# Patient Record
Sex: Male | Born: 1960 | Race: Black or African American | Hispanic: No | Marital: Married | Smoking: Never smoker
Health system: Southern US, Community
[De-identification: ages and names within clinical notes are randomized; demographics above are authoritative.]

## PROBLEM LIST (undated history)

## (undated) DIAGNOSIS — K219 Gastro-esophageal reflux disease without esophagitis: Secondary | ICD-10-CM

## (undated) DIAGNOSIS — E785 Hyperlipidemia, unspecified: Secondary | ICD-10-CM

## (undated) DIAGNOSIS — M1990 Unspecified osteoarthritis, unspecified site: Secondary | ICD-10-CM

## (undated) DIAGNOSIS — I4891 Unspecified atrial fibrillation: Secondary | ICD-10-CM

## (undated) DIAGNOSIS — I10 Essential (primary) hypertension: Secondary | ICD-10-CM

## (undated) HISTORY — DX: Essential (primary) hypertension: I10

## (undated) HISTORY — PX: HERNIA REPAIR: SHX51

## (undated) HISTORY — PX: KNEE ARTHROSCOPY: SUR90

## (undated) HISTORY — DX: Unspecified atrial fibrillation: I48.91

---

## 2000-04-20 ENCOUNTER — Emergency Department (HOSPITAL_COMMUNITY): Admission: EM | Admit: 2000-04-20 | Discharge: 2000-04-20 | Payer: Self-pay | Admitting: Emergency Medicine

## 2000-07-19 ENCOUNTER — Ambulatory Visit (HOSPITAL_COMMUNITY): Admission: RE | Admit: 2000-07-19 | Discharge: 2000-07-19 | Payer: Self-pay | Admitting: Family Medicine

## 2000-07-19 ENCOUNTER — Encounter: Payer: Self-pay | Admitting: Family Medicine

## 2000-07-21 ENCOUNTER — Encounter (HOSPITAL_COMMUNITY): Admission: RE | Admit: 2000-07-21 | Discharge: 2000-08-20 | Payer: Self-pay | Admitting: Family Medicine

## 2001-05-16 ENCOUNTER — Ambulatory Visit (HOSPITAL_COMMUNITY): Admission: RE | Admit: 2001-05-16 | Discharge: 2001-05-16 | Payer: Self-pay | Admitting: Family Medicine

## 2001-05-16 ENCOUNTER — Encounter: Payer: Self-pay | Admitting: Family Medicine

## 2001-06-02 ENCOUNTER — Ambulatory Visit (HOSPITAL_COMMUNITY): Admission: RE | Admit: 2001-06-02 | Discharge: 2001-06-02 | Payer: Self-pay | Admitting: Orthopaedic Surgery

## 2001-06-05 ENCOUNTER — Encounter (HOSPITAL_COMMUNITY): Admission: RE | Admit: 2001-06-05 | Discharge: 2001-07-05 | Payer: Self-pay | Admitting: Orthopaedic Surgery

## 2001-07-05 ENCOUNTER — Encounter (HOSPITAL_COMMUNITY): Admission: RE | Admit: 2001-07-05 | Discharge: 2001-08-04 | Payer: Self-pay | Admitting: Orthopaedic Surgery

## 2001-10-11 ENCOUNTER — Ambulatory Visit (HOSPITAL_COMMUNITY): Admission: RE | Admit: 2001-10-11 | Discharge: 2001-10-11 | Payer: Self-pay | Admitting: Family Medicine

## 2001-10-11 ENCOUNTER — Encounter: Payer: Self-pay | Admitting: Family Medicine

## 2001-11-01 ENCOUNTER — Encounter: Payer: Self-pay | Admitting: Family Medicine

## 2001-11-01 ENCOUNTER — Ambulatory Visit (HOSPITAL_COMMUNITY): Admission: RE | Admit: 2001-11-01 | Discharge: 2001-11-01 | Payer: Self-pay | Admitting: Family Medicine

## 2002-08-02 ENCOUNTER — Ambulatory Visit (HOSPITAL_BASED_OUTPATIENT_CLINIC_OR_DEPARTMENT_OTHER): Admission: RE | Admit: 2002-08-02 | Discharge: 2002-08-02 | Payer: Self-pay | Admitting: Urology

## 2002-08-16 ENCOUNTER — Encounter: Payer: Self-pay | Admitting: Urology

## 2002-08-16 ENCOUNTER — Ambulatory Visit (HOSPITAL_COMMUNITY): Admission: RE | Admit: 2002-08-16 | Discharge: 2002-08-16 | Payer: Self-pay | Admitting: Urology

## 2002-09-08 ENCOUNTER — Emergency Department (HOSPITAL_COMMUNITY): Admission: EM | Admit: 2002-09-08 | Discharge: 2002-09-08 | Payer: Self-pay | Admitting: Emergency Medicine

## 2003-06-03 ENCOUNTER — Emergency Department (HOSPITAL_COMMUNITY): Admission: EM | Admit: 2003-06-03 | Discharge: 2003-06-03 | Payer: Self-pay | Admitting: Emergency Medicine

## 2006-07-22 ENCOUNTER — Ambulatory Visit (HOSPITAL_COMMUNITY): Admission: RE | Admit: 2006-07-22 | Discharge: 2006-07-22 | Payer: Self-pay | Admitting: Family Medicine

## 2007-04-14 ENCOUNTER — Ambulatory Visit (HOSPITAL_COMMUNITY): Admission: RE | Admit: 2007-04-14 | Discharge: 2007-04-14 | Payer: Self-pay | Admitting: Family Medicine

## 2009-07-09 ENCOUNTER — Ambulatory Visit (HOSPITAL_COMMUNITY): Admission: RE | Admit: 2009-07-09 | Discharge: 2009-07-09 | Payer: Self-pay | Admitting: Family Medicine

## 2010-06-19 NOTE — Op Note (Signed)
[facility]  Patient:    Christopher [name], Christopher [name] Visit Number: [id number] MRN: [id number]          Service Type: REC Location: [facility] Attending Physician:  Windle Guard Dictated by:   Darreld Mclean, M.D. Proc. Date: 06/02/01 Admit Date:  06/05/2001                             Operative Report  PREOPERATIVE DIAGNOSIS:  Torn medial meniscus, right knee.  POSTOPERATIVE DIAGNOSES:  Torn medial meniscus, right knee, degenerative joint disease medial femoral condyle.  PROCEDURE:  Operative arthroscopy, right knee, partial medial meniscectomy.  ANESTHESIA:  General.  TOURNIQUET TIME:  23 minutes.  DRAINS: None.  SURGEON:  Darreld Mclean, M.D.  ASSISTANT:  Candace Cruise, P.A.-C.  INDICATIONS FOR PROCEDURE:  The patient is a 50 year old African-American male with pain and tenderness in his right knee giving way to pain and tenderness. MRI was positive for tear of the medial meniscus. He has not responded well to conservative treatment. Surgery was recommended and he appeared to understand and agree to the procedure as outlined.  OPERATIVE FINDINGS:  Once the knee was encountered, there were multiple small little loose particles within the knee, wattage material that was expressed when we put the inflow arthroscope in. Medially he had a tear of the medial meniscus posterior horn with significant degenerative changes in the femoral condyle medially, grade 3 changes. There was a defect in the articular surface of the femoral condyle. The tibial plateau looked good with only grade 2 changes, the anterior cruciate was intact, laterally looked normal. The meniscus looked normal and articular surfaces looked normal. There were multiple small little particles within the knee and these were removed with suction and continuous irrigation.  DESCRIPTION OF PROCEDURE:  The patient was given general anesthesia, laid supine on the operating room table. The right leg was  elevated, wrapped circumferentially with an Esmarch bandage, tourniquet inflated to 300 mmHg. Esmarch bandage removed. The inflow cannula was inserted in the medial portion of the right knee and lactated Ringers instilled into the knee. The arthroscope was inserted laterally. There were multiple small particles coming out, so with the knee entirely inflated with the lactated Ringers, I then let open the portal and let these multiple small particles come out through the larger drain. Once this was done, put the scope in and looked at the knee. Please see the findings above. ______ were taken.  Attention directed to the medial side of the knee and using a meniscal shaver punch, good smooth contour was obtained for the tear. Multiple small particles in the knee subsided as we kept irrigating the knee. The knee was systematically reexamined and no new pathology found. The arthroscope was removed. Marcaine 0.25% was instilled in each portal after each portal was reapproximated using 3-0 nylon in interrupted vertical mattress manner. A sterile dressing applied, bulky dressing, thigh tourniquet inflated for 23 minutes. The patient tolerated the procedure well and went to recovery in good condition. Appropriate analgesia given for pain. I will see in the office in approximately 10 days to 2 weeks. Physical therapy has been arranged. For any difficulties, he is to contact me through the office or hospital beeper system. Dictated by:   Darreld Mclean, M.D. Attending Physician:  Windle Guard DD:  06/02/01 TD:  06/05/01 Job: [id number]

## 2010-06-19 NOTE — Op Note (Signed)
   NAME:  ANGAD, NABERS                         ACCOUNT NO.:  [id number]   MEDICAL RECORD NO.:  [id number]                   PATIENT TYPE:  AMB   LOCATION:  [facility]                                 FACILITY:  [facility]   PHYSICIAN:  Sigmund I. Patsi Sears, M.D.         DATE OF BIRTH:  10/08/1960   DATE OF PROCEDURE:  08/02/2002  DATE OF DISCHARGE:                                 OPERATIVE REPORT   PREOPERATIVE DIAGNOSES:  1. Phimosis.  2. Balanitis.   POSTOPERATIVE DIAGNOSES:  1. Phimosis.  2. Balanitis.   OPERATION:  Circumcision.   SURGEON:  Sigmund I. Patsi Sears, M.D.   ANESTHESIA:  LMA.   PREPARATION:  After appropriate preanesthesia the patient was brought to the  operating room and placed on the operating table in a dorsal supine  position, where general LMA anesthesia was introduced.  He remained in this  position, where the penis was prepped with Betadine solution and draped in  the usual fashion.   REVIEW OF HISTORY:  Mr. Kaigler is a 50 year old black male, seen with  phimosis and balanitis and treated in the office.  He now presents for a  circumcision.   PROCEDURE:  Approximately 20 mL of Marcaine 0.25% plain is injected  circumferentially around the base of the penis at the beginning of the case.  Using a marking pen, outline of the periglandular and pericoronal incisions  is made, and each incision is then made with a knife blade.  Following  removal of the foreskin, cautery is used to obtain hemostasis and following  this, four separate quadrant sutures of 4-0 Monocryl are placed, and each  quadrant is closed with interrupted 4-0 Monocryl suture.  Following this, a  double sterile dressing is applied and the patient is awakened and taken to  the recovery room in good condition, after receiving IV Toradol.  He  tolerated the procedure well and is allowed to be discharged in good  condition.                                               Sigmund I. Patsi Sears,  M.D.    SIT/MEDQ  D:  08/02/2002  T:  08/02/2002  Job:  [id number]

## 2010-11-19 ENCOUNTER — Emergency Department (HOSPITAL_COMMUNITY)
Admission: EM | Admit: 2010-11-19 | Discharge: 2010-11-19 | Disposition: A | Discharge status: Home / Self Care | Payer: BC Managed Care – PPO | Attending: Emergency Medicine | Admitting: Emergency Medicine

## 2010-11-19 ENCOUNTER — Emergency Department (HOSPITAL_COMMUNITY): Payer: BC Managed Care – PPO

## 2010-11-19 ENCOUNTER — Encounter: Payer: Self-pay | Admitting: *Deleted

## 2010-11-19 DIAGNOSIS — M25519 Pain in unspecified shoulder: Secondary | ICD-10-CM

## 2010-11-19 MED ORDER — HYDROCODONE-ACETAMINOPHEN 5-325 MG PO TABS: 1.0000 | ORAL_TABLET | Freq: Four times a day (QID) | ORAL | Status: AC | PRN

## 2010-11-19 MED ORDER — IBUPROFEN 800 MG PO TABS
800.0000 mg | ORAL_TABLET | Freq: Once | ORAL | Status: AC
  Administered 2010-11-19: 800 mg via ORAL
  Filled 2010-11-19: qty 1

## 2010-11-19 MED ORDER — HYDROCODONE-ACETAMINOPHEN 5-325 MG PO TABS
1.0000 | ORAL_TABLET | Freq: Once | ORAL | Status: AC
  Administered 2010-11-19: 1 via ORAL
  Filled 2010-11-19: qty 1

## 2010-11-19 NOTE — ED Notes (Signed)
C/o right-sided neck pain, right shoulder pain radiating down right arm x 5 days; worse with movement.

## 2010-11-19 NOTE — ED Provider Notes (Signed)
Medical screening examination/treatment/procedure(s) were performed by non-physician practitioner and as supervising physician I was immediately available for consultation/collaboration.   Benny Lennert, MD 11/19/10 2256

## 2010-11-19 NOTE — ED Provider Notes (Signed)
History     CSN: [id number] Arrival date & time: 11/19/2010  8:16 PM   First MD Initiated Contact with Patient 11/19/10 2026      Chief Complaint  Patient presents with  . Shoulder Pain    right shoulder    (Consider location/radiation/quality/duration/timing/severity/associated sxs/prior treatment) HPI Comments: No known injury.  No known problem with his R shoulder.  Describes a "numbness and throbbing".  Patient is a 50 y.o. male presenting with shoulder pain. The history is provided by the patient. No language interpreter was used.  Shoulder Pain This is a new problem. The current episode started in the past 7 days. The problem occurs constantly. The problem has been gradually worsening. Pertinent negatives include no coughing, fever, nausea or vomiting. Exacerbated by: palpation and active ROM. He has tried nothing for the symptoms.    History reviewed. No pertinent past medical history.  Past Surgical History  Procedure Date  . Knee arthroscopy right knee    No family history on file.  History  Substance Use Topics  . Smoking status: Never Smoker   . Smokeless tobacco: Not on file  . Alcohol Use: No      Review of Systems  Constitutional: Negative for fever.  Respiratory: Negative for cough, chest tightness and shortness of breath.   Gastrointestinal: Negative for nausea and vomiting.  Musculoskeletal:       Shoulder pain  All other systems reviewed and are negative.    Allergies  Review of patient's allergies indicates no known allergies.  Home Medications   Current Outpatient Rx  Name Route Sig Dispense Refill  . ATENOLOL 25 MG PO TABS Oral Take 12.5 mg by mouth 2 (two) times daily.        BP 150/79  Pulse 62  Temp(Src) 98.3 F (36.8 C) (Oral)  Resp 16  Ht 5\' 11"  (1.803 m)  Wt 185 lb (83.915 kg)  BMI 25.80 kg/m2  SpO2 100%  Physical Exam  Nursing note and vitals reviewed. Constitutional: [name] oriented to person, place, and time. He  appears well-developed and well-nourished.  HENT:  Head: Normocephalic and atraumatic.  Eyes: EOM are normal.  Neck: Normal range of motion.  Cardiovascular: Normal rate, regular rhythm, normal heart sounds and intact distal pulses.   Pulmonary/Chest: Effort normal and breath sounds normal. No respiratory distress.  Abdominal: Soft. He exhibits no distension. There is no tenderness.  Musculoskeletal: He exhibits tenderness.       Right shoulder: He exhibits decreased range of motion, tenderness and pain. He exhibits no bony tenderness, no swelling, no effusion, no crepitus, no deformity, no laceration, no spasm, normal pulse and normal strength.       Arms: Neurological: [name] alert and oriented to person, place, and time.  Skin: Skin is warm and dry.  Psychiatric: He has a normal mood and affect. Judgment normal.    ED Course  Procedures (including critical care time)  Labs Reviewed - No data to display No results found.   No diagnosis found.    MDM  Discussed xray findings with pt and wife.  Pt is to wear the sling for comfort.  Apply ice several times daily, and take ibuprofen up to 800 mg  TID with food.  He will f/u with dr. Romeo Apple.        Worthy Rancher, PA 11/19/10 2152

## 2010-11-19 NOTE — ED Notes (Signed)
Pt already seen and assessed by EDPa.

## 2010-11-20 ENCOUNTER — Other Ambulatory Visit: Payer: Self-pay | Admitting: Family Medicine

## 2010-11-20 ENCOUNTER — Ambulatory Visit (HOSPITAL_COMMUNITY)
Admission: RE | Admit: 2010-11-20 | Discharge: 2010-11-20 | Disposition: A | Discharge status: Home / Self Care | Payer: BC Managed Care – PPO | Source: Ambulatory Visit | Attending: Family Medicine | Admitting: Family Medicine

## 2010-11-20 DIAGNOSIS — R52 Pain, unspecified: Secondary | ICD-10-CM

## 2010-11-20 DIAGNOSIS — M5030 Other cervical disc degeneration, unspecified cervical region: Secondary | ICD-10-CM | POA: Insufficient documentation

## 2010-11-20 DIAGNOSIS — M542 Cervicalgia: Secondary | ICD-10-CM | POA: Insufficient documentation

## 2011-02-09 ENCOUNTER — Other Ambulatory Visit: Payer: Self-pay | Admitting: Family Medicine

## 2011-02-09 DIAGNOSIS — M25511 Pain in right shoulder: Secondary | ICD-10-CM

## 2011-02-18 ENCOUNTER — Other Ambulatory Visit (HOSPITAL_COMMUNITY): Payer: BC Managed Care – PPO

## 2011-03-08 ENCOUNTER — Encounter: Payer: Self-pay | Admitting: Orthopedic Surgery

## 2011-03-08 ENCOUNTER — Ambulatory Visit (INDEPENDENT_AMBULATORY_CARE_PROVIDER_SITE_OTHER): Payer: BC Managed Care – PPO | Admitting: Orthopedic Surgery

## 2011-03-08 VITALS — BP 140/80 | Ht 71.0 in | Wt 186.0 lb

## 2011-03-08 DIAGNOSIS — M25819 Other specified joint disorders, unspecified shoulder: Secondary | ICD-10-CM

## 2011-03-08 DIAGNOSIS — M25519 Pain in unspecified shoulder: Secondary | ICD-10-CM

## 2011-03-08 DIAGNOSIS — M7541 Impingement syndrome of right shoulder: Secondary | ICD-10-CM

## 2011-03-08 NOTE — Patient Instructions (Signed)
You have received a steroid shot. 15% of patients experience increased pain at the injection site with in the next 24 hours. This is best treated with ice and tylenol extra strength 2 tabs every 8 hours. If you are still having pain please call the office.   Start home exercise program with 3 level bands (obtained from [employer]. Approximately $15)  Do the exercises for 6 weeks start with the low resistance band for 2 weeks and then the  medium for 2 weeks, finally  the high resistance band for 2 weeks  Take the medication as needed

## 2011-03-08 NOTE — Progress Notes (Signed)
  Subjective:    Christopher [name] is a 51 y.o. male who presents with right shoulder pain. The symptoms began several months ago. Aggravating factors: no known event. Pain is located around the deltoid . Discomfort is described as sharp/stabbing. Symptoms are exacerbated by repetitive movements, overhead movements and lying on the shoulder. Evaluation to date: C spine film: DDDC5-6. Therapy to date includes: nothing specific.vicodin, nsaid-voltaren, percocet x 2-3 months   The following portions of the patient's history were reviewed and updated as appropriate: allergies, current medications, past family history, past medical history, past social history, past surgical history and problem list.  Review of Systems A comprehensive review of systems was negative except for: Musculoskeletal: positive for myalgias   Objective:    BP 140/80  Ht 5\' 11"  (1.803 m)  Wt 186 lb (84.369 kg)  BMI 25.94 kg/m2  Physical Exam(12) GENERAL: normal development   CDV: pulses are normal   Skin: normal  Lymph: nodes were not palpable/normal  Psychiatric: awake, alert and oriented  Neuro: normal sensation  Lower extremity exam  Ambulation is normal.  Lower extremities.  Gait is normal.  There is no evidence of contracture, atrophy, subluxation, deformity.  Right shoulder: Inspection reveals no tenderness around the a.c. Joint or the glenohumeral joint.  The there is no swelling.  Range of motion external rotation is limited to 40 with his arm at his side.  Active forward elevation is limited to 110, passive range of motion 130.  Painful arc of motion 110-130.  Gross manual muscle testing however reveals internal and external rotation strength of 5 grade.  Forward elevation strength grade 5.  Positive impingement sign is noted at 120.  The shoulder is otherwise stable.  Left shoulder: normal active ROM, no tenderness, no impingement sign     Assessment:    Right impingement syndrome     Plan:      Natural history and expected course discussed. Questions answered. Agricultural engineer distributed. Gentle ROM exercises. Shoulder injection. See procedure note. Home exercise program   Shoulder Injection Procedure Note  Shoulder Injection Procedure Note   Pre-operative Diagnosis: right  RC Syndrome  Post-operative Diagnosis: same  Indications: pain   Anesthesia: ethyl chloride   Procedure Details   Verbal consent was obtained for the procedure. The shoulder was prepped withalcohol and the skin was anesthetized. A 20 gauge needle was advanced into the subacromial space through posterior approach without difficulty  The space was then injected with 3 ml 1% lidocaine and 1 ml of depomedrol. The injection site was cleansed with isopropyl alcohol and a dressing was applied.  Complications:  None; patient tolerated the procedure well.

## 2011-03-09 ENCOUNTER — Other Ambulatory Visit: Payer: Self-pay | Admitting: *Deleted

## 2011-03-09 MED ORDER — HYDROCODONE-ACETAMINOPHEN 7.5-325 MG PO TABS: 1.0000 | ORAL_TABLET | Freq: Four times a day (QID) | ORAL | Status: AC | PRN

## 2011-07-25 ENCOUNTER — Emergency Department (HOSPITAL_COMMUNITY)
Admission: EM | Admit: 2011-07-25 | Discharge: 2011-07-25 | Disposition: A | Discharge status: Home / Self Care | Payer: BC Managed Care – PPO | Attending: Emergency Medicine | Admitting: Emergency Medicine

## 2011-07-25 ENCOUNTER — Encounter (HOSPITAL_COMMUNITY): Payer: Self-pay | Admitting: Emergency Medicine

## 2011-07-25 DIAGNOSIS — W57XXXA Bitten or stung by nonvenomous insect and other nonvenomous arthropods, initial encounter: Secondary | ICD-10-CM | POA: Insufficient documentation

## 2011-07-25 DIAGNOSIS — Z809 Family history of malignant neoplasm, unspecified: Secondary | ICD-10-CM | POA: Insufficient documentation

## 2011-07-25 DIAGNOSIS — Z833 Family history of diabetes mellitus: Secondary | ICD-10-CM | POA: Insufficient documentation

## 2011-07-25 DIAGNOSIS — I10 Essential (primary) hypertension: Secondary | ICD-10-CM | POA: Insufficient documentation

## 2011-07-25 DIAGNOSIS — S30860A Insect bite (nonvenomous) of lower back and pelvis, initial encounter: Secondary | ICD-10-CM | POA: Insufficient documentation

## 2011-07-25 DIAGNOSIS — Z8261 Family history of arthritis: Secondary | ICD-10-CM | POA: Insufficient documentation

## 2011-07-25 MED ORDER — BACITRACIN ZINC 500 UNIT/GM EX OINT
TOPICAL_OINTMENT | CUTANEOUS | Status: AC
  Filled 2011-07-25: qty 0.9

## 2011-07-25 MED ORDER — BACITRACIN ZINC 500 UNIT/GM EX OINT
1.0000 "application " | TOPICAL_OINTMENT | Freq: Two times a day (BID) | CUTANEOUS | Status: DC
  Administered 2011-07-25: 1 via TOPICAL

## 2011-07-25 NOTE — ED Provider Notes (Signed)
History     CSN: [id number]  Arrival date & time 07/25/11  1119   First MD Initiated Contact with Patient 07/25/11 1141      Chief Complaint  Patient presents with  . Tick Removal    (Consider location/radiation/quality/duration/timing/severity/associated sxs/prior treatment) HPI Comments: Pt found tick imbedded near R scapula this AM.  Did not remove because he was concerned about "getting the head out".    The history is provided by the patient. No language interpreter was used.    Past Medical History  Diagnosis Date  . HTN (hypertension)     Past Surgical History  Procedure Date  . Knee arthroscopy right knee  . Hernia repair     Family History  Problem Relation Age of Onset  . Arthritis    . Cancer    . Diabetes      History  Substance Use Topics  . Smoking status: Never Smoker   . Smokeless tobacco: Not on file  . Alcohol Use: Yes     occasional beer      Review of Systems  Constitutional: Negative for fever and chills.  Musculoskeletal: Negative for myalgias and arthralgias.  Skin: Negative for rash.    Allergies  Review of patient's allergies indicates no known allergies.  Home Medications   Current Outpatient Rx  Name Route Sig Dispense Refill  . ATENOLOL 25 MG PO TABS Oral Take 12.5 mg by mouth 2 (two) times daily.       BP 147/73  Pulse 61  Temp 97.7 F (36.5 C)  Resp 18  Ht 5\' 11"  (1.803 m)  Wt 190 lb (86.183 kg)  BMI 26.50 kg/m2  SpO2 99%  Physical Exam  Nursing note and vitals reviewed. Constitutional: [name] oriented to person, place, and time. He appears well-developed and well-nourished.  HENT:  Head: Normocephalic and atraumatic.  Eyes: EOM are normal.  Neck: Normal range of motion.  Cardiovascular: Normal rate, regular rhythm, normal heart sounds and intact distal pulses.   Pulmonary/Chest: Effort normal and breath sounds normal. No respiratory distress.  Abdominal: Soft. He exhibits no distension. There is no  tenderness.  Musculoskeletal: Normal range of motion.       Back:  Neurological: [name] alert and oriented to person, place, and time.  Skin: Skin is warm and dry. No rash noted.  Psychiatric: He has a normal mood and affect. Judgment normal.    ED Course  Procedures (including critical care time)  Labs Reviewed - No data to display No results found.   No diagnosis found.    MDM  discussed taking doxycycline vs not.  Pt agrees to watch bite area and be observant for any fever., rash, joint or myalgias.  Will return as needed.        Worthy Rancher, PA 07/25/11 1224

## 2011-07-25 NOTE — ED Notes (Signed)
Pt here for tick removal from right upper back .

## 2011-07-25 NOTE — Discharge Instructions (Signed)
Insect Bite Mosquitoes, flies, fleas, bedbugs, and many other insects can bite. Insect bites are different from insect stings. A sting is when venom is injected into the skin. Some insect bites can transmit infectious diseases. SYMPTOMS  Insect bites usually turn red, swell, and itch for 2 to 4 days. They often go away on their own. TREATMENT  Your caregiver may prescribe antibiotic medicines if a bacterial infection develops in the bite. HOME CARE INSTRUCTIONS  Do not scratch the bite area.   Keep the bite area clean and dry. Wash the bite area thoroughly with soap and water.   Put ice or cool compresses on the bite area.   Put ice in a plastic bag.   Place a towel between your skin and the bag.   Leave the ice on for 20 minutes, 4 times a day for the first 2 to 3 days, or as directed.   You may apply a baking soda paste, cortisone cream, or calamine lotion to the bite area as directed by your caregiver. This can help reduce itching and swelling.   Only take over-the-counter or prescription medicines as directed by your caregiver.   If you are given antibiotics, take them as directed. Finish them even if you start to feel better.  You may need a tetanus shot if:  You cannot remember when you had your last tetanus shot.   You have never had a tetanus shot.   The injury broke your skin.  If you get a tetanus shot, your arm may swell, get red, and feel warm to the touch. This is common and not a problem. If you need a tetanus shot and you choose not to have one, there is a rare chance of getting tetanus. Sickness from tetanus can be serious. SEEK IMMEDIATE MEDICAL CARE IF:   You have increased pain, redness, or swelling in the bite area.   You see a red line on the skin coming from the bite.   You have a fever.   You have joint pain.   You have a headache or neck pain.   You have unusual weakness.   You have a rash.   You have chest pain or shortness of breath.   You  have abdominal pain, nausea, or vomiting.   You feel unusually tired or sleepy.  MAKE SURE YOU:   Understand these instructions.   Will watch your condition.   Will get help right away if you are not doing well or get worse.  Document Released: 02/26/2004 Document Revised: 01/07/2011 Document Reviewed: 08/19/2010 [facility] Patient Information 2012 [place], [place].   Keep the tick bite area clean and watch for any signs of secondary infection.  Mark a calendar to indicate when the tick was removed.  Return or see your MD if you note any fever, chills, rash, joint or body pain.

## 2011-07-27 NOTE — ED Provider Notes (Signed)
Medical screening examination/treatment/procedure(s) were performed by non-physician practitioner and as supervising physician I was immediately available for consultation/collaboration.   Shelda Jakes, MD 07/27/11 1026

## 2013-02-09 ENCOUNTER — Other Ambulatory Visit: Payer: Self-pay | Admitting: Family Medicine

## 2013-04-01 ENCOUNTER — Other Ambulatory Visit: Payer: Self-pay | Admitting: Family Medicine

## 2013-04-12 ENCOUNTER — Encounter (HOSPITAL_COMMUNITY): Payer: Self-pay | Admitting: Emergency Medicine

## 2013-04-12 ENCOUNTER — Emergency Department (HOSPITAL_COMMUNITY): Payer: 59

## 2013-04-12 ENCOUNTER — Emergency Department (HOSPITAL_COMMUNITY)
Admission: EM | Admit: 2013-04-12 | Discharge: 2013-04-13 | Disposition: A | Discharge status: Home / Self Care | Payer: 59 | Attending: Emergency Medicine | Admitting: Emergency Medicine

## 2013-04-12 DIAGNOSIS — R079 Chest pain, unspecified: Secondary | ICD-10-CM

## 2013-04-12 DIAGNOSIS — R0789 Other chest pain: Secondary | ICD-10-CM | POA: Insufficient documentation

## 2013-04-12 DIAGNOSIS — Z79899 Other long term (current) drug therapy: Secondary | ICD-10-CM | POA: Insufficient documentation

## 2013-04-12 DIAGNOSIS — I10 Essential (primary) hypertension: Secondary | ICD-10-CM | POA: Insufficient documentation

## 2013-04-12 LAB — CBC WITH DIFFERENTIAL/PLATELET
BASOS ABS: 0 10*3/uL (ref 0.0–0.1)
Basophils Relative: 1 % (ref 0–1)
Eosinophils Absolute: 0.1 10*3/uL (ref 0.0–0.7)
Eosinophils Relative: 2 % (ref 0–5)
HEMATOCRIT: 41.5 % (ref 39.0–52.0)
Hemoglobin: 14.6 g/dL (ref 13.0–17.0)
LYMPHS PCT: 37 % (ref 12–46)
Lymphs Abs: 1.8 10*3/uL (ref 0.7–4.0)
MCH: 30.7 pg (ref 26.0–34.0)
MCHC: 35.2 g/dL (ref 30.0–36.0)
MCV: 87.2 fL (ref 78.0–100.0)
MONO ABS: 0.4 10*3/uL (ref 0.1–1.0)
Monocytes Relative: 9 % (ref 3–12)
NEUTROS ABS: 2.5 10*3/uL (ref 1.7–7.7)
Neutrophils Relative %: 51 % (ref 43–77)
PLATELETS: 201 10*3/uL (ref 150–400)
RBC: 4.76 MIL/uL (ref 4.22–5.81)
RDW: 12.5 % (ref 11.5–15.5)
WBC: 4.8 10*3/uL (ref 4.0–10.5)

## 2013-04-12 LAB — BASIC METABOLIC PANEL
BUN: 19 mg/dL (ref 6–23)
CHLORIDE: 102 meq/L (ref 96–112)
CO2: 28 meq/L (ref 19–32)
Calcium: 9.1 mg/dL (ref 8.4–10.5)
Creatinine, Ser: 1.14 mg/dL (ref 0.50–1.35)
GFR calc Af Amer: 84 mL/min — ABNORMAL LOW (ref >90)
GFR calc non Af Amer: 73 mL/min — ABNORMAL LOW (ref >90)
Glucose, Bld: 88 mg/dL (ref 70–99)
Potassium: 3.7 mEq/L (ref 3.7–5.3)
SODIUM: 141 meq/L (ref 137–147)

## 2013-04-12 LAB — TROPONIN I

## 2013-04-12 MED ORDER — HYDROCODONE-ACETAMINOPHEN 5-325 MG PO TABS
1.0000 | ORAL_TABLET | Freq: Once | ORAL | Status: AC
  Administered 2013-04-12: 1 via ORAL
  Filled 2013-04-12: qty 1

## 2013-04-12 MED ORDER — HYDROCODONE-ACETAMINOPHEN 5-325 MG PO TABS: 1.0000 | ORAL_TABLET | Freq: Four times a day (QID) | ORAL | Status: DC | PRN

## 2013-04-12 NOTE — ED Notes (Signed)
Patient reports left-sided chest pain x 1 week. Denies other symptoms.

## 2013-04-12 NOTE — Discharge Instructions (Signed)
Follow up with your md next week.  Return as needed °

## 2013-04-12 NOTE — ED Notes (Signed)
Pt c/o chest pain x 1 week. States it feeling like someone is sitting on chest and radiates to L. Shoulder.  Rates pain as 8/10. Nothing makes worse or better per pt. No sob noted. Pt bradycardic with HR 44-49. Dr. Roderic Palau made aware.

## 2013-04-12 NOTE — ED Provider Notes (Signed)
CSN: [id number]     Arrival date & time 04/12/13  2033 History  This chart was scribed for Maudry Diego, MD by Jenne Campus, ED Scribe. This patient was seen in room APA12/APA12 and the patient's care was started at 9:34 PM.   Chief Complaint  Patient presents with  . Chest Pain     Patient is a 53 y.o. male presenting with chest pain. The history is provided by the patient. No language interpreter was used.  Chest Pain Pain location:  L chest Pain quality: aching   Duration:  1 week Timing:  Constant Progression:  Waxing and waning Chronicity:  New Relieved by:  Nothing Worsened by:  Nothing tried Ineffective treatments: ibuprofen. Associated symptoms: no abdominal pain, no back pain, no cough, no fatigue, no headache, no nausea and no shortness of breath   Risk factors: no smoking    HPI Comments: Christopher [name] is a 54 y.o. male who presents to the Emergency Department complaining of left-sided CP that has been constant for the past week. He describes the pain as a waxing and waning ache. He admits to having pain currently and states that he has tried taking 2 IBU with no improvement He denies having any modifying factors including exertion and deep breathing. He denies any associated diaphoresis, nausea or SOB.  He denies smoking. He has a family h/o cardiac problems with his father at a younger age but is unable to specify the age when his father began to have trouble.  PCP is Dr. Wolfgang Phoenix.  Past Medical History  Diagnosis Date  . HTN (hypertension)    Past Surgical History  Procedure Laterality Date  . Knee arthroscopy  right knee  . Hernia repair     Family History  Problem Relation Age of Onset  . Arthritis    . Cancer    . Diabetes     History  Substance Use Topics  . Smoking status: Never Smoker   . Smokeless tobacco: Not on file  . Alcohol Use: Yes     Comment: occasional beer    Review of Systems  Constitutional: Negative for appetite change and  fatigue.  HENT: Negative for congestion, ear discharge and sinus pressure.   Eyes: Negative for discharge.  Respiratory: Negative for cough and shortness of breath.   Cardiovascular: Positive for chest pain.  Gastrointestinal: Negative for nausea, abdominal pain and diarrhea.  Genitourinary: Negative for frequency and hematuria.  Musculoskeletal: Negative for back pain.  Skin: Negative for rash.  Allergic/Immunologic: Negative for environmental allergies.  Neurological: Negative for seizures and headaches.  Psychiatric/Behavioral: Negative for hallucinations.      Allergies  Review of patient's allergies indicates no known allergies.  Home Medications   Current Outpatient Rx  Name  Route  Sig  Dispense  Refill  . atenolol (TENORMIN) 25 MG tablet   Oral   Take 12.5 mg by mouth 2 (two) times daily.          [name] atenolol (TENORMIN) 50 MG tablet      TAKE ONE HALF TABLET BY MOUTH TWICE DAILY   15 tablet   0     *NEEDS OFFICE VISIT*    Triage Vitals: BP 144/85  Pulse 53  Temp(Src) 98.2 F (36.8 C) (Oral)  Resp 16  Ht 5\' 11"  (1.803 m)  Wt 193 lb (87.544 kg)  BMI 26.93 kg/m2  SpO2 100%  Physical Exam  Constitutional: [name] oriented to person, place, and time. He appears well-developed.  HENT:  Head: Normocephalic.  Eyes: Conjunctivae and EOM are normal. No scleral icterus.  Neck: Neck supple. No thyromegaly present.  Cardiovascular: Normal rate and regular rhythm.  Exam reveals no gallop and no friction rub.   No murmur heard. Pulmonary/Chest: No stridor. He has no wheezes. He has no rales. He exhibits no tenderness.  Abdominal: He exhibits no distension. There is no tenderness. There is no rebound.  Musculoskeletal: Normal range of motion. He exhibits no edema.  Lymphadenopathy:    He has no cervical adenopathy.  Neurological: [name] oriented to person, place, and time. He exhibits normal muscle tone. Coordination normal.  Skin: No rash noted. No erythema.   Psychiatric: He has a normal mood and affect. His behavior is normal.    ED Course  Procedures (including critical care time)  Medications  HYDROcodone-acetaminophen (NORCO/VICODIN) 5-325 MG per tablet 1 tablet (not administered)    DIAGNOSTIC STUDIES: Oxygen Saturation is 100% on RA, normal by my interpretation.    COORDINATION OF CARE: 9:38 PM-Discussed treatment plan which includes pain medication, CXR, CBC panel, BMP and troponin with pt at bedside and pt agreed to plan.   Labs Review Labs Reviewed  CBC WITH DIFFERENTIAL  BASIC METABOLIC PANEL  TROPONIN I   Imaging Review No results found.   EKG Interpretation   Date/Time:  Thursday April 12 2013 21:24:28 EDT Ventricular Rate:  48 PR Interval:  190 QRS Duration: 90 QT Interval:  446 QTC Calculation: 398 R Axis:   50 Text Interpretation:  Sinus bradycardia Otherwise normal ECG When compared  with ECG of 02-Aug-2002 08:27, No significant change was found Confirmed  by Anasia Agro  MD, Josearmando Kuhnert ([id number]) on 04/12/2013 11:21:41 PM      MDM   Final diagnoses:  None  chest pain improved.  Pain not worse with exertions or breathing.  Non cardiac chest pain.  Pt to follow up with pcp The chart was scribed for me under my direct supervision.  I personally performed the history, physical, and medical decision making and all procedures in the evaluation of this patient.Maudry Diego, MD 04/12/13 2329

## 2013-04-16 ENCOUNTER — Encounter: Payer: Self-pay | Admitting: Family Medicine

## 2013-04-16 ENCOUNTER — Ambulatory Visit (INDEPENDENT_AMBULATORY_CARE_PROVIDER_SITE_OTHER): Payer: PRIVATE HEALTH INSURANCE | Admitting: Family Medicine

## 2013-04-16 VITALS — BP 128/80 | Ht 71.5 in | Wt 180.0 lb

## 2013-04-16 DIAGNOSIS — E785 Hyperlipidemia, unspecified: Secondary | ICD-10-CM

## 2013-04-16 DIAGNOSIS — I10 Essential (primary) hypertension: Secondary | ICD-10-CM

## 2013-04-16 DIAGNOSIS — R079 Chest pain, unspecified: Secondary | ICD-10-CM

## 2013-04-16 DIAGNOSIS — Z79899 Other long term (current) drug therapy: Secondary | ICD-10-CM

## 2013-04-16 NOTE — Progress Notes (Signed)
   Subjective:    Patient ID: Christopher [name], male    DOB: Oct 26, 1960, 53 y.o.   MRN: [id number]  HPI Patient is here today because he was seen at [facility] ED on 04/12/13 for chest pain. Patient states that his symptoms have improved a lot since this visit. EKG and labs were done and came back normal at the ED. No other concerns noted at this time.  Patient relates chest pain left-sided chest. Hurts with certain movements. But at the same time states when he is doing strenuous work he feels a tightness in that area he does have a history of a very elevated LDL in the past he was advised to be on Lipitor did not follow through with doing lab work or medication. Patient does not smoke. Does have family history of heart disease. ER records labs EKG all reviewed Review of Systems Denies vomiting diarrhea fever chills sweats    Objective:   Physical Exam Lungs clear hearts regular rate is in the 50s to low 60s due to beta blocker blood pressure good extremities no edema       Assessment & Plan:  Chest pain-low likelihood of being cardiac but I do believe that due to risk factors this patient needs stress test he would need to be off of beta blocker before the stress test first to see cardiology then they can decide on what testing would be best  Hyperlipidemia he will check his lab work I told the patient that if it is elevated I am recommending cholesterol medications.

## 2013-04-17 LAB — HEPATIC FUNCTION PANEL
ALBUMIN: 4.1 g/dL (ref 3.5–5.2)
ALT: 12 U/L (ref 0–53)
AST: 14 U/L (ref 0–37)
Alkaline Phosphatase: 72 U/L (ref 39–117)
Bilirubin, Direct: 0.1 mg/dL (ref 0.0–0.3)
Indirect Bilirubin: 0.4 mg/dL (ref 0.2–1.2)
TOTAL PROTEIN: 6.9 g/dL (ref 6.0–8.3)
Total Bilirubin: 0.5 mg/dL (ref 0.2–1.2)

## 2013-04-17 LAB — LIPID PANEL
CHOL/HDL RATIO: 4.6 ratio
CHOLESTEROL: 240 mg/dL — AB (ref 0–200)
HDL: 52 mg/dL (ref >39)
LDL Cholesterol: 146 mg/dL — ABNORMAL HIGH (ref 0–99)
TRIGLYCERIDES: 211 mg/dL — AB (ref <150)
VLDL: 42 mg/dL — ABNORMAL HIGH (ref 0–40)

## 2013-04-27 ENCOUNTER — Other Ambulatory Visit: Payer: Self-pay | Admitting: Family Medicine

## 2013-05-02 ENCOUNTER — Other Ambulatory Visit: Payer: Self-pay | Admitting: *Deleted

## 2013-05-02 ENCOUNTER — Telehealth: Payer: Self-pay | Admitting: *Deleted

## 2013-05-02 MED ORDER — ATORVASTATIN CALCIUM 10 MG PO TABS: 10.0000 mg | ORAL_TABLET | Freq: Every day | ORAL | Status: DC

## 2013-05-02 NOTE — Telephone Encounter (Signed)
Patient notified and verbalized understanding of the test results. No further questions. Med sent and lab orders are in.

## 2013-05-02 NOTE — Addendum Note (Signed)
Addended byCharolotte Capuchin D on: 05/02/2013 01:50 PM   Modules accepted: Orders, Medications

## 2013-05-02 NOTE — Progress Notes (Signed)
Patient notified and verbalized understanding of the test results. No further questions. 

## 2013-05-09 ENCOUNTER — Ambulatory Visit: Payer: BC Managed Care – PPO | Admitting: Cardiology

## 2013-05-09 ENCOUNTER — Ambulatory Visit (INDEPENDENT_AMBULATORY_CARE_PROVIDER_SITE_OTHER): Payer: 59 | Admitting: Cardiology

## 2013-05-09 ENCOUNTER — Encounter: Payer: Self-pay | Admitting: Cardiology

## 2013-05-09 VITALS — BP 126/79 | HR 61 | Ht 71.0 in | Wt 177.0 lb

## 2013-05-09 DIAGNOSIS — R0989 Other specified symptoms and signs involving the circulatory and respiratory systems: Secondary | ICD-10-CM

## 2013-05-09 DIAGNOSIS — R079 Chest pain, unspecified: Secondary | ICD-10-CM

## 2013-05-09 NOTE — Progress Notes (Signed)
Clinical Summary Christopher [name] is a 53 y.o.male seen today as a new patient for chest pain.   1. Chest pain - previous ER visit 04/2013 with chest pain, thought to be non-cardiac - Trop neg, EKG with NSR and no acute changes. CXR no acute process  - symptoms started in March of this year. Pressure in mid chest and left arm, 8/10. Occurred at rest. No other associated symptoms. Worst with deep breath and position. Lasted for 3-4 hours. Occurred on and off for a week, total 4-5 times. Still having some mild episodes that aren't as severe. - notes some DOE with activities he was previously able to do comfortably, example walking uphill in his neihborhood  CAD: HTN, HL, father MI 23s. Father also with pacemaker.    2. Numbness right side - describes epsiode of right arm weakness, occurred at grocery while carrying basket in right arm. Lasted for just a few minutes. No LE weakness, no facial droop or slurred speech. Lasted approx < 1 min.   Past Medical History  Diagnosis Date  . HTN (hypertension)      No Known Allergies   Current Outpatient Prescriptions  Medication Sig Dispense Refill  . atenolol (TENORMIN) 50 MG tablet TAKE ONE HALF TABLET BY MOUTH TWICE DAILY  30 tablet  5  . atorvastatin (LIPITOR) 10 MG tablet Take 1 tablet (10 mg total) by mouth daily.  30 tablet  4  . HYDROcodone-acetaminophen (NORCO/VICODIN) 5-325 MG per tablet Take 1 tablet by mouth every 6 (six) hours as needed for moderate pain.  20 tablet  0  . ibuprofen (ADVIL,MOTRIN) 200 MG tablet Take 400 mg by mouth daily as needed for headache or moderate pain.       No current facility-administered medications for this visit.     Past Surgical History  Procedure Laterality Date  . Knee arthroscopy  right knee  . Hernia repair       No Known Allergies    Family History  Problem Relation Age of Onset  . Arthritis    . Cancer    . Diabetes       Social History Christopher [name] reports that he has  never smoked. He does not have any smokeless tobacco history on file. Christopher [name] reports that he drinks alcohol.   Review of Systems CONSTITUTIONAL: No weight loss, fever, chills, weakness or fatigue.  HEENT: Eyes: No visual loss, blurred vision, double vision or yellow sclerae.No hearing loss, sneezing, congestion, runny nose or sore throat.  SKIN: No rash or itching.  CARDIOVASCULAR: per HPI RESPIRATORY: No shortness of breath, cough or sputum.  GASTROINTESTINAL: No anorexia, nausea, vomiting or diarrhea. No abdominal pain or blood.  GENITOURINARY: No burning on urination, no polyuria NEUROLOGICAL: per HPI MUSCULOSKELETAL: No muscle, back pain, joint pain or stiffness.  LYMPHATICS: No enlarged nodes. No history of splenectomy.  PSYCHIATRIC: No history of depression or anxiety.  ENDOCRINOLOGIC: No reports of sweating, cold or heat intolerance. No polyuria or polydipsia.  [name]   Physical Examination Filed Vitals:   05/09/13 1253  BP: 126/79  Pulse: 61   Filed Weights   05/09/13 1253  Weight: 177 lb (80.287 kg)    Gen: resting comfortably, no acute distress HEENT: no scleral icterus, pupils equal round and reactive, no palptable cervical adenopathy,  CV: RRR, no m/r/g, no JVD, +right sided bruit Resp: Clear to auscultation bilaterally GI: abdomen is soft, non-tender, non-distended, normal bowel sounds, no hepatosplenomegaly MSK: extremities are warm, no  edema.  Skin: warm, no rash Neuro:  no focal deficits Psych: appropriate affect     Assessment and Plan   1. Chest pain - will obtain GXT to evaluate for ischemia and risk stratify - continue risk factor modification - check echo in setting of chest pain and DOE  2. Right sided numbness - refer to neuro - start ASA 81mg  daily  3. Carotid bruit - obtain carotid US  F/u 1 month  Arnoldo Lenis, M.D., F.A.C.C.

## 2013-05-09 NOTE — Patient Instructions (Signed)
Your physician recommends that you schedule a follow-up appointment in: 1 month  Your physician has requested that you have an echocardiogram. Echocardiography is a painless test that uses sound waves to create images of your heart. It provides your doctor with information about the size and shape of your heart and how well your heart's chambers and valves are working. This procedure takes approximately one hour. There are no restrictions for this procedure.  Your physician has requested that you have an exercise tolerance test. For further information please visit [email or web address]. Please also follow instruction sheet, as given.  Your physician has requested that you have a carotid duplex. This test is an ultrasound of the carotid arteries in your neck. It looks at blood flow through these arteries that supply the brain with blood. Allow one hour for this exam. There are no restrictions or special instructions.  Please start Asprin 81 mg daily  You have been referred to a neurologist. Dr Merlene Laughter

## 2013-05-14 ENCOUNTER — Ambulatory Visit (HOSPITAL_COMMUNITY)
Admission: RE | Admit: 2013-05-14 | Discharge: 2013-05-14 | Disposition: A | Discharge status: Home / Self Care | Payer: 59 | Source: Ambulatory Visit | Attending: Cardiology | Admitting: Cardiology

## 2013-05-14 DIAGNOSIS — I10 Essential (primary) hypertension: Secondary | ICD-10-CM | POA: Insufficient documentation

## 2013-05-14 DIAGNOSIS — I658 Occlusion and stenosis of other precerebral arteries: Secondary | ICD-10-CM | POA: Insufficient documentation

## 2013-05-14 DIAGNOSIS — Z8249 Family history of ischemic heart disease and other diseases of the circulatory system: Secondary | ICD-10-CM | POA: Insufficient documentation

## 2013-05-14 DIAGNOSIS — I379 Nonrheumatic pulmonary valve disorder, unspecified: Secondary | ICD-10-CM

## 2013-05-14 DIAGNOSIS — R079 Chest pain, unspecified: Secondary | ICD-10-CM

## 2013-05-14 DIAGNOSIS — E785 Hyperlipidemia, unspecified: Secondary | ICD-10-CM | POA: Insufficient documentation

## 2013-05-14 DIAGNOSIS — R0989 Other specified symptoms and signs involving the circulatory and respiratory systems: Secondary | ICD-10-CM | POA: Insufficient documentation

## 2013-05-15 ENCOUNTER — Ambulatory Visit (HOSPITAL_COMMUNITY): Admission: RE | Admit: 2013-05-15 | Payer: 59 | Source: Ambulatory Visit

## 2013-05-15 ENCOUNTER — Inpatient Hospital Stay (HOSPITAL_COMMUNITY): Admission: RE | Admit: 2013-05-15 | Payer: 59 | Source: Ambulatory Visit

## 2013-05-21 ENCOUNTER — Encounter (HOSPITAL_COMMUNITY): Payer: 59

## 2013-05-23 ENCOUNTER — Encounter (HOSPITAL_COMMUNITY): Payer: Self-pay

## 2013-05-23 ENCOUNTER — Ambulatory Visit (HOSPITAL_COMMUNITY)
Admission: RE | Admit: 2013-05-23 | Discharge: 2013-05-23 | Disposition: A | Discharge status: Home / Self Care | Payer: 59 | Source: Ambulatory Visit | Attending: Cardiology | Admitting: Cardiology

## 2013-05-23 ENCOUNTER — Telehealth: Payer: Self-pay | Admitting: *Deleted

## 2013-05-23 DIAGNOSIS — R079 Chest pain, unspecified: Secondary | ICD-10-CM

## 2013-05-23 NOTE — Telephone Encounter (Signed)
Made pt aware of normal stress test.

## 2013-05-23 NOTE — Progress Notes (Addendum)
Stress Lab Nurses Notes - [facility] 05/23/2013 Reason for doing test: Chest Pain Type of test: Regular GTX Nurse performing test: Gerrit Halls, RN Nuclear Medicine Tech: Not Applicable Echo Tech: Not Applicable MD performing test: Branch/ M.Bonnell Public PA Family MD: Sallee Lange Test explained and consent signed: yes IV started: No IV started Symptoms: None Treatment/Intervention: None Reason test stopped: fatigue and reached target HR After recovery IV was: NA Patient to return to [place]. Med at : NA Patient discharged: Home Patient's Condition upon discharge was: stable Comments: During test peak BP 220/85 & HR 148. (Note Atenolol not taken for 24 hr) Recovery BP 171/97 & HR 68.  Symptoms resolved in recovery. Donnajean Lopes  Attending Note Patient exercised according to the Bruce protocol for 12 minutes achieving a workload of 13.4 METs. Heart increased from 47 bpm up to 150 bpm (88% of THR). Blood pressure increased from 146/80 up to 220/85. The test was stopped due to fatigue, the patient did not experience any chest pain. Baseline EKG showed NSR. There were no ischemic EKG changes and no significant arrhythmias during stress.    Conclusions 1. Negative exercise stress test for ischemia 2. Duke treadmill score of 12, consistent with low risk for major cardiac events 3. Excellent exercise functional capacity (140% of predicted based on age and gender)   Carlyle Dolly MD

## 2013-05-23 NOTE — Telephone Encounter (Signed)
Message copied by Truett Mainland on Wed May 23, 2013  3:24 PM ------      Message from: [place] F      Created: Wed May 23, 2013  1:16 PM       Please let patient know that his stress test was normal                  Carlyle Dolly MD ------

## 2013-05-25 ENCOUNTER — Encounter (HOSPITAL_COMMUNITY): Payer: Self-pay | Admitting: Emergency Medicine

## 2013-05-25 ENCOUNTER — Emergency Department (HOSPITAL_COMMUNITY)
Admission: EM | Admit: 2013-05-25 | Discharge: 2013-05-25 | Disposition: A | Discharge status: Home / Self Care | Payer: 59 | Attending: Emergency Medicine | Admitting: Emergency Medicine

## 2013-05-25 DIAGNOSIS — W1809XA Striking against other object with subsequent fall, initial encounter: Secondary | ICD-10-CM | POA: Insufficient documentation

## 2013-05-25 DIAGNOSIS — S61419A Laceration without foreign body of unspecified hand, initial encounter: Secondary | ICD-10-CM

## 2013-05-25 DIAGNOSIS — Z7982 Long term (current) use of aspirin: Secondary | ICD-10-CM | POA: Insufficient documentation

## 2013-05-25 DIAGNOSIS — Y9389 Activity, other specified: Secondary | ICD-10-CM | POA: Insufficient documentation

## 2013-05-25 DIAGNOSIS — I10 Essential (primary) hypertension: Secondary | ICD-10-CM | POA: Insufficient documentation

## 2013-05-25 DIAGNOSIS — S61409A Unspecified open wound of unspecified hand, initial encounter: Secondary | ICD-10-CM | POA: Insufficient documentation

## 2013-05-25 DIAGNOSIS — Z79899 Other long term (current) drug therapy: Secondary | ICD-10-CM | POA: Insufficient documentation

## 2013-05-25 DIAGNOSIS — Y929 Unspecified place or not applicable: Secondary | ICD-10-CM | POA: Insufficient documentation

## 2013-05-25 MED ORDER — LIDOCAINE HCL (PF) 1 % IJ SOLN
5.0000 mL | Freq: Once | INTRAMUSCULAR | Status: AC
  Administered 2013-05-25: 5 mL
  Filled 2013-05-25: qty 5

## 2013-05-25 NOTE — ED Provider Notes (Signed)
CSN: [id number]     Arrival date & time 05/25/13  1933 History   First MD Initiated Contact with Patient 05/25/13 2053     Chief Complaint  Patient presents with  . Extremity Laceration     (Consider location/radiation/quality/duration/timing/severity/associated sxs/prior Treatment) Patient is a 53 y.o. Christopher [name] presenting with skin laceration. The history is provided by the patient.  Laceration Location:  Hand Hand laceration location:  L palm Length (cm):  2.3 Depth:  Cutaneous Quality: straight   Bleeding: controlled   Time since incident:  3 hours Laceration mechanism:  Metal edge Pain details:    Quality:  Sharp   Severity:  Moderate   Timing:  Intermittent   Progression:  Unchanged Foreign body present:  No foreign bodies Relieved by:  Pressure Worsened by:  Movement Tetanus status:  Up to date   Past Medical History  Diagnosis Date  . HTN (hypertension)    Past Surgical History  Procedure Laterality Date  . Knee arthroscopy  right knee  . Hernia repair     Family History  Problem Relation Age of Onset  . Arthritis    . Cancer    . Diabetes     History  Substance Use Topics  . Smoking status: Never Smoker   . Smokeless tobacco: Not on file  . Alcohol Use: Yes     Comment: occasional beer    Review of Systems  Constitutional: Negative for activity change.       All ROS Neg except as noted in HPI  HENT: Negative for nosebleeds.   Eyes: Negative for photophobia and discharge.  Respiratory: Negative for cough, shortness of breath and wheezing.   Cardiovascular: Negative for chest pain and palpitations.  Gastrointestinal: Negative for abdominal pain and blood in stool.  Genitourinary: Negative for dysuria, frequency and hematuria.  Musculoskeletal: Positive for arthralgias. Negative for back pain and neck pain.  Skin: Negative.   Neurological: Negative for dizziness, seizures and speech difficulty.  Psychiatric/Behavioral: Negative for hallucinations and  confusion.      Allergies  Review of patient's allergies indicates no known allergies.  Home Medications   Prior to Admission medications   Medication Sig Start Date End Date Taking? Authorizing Provider  aspirin 325 MG tablet Take 325 mg by mouth every other day.   Yes Historical Provider, MD  atenolol (TENORMIN) 50 MG tablet Take 25 mg by mouth daily.   Yes Historical Provider, MD  atorvastatin (LIPITOR) 10 MG tablet Take 1 tablet (10 mg total) by mouth daily. 05/02/13  Yes Kathyrn Drown, MD   BP 137/75  Pulse 67  Temp(Src) 98.4 F (36.9 C)  Resp 20  Ht 5\' 11"  (1.803 m)  Wt 185 lb (83.915 kg)  BMI 25.81 kg/m2  SpO2 98% Physical Exam  Nursing note and vitals reviewed. Constitutional: [name] oriented to person, place, and time. He appears well-developed and well-nourished.  Non-toxic appearance.  HENT:  Head: Normocephalic.  Right Ear: Tympanic membrane and external ear normal.  Left Ear: Tympanic membrane and external ear normal.  Eyes: EOM and lids are normal. Pupils are equal, round, and reactive to light.  Neck: Normal range of motion. Neck supple. Carotid bruit is not present.  Cardiovascular: Normal rate, regular rhythm, normal heart sounds, intact distal pulses and normal pulses.   Pulmonary/Chest: Breath sounds normal. No respiratory distress.  Abdominal: Soft. Bowel sounds are normal. There is no tenderness. There is no guarding.  Musculoskeletal: Normal range of motion.  Left hand: He exhibits laceration.       Hands: Lymphadenopathy:       Head (right side): No submandibular adenopathy present.       Head (left side): No submandibular adenopathy present.    He has no cervical adenopathy.  Neurological: [name] alert and oriented to person, place, and time. He has normal strength. No cranial nerve deficit or sensory deficit.  Skin: Skin is warm and dry.  Psychiatric: He has a normal mood and affect. His speech is normal.    ED Course  LACERATION  REPAIR Date/Time: 05/25/2013 10:25 PM Performed by: Lenox Ahr Authorized by: Lenox Ahr Consent: Verbal consent obtained. Risks and benefits: risks, benefits and alternatives were discussed Consent given by: patient Patient understanding: patient states understanding of the procedure being performed Patient identity confirmed: verbally with patient and arm band Time out: Immediately prior to procedure a "time out" was called to verify the correct patient, procedure, equipment, support staff and site/side marked as required. Body area: upper extremity Location details: left hand Laceration length: 2.3 cm Foreign bodies: no foreign bodies Tendon involvement: none Nerve involvement: none Vascular damage: no Anesthesia: local infiltration Local anesthetic: lidocaine 1% without epinephrine Patient sedated: no Preparation: Patient was prepped and draped in the usual sterile fashion. Irrigation solution: saline Amount of cleaning: standard Skin closure: 4-0 nylon Number of sutures: 6 Technique: simple Approximation: close Dressing: gauze roll Patient tolerance: Patient tolerated the procedure well with no immediate complications.   (including critical care time) Labs Review Labs Reviewed - No data to display  Imaging Review No results found.   EKG Interpretation None      MDM Pt sustained a laceration to the left hand/wrist working on his car. No neurovascular compromise. Wound repaired witout problem. Pt to have sutures removed in 7 or 8 days. He will return if any signs of infection.   Final diagnoses:  None    **I have reviewed nursing notes, vital signs, and all appropriate lab and imaging results for this patient.Lenox Ahr, PA-C 05/26/13 1552

## 2013-05-25 NOTE — Discharge Instructions (Signed)
Please keep your wound clean and dry. Please use a Bag and rubber band or twisty tie for the shower. Please have the sutures removed in 7 or 8 days. Please see your doctor, or return to the emergency department if any signs of infection. Please change the dressing daily.

## 2013-05-25 NOTE — ED Notes (Signed)
Working on car, L hand slipped hitting manifold and cutting palm at base of thumb.  No acute bleeding noted at present.

## 2013-05-28 ENCOUNTER — Telehealth: Payer: Self-pay | Admitting: Family Medicine

## 2013-05-28 MED ORDER — TRAMADOL HCL 50 MG PO TABS: 50.0000 mg | ORAL_TABLET | Freq: Four times a day (QID) | ORAL | Status: DC | PRN

## 2013-05-28 NOTE — Telephone Encounter (Signed)
Tramadol 50 mg, one every 6 hours when necessary severe pain, cautioned drowsiness, #20

## 2013-05-28 NOTE — Telephone Encounter (Signed)
NTC- we can do short script of Hydrocodone or Tramadol, plus rec sutures to be removed within 8 to 10 days of putting them in

## 2013-05-28 NOTE — Telephone Encounter (Signed)
Pt would like to try Tramadol please

## 2013-05-28 NOTE — Telephone Encounter (Signed)
Patient notified

## 2013-05-28 NOTE — Telephone Encounter (Signed)
Pt states he went to ER [facility] 4/24) with cut to his hand Has stitches and staples  They did not give him any pain medication for this  Has been taking advil over the weekend to try an help   Christopher [name]

## 2013-05-30 NOTE — ED Provider Notes (Signed)
Medical screening examination/treatment/procedure(s) were performed by non-physician practitioner and as supervising physician I was immediately available for consultation/collaboration.   EKG Interpretation None      Hafiz Irion, MD, FACEP   Sayre Witherington L Anelly Samarin, MD 05/30/13 1508 

## 2013-06-04 ENCOUNTER — Ambulatory Visit: Payer: PRIVATE HEALTH INSURANCE | Admitting: Family Medicine

## 2013-06-09 ENCOUNTER — Other Ambulatory Visit: Payer: Self-pay | Admitting: Family Medicine

## 2013-06-19 ENCOUNTER — Encounter: Payer: Self-pay | Admitting: Cardiology

## 2013-06-19 ENCOUNTER — Ambulatory Visit (INDEPENDENT_AMBULATORY_CARE_PROVIDER_SITE_OTHER): Payer: 59 | Admitting: Cardiology

## 2013-06-19 VITALS — BP 144/80 | HR 50 | Ht 71.0 in | Wt 179.0 lb

## 2013-06-19 DIAGNOSIS — R531 Weakness: Secondary | ICD-10-CM

## 2013-06-19 DIAGNOSIS — R079 Chest pain, unspecified: Secondary | ICD-10-CM

## 2013-06-19 DIAGNOSIS — R5381 Other malaise: Secondary | ICD-10-CM

## 2013-06-19 DIAGNOSIS — R5383 Other fatigue: Secondary | ICD-10-CM

## 2013-06-19 NOTE — Progress Notes (Signed)
Clinical Summary Christopher [name] is a 53 y.o.male seen today for follow up of the following medical problems.   1. Chest pain  - previous ER visit 04/2013 with chest pain, thought to be non-cardiac  - Trop neg, EKG with NSR and no acute changes. CXR no acute process  CAD: HTN, HL, father MI 74s.  - denies any recent symptoms since our last visit - he completed a GXT that was negative for ischemia, with excellent functional capacity. Echo did not show any significant pathology.   2. Numbness right side  - describes epsiode of right arm weakness, occurred at grocery while carrying basket in right arm. Lasted for just a few minutes. No LE weakness, no facial droop or slurred speech. Lasted approx < 1 min.   Past Medical History  Diagnosis Date  . HTN (hypertension)      No Known Allergies   Current Outpatient Prescriptions  Medication Sig Dispense Refill  . aspirin 325 MG tablet Take 325 mg by mouth every other day.      [name] atenolol (TENORMIN) 50 MG tablet TAKE 1/2 TABLET BY MOUTH TWICE A DAY  15 tablet  5  . atorvastatin (LIPITOR) 10 MG tablet Take 1 tablet (10 mg total) by mouth daily.  30 tablet  4  . traMADol (ULTRAM) 50 MG tablet Take 1 tablet (50 mg total) by mouth every 6 (six) hours as needed for severe pain (Caution: drowsiness).  20 tablet  0   No current facility-administered medications for this visit.     Past Surgical History  Procedure Laterality Date  . Knee arthroscopy  right knee  . Hernia repair       No Known Allergies    Family History  Problem Relation Age of Onset  . Arthritis    . Cancer    . Diabetes       Social History Christopher [name] reports that he has never smoked. He does not have any smokeless tobacco history on file. Christopher [name] reports that he drinks alcohol.   Review of Systems CONSTITUTIONAL: No weight loss, fever, chills, weakness or fatigue.  HEENT: Eyes: No visual loss, blurred vision, double vision or yellow sclerae.No  hearing loss, sneezing, congestion, runny nose or sore throat.  SKIN: No rash or itching.  CARDIOVASCULAR: per HPI RESPIRATORY: No shortness of breath, cough or sputum.  GASTROINTESTINAL: No anorexia, nausea, vomiting or diarrhea. No abdominal pain or blood.  GENITOURINARY: No burning on urination, no polyuria NEUROLOGICAL: per HPI MUSCULOSKELETAL: No muscle, back pain, joint pain or stiffness.  LYMPHATICS: No enlarged nodes. No history of splenectomy.  PSYCHIATRIC: No history of depression or anxiety.  ENDOCRINOLOGIC: No reports of sweating, cold or heat intolerance. No polyuria or polydipsia.  [name]   Physical Examination p 50 bp 144/80 Wt 179 lbs BMI 25 Gen: resting comfortably, no acute distress HEENT: no scleral icterus, pupils equal round and reactive, no palptable cervical adenopathy,  CV: bdray rate 50, no m/r/g, no JVD Resp: Clear to auscultation bilaterally GI: abdomen is soft, non-tender, non-distended, normal bowel sounds, no hepatosplenomegaly MSK: extremities are warm, no edema.  Skin: warm, no rash Neuro:  no focal deficits Psych: appropriate affect   Diagnostic Studies  05/23/13 GXT Attending Note  Patient exercised according to the Bruce protocol for 12 minutes achieving a workload of 13.4 METs. Heart increased from 47 bpm up to 150 bpm (88% of THR). Blood pressure increased from 146/80 up to 220/85. The test was stopped due  to fatigue, the patient did not experience any chest pain.  Baseline EKG showed NSR. There were no ischemic EKG changes and no significant arrhythmias during stress.  Conclusions  1. Negative exercise stress test for ischemia  2. Duke treadmill score of 12, consistent with low risk for major cardiac events  3. Excellent exercise functional capacity (140% of predicted based on age and gender)  05/2013 Carotid US IMPRESSION: 1. Extensive bilateral intimal thickening but no focal plaque accumulation or stenosis.  05/2013 Echo Study  Conclusions  - Left ventricle: The cavity size was normal. Wall thickness was increased in a pattern of mild LVH. Systolic function was normal. The estimated ejection fraction was in the range of 55% to 60%. Wall motion was normal; there were no regional wall motion abnormalities. Left ventricular diastolic function parameters were normal. - Mitral valve: Trivial regurgitation. - Right ventricle: The cavity size was mildly dilated. Systolic function was normal. - Right atrium: The atrium was mildly dilated. Central venous pressure: 7mm Hg (est). - Atrial septum: No defect or patent foramen ovale was identified. - Tricuspid valve: Trivial regurgitation. - Pulmonic valve: Mild regurgitation. - Pulmonary arteries: PA peak pressure: 66mm Hg (S). - Pericardium, extracardiac: There was no pericardial effusion. Impressions:  - Normal LV chanber size with mild LVH and LVEF 55-60%. Normal diastolic function. Mildly dilated RV with normal contraction, mild right atrial enlargement. No obvious PFO or ASD. Normal PASP 29 mmHg. Transthoracic echocardiography. M-mode, complete     Assessment and Plan  1. Chest pain  - no recurrent symptoms - GXT negative for ischemia, with low risk Duke score and excellent functional capacity - continue risk factor modificaiton  2. Right sided numbness  - refer to neuro  - start ASA 81mg  daily   3. Carotid bruit  - no significant disease on recent US  F/u 1 year     Arnoldo Lenis, M.D., F.A.C.C.

## 2013-06-19 NOTE — Patient Instructions (Signed)
Your physician wants you to follow-up in: 1 year You will receive a reminder letter in the mail two months in advance. If you don't receive a letter, please call our office to schedule the follow-up appointment.   Your physician has recommended you make the following change in your medication:    DECREASE aspirin to 81 mg daily    You have been referred to neurology Dr.[place]    Thank you for choosing [facility] !

## 2013-06-26 ENCOUNTER — Telehealth: Payer: Self-pay | Admitting: Family Medicine

## 2013-06-26 NOTE — Telephone Encounter (Signed)
Please forward to his paper chart to me thanks

## 2013-06-26 NOTE — Telephone Encounter (Signed)
Patient says that his new cholestrol medication is too strong for him. It is making him dizzy. Please advise.   [facility]

## 2013-06-26 NOTE — Telephone Encounter (Signed)
Chart forwarded.  

## 2013-11-26 ENCOUNTER — Encounter: Payer: Self-pay | Admitting: Family Medicine

## 2013-11-26 ENCOUNTER — Ambulatory Visit (INDEPENDENT_AMBULATORY_CARE_PROVIDER_SITE_OTHER): Payer: 59 | Admitting: Family Medicine

## 2013-11-26 VITALS — BP 140/88 | Ht 71.5 in | Wt 188.4 lb

## 2013-11-26 DIAGNOSIS — R29898 Other symptoms and signs involving the musculoskeletal system: Secondary | ICD-10-CM

## 2013-11-26 DIAGNOSIS — M5010 Cervical disc disorder with radiculopathy, unspecified cervical region: Secondary | ICD-10-CM

## 2013-11-26 MED ORDER — PREDNISONE 20 MG PO TABS: ORAL_TABLET | ORAL | Status: AC

## 2013-11-26 MED ORDER — OXYCODONE-ACETAMINOPHEN 5-325 MG PO TABS
1.0000 | ORAL_TABLET | ORAL | Status: DC | PRN
Start: 2013-11-26 — End: 2013-12-10

## 2013-11-26 NOTE — Progress Notes (Signed)
   Subjective:    Patient ID: Christopher [name], male    DOB: May 15, 1960, 53 y.o.   MRN: [id number]  Shoulder Pain  The pain is present in the right shoulder, right arm, right lower leg, neck, right hand and right upper leg. This is a new problem. The current episode started in the past 7 days.   This started up over the weekend. He does not know what triggered it. Denies any falls denies being hit he relates pain in the neck radiates down the arm and noticed today that he was having some numbness in his hand along with weakness in his grip inability does twist an open things or grab things. This has never happened to him before.   Review of Systems No chest pain cough fever chills vomiting. Relates numbness in the hand weakness in the hand pain in the neck and down the arm.    Objective:   Physical Exam  On exam tenderness in the right trapezius area there is also weakness noted in his hand grip is 3+ over 5 on the right where as on the left it is 5 over 5. [name] a right-handed person so that is his dominant side in its obvious he has muscle weakness. As for the ability to spread the fingers on both hands that is normal. Biceps is normal deltoid normal. Lungs are clear heart regular negative straight leg raise      Assessment & Plan:  Significant neck pain with radiation down the right arm and right arm weakness this is a serious situation I recommend prednisone taper pain medication rest and MRI. Await the results of all of this. Patient was warned that if he starts having progressive symptoms immediately notify us

## 2013-11-29 ENCOUNTER — Ambulatory Visit (HOSPITAL_COMMUNITY): Payer: 59

## 2013-12-07 ENCOUNTER — Other Ambulatory Visit: Payer: Self-pay | Admitting: *Deleted

## 2013-12-07 ENCOUNTER — Telehealth: Payer: Self-pay | Admitting: *Deleted

## 2013-12-07 DIAGNOSIS — M5020 Other cervical disc displacement, unspecified cervical region: Secondary | ICD-10-CM

## 2013-12-07 NOTE — Telephone Encounter (Signed)
Pt had mri cervical spine at [facility]. Pt notified of results. Pt has a couple of small herniated disc causing his arm pain and weakness. Pt needs urgent referral to neurosurgical in [place] with in two weeks. Per Dr. Nicki Reaper. Order for referral put in. Copy of report sent to [facility]. Pt verbalized understanding.

## 2013-12-10 ENCOUNTER — Telehealth: Payer: Self-pay | Admitting: Family Medicine

## 2013-12-10 MED ORDER — OXYCODONE-ACETAMINOPHEN 5-325 MG PO TABS: 1.0000 | ORAL_TABLET | ORAL | Status: DC | PRN

## 2013-12-10 NOTE — Telephone Encounter (Signed)
oxyCODONE-acetaminophen (PERCOCET/ROXICET) 5-325 MG per tablet  Pt needs a refill on this med   Last seen 10/25 last filled same date

## 2013-12-10 NOTE — Telephone Encounter (Signed)
May refill med, awaiting neurosurgery appt ( referral just put in if pt has not heard within 1 week make sure pt calls [place])

## 2013-12-10 NOTE — Telephone Encounter (Signed)
Patient notified and verbalized understanding. 

## 2013-12-28 ENCOUNTER — Other Ambulatory Visit: Payer: Self-pay | Admitting: *Deleted

## 2013-12-28 ENCOUNTER — Telehealth: Payer: Self-pay | Admitting: Family Medicine

## 2013-12-28 MED ORDER — OXYCODONE-ACETAMINOPHEN 5-325 MG PO TABS: 1.0000 | ORAL_TABLET | Freq: Four times a day (QID) | ORAL | Status: DC | PRN

## 2013-12-28 NOTE — Telephone Encounter (Signed)
May refill oxycodone #100, if pain not better within next few weeks then may need ov for pain management Finally I though pt had appt with specialist? If so when

## 2013-12-28 NOTE — Telephone Encounter (Signed)
Requesting Rx for oxyCODONE-acetaminophen (PERCOCET/ROXICET) 5-325 MG per tablet

## 2013-12-28 NOTE — Telephone Encounter (Signed)
Seen 10/26 for shoulder pain. Prescribed prednisone and oxycodone 5/325 #40. Called pt he states he is taking one tablet four times daily.

## 2013-12-28 NOTE — Telephone Encounter (Signed)
Discussed with pt. appt is next tues with the specialist. rx upfront for pickup.

## 2014-01-03 ENCOUNTER — Other Ambulatory Visit: Payer: Self-pay | Admitting: Family Medicine

## 2014-01-12 ENCOUNTER — Encounter (HOSPITAL_COMMUNITY): Payer: Self-pay | Admitting: Emergency Medicine

## 2014-01-12 ENCOUNTER — Emergency Department (HOSPITAL_COMMUNITY)
Admission: EM | Admit: 2014-01-12 | Discharge: 2014-01-12 | Disposition: A | Discharge status: Home / Self Care | Payer: Worker's Compensation | Attending: Emergency Medicine | Admitting: Emergency Medicine

## 2014-01-12 DIAGNOSIS — W010XXA Fall on same level from slipping, tripping and stumbling without subsequent striking against object, initial encounter: Secondary | ICD-10-CM | POA: Diagnosis not present

## 2014-01-12 DIAGNOSIS — Y9289 Other specified places as the place of occurrence of the external cause: Secondary | ICD-10-CM | POA: Diagnosis not present

## 2014-01-12 DIAGNOSIS — Y9389 Activity, other specified: Secondary | ICD-10-CM | POA: Diagnosis not present

## 2014-01-12 DIAGNOSIS — Y998 Other external cause status: Secondary | ICD-10-CM | POA: Insufficient documentation

## 2014-01-12 DIAGNOSIS — I10 Essential (primary) hypertension: Secondary | ICD-10-CM | POA: Insufficient documentation

## 2014-01-12 DIAGNOSIS — E785 Hyperlipidemia, unspecified: Secondary | ICD-10-CM | POA: Diagnosis not present

## 2014-01-12 DIAGNOSIS — Z79899 Other long term (current) drug therapy: Secondary | ICD-10-CM | POA: Diagnosis not present

## 2014-01-12 DIAGNOSIS — S39012A Strain of muscle, fascia and tendon of lower back, initial encounter: Secondary | ICD-10-CM

## 2014-01-12 DIAGNOSIS — Z7982 Long term (current) use of aspirin: Secondary | ICD-10-CM | POA: Diagnosis not present

## 2014-01-12 DIAGNOSIS — M545 Low back pain: Secondary | ICD-10-CM | POA: Diagnosis present

## 2014-01-12 HISTORY — DX: Hyperlipidemia, unspecified: E78.5

## 2014-01-12 MED ORDER — HYDROCODONE-ACETAMINOPHEN 5-325 MG PO TABS: 1.0000 | ORAL_TABLET | ORAL | Status: DC | PRN

## 2014-01-12 MED ORDER — PREDNISONE (PAK) 10 MG PO TABS
ORAL_TABLET | Freq: Every day | ORAL | Status: DC
Start: 2014-01-12 — End: 2014-02-11

## 2014-01-12 MED ORDER — PREDNISONE 50 MG PO TABS
60.0000 mg | ORAL_TABLET | Freq: Once | ORAL | Status: AC
  Administered 2014-01-12: 60 mg via ORAL
  Filled 2014-01-12 (×2): qty 1

## 2014-01-12 MED ORDER — OXYCODONE-ACETAMINOPHEN 5-325 MG PO TABS
1.0000 | ORAL_TABLET | Freq: Once | ORAL | Status: AC
  Administered 2014-01-12: 1 via ORAL
  Filled 2014-01-12: qty 1

## 2014-01-12 NOTE — ED Provider Notes (Signed)
CSN: [id number]     Arrival date & time 01/12/14  0932 History   First MD Initiated Contact with Patient 01/12/14 1019     Chief Complaint  Patient presents with  . Back Pain     (Consider location/radiation/quality/duration/timing/severity/associated sxs/prior Treatment) Patient is a 53 y.o. male presenting with back pain. The history is provided by the patient.  Back Pain Location:  Lumbar spine Quality:  Aching and shooting Radiates to:  Does not radiate Pain severity:  Severe Pain is:  Same all the time Onset quality:  Sudden Duration:  1 day Timing:  Constant Progression:  Unchanged Chronicity:  New Context: falling   Relieved by:  Nothing Worsened by:  Ambulation and standing Ineffective treatments:  Muscle relaxants Associated symptoms: no bladder incontinence, no bowel incontinence and no dysuria    DYLON CORREA is a 53 y.o. male who presents to the ED with low back pain that started yesterday after he slipped and fell. He went to Urgent Care and was treated with Flexeril and had x-rays done. He took a Flexeril this morning but continues to have pain.   Past Medical History  Diagnosis Date  . HTN (hypertension)   . Hyperlipemia    Past Surgical History  Procedure Laterality Date  . Knee arthroscopy  right knee  . Hernia repair     Family History  Problem Relation Age of Onset  . Arthritis    . Cancer    . Diabetes     History  Substance Use Topics  . Smoking status: Never Smoker   . Smokeless tobacco: Not on file  . Alcohol Use: Yes     Comment: occasional beer    Review of Systems  Gastrointestinal: Negative for bowel incontinence.  Genitourinary: Negative for bladder incontinence and dysuria.  Musculoskeletal: Positive for back pain.  all other systems negative    Allergies  Review of patient's allergies indicates no known allergies.  Home Medications   Prior to Admission medications   Medication Sig Start Date End Date Taking?  Authorizing Provider  aspirin EC 81 MG tablet Take 81 mg by mouth daily.   Yes Historical Provider, MD  atenolol (TENORMIN) 50 MG tablet TAKE 1/2 TABLET BY MOUTH TWICE A DAY   Yes Scott A [name], MD  atorvastatin (LIPITOR) 10 MG tablet TAKE 1 TABLET BY MOUTH EVERY DAY 01/03/14  Yes Kathyrn Drown, MD  cyclobenzaprine (FLEXERIL) 10 MG tablet Take 1 tablet by mouth 3 (three) times daily. 01/11/14  Yes Historical Provider, MD  HYDROcodone-acetaminophen (NORCO/VICODIN) 5-325 MG per tablet Take 1 tablet by mouth every 4 (four) hours as needed. 01/12/14   Hope Bunnie Pion, NP  predniSONE (STERAPRED UNI-PAK) 10 MG tablet Take by mouth daily. Starting tomorrow 01/13/2014 take 5 tablets PO then 4, 3, 2, 1 01/12/14   Hope M Neese, NP   BP 168/84 mmHg  Pulse 74  Temp(Src) 97.8 F (36.6 C) (Oral)  Resp 18  Ht 5\' 5"  (1.651 m)  Wt 190 lb (86.183 kg)  BMI 31.62 kg/m2  SpO2 99% Physical Exam  Constitutional: [name] oriented to person, place, and time. He appears well-developed and well-nourished. No distress.  HENT:  Head: Normocephalic and atraumatic.  Eyes: EOM are normal. Pupils are equal, round, and reactive to light.  Neck: Normal range of motion. Neck supple.  Cardiovascular: Normal rate and regular rhythm.   Pulmonary/Chest: Effort normal. No respiratory distress. He has no wheezes. He has no rales.  Abdominal: Soft. Bowel  sounds are normal. There is no tenderness.  Musculoskeletal: Normal range of motion. He exhibits no edema.       Lumbar back: He exhibits tenderness. He exhibits normal range of motion, no deformity, no spasm and normal pulse.  Neurological: [name] alert and oriented to person, place, and time. He has normal strength. No cranial nerve deficit or sensory deficit. Coordination normal.  Reflex Scores:      Bicep reflexes are 2+ on the right side and 2+ on the left side.      Brachioradialis reflexes are 2+ on the right side and 2+ on the left side.      Patellar reflexes are 2+ on the  right side and 2+ on the left side.      Achilles reflexes are 2+ on the right side and 2+ on the left side. Pain with ambulation but no foot drag.   Skin: Skin is warm and dry.  Psychiatric: He has a normal mood and affect. His behavior is normal.  Nursing note and vitals reviewed.   ED Course  Procedures  MDM  53 y.o. male with low back pain s/p fall yesterday. Continues to have pain after evaluated, x-rayed and treated with muscle relaxants yesterday at Urgent Care. Stable for discharge without neuro deficits. Will treat for pain and inflammation. Patient to follow up with ortho if symptoms persist.  Discussed with the patient clinical findings and plan of careand all questioned fully answered. He will return if any problems arise.    Medication List    STOP taking these medications        oxyCODONE-acetaminophen 5-325 MG per tablet  Commonly known as:  PERCOCET/ROXICET      TAKE these medications        HYDROcodone-acetaminophen 5-325 MG per tablet  Commonly known as:  NORCO/VICODIN  Take 1 tablet by mouth every 4 (four) hours as needed.     predniSONE 10 MG tablet  Commonly known as:  STERAPRED UNI-PAK  Take by mouth daily. Starting tomorrow 01/13/2014 take 5 tablets PO then 4, 3, 2, 1      ASK your doctor about these medications        aspirin EC 81 MG tablet  Take 81 mg by mouth daily.     atenolol 50 MG tablet  Commonly known as:  TENORMIN  TAKE 1/2 TABLET BY MOUTH TWICE A DAY     atorvastatin 10 MG tablet  Commonly known as:  LIPITOR  TAKE 1 TABLET BY MOUTH EVERY DAY     cyclobenzaprine 10 MG tablet  Commonly known as:  FLEXERIL  Take 1 tablet by mouth 3 (three) times daily.        Final diagnoses:  Lumbosacral strain, initial encounter        [facility], NP 01/12/14 Woodson Alvino Chapel, MD 01/12/14 [phone]

## 2014-01-12 NOTE — Discharge Instructions (Signed)
Continue to take the muscle relaxant with the medications we give you today. Follow up with Dr. Aline Brochure if your symptoms persist.

## 2014-01-12 NOTE — ED Notes (Signed)
PT stated he slipped and fell yesterday and c/o lower back pain. PT states he was seen at urgent care with xrays yesterday and took one cyclobenzaprine 10mg  this am that was prescribed to him yesterday with no relief. PT denies any urinary symtoms.

## 2014-01-29 ENCOUNTER — Other Ambulatory Visit: Payer: Self-pay | Admitting: Family Medicine

## 2014-02-11 ENCOUNTER — Encounter (HOSPITAL_COMMUNITY): Payer: Self-pay | Admitting: *Deleted

## 2014-02-11 ENCOUNTER — Emergency Department (HOSPITAL_COMMUNITY): Payer: 59

## 2014-02-11 ENCOUNTER — Emergency Department (HOSPITAL_COMMUNITY)
Admission: EM | Admit: 2014-02-11 | Discharge: 2014-02-11 | Disposition: A | Discharge status: Home / Self Care | Payer: 59 | Attending: Emergency Medicine | Admitting: Emergency Medicine

## 2014-02-11 DIAGNOSIS — Z7982 Long term (current) use of aspirin: Secondary | ICD-10-CM | POA: Diagnosis not present

## 2014-02-11 DIAGNOSIS — W19XXXA Unspecified fall, initial encounter: Secondary | ICD-10-CM

## 2014-02-11 DIAGNOSIS — E785 Hyperlipidemia, unspecified: Secondary | ICD-10-CM | POA: Diagnosis not present

## 2014-02-11 DIAGNOSIS — I10 Essential (primary) hypertension: Secondary | ICD-10-CM | POA: Diagnosis not present

## 2014-02-11 DIAGNOSIS — Z79899 Other long term (current) drug therapy: Secondary | ICD-10-CM | POA: Insufficient documentation

## 2014-02-11 DIAGNOSIS — Y9301 Activity, walking, marching and hiking: Secondary | ICD-10-CM | POA: Insufficient documentation

## 2014-02-11 DIAGNOSIS — Y9289 Other specified places as the place of occurrence of the external cause: Secondary | ICD-10-CM | POA: Diagnosis not present

## 2014-02-11 DIAGNOSIS — Y998 Other external cause status: Secondary | ICD-10-CM | POA: Insufficient documentation

## 2014-02-11 DIAGNOSIS — W1839XA Other fall on same level, initial encounter: Secondary | ICD-10-CM | POA: Insufficient documentation

## 2014-02-11 DIAGNOSIS — S6992XA Unspecified injury of left wrist, hand and finger(s), initial encounter: Secondary | ICD-10-CM | POA: Diagnosis present

## 2014-02-11 DIAGNOSIS — S62112A Displaced fracture of triquetrum [cuneiform] bone, left wrist, initial encounter for closed fracture: Secondary | ICD-10-CM | POA: Insufficient documentation

## 2014-02-11 MED ORDER — HYDROCODONE-ACETAMINOPHEN 5-325 MG PO TABS: 1.0000 | ORAL_TABLET | Freq: Four times a day (QID) | ORAL | Status: DC | PRN

## 2014-02-11 MED ORDER — HYDROCODONE-ACETAMINOPHEN 5-325 MG PO TABS: 1.0000 | ORAL_TABLET | ORAL | Status: DC | PRN

## 2014-02-11 MED ORDER — IBUPROFEN 800 MG PO TABS: 800.0000 mg | ORAL_TABLET | Freq: Three times a day (TID) | ORAL | Status: DC | PRN

## 2014-02-11 NOTE — ED Notes (Signed)
Patient verbalizes understanding of discharge instructions, prescription medications, home care, and follow up care. Patient ambulatory out of department at this time. 

## 2014-02-11 NOTE — Discharge Instructions (Signed)
Cast or Splint Care °Casts and splints support injured limbs and keep bones from moving while they heal. It is important to care for your cast or splint at home.   °HOME CARE INSTRUCTIONS °· Keep the cast or splint uncovered during the drying period. It can take 24 to 48 hours to dry if it is made of plaster. A fiberglass cast will dry in less than 1 hour. °· Do not rest the cast on anything harder than a pillow for the first 24 hours. °· Do not put weight on your injured limb or apply pressure to the cast until your health care provider gives you permission. °· Keep the cast or splint dry. Wet casts or splints can lose their shape and may not support the limb as well. A wet cast that has lost its shape can also create harmful pressure on your skin when it dries. Also, wet skin can become infected. °¨ Cover the cast or splint with a plastic bag when bathing or when out in the rain or snow. If the cast is on the trunk of the body, take sponge baths until the cast is removed. °¨ If your cast does become wet, dry it with a towel or a blow dryer on the cool setting only. °· Keep your cast or splint clean. Soiled casts may be wiped with a moistened cloth. °· Do not place any hard or soft foreign objects under your cast or splint, such as cotton, toilet paper, lotion, or powder. °· Do not try to scratch the skin under the cast with any object. The object could get stuck inside the cast. Also, scratching could lead to an infection. If itching is a problem, use a blow dryer on a cool setting to relieve discomfort. °· Do not trim or cut your cast or remove padding from inside of it. °· Exercise all joints next to the injury that are not immobilized by the cast or splint. For example, if you have a long leg cast, exercise the hip joint and toes. If you have an arm cast or splint, exercise the shoulder, elbow, thumb, and fingers. °· Elevate your injured arm or leg on 1 or 2 pillows for the first 1 to 3 days to decrease  swelling and pain. It is best if you can comfortably elevate your cast so it is higher than your heart. °SEEK MEDICAL CARE IF:  °· Your cast or splint cracks. °· Your cast or splint is too tight or too loose. °· You have unbearable itching inside the cast. °· Your cast becomes wet or develops a soft spot or area. °· You have a bad smell coming from inside your cast. °· You get an object stuck under your cast. °· Your skin around the cast becomes red or raw. °· You have new pain or worsening pain after the cast has been applied. °SEEK IMMEDIATE MEDICAL CARE IF:  °· You have fluid leaking through the cast. °· You are unable to move your fingers or toes. °· You have discolored (blue or white), cool, painful, or very swollen fingers or toes beyond the cast. °· You have tingling or numbness around the injured area. °· You have severe pain or pressure under the cast. °· You have any difficulty with your breathing or have shortness of breath. °· You have chest pain. °Document Released: 01/16/2000 Document Revised: 11/08/2012 Document Reviewed: 07/27/2012 °[employer]® Patient Information ©2015 [employer]. This information is not intended to replace advice given to you by your health care   provider. Make sure you discuss any questions you have with your health care provider. ° °Wrist Fracture °A wrist fracture is a break or crack in one of the bones of your wrist. Your wrist is made up of eight small bones at the palm of your hand (carpal bones) and two long bones that make up your forearm (radius and ulna).  °CAUSES  °· A direct blow to the wrist. °· Falling on an outstretched hand. °· Trauma, such as a car accident or a fall. °RISK FACTORS °Risk factors for wrist fracture include:  °· Participating in contact and high-risk sports, such as skiing, biking, and ice skating. °· Taking steroid medicines. °· Smoking. °· Being male. °· Being Caucasian. °· Drinking more than three alcoholic beverages per day. °· Having low or  lowered bone density (osteoporosis or osteopenia). °· Age. Older adults have decreased bone density. °· Women who have had menopause. °· History of previous fractures. °SIGNS AND SYMPTOMS °Symptoms of wrist fractures include tenderness, bruising, and inflammation. Additionally, the wrist may hang in an odd position or appear deformed.  °DIAGNOSIS °Diagnosis may include: °· Physical exam. °· X-ray. °TREATMENT °Treatment depends on many factors, including the nature and location of the fracture, your age, and your activity level. Treatment for wrist fracture can be nonsurgical or surgical.  °Nonsurgical Treatment °A plaster cast or splint may be applied to your wrist if the bone is in a good position. If the fracture is not in good position, it may be necessary for your health care provider to realign it before applying a splint or cast. Usually, a cast or splint will be worn for several weeks.  °Surgical Treatment °Sometimes the position of the bone is so far out of place that surgery is required to apply a device to hold it together as it heals. Depending on the fracture, there are a number of options for holding the bone in place while it heals, such as a cast and metal pins.   °HOME CARE INSTRUCTIONS °· Keep your injured wrist elevated and move your fingers as much as possible. °· Do not put pressure on any part of your cast or splint. It may break.   °· Use a plastic bag to protect your cast or splint from water while bathing or showering. Do not lower your cast or splint into water. °· Take medicines only as directed by your health care provider. °· Keep your cast or splint clean and dry. If it becomes wet, damaged, or suddenly feels too tight, contact your health care provider right away. °· Do not use any tobacco products including cigarettes, chewing tobacco, or electronic cigarettes. Tobacco can delay bone healing. If you need help quitting, ask your health care provider. °· Keep all follow-up visits as  directed by your health care provider. This is important. °· Ask your health care provider if you should take supplements of calcium and vitamins C and D to promote bone healing. °SEEK MEDICAL CARE IF:  °· Your cast or splint is damaged, breaks, or gets wet. °· You have a fever. °· You have chills. °· You have continued severe pain or more swelling than you did before the cast was put on. °SEEK IMMEDIATE MEDICAL CARE IF:  °· Your hand or fingernails on the injured arm turn blue or gray, or feel cold or numb. °· You have decreased feeling in the fingers of your injured arm. °MAKE SURE YOU: °· Understand these instructions. °· Will watch your condition. °· Will get help   right away if you are not doing well or get worse. Document Released: 10/28/2004 Document Revised: 06/04/2013 Document Reviewed: 02/05/2011 [facility] Patient Information 2015 [place], [place]. This information is not intended to replace advice given to you by your health care provider. Make sure you discuss any questions you have with your health care provider. RICE: Routine Care for Injuries The routine care of many injuries includes Rest, Ice, Compression, and Elevation (RICE). HOME CARE INSTRUCTIONS  Rest is needed to allow your body to heal. Routine activities can usually be resumed when comfortable. Injured tendons and bones can take up to 6 weeks to heal. Tendons are the cord-like structures that attach muscle to bone.  Ice following an injury helps keep the swelling down and reduces pain.  Put ice in a plastic bag.  Place a towel between your skin and the bag.  Leave the ice on for 15-20 minutes, 3-4 times a day, or as directed by your health care provider. Do this while awake, for the first 24 to 48 hours. After that, continue as directed by your caregiver.  Compression helps keep swelling down. It also gives support and helps with discomfort. If an elastic bandage has been applied, it should be removed and reapplied every 3 to 4  hours. It should not be applied tightly, but firmly enough to keep swelling down. Watch fingers or toes for swelling, bluish discoloration, coldness, numbness, or excessive pain. If any of these problems occur, remove the bandage and reapply loosely. Contact your caregiver if these problems continue.  Elevation helps reduce swelling and decreases pain. With extremities, such as the arms, hands, legs, and feet, the injured area should be placed near or above the level of the heart, if possible. SEEK IMMEDIATE MEDICAL CARE IF:  You have persistent pain and swelling.  You develop redness, numbness, or unexpected weakness.  Your symptoms are getting worse rather than improving after several days. These symptoms may indicate that further evaluation or further X-rays are needed. Sometimes, X-rays may not show a small broken bone (fracture) until 1 week or 10 days later. Make a follow-up appointment with your caregiver. Ask when your X-ray results will be ready. Make sure you get your X-ray results. Document Released: 05/02/2000 Document Revised: 01/23/2013 Document Reviewed: 06/19/2010 [facility] Patient Information 2015 [place], [place]. This information is not intended to replace advice given to you by your health care provider. Make sure you discuss any questions you have with your health care provider.

## 2014-02-11 NOTE — ED Notes (Signed)
Fell today, pain lt wrist,

## 2014-02-11 NOTE — ED Provider Notes (Signed)
TIME SEEN: 9:30 PM  CHIEF COMPLAINT: Left wrist pain  HPI: Patient is a 54 y.o. M with history of hypertension, hyperlipidemia who presents to the emergency department with left wrist pain. States that he is right-hand dominant and reports that he was walking today when he felt like his back was going to give out on him and he fell forward catching himself with his left wrist. Has pain over the dorsal left wrist. Did not hit his head or lose consciousness. Reports that his back pain is something that is chronic for him. No new numbness, tingling or focal weakness. Emergency department the reports he forgot to take his blood pressure medication on time today. Denies headache, vision changes, chest pain or shortness of breath, numbness, tingling or focal weakness. No bowel or bladder incontinence.  ROS: See HPI Constitutional: no fever  Eyes: no drainage  ENT: no runny nose   Cardiovascular:  no chest pain  Resp: no SOB  GI: no vomiting GU: no dysuria Integumentary: no rash  Allergy: no hives  Musculoskeletal: no leg swelling  Neurological: no slurred speech ROS otherwise negative  PAST MEDICAL HISTORY/PAST SURGICAL HISTORY:  Past Medical History  Diagnosis Date  . HTN (hypertension)   . Hyperlipemia     MEDICATIONS:  Prior to Admission medications   Medication Sig Start Date End Date Taking? Authorizing Provider  aspirin EC 81 MG tablet Take 81 mg by mouth daily.    Historical Provider, MD  atenolol (TENORMIN) 50 MG tablet TAKE 1/2 TABLET BY MOUTH TWICE DAILY 01/29/14   Kathyrn Drown, MD  atorvastatin (LIPITOR) 10 MG tablet TAKE 1 TABLET BY MOUTH EVERY DAY 01/03/14   Kathyrn Drown, MD  HYDROcodone-acetaminophen (NORCO/VICODIN) 5-325 MG per tablet Take 1-2 tablets by mouth every 6 (six) hours as needed. 02/11/14   Sydnee Lamour N Addisynn Vassell, DO  ibuprofen (ADVIL,MOTRIN) 800 MG tablet Take 1 tablet (800 mg total) by mouth every 8 (eight) hours as needed for mild pain. 02/11/14   Shawndra Clute N Zani Kyllonen, DO     ALLERGIES:  No Known Allergies  SOCIAL HISTORY:  History  Substance Use Topics  . Smoking status: Never Smoker   . Smokeless tobacco: Not on file  . Alcohol Use: Yes     Comment: occasional beer    FAMILY HISTORY: Family History  Problem Relation Age of Onset  . Arthritis    . Cancer    . Diabetes      EXAM: BP 185/113 mmHg  Pulse 65  Temp(Src) 98.3 F (36.8 C) (Oral)  Resp 18  Ht 5\' 11"  (1.803 m)  Wt 190 lb (86.183 kg)  BMI 26.51 kg/m2  SpO2 99% CONSTITUTIONAL: Alert and oriented and responds appropriately to questions. Well-appearing; well-nourished; GCS 15 HEAD: Normocephalic; atraumatic EYES: Conjunctivae clear, PERRL, EOMI ENT: normal nose; no rhinorrhea; moist mucous membranes; pharynx without lesions noted; no dental injury;  no septal hematoma NECK: Supple, no meningismus, no LAD; no midline spinal tenderness, step-off or deformity CARD: RRR; S1 and S2 appreciated; no murmurs, no clicks, no rubs, no gallops RESP: Normal chest excursion without splinting or tachypnea; breath sounds clear and equal bilaterally; no wheezes, no rhonchi, no rales; chest wall stable, nontender to palpation ABD/GI: Normal bowel sounds; non-distended; soft, non-tender, no rebound, no guarding PELVIS:  stable, nontender to palpation BACK:  The back appears normal and is non-tender to palpation, there is no CVA tenderness; no midline spinal tenderness, step-off or deformity EXT: Tender to palpation over the dorsal left wrist,  no tenderness over the anatomic snuffbox and no pain with axial loading of the thumb, 2+ radial pulses bilaterally, decreased grip strength on the left side secondary to pain, normal sensation diffusely, compartments are soft, no joint effusion, otherwise Normal ROM in all joints; otherwise extremities are non-tender to palpation; no edema; normal capillary refill; no cyanosis    SKIN: Normal color for age and race; warm NEURO: Moves all extremities equally,  sensation to light touch intact diffusely, cranial nerves II through XII intact, normal gait PSYCH: The patient's mood and manner are appropriate. Grooming and personal hygiene are appropriate.  MEDICAL DECISION MAKING: Patient here with a small avulsion fracture of the triquetrum. Will place and volar splint and give outpatient follow-up information. We'll discharge with Vicodin and ibuprofen. Have advised him to start taking his blood pressure at home when he is feeling well. Suspect his hypertension is secondary to pain and not taking his blood pressure medicine on time today. States he take it just prior to arrival. States he has planned medication at home. He currently has asymptomatic hypertension. Discussed return precautions. He verbalizes understanding and is comfortable with plan.      [place], DO 02/11/14 2303

## 2014-02-14 ENCOUNTER — Ambulatory Visit (INDEPENDENT_AMBULATORY_CARE_PROVIDER_SITE_OTHER): Payer: 59 | Admitting: Orthopedic Surgery

## 2014-02-14 ENCOUNTER — Encounter: Payer: Self-pay | Admitting: Orthopedic Surgery

## 2014-02-14 DIAGNOSIS — S62112A Displaced fracture of triquetrum [cuneiform] bone, left wrist, initial encounter for closed fracture: Secondary | ICD-10-CM

## 2014-02-14 MED ORDER — HYDROCODONE-ACETAMINOPHEN 5-325 MG PO TABS: 1.0000 | ORAL_TABLET | Freq: Four times a day (QID) | ORAL | Status: DC | PRN

## 2014-02-14 NOTE — Progress Notes (Signed)
Patient ID: Christopher [name], male   DOB: 07-30-1960, 54 y.o.   MRN: [id number] Chief complaint pain left wrist  The patient's back started hurting a gave out he fell his hand went into dorsiflexion he thought it was okay but then it continued to hurt started swelling throbbing became constant went to the ER. X-rays show triquetral fracture  He presents for treatment of that  Review of systems numbness tingling related to his back other systems were reviewed comprehensive review negative  Past Medical History  Diagnosis Date  . HTN (hypertension)   . Hyperlipemia    Past Surgical History  Procedure Laterality Date  . Knee arthroscopy  right knee  . Hernia repair      Gen. appearance is normal well-groomed normal hygiene oriented 3 mood normal affect normal gait normal palpation of the wrist tenderness on the dorsum of the wrist compared to the opposite wrist there is swelling. He has decreased range of motion limited by pain stability tests were difficult to perform but his wrist shuck test was normal motor exam was normal skin normal pulses normal capillary refill normal temperature normal sensation normal lymph nodes normal  X-rays show triquetral fracture  Recommend brace 6 weeks follow-up 6 weeks return light duty for 6 weeks Norco every 6 5 mg #60

## 2014-02-14 NOTE — Patient Instructions (Signed)
Wear brace 6 weeks  Return to work light duty for 6 weeks

## 2014-02-19 MED FILL — Hydrocodone-Acetaminophen Tab 5-325 MG: ORAL | Qty: 6 | Status: AC

## 2014-03-04 ENCOUNTER — Other Ambulatory Visit: Payer: Self-pay | Admitting: Family Medicine

## 2014-03-28 ENCOUNTER — Ambulatory Visit (INDEPENDENT_AMBULATORY_CARE_PROVIDER_SITE_OTHER): Payer: Self-pay | Admitting: Orthopedic Surgery

## 2014-03-28 ENCOUNTER — Encounter: Payer: Self-pay | Admitting: Orthopedic Surgery

## 2014-03-28 VITALS — BP 137/87 | Ht 71.0 in | Wt 190.0 lb

## 2014-03-28 DIAGNOSIS — S62112D Displaced fracture of triquetrum [cuneiform] bone, left wrist, subsequent encounter for fracture with routine healing: Secondary | ICD-10-CM

## 2014-03-28 MED ORDER — ACETAMINOPHEN-CODEINE #3 300-30 MG PO TABS: 1.0000 | ORAL_TABLET | ORAL | Status: DC | PRN

## 2014-03-28 NOTE — Progress Notes (Signed)
Chief Complaint  Patient presents with  . Follow-up    6 week recheck Left wrist fracture, DOI 02/11/14     left wrist fracture triquetral bone fracture treated with brace  Patient symptoms have improved he is a little bit tender and has some pain with rotation but otherwise normal  Brace off     Light duty until March 7 follow-up when necessary

## 2014-03-28 NOTE — Patient Instructions (Signed)
Brace off  Continue light duty until March 7th

## 2014-05-07 ENCOUNTER — Other Ambulatory Visit: Payer: Self-pay | Admitting: Family Medicine

## 2014-05-11 ENCOUNTER — Encounter (HOSPITAL_COMMUNITY): Payer: Self-pay | Admitting: *Deleted

## 2014-05-11 ENCOUNTER — Emergency Department (HOSPITAL_COMMUNITY)
Admission: EM | Admit: 2014-05-11 | Discharge: 2014-05-11 | Disposition: A | Discharge status: Home / Self Care | Payer: 59 | Attending: Emergency Medicine | Admitting: Emergency Medicine

## 2014-05-11 DIAGNOSIS — X58XXXA Exposure to other specified factors, initial encounter: Secondary | ICD-10-CM | POA: Diagnosis not present

## 2014-05-11 DIAGNOSIS — Y9389 Activity, other specified: Secondary | ICD-10-CM | POA: Diagnosis not present

## 2014-05-11 DIAGNOSIS — Y998 Other external cause status: Secondary | ICD-10-CM | POA: Insufficient documentation

## 2014-05-11 DIAGNOSIS — Z7982 Long term (current) use of aspirin: Secondary | ICD-10-CM | POA: Diagnosis not present

## 2014-05-11 DIAGNOSIS — Z79899 Other long term (current) drug therapy: Secondary | ICD-10-CM | POA: Diagnosis not present

## 2014-05-11 DIAGNOSIS — S0501XA Injury of conjunctiva and corneal abrasion without foreign body, right eye, initial encounter: Secondary | ICD-10-CM | POA: Diagnosis not present

## 2014-05-11 DIAGNOSIS — S00251A Superficial foreign body of right eyelid and periocular area, initial encounter: Secondary | ICD-10-CM

## 2014-05-11 DIAGNOSIS — Y9289 Other specified places as the place of occurrence of the external cause: Secondary | ICD-10-CM | POA: Diagnosis not present

## 2014-05-11 DIAGNOSIS — I10 Essential (primary) hypertension: Secondary | ICD-10-CM | POA: Insufficient documentation

## 2014-05-11 MED ORDER — TETRACAINE HCL 0.5 % OP SOLN
1.0000 [drp] | Freq: Once | OPHTHALMIC | Status: AC
  Administered 2014-05-11: 1 [drp] via OPHTHALMIC
  Filled 2014-05-11: qty 2

## 2014-05-11 MED ORDER — TOBRAMYCIN 0.3 % OP SOLN
2.0000 [drp] | OPHTHALMIC | Status: DC
  Administered 2014-05-11: 2 [drp] via OPHTHALMIC
  Filled 2014-05-11: qty 5

## 2014-05-11 MED ORDER — FLUORESCEIN SODIUM 1 MG OP STRP
1.0000 | ORAL_STRIP | Freq: Once | OPHTHALMIC | Status: AC
  Administered 2014-05-11: 1 via OPHTHALMIC
  Filled 2014-05-11: qty 1

## 2014-05-11 NOTE — Discharge Instructions (Signed)
I removed the piece of trash from under the right upper eyelid. You have a corneal abrasion from the foreign body scraping each time you blinked. Use the eye drops 4 times a day for the next 5 to 7 days. If you symptoms are not improving call Dr. Iona Hansen to schedule an appointment or return here as needed.

## 2014-05-11 NOTE — ED Notes (Signed)
Pt states trash blew in his eye yesterday and now it is irritated.

## 2014-05-11 NOTE — ED Provider Notes (Addendum)
CSN: [id number]     Arrival date & time 05/11/14  2238 History   First MD Initiated Contact with Patient 05/11/14 2247     No chief complaint on file.    (Consider location/radiation/quality/duration/timing/severity/associated sxs/prior Treatment) Patient is a 54 y.o. male presenting with eye problem. The history is provided by the patient.  Eye Problem Location:  R eye Quality:  Foreign body sensation Severity:  Moderate Onset quality:  Sudden Duration:  1 day Timing:  Constant Progression:  Unchanged Chronicity:  New Context: foreign body   Foreign body:  Dirt Worsened by:  Bright light and eye movement Ineffective treatments:  None tried  OMARIO ANDER is a 54 y.o. male who presents to the ED with right eye irritation. He states that yesterday he was mowing and trash blew in his eye and it continues to feel irritated. He tried to wash it out but continues to feel like something there.   Past Medical History  Diagnosis Date  . HTN (hypertension)   . Hyperlipemia    Past Surgical History  Procedure Laterality Date  . Knee arthroscopy  right knee  . Hernia repair     Family History  Problem Relation Age of Onset  . Arthritis    . Cancer    . Diabetes     History  Substance Use Topics  . Smoking status: Never Smoker   . Smokeless tobacco: Not on file  . Alcohol Use: Yes     Comment: occasional beer    Review of Systems Negative except as stated in HPI   Allergies  Ibuprofen  Home Medications   Prior to Admission medications   Medication Sig Start Date End Date Taking? Authorizing Provider  aspirin EC 81 MG tablet Take 81 mg by mouth daily.   Yes Historical Provider, MD  atenolol (TENORMIN) 50 MG tablet TAKE 1/2 TABLET BY MOUTH TWICE DAILY 01/29/14  Yes Kathyrn Drown, MD  atorvastatin (LIPITOR) 10 MG tablet TAKE 1 TABLET BY MOUTH EVERY DAY 05/07/14  Yes Kathyrn Drown, MD  tetrahydrozoline 0.05 % ophthalmic solution Place 1 drop into the right eye once.  Used to help with eye irritation   Yes Historical Provider, MD  acetaminophen-codeine (TYLENOL #3) 300-30 MG per tablet Take 1 tablet by mouth every 4 (four) hours as needed for moderate pain. Patient not taking: Reported on 05/11/2014 03/28/14   Carole Civil, MD  HYDROcodone-acetaminophen (NORCO/VICODIN) 5-325 MG per tablet Take 1 tablet by mouth every 6 (six) hours as needed for moderate pain. Patient not taking: Reported on 03/28/2014 02/14/14   Carole Civil, MD  ibuprofen (ADVIL,MOTRIN) 800 MG tablet Take 1 tablet (800 mg total) by mouth every 8 (eight) hours as needed for mild pain. Patient not taking: Reported on 03/28/2014 02/11/14   Kristen N Ward, DO   BP 155/102 mmHg  Pulse 83  Temp(Src) 97.9 F (36.6 C) (Oral)  Resp 16  SpO2 96% Physical Exam  Constitutional: [name] oriented to person, place, and time. He appears well-developed and well-nourished. No distress.  HENT:  Head: Normocephalic.  Eyes: EOM are normal. Pupils are equal, round, and reactive to light. Foreign body present in the right eye. Right conjunctiva is injected.  Slit lamp exam:      The right eye shows corneal abrasion and fluorescein uptake. The right eye shows no corneal ulcer.  Right upper lid everted and black spec noted to the lateral aspect. Removed with sterile cotton applicator.   Neck:  Neck supple.  Cardiovascular: Normal rate.   Pulmonary/Chest: Effort normal.  Musculoskeletal: Normal range of motion.  Neurological: [name] alert and oriented to person, place, and time. No cranial nerve deficit.  Skin: Skin is warm and dry.  Psychiatric: He has a normal mood and affect. His behavior is normal.  Nursing note and vitals reviewed.   ED Course  Procedures Tobramycin Opth drops with first dose instilled her in the ED.  MDM  54 y.o. male with right eye redness, irritation and feeling of foreign body since trash flew in his eye yesterday. Stable for d/c with normal visual acuity, foreign body  removed from under the right upper lid. Will treat for corneal abrasion and he will follow up with opthalmology if symptoms persist.   Final diagnoses:  Acute foreign body of right eyelid, initial encounter  Corneal abrasion, right, initial encounter     [facility], NP 05/11/14 [place], MD 05/11/14 [address] NP 05/11/14 [place], MD 05/11/14 2347

## 2014-05-27 ENCOUNTER — Other Ambulatory Visit: Payer: Self-pay | Admitting: Family Medicine

## 2014-07-03 ENCOUNTER — Other Ambulatory Visit: Payer: Self-pay | Admitting: Family Medicine

## 2014-07-03 NOTE — Telephone Encounter (Signed)
Needs office Visit. 

## 2014-07-08 ENCOUNTER — Other Ambulatory Visit: Payer: Self-pay | Admitting: Family Medicine

## 2014-07-08 ENCOUNTER — Ambulatory Visit: Payer: 59 | Admitting: Family Medicine

## 2014-07-08 NOTE — Telephone Encounter (Signed)
Needs office Visit. 

## 2014-07-09 ENCOUNTER — Ambulatory Visit (INDEPENDENT_AMBULATORY_CARE_PROVIDER_SITE_OTHER): Payer: 59 | Admitting: Family Medicine

## 2014-07-09 ENCOUNTER — Encounter: Payer: Self-pay | Admitting: Family Medicine

## 2014-07-09 VITALS — BP 128/70 | Temp 98.2°F | Ht 71.5 in | Wt 183.0 lb

## 2014-07-09 DIAGNOSIS — M76892 Other specified enthesopathies of left lower limb, excluding foot: Secondary | ICD-10-CM

## 2014-07-09 DIAGNOSIS — M6580 Other synovitis and tenosynovitis, unspecified site: Secondary | ICD-10-CM

## 2014-07-09 MED ORDER — MELOXICAM 15 MG PO TABS: 15.0000 mg | ORAL_TABLET | Freq: Every day | ORAL | Status: DC

## 2014-07-09 MED ORDER — HYDROCODONE-ACETAMINOPHEN 5-325 MG PO TABS: 1.0000 | ORAL_TABLET | Freq: Four times a day (QID) | ORAL | Status: DC | PRN

## 2014-07-09 MED ORDER — PREDNISONE 20 MG PO TABS: ORAL_TABLET | ORAL | Status: DC

## 2014-07-09 NOTE — Patient Instructions (Signed)
Cold compresses 20 minutes every night

## 2014-07-09 NOTE — Progress Notes (Signed)
   Subjective:    Patient ID: Christopher [name], male    DOB: 08-20-1960, 54 y.o.   MRN: [id number]  Knee Pain  The incident occurred more than 1 week ago. The incident occurred at home. There was no injury mechanism. The pain is present in the left knee. The quality of the pain is described as aching. The pain is at a severity of 10/10. The pain is moderate. The pain has been intermittent since onset. He reports no foreign bodies present. The symptoms are aggravated by movement. He has tried ice and NSAIDs for the symptoms. The treatment provided no relief.   Patient has no other concerns at this time.    Review of Systems Knee at times feels like it once to give way does not lock on him no known injury does a lot of physical labor.    Objective:   Physical Exam Crepitus noted in both knees worse on the left than the right some swelling ligaments are stable moderate lateral tenderness       Assessment & Plan:  Left knee pain tendinitis cool compresses toward the end of the day hydrocodone for home use as needed for pain caution drowsiness not for long-term use meloxicam 15 mg 1 daily short prednisone taper over the next 5 days patient deferred on shot follow-up when necessary

## 2014-07-18 ENCOUNTER — Encounter: Payer: Self-pay | Admitting: Family Medicine

## 2014-09-04 ENCOUNTER — Other Ambulatory Visit: Payer: Self-pay | Admitting: Family Medicine

## 2014-09-25 ENCOUNTER — Other Ambulatory Visit: Payer: Self-pay | Admitting: Family Medicine

## 2014-09-25 NOTE — Telephone Encounter (Signed)
30 day supply, send patient a card letting him know office visit necessary

## 2014-10-29 ENCOUNTER — Telehealth: Payer: Self-pay | Admitting: Family Medicine

## 2014-10-29 MED ORDER — CHLORZOXAZONE 500 MG PO TABS: 500.0000 mg | ORAL_TABLET | Freq: Three times a day (TID) | ORAL | Status: DC | PRN

## 2014-10-29 NOTE — Telephone Encounter (Signed)
Pt is requesting something to be called in to [facility] for his lower back pain. Pt states that it went away and now it is back. Pt states that last time he had this problem he was given a muscle relaxer for it.  [facility]

## 2014-10-29 NOTE — Telephone Encounter (Signed)
The patient should try stretching exercises, he can also try ibuprofen for 5-7 days. May call in Parafon forte one every 8 hours when necessary muscle spasms cautioned drowsiness #21 no refills if frequent back pain or worsening condition I recommend office visit

## 2014-10-29 NOTE — Telephone Encounter (Signed)
Given flexeril 10mg  in 01/2014

## 2014-10-29 NOTE — Telephone Encounter (Signed)
Rx sent electronically to the pharmacy. Patient notified. 

## 2014-10-31 ENCOUNTER — Telehealth: Payer: Self-pay | Admitting: Family Medicine

## 2014-10-31 ENCOUNTER — Encounter: Payer: Self-pay | Admitting: Family Medicine

## 2014-10-31 NOTE — Telephone Encounter (Signed)
W/E done  °

## 2014-10-31 NOTE — Telephone Encounter (Signed)
Please give thanks

## 2014-10-31 NOTE — Telephone Encounter (Signed)
Patient needs work excuse for 10/29/14 through 10/31/14, RTW 11/01/14 Back pain flare up  Please call when ready

## 2014-11-05 ENCOUNTER — Other Ambulatory Visit: Payer: Self-pay | Admitting: Family Medicine

## 2014-11-05 NOTE — Telephone Encounter (Signed)
Last chronic visit  March 2015. May I refill?

## 2014-11-05 NOTE — Telephone Encounter (Signed)
Last refill no further refills/ medicines until follow-up office visit

## 2014-11-25 ENCOUNTER — Other Ambulatory Visit: Payer: Self-pay | Admitting: Family Medicine

## 2015-01-09 ENCOUNTER — Other Ambulatory Visit: Payer: Self-pay | Admitting: Family Medicine

## 2015-01-09 NOTE — Telephone Encounter (Signed)
May we refill. Last chronic visit 2015

## 2015-01-10 NOTE — Telephone Encounter (Signed)
[place] in 30 day, mail cart to patient last absolute refill until being seen

## 2015-01-27 ENCOUNTER — Other Ambulatory Visit: Payer: Self-pay | Admitting: Family Medicine

## 2015-03-15 ENCOUNTER — Other Ambulatory Visit: Payer: Self-pay | Admitting: Family Medicine

## 2015-03-27 ENCOUNTER — Other Ambulatory Visit: Payer: Self-pay | Admitting: Family Medicine

## 2015-05-01 ENCOUNTER — Other Ambulatory Visit: Payer: Self-pay | Admitting: *Deleted

## 2015-05-01 ENCOUNTER — Telehealth: Payer: Self-pay | Admitting: Family Medicine

## 2015-05-01 MED ORDER — ATENOLOL 50 MG PO TABS: 25.0000 mg | ORAL_TABLET | Freq: Two times a day (BID) | ORAL | Status: DC

## 2015-05-01 NOTE — Telephone Encounter (Signed)
2 week supply sent to pharm. Pt notified.

## 2015-05-01 NOTE — Telephone Encounter (Signed)
t  atenolol (TENORMIN) 50 MG tablet     Pt is needing a refill to last him to his appt,   [facility]

## 2015-05-09 ENCOUNTER — Encounter: Payer: Self-pay | Admitting: Family Medicine

## 2015-05-09 ENCOUNTER — Ambulatory Visit (INDEPENDENT_AMBULATORY_CARE_PROVIDER_SITE_OTHER): Payer: PRIVATE HEALTH INSURANCE | Admitting: Family Medicine

## 2015-05-09 VITALS — BP 120/88 | Ht 71.5 in | Wt 186.2 lb

## 2015-05-09 DIAGNOSIS — E785 Hyperlipidemia, unspecified: Secondary | ICD-10-CM

## 2015-05-09 DIAGNOSIS — I10 Essential (primary) hypertension: Secondary | ICD-10-CM | POA: Diagnosis not present

## 2015-05-09 DIAGNOSIS — M7541 Impingement syndrome of right shoulder: Secondary | ICD-10-CM

## 2015-05-09 MED ORDER — ATORVASTATIN CALCIUM 10 MG PO TABS: 10.0000 mg | ORAL_TABLET | Freq: Every day | ORAL | Status: DC

## 2015-05-09 MED ORDER — HYDROCODONE-ACETAMINOPHEN 5-325 MG PO TABS: 1.0000 | ORAL_TABLET | Freq: Four times a day (QID) | ORAL | Status: DC | PRN

## 2015-05-09 MED ORDER — ATENOLOL 50 MG PO TABS: 25.0000 mg | ORAL_TABLET | Freq: Two times a day (BID) | ORAL | Status: DC

## 2015-05-09 MED ORDER — IBUPROFEN 800 MG PO TABS: 800.0000 mg | ORAL_TABLET | Freq: Three times a day (TID) | ORAL | Status: DC | PRN

## 2015-05-09 NOTE — Patient Instructions (Signed)
Dear Patient,  It has been recommended to you that you have a colonoscopy. It is your responsibility to carry through with this recommendation.   Did you realize that colon cancer is the second leading cancer killer in the [place]. One in every 20 adults will get colon cancer. If all adults would go through the recommended screening for colon cancer (getting a colonoscopy), then there would be a 60% reduction in the number of people dying from colon cancer.  Colon cancer just doesn't come out of the blue. It starts off as a small polyp which over time grows into a cancer. A colonoscopy can prevent cancer and in many cases detected when it is at a very treatable phase. Small colon cancers can have cure rates of 95%. Advanced colon cancer, which often occurs in people who do not do their screenings, have cure rates less than 20%. The risk of colon cancer advances with age. Most adults should have regular colonoscopies every 10 years starting at age 62. This recommendation can vary depending on a person's medical history.  Health-care laws now allow for you to call the gastroenterologist office directly in order to set yourself up for this very important tests. Today we have recommended to you that you do this test. This test may save your life. Failure to do this test puts you at risk for premature death from colon cancer. Do the right thing and schedule this test now.  Here as a list of specialists we recommend in the surrounding area. When you call their office let them know that you are a patient of our practice in your interested in doing a screening colonoscopy. They should assist you without problems. You will need the following information when you called them: 1-name of which Dr. you see, 2-your insurance information, 3-a list of medications that you currently take, 4-any allergies you have to medications.  [place] gastroenterologist Dr. Milton Ferguson, Dr Felicie Morn  gastroenterologist   [facility]   [phone]  [facility] gastroenterology (Dr. Garnetta Buddy and [place]) [phone]  [facility] gastroenterology (Dr. Leia Alf, Harrietta Guardian, [place]) [phone]  Each group of specialists has assured [place] that when you called them they will help you get your colonoscopy set up. Should you have problems or if the GI practice insist a referral be done please let us know. Be sure to call soon. Sincerely, Pearson Forster, Dr Mickie Hillier, Dr.Jeimy Bickert Lia Foyer- you need to do your colonoscopy! Call me-Dr Nicki Reaper

## 2015-05-09 NOTE — Progress Notes (Signed)
   Subjective:    Patient ID: Christopher [name], male    DOB: [birth year], 55 y.o.   MRN: [id number]  Hypertension This is a chronic problem. The current episode started more than 1 year ago. The problem has been gradually improving since onset. There are no associated agents to hypertension. There are no known risk factors for coronary artery disease. Treatments tried: atenolol. The current treatment provides moderate improvement. There are no compliance problems.   Patient with hyperlipidemia takes his medicine tries eat somewhat healthy keeps his weight check Patient states that he has no concerns at this time.  Patient denies any chest tightness pressure pain shortness breath nausea from diarrhea is tries eat healthy. Relates a lot of shoulder pain bilateral. Hurts with certain movements. In addition this denies any abdominal pain relates compliance with his medicines. Takes ibuprofen intermittently.  Review of Systems See above.    Objective:   Physical Exam Neck no masses lungs clear no crackles heart regular abdomen soft no guarding rebound extremities no edema skin warm dry  25 minutes was spent with the patient. Greater than half the time was spent in discussion and answering questions and counseling regarding the issues that the patient came in for today.      Assessment & Plan:  Hypertension-decent control continue current measures Shoulder pains-eventually he will not be limited continued to do the type of work he is doing currently. Vicodin when necessary for severe pain home use only Hyperlipidemia check lipid liver profile continue medication PSA ordered

## 2015-06-12 ENCOUNTER — Other Ambulatory Visit: Payer: Self-pay | Admitting: Family Medicine

## 2015-06-12 LAB — BASIC METABOLIC PANEL
BUN: 14 mg/dL (ref 7–25)
CALCIUM: 8.6 mg/dL (ref 8.6–10.3)
CO2: 27 mmol/L (ref 20–31)
Chloride: 103 mmol/L (ref 98–110)
Creat: 1.06 mg/dL (ref 0.70–1.33)
GLUCOSE: 104 mg/dL — AB (ref 65–99)
POTASSIUM: 4 mmol/L (ref 3.5–5.3)
Sodium: 140 mmol/L (ref 135–146)

## 2015-06-12 LAB — LIPID PANEL
CHOL/HDL RATIO: 3.2 ratio (ref <=5.0)
CHOLESTEROL: 190 mg/dL (ref 125–200)
HDL: 59 mg/dL (ref >=40)
LDL Cholesterol: 114 mg/dL (ref <130)
TRIGLYCERIDES: 87 mg/dL (ref <150)
VLDL: 17 mg/dL (ref <30)

## 2015-06-12 LAB — HEPATIC FUNCTION PANEL
ALT: 13 U/L (ref 9–46)
AST: 14 U/L (ref 10–35)
Albumin: 4 g/dL (ref 3.6–5.1)
Alkaline Phosphatase: 78 U/L (ref 40–115)
BILIRUBIN DIRECT: 0.1 mg/dL (ref <=0.2)
Indirect Bilirubin: 0.5 mg/dL (ref 0.2–1.2)
TOTAL PROTEIN: 6.5 g/dL (ref 6.1–8.1)
Total Bilirubin: 0.6 mg/dL (ref 0.2–1.2)

## 2015-06-13 ENCOUNTER — Other Ambulatory Visit: Payer: Self-pay | Admitting: Family Medicine

## 2015-06-13 LAB — PSA: PSA: 1.41 ng/mL (ref <=4.00)

## 2015-06-16 ENCOUNTER — Encounter: Payer: Self-pay | Admitting: Family Medicine

## 2015-07-29 ENCOUNTER — Emergency Department (HOSPITAL_COMMUNITY)
Admission: EM | Admit: 2015-07-29 | Discharge: 2015-07-29 | Disposition: A | Discharge status: Home / Self Care | Payer: PRIVATE HEALTH INSURANCE | Attending: Emergency Medicine | Admitting: Emergency Medicine

## 2015-07-29 ENCOUNTER — Encounter (HOSPITAL_COMMUNITY): Payer: Self-pay | Admitting: Emergency Medicine

## 2015-07-29 DIAGNOSIS — I10 Essential (primary) hypertension: Secondary | ICD-10-CM | POA: Insufficient documentation

## 2015-07-29 DIAGNOSIS — S61213A Laceration without foreign body of left middle finger without damage to nail, initial encounter: Secondary | ICD-10-CM | POA: Diagnosis present

## 2015-07-29 DIAGNOSIS — IMO0002 Reserved for concepts with insufficient information to code with codable children: Secondary | ICD-10-CM

## 2015-07-29 DIAGNOSIS — Y939 Activity, unspecified: Secondary | ICD-10-CM | POA: Insufficient documentation

## 2015-07-29 DIAGNOSIS — W293XXA Contact with powered garden and outdoor hand tools and machinery, initial encounter: Secondary | ICD-10-CM | POA: Diagnosis not present

## 2015-07-29 DIAGNOSIS — E785 Hyperlipidemia, unspecified: Secondary | ICD-10-CM | POA: Diagnosis not present

## 2015-07-29 DIAGNOSIS — Y929 Unspecified place or not applicable: Secondary | ICD-10-CM | POA: Insufficient documentation

## 2015-07-29 DIAGNOSIS — Y999 Unspecified external cause status: Secondary | ICD-10-CM | POA: Insufficient documentation

## 2015-07-29 MED ORDER — LIDOCAINE HCL (PF) 1 % IJ SOLN
5.0000 mL | Freq: Once | INTRAMUSCULAR | Status: AC
  Administered 2015-07-29: 5 mL
  Filled 2015-07-29: qty 5

## 2015-07-29 MED ORDER — BACITRACIN ZINC 500 UNIT/GM EX OINT
1.0000 "application " | TOPICAL_OINTMENT | Freq: Two times a day (BID) | CUTANEOUS | Status: DC
  Administered 2015-07-29: 1 via TOPICAL
  Filled 2015-07-29: qty 0.9

## 2015-07-29 NOTE — ED Notes (Signed)
Pt states he cut his left middle finger with a hedge trimmer

## 2015-07-29 NOTE — Discharge Instructions (Signed)

## 2015-07-29 NOTE — ED Provider Notes (Signed)
CSN: [id number]     Arrival date & time 07/29/15  1534 History   First MD Initiated Contact with Patient 07/29/15 1542     Chief Complaint  Patient presents with  . Extremity Laceration     (Consider location/radiation/quality/duration/timing/severity/associated sxs/prior Treatment) HPI Comments: Patient presents to the emergency department with chief complaint of finger laceration. He states that he was using a hedge trimmer this morning, when he cut his left middle finger after the hedge tremor became jammed. He complains of injury to the tip of his middle finger. Bleeding is controlled with gauze. Last tetanus shot was 2 years ago. He denies any difficulty moving the finger. Denies any significant pain. He has not taken anything for her symptoms. There are no modifying factors.  The history is provided by the patient. No language interpreter was used.    Past Medical History  Diagnosis Date  . HTN (hypertension)   . Hyperlipemia    Past Surgical History  Procedure Laterality Date  . Knee arthroscopy  right knee  . Hernia repair     Family History  Problem Relation Age of Onset  . Arthritis    . Cancer    . Diabetes     Social History  Substance Use Topics  . Smoking status: Never Smoker   . Smokeless tobacco: None  . Alcohol Use: Yes     Comment: occasional beer    Review of Systems  Skin: Positive for wound.  All other systems reviewed and are negative.     Allergies  Review of patient's allergies indicates no known allergies.  Home Medications   Prior to Admission medications   Medication Sig Start Date End Date Taking? Authorizing Provider  aspirin EC 81 MG tablet Take 81 mg by mouth daily.    Historical Provider, MD  atenolol (TENORMIN) 50 MG tablet Take 0.5 tablets (25 mg total) by mouth 2 (two) times daily. 05/09/15   Kathyrn Drown, MD  atorvastatin (LIPITOR) 10 MG tablet Take 1 tablet (10 mg total) by mouth daily. 05/09/15   Kathyrn Drown, MD   chlorzoxazone (PARAFON) 500 MG tablet TAKE 1 TABLET(500 MG) BY MOUTH THREE TIMES DAILY AS NEEDED FOR MUSCLE SPASMS 06/13/15   Mikey Kirschner, MD  HYDROcodone-acetaminophen (NORCO/VICODIN) 5-325 MG tablet Take 1 tablet by mouth every 6 (six) hours as needed for moderate pain. 05/09/15   Kathyrn Drown, MD  ibuprofen (ADVIL,MOTRIN) 800 MG tablet Take 1 tablet (800 mg total) by mouth every 8 (eight) hours as needed for mild pain. 05/09/15   Kathyrn Drown, MD   BP 171/89 mmHg  Pulse 64  Temp(Src) 98.1 F (36.7 C) (Oral)  Resp 18  Ht 5\' 11"  (1.803 m)  Wt 85.73 kg  BMI 26.37 kg/m2  SpO2 100% Physical Exam  Constitutional: [name] oriented to person, place, and time. He appears well-developed and well-nourished.  HENT:  Head: Normocephalic and atraumatic.  Eyes: Conjunctivae and EOM are normal.  Neck: Normal range of motion.  Cardiovascular: Normal rate.   Pulmonary/Chest: Effort normal.  Abdominal: He exhibits no distension.  Musculoskeletal: Normal range of motion.  Range of motion and strength of left middle finger is 5/5 No evidence of tendon or ligament injury  Neurological: [name] alert and oriented to person, place, and time.  Skin: Skin is dry.  1 cm stellate laceration to the left distal middle finger, no involvement of the nail or nail bed, no foreign body, no obvious bone injury  Psychiatric: He has a normal mood and affect. His behavior is normal. Judgment and thought content normal.  Nursing note and vitals reviewed.   ED Course  Procedures (including critical care time) LACERATION REPAIR Performed by: Montine Circle Authorized by: Montine Circle Consent: Verbal consent obtained. Risks and benefits: risks, benefits and alternatives were discussed Consent given by: patient Patient identity confirmed: provided demographic data Prepped and Draped in normal sterile fashion Wound explored  Laceration Location: left middle finger  Laceration Length: 1 cm stellate  No  Foreign Bodies seen or palpated  Anesthesia: digital block  Local anesthetic: lidocaine 1% without epinephrine  Anesthetic total: 3 ml  Irrigation method: syringe Amount of cleaning: standard  Skin closure: 5-0 vicryl  Number of sutures: 5  Technique: interrupted  Patient tolerance: Patient tolerated the procedure well with no immediate complications.   MDM   Final diagnoses:  Laceration    Patient with finger laceration to left middle distal finger. Laceration repaired. Patient is up-to-date on his tetanus shot. Wound care precautions given. Sutures should dissolve in 5-7 days. Recommend primary care follow-up in one week.    Montine Circle, PA-C 07/29/15 [place], MD 07/31/15 [phone]

## 2015-07-30 ENCOUNTER — Ambulatory Visit (INDEPENDENT_AMBULATORY_CARE_PROVIDER_SITE_OTHER): Payer: PRIVATE HEALTH INSURANCE | Admitting: Family Medicine

## 2015-07-30 ENCOUNTER — Encounter: Payer: Self-pay | Admitting: Family Medicine

## 2015-07-30 ENCOUNTER — Telehealth: Payer: Self-pay | Admitting: Family Medicine

## 2015-07-30 VITALS — BP 140/90 | Ht 71.5 in | Wt 182.5 lb

## 2015-07-30 DIAGNOSIS — S61219D Laceration without foreign body of unspecified finger without damage to nail, subsequent encounter: Secondary | ICD-10-CM | POA: Diagnosis not present

## 2015-07-30 MED ORDER — HYDROCODONE-ACETAMINOPHEN 5-325 MG PO TABS: 1.0000 | ORAL_TABLET | Freq: Four times a day (QID) | ORAL | Status: DC | PRN

## 2015-07-30 MED ORDER — MUPIROCIN 2 % EX OINT: TOPICAL_OINTMENT | CUTANEOUS | Status: DC

## 2015-07-30 MED ORDER — DOXYCYCLINE HYCLATE 100 MG PO CAPS: 100.0000 mg | ORAL_CAPSULE | Freq: Two times a day (BID) | ORAL | Status: DC

## 2015-07-30 NOTE — Telephone Encounter (Signed)
Spoke with patient and patient stated that he has a laceration to middle finger.Tip of finger was cut off and sewn on at the ER. Patient stated numbness medication was worn off and was unable to work due to pain. Gave patient next available appointment today.

## 2015-07-30 NOTE — Telephone Encounter (Signed)
Patient seen in ER last night for laceration of his finger.  They advised him to follow up in a week with Dr. Nicki Reaper, so we scheduled him for 08/04/15.  He then states that they did not give him any pain medication and advised him to see if we could give him pain meds.  Does he need to be seen sooner?  Please advise.

## 2015-07-30 NOTE — Telephone Encounter (Signed)
Nurses please talk with the patient. If he feels he needs to be seen this week for recheck I'm more than happy to recheck him. Where was the laceration in regards to the finger? Any signs of infection? Pain levels? He could be seen later today without difficulty

## 2015-07-30 NOTE — Progress Notes (Signed)
   Subjective:    Patient ID: Christopher [name], male    DOB: October 19, 1960, 55 y.o.   MRN: [id number]  HPI Patient is here because he was seen in ER last night for laceration of his finger. Patient states that the area is very painful. Patient would like some pain medication prescribed for this.   ER record was reviewed Review of Systems    relates pain discomfort throbbing Objective:   Physical Exam  On physical exam the patient does have some stiffness in the distal joint but no sign of infection of the joint the distal part his finger does have a laceration some tenderness slight redness may be some early localized infection but nothing severe      Assessment & Plan:  I believe it would be a good idea for this patient to do warm soaks a couple times a day in soapy water cleaning appropriately placed Bactroban ointment on it and follow-up this coming Monday for suture removal  I highly recommend that the patient take doxycycline twice a day for the next 10 days follow-up sooner if any signs of infection pain medication only when he is not driving  After the patient left I saw that he did not receive a tetanus shot from the ER I recommend a tetanus shot our office will connect with the patient and have him come by for a tetanus shot

## 2015-07-31 NOTE — Progress Notes (Signed)
Please go ahead and put into Epic that he had a tetanus shot 1-1/2 years ago. I would suggest going ahead and estimating this as being 02/01/2014

## 2015-08-04 ENCOUNTER — Ambulatory Visit: Payer: 59 | Admitting: Family Medicine

## 2015-08-07 ENCOUNTER — Encounter: Payer: Self-pay | Admitting: Family Medicine

## 2015-08-07 ENCOUNTER — Ambulatory Visit (INDEPENDENT_AMBULATORY_CARE_PROVIDER_SITE_OTHER): Payer: PRIVATE HEALTH INSURANCE | Admitting: Family Medicine

## 2015-08-07 ENCOUNTER — Ambulatory Visit (HOSPITAL_COMMUNITY)
Admission: RE | Admit: 2015-08-07 | Discharge: 2015-08-07 | Disposition: A | Discharge status: Home / Self Care | Payer: PRIVATE HEALTH INSURANCE | Source: Ambulatory Visit | Attending: Family Medicine | Admitting: Family Medicine

## 2015-08-07 VITALS — BP 130/80 | Ht 71.5 in | Wt 180.4 lb

## 2015-08-07 DIAGNOSIS — X58XXXD Exposure to other specified factors, subsequent encounter: Secondary | ICD-10-CM | POA: Diagnosis not present

## 2015-08-07 DIAGNOSIS — S61219D Laceration without foreign body of unspecified finger without damage to nail, subsequent encounter: Secondary | ICD-10-CM

## 2015-08-07 NOTE — Progress Notes (Signed)
   Subjective:    Patient ID: Christopher [name], male    DOB: 08/28/1960, 55 y.o.   MRN: [id number]  Laceration  The incident occurred more than 1 week ago. The laceration is located on the left hand. The laceration mechanism was a metal edge.    Patient in today for 1 week follow up on laceration to middle finger of left hand. States no other concerns this visit.  Review of Systems Patient relates soreness pain discomfort denies fever chills drainage    Objective:   Physical Exam Finger laceration seems to be healing fairly well there is a little bit of dead skin around the stellate cut this could take weeks to heal up function of the finger seems to be good but he has some difficulty flexing the distal tip but he can flex it is just sore  Assessment and plan-laceration seems to be healing well sutures are reabsorbing on their own no sign of infection this could take weeks to heal up he is to keep it clean whenever doing any type of activity for which he could get dirty x-ray recommended to make sure that there is no risk of osteomyelitis follow-up when necessary or if problems

## 2015-10-01 ENCOUNTER — Encounter: Payer: Self-pay | Admitting: Family Medicine

## 2015-10-29 ENCOUNTER — Other Ambulatory Visit: Payer: Self-pay | Admitting: *Deleted

## 2015-11-03 ENCOUNTER — Telehealth: Payer: Self-pay | Admitting: *Deleted

## 2015-11-03 ENCOUNTER — Other Ambulatory Visit: Payer: Self-pay | Admitting: *Deleted

## 2015-11-03 MED ORDER — METOPROLOL TARTRATE 25 MG PO TABS: 25.0000 mg | ORAL_TABLET | Freq: Two times a day (BID) | ORAL | 5 refills | Status: DC

## 2015-11-03 NOTE — Telephone Encounter (Signed)
[facility] needing to change pt's atenolol. Pharm states it is on back order. Not available in any form or dose. Pt last filled on 8/30 for a 30 day supply.

## 2015-11-03 NOTE — Telephone Encounter (Signed)
Discussed with pt that atenolol is on backorder and med has been changed to metoprolol 25 mg one bid. Pt verbalized understanding. Med sent to pharm.

## 2015-11-03 NOTE — Telephone Encounter (Signed)
Metoprolol tartrate 25 mg 1 twice a day, #60, 5 refills in place of atenolol

## 2015-11-25 ENCOUNTER — Encounter: Payer: Self-pay | Admitting: Family Medicine

## 2015-11-25 ENCOUNTER — Ambulatory Visit (INDEPENDENT_AMBULATORY_CARE_PROVIDER_SITE_OTHER): Payer: PRIVATE HEALTH INSURANCE | Admitting: Family Medicine

## 2015-11-25 VITALS — BP 120/80 | Temp 98.7°F | Ht 71.5 in | Wt 185.1 lb

## 2015-11-25 DIAGNOSIS — M5010 Cervical disc disorder with radiculopathy, unspecified cervical region: Secondary | ICD-10-CM

## 2015-11-25 MED ORDER — PREDNISONE 20 MG PO TABS: ORAL_TABLET | ORAL | 0 refills | Status: DC

## 2015-11-25 MED ORDER — OXYCODONE-ACETAMINOPHEN 5-325 MG PO TABS: 1.0000 | ORAL_TABLET | ORAL | 0 refills | Status: DC | PRN

## 2015-11-25 NOTE — Progress Notes (Signed)
   Subjective:    Patient ID: Christopher [name], male    DOB: 12-24-1960, 55 y.o.   MRN: [id number]  Neck Pain   This is a new problem. The current episode started 1 to 4 weeks ago. The problem occurs intermittently. The problem has been unchanged. The pain is associated with an unknown factor. The quality of the pain is described as aching. The pain is moderate. The pain is same all the time. Stiffness is present all day. He has tried acetaminophen for the symptoms. The treatment provided no relief.   Patient has no other concerns at this time.    Review of Systems  Musculoskeletal: Positive for neck pain.   He describes pain going down into the arm describe some numbness into the hand. Denies any injury. Denies shoulder pain. Denies chest tightness pressure pain shortness breath.    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 I recommend going forward with prednisone taper, Percocet as necessary for pain caution drowsiness if not doing dramatically better in the next 7-10 days notify us.

## 2015-12-09 ENCOUNTER — Telehealth: Payer: Self-pay | Admitting: Family Medicine

## 2015-12-09 NOTE — Telephone Encounter (Signed)
One refill of pain medication allow. Oxycodone No. 30 previous directions please-caution drowsiness

## 2015-12-09 NOTE — Telephone Encounter (Signed)
Pt is requesting a refill on oxyCODONE-acetaminophen

## 2015-12-10 MED ORDER — OXYCODONE-ACETAMINOPHEN 5-325 MG PO TABS: 1.0000 | ORAL_TABLET | ORAL | 0 refills | Status: DC | PRN

## 2015-12-10 NOTE — Telephone Encounter (Signed)
Patient notified script ready for pick up

## 2015-12-18 ENCOUNTER — Telehealth: Payer: Self-pay | Admitting: *Deleted

## 2015-12-18 NOTE — Telephone Encounter (Signed)
Oxycodone-acetaminophen 5/325 mg approved for short term treatment. Approval valid 12/17/15-12/22/15. Pharmacy notified.

## 2016-01-27 ENCOUNTER — Other Ambulatory Visit: Payer: Self-pay | Admitting: *Deleted

## 2016-01-27 ENCOUNTER — Telehealth: Payer: Self-pay | Admitting: Family Medicine

## 2016-01-27 MED ORDER — OXYCODONE-ACETAMINOPHEN 5-325 MG PO TABS: 1.0000 | ORAL_TABLET | ORAL | 0 refills | Status: DC | PRN

## 2016-01-27 NOTE — Telephone Encounter (Signed)
He may have a refill on this medication. Because this medication is a class II scheduled drug before the next refill he would have to do an office visit

## 2016-01-27 NOTE — Telephone Encounter (Signed)
Pt is requesting a refill on his pain medication. 

## 2016-01-27 NOTE — Telephone Encounter (Signed)
Script up front. Tried to call no answer.

## 2016-01-27 NOTE — Telephone Encounter (Signed)
Pt last seen for neck pain on 10/24. Pt states he is still having pain. Last script given on 12/10/15 for oxycodone #30

## 2016-01-28 NOTE — Telephone Encounter (Signed)
Pt notified script ready for pick up.

## 2016-03-09 ENCOUNTER — Encounter: Payer: Self-pay | Admitting: Family Medicine

## 2016-03-09 ENCOUNTER — Ambulatory Visit (INDEPENDENT_AMBULATORY_CARE_PROVIDER_SITE_OTHER): Payer: PRIVATE HEALTH INSURANCE | Admitting: Family Medicine

## 2016-03-09 VITALS — BP 122/80 | Ht 71.5 in | Wt 184.1 lb

## 2016-03-09 DIAGNOSIS — M5010 Cervical disc disorder with radiculopathy, unspecified cervical region: Secondary | ICD-10-CM | POA: Diagnosis not present

## 2016-03-09 DIAGNOSIS — M25561 Pain in right knee: Secondary | ICD-10-CM | POA: Diagnosis not present

## 2016-03-09 MED ORDER — OXYCODONE-ACETAMINOPHEN 5-325 MG PO TABS: 1.0000 | ORAL_TABLET | ORAL | 0 refills | Status: DC | PRN

## 2016-03-09 MED ORDER — MELOXICAM 15 MG PO TABS: 15.0000 mg | ORAL_TABLET | Freq: Every day | ORAL | 0 refills | Status: DC

## 2016-03-09 NOTE — Progress Notes (Signed)
   Subjective:    Patient ID: Christopher [name], male    DOB: 1960/10/20, 56 y.o.   MRN: [id number]  Knee Pain   The incident occurred more than 1 week ago. The injury mechanism is unknown. The pain is present in the right knee. The quality of the pain is described as aching and stabbing. The pain is moderate. The pain has been intermittent since onset. He reports no foreign bodies present. The symptoms are aggravated by movement. He has tried NSAIDs for the symptoms. The treatment provided no relief.  Neck Pain   This is a new problem. The current episode started more than 1 month ago. The problem occurs intermittently. The problem has been unchanged. The pain is associated with nothing. The quality of the pain is described as aching. The pain is moderate. Stiffness is present all day. He has tried NSAIDs for the symptoms. The treatment provided no relief.  The patient relates that he was getting out of the back of a truck when his foot caught on the tailgate caused his leg been up underneath the medicines and is not been able to straighten his leg or bend properly  Patient also relates 6 months of neck pain he was seen last October the pain radiates up into the side of his neck into the side of his head in the posterior portion as well as radiating into the trapezius deltoid and down into the hand he also relates sometimes the pain is severe enough to cause weakness and not arm and hand he denies any injury. States anti-inflammatories did not help Has had take oxycodone multiple different times over the past several months Areas getting worse causing him to wake up at night as well.    Review of Systems  Musculoskeletal: Positive for neck pain.  Please see above denies fever chills cough wheezing vomiting diarrhea     Objective:   Physical Exam Neurologically he has tenderness on the right side of his neck reflex is slightly diminished in the right arm strength is good in the right arm compared  to the left arm. Lungs are clear hearts regular his right knee has diminished range of motion       Assessment & Plan:  Knee pain-referral to orthopedics possibility of a cartilage tear  Persistent cervical pain and discomfort on the right side of the neck radiating into the head as well as down the arm present for 6 months getting worse despite anti-inflammatory and pain medication recommend MRI of the cervical spine. May need referral to specialist depending on what we find

## 2016-03-12 ENCOUNTER — Ambulatory Visit (HOSPITAL_COMMUNITY)
Admission: RE | Admit: 2016-03-12 | Discharge: 2016-03-12 | Disposition: A | Discharge status: Home / Self Care | Payer: PRIVATE HEALTH INSURANCE | Source: Ambulatory Visit | Attending: Family Medicine | Admitting: Family Medicine

## 2016-03-12 DIAGNOSIS — M50221 Other cervical disc displacement at C4-C5 level: Secondary | ICD-10-CM | POA: Insufficient documentation

## 2016-03-12 DIAGNOSIS — M2578 Osteophyte, vertebrae: Secondary | ICD-10-CM | POA: Insufficient documentation

## 2016-03-12 DIAGNOSIS — M5010 Cervical disc disorder with radiculopathy, unspecified cervical region: Secondary | ICD-10-CM | POA: Diagnosis present

## 2016-03-15 ENCOUNTER — Telehealth: Payer: Self-pay | Admitting: Family Medicine

## 2016-03-15 ENCOUNTER — Encounter: Payer: Self-pay | Admitting: Family Medicine

## 2016-03-15 NOTE — Telephone Encounter (Signed)
Please see result note regarding MRI, please put him referral for neurosurgery and inform the patient of MRI results and recommendation for referral

## 2016-03-15 NOTE — Telephone Encounter (Signed)
Need referral initiated in system for neurosurgery so that I may process  (current referral is for ortho for pt's R knee pain)

## 2016-03-16 NOTE — Telephone Encounter (Signed)
Results discussed with patient. Patient advised the MRI shows some spurring as well as disc bulging and a small disc herniation also shows degenerative changes. Dr Nicki Reaper believes the patient would benefit from seeing a neurosurgeon for further opinion they may recommend injections possibility of recommending surgery does not appear to be emergent but Dr Nicki Reaper recommends the patient go ahead with referral to neurosurgery in [facility] for further opinion(please inform the patient of the referral process-[facility] will contact him with appointment-typically take several weeks to get them with neurosurgery) should the patient need to follow-up with [place] in the meantime that would be fine. Patient verbalized understanding and referral ordered in EPIC

## 2016-03-16 NOTE — Addendum Note (Signed)
Addended by: Dairl Ponder on: 03/16/2016 03:36 PM   Modules accepted: Orders

## 2016-03-18 ENCOUNTER — Encounter: Payer: Self-pay | Admitting: Family Medicine

## 2016-04-30 ENCOUNTER — Telehealth: Payer: Self-pay | Admitting: Family Medicine

## 2016-04-30 ENCOUNTER — Other Ambulatory Visit: Payer: Self-pay | Admitting: *Deleted

## 2016-04-30 MED ORDER — OXYCODONE-ACETAMINOPHEN 5-325 MG PO TABS: 1.0000 | ORAL_TABLET | ORAL | 0 refills | Status: DC | PRN

## 2016-04-30 NOTE — Telephone Encounter (Signed)
Last seen for knee pain 2/6. Given rx for oxycodone 5/325 #40 one q 4 hours.

## 2016-04-30 NOTE — Telephone Encounter (Signed)
Requesting Rx for pain medication for his knee.  He said we sent him to a pain specialist but they had to reschedule his appointment to 05/29/16.

## 2016-04-30 NOTE — Telephone Encounter (Signed)
Ok times one 

## 2016-04-30 NOTE — Telephone Encounter (Signed)
Pt notified. Script ready for pickup.  

## 2016-05-13 ENCOUNTER — Other Ambulatory Visit: Payer: Self-pay | Admitting: Family Medicine

## 2016-06-06 ENCOUNTER — Emergency Department (HOSPITAL_COMMUNITY): Payer: PRIVATE HEALTH INSURANCE

## 2016-06-06 ENCOUNTER — Encounter (HOSPITAL_COMMUNITY): Payer: Self-pay

## 2016-06-06 ENCOUNTER — Observation Stay (HOSPITAL_COMMUNITY)
Admission: EM | Admit: 2016-06-06 | Discharge: 2016-06-07 | Disposition: A | Discharge status: Home / Self Care | Payer: PRIVATE HEALTH INSURANCE | Attending: Internal Medicine | Admitting: Internal Medicine

## 2016-06-06 DIAGNOSIS — I10 Essential (primary) hypertension: Secondary | ICD-10-CM | POA: Diagnosis not present

## 2016-06-06 DIAGNOSIS — Z8249 Family history of ischemic heart disease and other diseases of the circulatory system: Secondary | ICD-10-CM | POA: Diagnosis not present

## 2016-06-06 DIAGNOSIS — Z7982 Long term (current) use of aspirin: Secondary | ICD-10-CM | POA: Insufficient documentation

## 2016-06-06 DIAGNOSIS — R079 Chest pain, unspecified: Secondary | ICD-10-CM | POA: Diagnosis not present

## 2016-06-06 DIAGNOSIS — R0789 Other chest pain: Secondary | ICD-10-CM | POA: Diagnosis present

## 2016-06-06 DIAGNOSIS — E785 Hyperlipidemia, unspecified: Secondary | ICD-10-CM | POA: Diagnosis not present

## 2016-06-06 DIAGNOSIS — Z79899 Other long term (current) drug therapy: Secondary | ICD-10-CM | POA: Diagnosis not present

## 2016-06-06 LAB — CBC
HCT: 43.6 % (ref 39.0–52.0)
Hemoglobin: 15.2 g/dL (ref 13.0–17.0)
MCH: 30.5 pg (ref 26.0–34.0)
MCHC: 34.9 g/dL (ref 30.0–36.0)
MCV: 87.6 fL (ref 78.0–100.0)
PLATELETS: 203 10*3/uL (ref 150–400)
RBC: 4.98 MIL/uL (ref 4.22–5.81)
RDW: 12.8 % (ref 11.5–15.5)
WBC: 5.3 10*3/uL (ref 4.0–10.5)

## 2016-06-06 LAB — COMPREHENSIVE METABOLIC PANEL
ALBUMIN: 4.1 g/dL (ref 3.5–5.0)
ALT: 18 U/L (ref 17–63)
AST: 16 U/L (ref 15–41)
Alkaline Phosphatase: 79 U/L (ref 38–126)
Anion gap: 7 (ref 5–15)
BUN: 16 mg/dL (ref 6–20)
CALCIUM: 9.3 mg/dL (ref 8.9–10.3)
CO2: 30 mmol/L (ref 22–32)
Chloride: 106 mmol/L (ref 101–111)
Creatinine, Ser: 1.25 mg/dL — ABNORMAL HIGH (ref 0.61–1.24)
GFR calc Af Amer: >60 mL/min (ref >60)
GFR calc non Af Amer: >60 mL/min (ref >60)
GLUCOSE: 97 mg/dL (ref 65–99)
POTASSIUM: 4.2 mmol/L (ref 3.5–5.1)
SODIUM: 143 mmol/L (ref 135–145)
TOTAL PROTEIN: 7.4 g/dL (ref 6.5–8.1)
Total Bilirubin: 0.8 mg/dL (ref 0.3–1.2)

## 2016-06-06 LAB — TROPONIN I
Troponin I: <0.03 ng/mL (ref <0.03)
Troponin I: <0.03 ng/mL (ref <0.03)

## 2016-06-06 LAB — TSH: TSH: 0.741 u[IU]/mL (ref 0.350–4.500)

## 2016-06-06 MED ORDER — NITROGLYCERIN 0.4 MG SL SUBL: 0.4000 mg | SUBLINGUAL_TABLET | SUBLINGUAL | Status: DC | PRN

## 2016-06-06 MED ORDER — SODIUM CHLORIDE 0.9% FLUSH
3.0000 mL | Freq: Two times a day (BID) | INTRAVENOUS | Status: DC
  Administered 2016-06-06 – 2016-06-07 (×2): 3 mL via INTRAVENOUS

## 2016-06-06 MED ORDER — METOPROLOL TARTRATE 25 MG PO TABS
25.0000 mg | ORAL_TABLET | Freq: Two times a day (BID) | ORAL | Status: DC
  Administered 2016-06-06 – 2016-06-07 (×2): 25 mg via ORAL
  Filled 2016-06-06 (×2): qty 1

## 2016-06-06 MED ORDER — LISINOPRIL 5 MG PO TABS
5.0000 mg | ORAL_TABLET | Freq: Every day | ORAL | Status: DC
  Administered 2016-06-06 – 2016-06-07 (×2): 5 mg via ORAL
  Filled 2016-06-06 (×2): qty 1

## 2016-06-06 MED ORDER — OXYCODONE-ACETAMINOPHEN 5-325 MG PO TABS: 1.0000 | ORAL_TABLET | ORAL | Status: DC | PRN

## 2016-06-06 MED ORDER — ATORVASTATIN CALCIUM 40 MG PO TABS
40.0000 mg | ORAL_TABLET | Freq: Every day | ORAL | Status: DC
  Administered 2016-06-07: 40 mg via ORAL
  Filled 2016-06-06: qty 1

## 2016-06-06 MED ORDER — SODIUM CHLORIDE 0.9% FLUSH: 3.0000 mL | INTRAVENOUS | Status: DC | PRN

## 2016-06-06 MED ORDER — ACETAMINOPHEN 325 MG PO TABS
650.0000 mg | ORAL_TABLET | ORAL | Status: DC | PRN
  Administered 2016-06-07: 650 mg via ORAL
  Filled 2016-06-06: qty 2

## 2016-06-06 MED ORDER — SODIUM CHLORIDE 0.9 % IV SOLN: 250.0000 mL | INTRAVENOUS | Status: DC | PRN

## 2016-06-06 MED ORDER — ONDANSETRON HCL 4 MG/2ML IJ SOLN: 4.0000 mg | Freq: Four times a day (QID) | INTRAMUSCULAR | Status: DC | PRN

## 2016-06-06 MED ORDER — NITROGLYCERIN 0.4 MG SL SUBL
SUBLINGUAL_TABLET | SUBLINGUAL | Status: AC
  Filled 2016-06-06: qty 1

## 2016-06-06 MED ORDER — NITROGLYCERIN 0.4 MG SL SUBL
0.4000 mg | SUBLINGUAL_TABLET | SUBLINGUAL | Status: DC | PRN
  Administered 2016-06-06 (×3): 0.4 mg via SUBLINGUAL
  Filled 2016-06-06: qty 1

## 2016-06-06 MED ORDER — ENOXAPARIN SODIUM 40 MG/0.4ML ~~LOC~~ SOLN
40.0000 mg | SUBCUTANEOUS | Status: DC
  Administered 2016-06-07: 40 mg via SUBCUTANEOUS
  Filled 2016-06-06: qty 0.4

## 2016-06-06 MED ORDER — NITROGLYCERIN 0.4 MG SL SUBL
0.4000 mg | SUBLINGUAL_TABLET | Freq: Once | SUBLINGUAL | Status: AC
  Administered 2016-06-06: 0.4 mg via SUBLINGUAL

## 2016-06-06 MED ORDER — ASPIRIN EC 81 MG PO TBEC
81.0000 mg | DELAYED_RELEASE_TABLET | Freq: Every day | ORAL | Status: DC
  Administered 2016-06-07: 81 mg via ORAL
  Filled 2016-06-06: qty 1

## 2016-06-06 NOTE — ED Notes (Signed)
Pt returned from xray

## 2016-06-06 NOTE — ED Notes (Signed)
Spoke with Dr. Lorin Mercy concerning that [facility] will not accept pt due to active CP, she stated that she will change it to [facility]

## 2016-06-06 NOTE — ED Notes (Signed)
ED Provider at bedside. 

## 2016-06-06 NOTE — ED Notes (Signed)
EDP made aware of pt's CP back up to 8/10

## 2016-06-06 NOTE — ED Notes (Signed)
Given 1 sl ntg for cp per verbal order by dr Lacinda Axon

## 2016-06-06 NOTE — ED Triage Notes (Signed)
Pt reports left chest pain since 0630. Pain increase with raising left arm. Described as someone standing on chest . Denies SOB, nausea, or lightheadedness

## 2016-06-06 NOTE — ED Notes (Signed)
Patient transported to X-ray 

## 2016-06-06 NOTE — H&P (Signed)
History and Physical    Christopher [name] [id number] DOB: [birth year] DOA: 06/06/2016  PCP: Kathyrn Drown, MD Consultants:  None Patient coming from: home - lives with wife and son; Sarah Ann: wife, [phone]  Chief Complaint: chest pain  HPI: Christopher [name] is a 56 y.o. male with medical history significant of HTN and HLD presenting with severe left-sided chest pain all day, like someone is stepping on his chest.  Pain started about 0630, worse about 0800.  The pain woke him up from sleep.  No SOB, nausea, diaphoresis.  It got worse with walking and then worse again while lying down.  NTG made it better and then it came back about 30 minutes later.  No prior stress test or catheterization.  Brother with MI at 75, father at 58.   ED Course:  Moderate cardiac RF, pain improved with NTG.  Called cardiology, Dr. Wynonia Lawman, who recommended observation at [facility] and AM cardiology consultation.  Review of Systems: As per HPI; otherwise review of systems reviewed and negative.   Ambulatory Status:  Ambulates without assistance  Past Medical History:  Diagnosis Date  . HTN (hypertension)   . Hyperlipemia     Past Surgical History:  Procedure Laterality Date  . HERNIA REPAIR    . KNEE ARTHROSCOPY  right knee    Social History   Social History  . Marital status: Married    Spouse name: N/A  . Number of children: N/A  . Years of education: 67   Occupational History  . truck driver [employer]   Social History Main Topics  . Smoking status: Never Smoker  . Smokeless tobacco: Never Used  . Alcohol use Yes     Comment: occasional beer  . Drug use: No  . Sexual activity: Not on file   Other Topics Concern  . Not on file   Social History Narrative  . No narrative on file    Allergies  Allergen Reactions  . Hydrocodone Itching    Family History  Problem Relation Age of Onset  . Arthritis    . Cancer    . Diabetes    . Other Mother 94    sepsis  . CAD Father 73    . CAD Brother 54  . Diabetes Brother     Prior to Admission medications   Medication Sig Start Date End Date Taking? Authorizing Provider  aspirin EC 81 MG tablet Take 81 mg by mouth daily.   Yes [provider]  atorvastatin (LIPITOR) 10 MG tablet TAKE 1 TABLET(10 MG) BY MOUTH DAILY 05/13/16  Yes [name], [name], MD  meloxicam (MOBIC) 15 MG tablet Take 1 tablet (15 mg total) by mouth daily. Patient taking differently: Take 15 mg by mouth daily as needed for pain.  03/09/16  Yes Kathyrn Drown, MD  metoprolol tartrate (LOPRESSOR) 25 MG tablet Take 1 tablet (25 mg total) by mouth 2 (two) times daily. 11/03/15  Yes Kathyrn Drown, MD  oxyCODONE-acetaminophen (PERCOCET/ROXICET) 5-325 MG tablet Take 1 tablet by mouth every 4 (four) hours as needed. Patient taking differently: Take 1 tablet by mouth every 4 (four) hours as needed for severe pain.  04/30/16  Yes Mikey Kirschner, MD    Physical Exam: Vitals:   06/06/16 2000 06/06/16 2110 06/06/16 2115 06/06/16 2130  BP: (!) 166/104 (!) 173/108    Pulse: (!) 48 (!) 52 (!) 53 (!) 54  Resp: (!) 21  20 12   Temp:  TempSrc:      SpO2: 98% 99% 99% 100%  Weight:      Height:         General:  Appears calm and comfortable and is NAD; continues to c/o left-sided chest pain Eyes:  PERRL, EOMI, normal lids, iris ENT:  grossly normal hearing, lips & tongue, mmm Neck:  no LAD, masses or thyromegaly Cardiovascular:  RRR, no m/r/g. No LE edema. Chest pain is not reproducible.   Respiratory:  CTA bilaterally, no w/r/r. Normal respiratory effort. Abdomen:  soft, ntnd, NABS Skin:  no rash or induration seen on limited exam Musculoskeletal:  grossly normal tone BUE/BLE, good ROM, no bony abnormality Psychiatric:  grossly normal mood and affect, speech fluent and appropriate, AOx3 Neurologic:  CN 2-12 grossly intact, moves all extremities in coordinated fashion, sensation intact  Labs on Admission: I have personally reviewed following labs  and imaging studies  CBC:  Recent Labs Lab 06/06/16 1606  WBC 5.3  HGB 15.2  HCT 43.6  MCV 87.6  PLT 161   Basic Metabolic Panel:  Recent Labs Lab 06/06/16 1606  NA 143  K 4.2  CL 106  CO2 30  GLUCOSE 97  BUN 16  CREATININE 1.25*  CALCIUM 9.3   GFR: Estimated Creatinine Clearance: 72 mL/min (A) (by C-G formula based on SCr of 1.25 mg/dL (H)). Liver Function Tests:  Recent Labs Lab 06/06/16 1606  AST 16  ALT 18  ALKPHOS 79  BILITOT 0.8  PROT 7.4  ALBUMIN 4.1   No results for input(s): LIPASE, AMYLASE in the last 168 hours. No results for input(s): AMMONIA in the last 168 hours. Coagulation Profile: No results for input(s): INR, PROTIME in the last 168 hours. Cardiac Enzymes:  Recent Labs Lab 06/06/16 1606  TROPONINI <0.03   BNP (last 3 results) No results for input(s): PROBNP in the last 8760 hours. HbA1C: No results for input(s): HGBA1C in the last 72 hours. CBG: No results for input(s): GLUCAP in the last 168 hours. Lipid Profile: No results for input(s): CHOL, HDL, LDLCALC, TRIG, CHOLHDL, LDLDIRECT in the last 72 hours. Thyroid Function Tests: No results for input(s): TSH, T4TOTAL, FREET4, T3FREE, THYROIDAB in the last 72 hours. Anemia Panel: No results for input(s): VITAMINB12, FOLATE, FERRITIN, TIBC, IRON, RETICCTPCT in the last 72 hours. Urine analysis: No results found for: COLORURINE, APPEARANCEUR, LABSPEC, PHURINE, GLUCOSEU, HGBUR, BILIRUBINUR, KETONESUR, PROTEINUR, UROBILINOGEN, NITRITE, LEUKOCYTESUR  Creatinine Clearance: Estimated Creatinine Clearance: 72 mL/min (A) (by C-G formula based on SCr of 1.25 mg/dL (H)).  Sepsis Labs: @LABRCNTIP (procalcitonin:4,lacticidven:4) )No results found for this or any previous visit (from the past 240 hour(s)).   Radiological Exams on Admission: Dg Chest 2 View  Result Date: 06/06/2016 CLINICAL DATA:  Chest pain. EXAM: CHEST  2 VIEW COMPARISON:  04/12/2013 FINDINGS: Lateral view degraded by  patient arm position. Minimal motion degradation involving the lateral view. Midline trachea. Borderline cardiomegaly. No pleural effusion or pneumothorax. Clear lungs. IMPRESSION: No acute cardiopulmonary disease. Electronically Signed   By: Abigail Miyamoto M.D.   On: 06/06/2016 16:55    EKG: Unable to review in Epic at this time.  Per Dr. Lacinda Axon, NSR rate 51, normal EKG.  Assessment/Plan Principal Problem:   Chest pain Active Problems:   Hyperlipemia   HTN (hypertension), benign   Chest pain -Patient with left-sided sharp chest pain that has been steady throughout the day, not clearly exertional, but improved with NTG. -1/3 typical symptoms suggestive of noncardiac chest pain.  -CXR unremarkable.   -Initial  cardiac troponin negative. -EKG not indicative of acute ischemia.   -GRACE score is 64; which predicts an in-hospital death rate of 0.2%.  -Will plan to place in observation status on telemetry to rule out ACS by overnight observation.  -After discussion of risks/benefits of remaining at [facility] vs. transfer to [facility], the patient and his wife requested transfer for more availability to cardiology and catheterization if needed. -cycle troponin q6h x 3 and repeat EKG in AM -Continue ASA 81 mg  daily -morphine given -Risk factor stratification with FLP; will also check TSH and UDS -Normal glucose, will not check A1c at this time. -Cardiology consultation in AM - NPO for possible stress test; request made through [employer] inbox message -Will plan to start Heparin drip if enzymes are positive   HTN -Takes Lopressor monotherapy at home -Patient with suboptimal control while in the ER -Will add Lisinopril 5 mg PO daily (of note, creatinine is increased to 1.25, prior 1.06 in 5/17, 1.14 in 3/15; Lisinopril will also help with renal protection)  HLD -Lipids were checked in 5/17 (TC 190, HDL 59, LDL 114, TG 87); repeat lab pending -Continue Lipitor but increase to 40 mg daily for  now    DVT prophylaxis:  Lovenox Code Status:  Full - confirmed with patient/family Family Communication: Wife present throughout evaluation Disposition Plan:  Home once clinically improved Consults called: Cardiology (by telephone from [facility] ER as well as via [place] message)  Admission status: It is my clinical opinion that referral for OBSERVATION is reasonable and necessary in this patient based on the above information provided. The aforementioned taken together are felt to place the patient at high risk for further clinical deterioration. However it is anticipated that the patient may be medically stable for discharge from the hospital within 24 to 48 hours.    Karmen Bongo MD [facility]  If 7PM-7AM, please contact night-coverage [email or web address] Password [email or web address]  06/06/2016, 9:51 PM

## 2016-06-06 NOTE — ED Provider Notes (Signed)
[facility] DEPT Provider Note   CSN: [id number] Arrival date & time: 06/06/16  1546     History   Chief Complaint Chief Complaint  Patient presents with  . Chest Pain    HPI Christopher [name] is a 56 y.o. male.  Left-sided chest pain for 2 days intermittently described as "stepping on my chest" with radiation to the shoulder, worse with activity. No dyspnea, diaphoresis, nausea. Past medical history includes hypertension and hypercholesterolemia. No diabetes. Brother had an MI at 53 and father at 65. Severity of symptoms is mild to moderate.      Past Medical History:  Diagnosis Date  . HTN (hypertension)   . Hyperlipemia     Patient Active Problem List   Diagnosis Date Noted  . Hyperlipemia 04/16/2013  . HTN (hypertension), benign 04/16/2013  . Impingement syndrome of right shoulder 03/08/2011    Past Surgical History:  Procedure Laterality Date  . HERNIA REPAIR    . KNEE ARTHROSCOPY  right knee       Home Medications    Prior to Admission medications   Medication Sig Start Date End Date Taking? Authorizing Provider  aspirin EC 81 MG tablet Take 81 mg by mouth daily.    [provider]  atorvastatin (LIPITOR) 10 MG tablet TAKE 1 TABLET(10 MG) BY MOUTH DAILY 05/13/16   Kathyrn Drown, MD  meloxicam (MOBIC) 15 MG tablet Take 1 tablet (15 mg total) by mouth daily. 03/09/16   Kathyrn Drown, MD  metoprolol tartrate (LOPRESSOR) 25 MG tablet Take 1 tablet (25 mg total) by mouth 2 (two) times daily. 11/03/15   Kathyrn Drown, MD  oxyCODONE-acetaminophen (PERCOCET/ROXICET) 5-325 MG tablet Take 1 tablet by mouth every 4 (four) hours as needed. 04/30/16   Mikey Kirschner, MD    Family History Family History  Problem Relation Age of Onset  . Arthritis    . Cancer    . Diabetes      Social History Social History  Substance Use Topics  . Smoking status: Never Smoker  . Smokeless tobacco: Never Used  . Alcohol use Yes     Comment: occasional beer      Allergies   Hydrocodone   Review of Systems Review of Systems  All other systems reviewed and are negative.    Physical Exam Updated Vital Signs BP (!) 146/94   Pulse (!) 50   Temp 97.9 F (36.6 C) (Oral)   Resp 20   Ht 5\' 11"  (1.803 m)   Wt 185 lb (83.9 kg)   SpO2 99%   BMI 25.80 kg/m   Physical Exam  Constitutional: [name] oriented to person, place, and time. He appears well-developed and well-nourished.  HENT:  Head: Normocephalic and atraumatic.  Eyes: Conjunctivae are normal.  Neck: Neck supple.  Cardiovascular: Normal rate and regular rhythm.   Pulmonary/Chest: Effort normal and breath sounds normal.  Abdominal: Soft. Bowel sounds are normal.  Musculoskeletal: Normal range of motion.  Neurological: [name] alert and oriented to person, place, and time.  Skin: Skin is warm and dry.  Psychiatric: He has a normal mood and affect. His behavior is normal.  Nursing note and vitals reviewed.    ED Treatments / Results  Labs (all labs ordered are listed, but only abnormal results are displayed) Labs Reviewed  COMPREHENSIVE METABOLIC PANEL - Abnormal; Notable for the following:       Result Value   Creatinine, Ser 1.25 (*)    All other components  within normal limits  CBC  TROPONIN I    EKG  EKG Interpretation None      Date: 06/06/2016  Rate: 51  Rhythm: sinus brady  QRS Axis: normal  Intervals: normal  ST/T Wave abnormalities: normal  Conduction Disutrbances: none  Narrative Interpretation: unremarkable     Radiology Dg Chest 2 View  Result Date: 06/06/2016 CLINICAL DATA:  Chest pain. EXAM: CHEST  2 VIEW COMPARISON:  04/12/2013 FINDINGS: Lateral view degraded by patient arm position. Minimal motion degradation involving the lateral view. Midline trachea. Borderline cardiomegaly. No pleural effusion or pneumothorax. Clear lungs. IMPRESSION: No acute cardiopulmonary disease. Electronically Signed   By: Abigail Miyamoto M.D.   On: 06/06/2016 16:55     Procedures Procedures (including critical care time)  Medications Ordered in ED Medications  nitroGLYCERIN (NITROSTAT) SL tablet 0.4 mg (0.4 mg Sublingual Given 06/06/16 1624)     Initial Impression / Assessment and Plan / ED Course  I have reviewed the triage vital signs and the nursing notes.  Pertinent labs & imaging results that were available during my care of the patient were reviewed by me and considered in my medical decision making (see chart for details).     Patient with moderate cardiac risk factors presents with chest pain.  Nitroglycerin seemed to help. Will discuss with cardiology and admit to general medicine.  Final Clinical Impressions(s) / ED Diagnoses   Final diagnoses:  Chest pain, unspecified type    New Prescriptions New Prescriptions   No medications on file     Nat Christen, MD 06/06/16 2124

## 2016-06-06 NOTE — ED Notes (Signed)
Dr. Lorin Mercy in room with pt

## 2016-06-06 NOTE — ED Notes (Signed)
Verbal order to give 2 sl ntg 5 min apart

## 2016-06-06 NOTE — ED Notes (Signed)
EDP made aware that 2 SL NTG did help with CP.

## 2016-06-07 ENCOUNTER — Observation Stay (HOSPITAL_COMMUNITY): Payer: PRIVATE HEALTH INSURANCE

## 2016-06-07 ENCOUNTER — Encounter (HOSPITAL_COMMUNITY): Payer: Self-pay | Admitting: Radiology

## 2016-06-07 DIAGNOSIS — I10 Essential (primary) hypertension: Secondary | ICD-10-CM | POA: Diagnosis not present

## 2016-06-07 DIAGNOSIS — R079 Chest pain, unspecified: Secondary | ICD-10-CM

## 2016-06-07 LAB — TROPONIN I: Troponin I: <0.03 ng/mL (ref <0.03)

## 2016-06-07 LAB — CBC
HCT: 42.3 % (ref 39.0–52.0)
Hemoglobin: 14.4 g/dL (ref 13.0–17.0)
MCH: 29.8 pg (ref 26.0–34.0)
MCHC: 34 g/dL (ref 30.0–36.0)
MCV: 87.4 fL (ref 78.0–100.0)
PLATELETS: 192 10*3/uL (ref 150–400)
RBC: 4.84 MIL/uL (ref 4.22–5.81)
RDW: 12.9 % (ref 11.5–15.5)
WBC: 5.5 10*3/uL (ref 4.0–10.5)

## 2016-06-07 LAB — BASIC METABOLIC PANEL
ANION GAP: 6 (ref 5–15)
BUN: 15 mg/dL (ref 6–20)
CALCIUM: 9 mg/dL (ref 8.9–10.3)
CHLORIDE: 104 mmol/L (ref 101–111)
CO2: 31 mmol/L (ref 22–32)
Creatinine, Ser: 1.19 mg/dL (ref 0.61–1.24)
GFR calc Af Amer: >60 mL/min (ref >60)
GFR calc non Af Amer: >60 mL/min (ref >60)
GLUCOSE: 106 mg/dL — AB (ref 65–99)
POTASSIUM: 3.8 mmol/L (ref 3.5–5.1)
Sodium: 141 mmol/L (ref 135–145)

## 2016-06-07 LAB — LIPID PANEL
CHOL/HDL RATIO: 3 ratio
Cholesterol: 176 mg/dL (ref 0–200)
HDL: 59 mg/dL (ref >40)
LDL CALC: 97 mg/dL (ref 0–99)
Triglycerides: 99 mg/dL (ref <150)
VLDL: 20 mg/dL (ref 0–40)

## 2016-06-07 LAB — RAPID URINE DRUG SCREEN, HOSP PERFORMED
Amphetamines: NOT DETECTED
Barbiturates: NOT DETECTED
Benzodiazepines: NOT DETECTED
Cocaine: NOT DETECTED
OPIATES: NOT DETECTED
Tetrahydrocannabinol: NOT DETECTED

## 2016-06-07 LAB — PROTIME-INR
INR: 1.04
PROTHROMBIN TIME: 13.6 s (ref 11.4–15.2)

## 2016-06-07 LAB — HIV ANTIBODY (ROUTINE TESTING W REFLEX): HIV Screen 4th Generation wRfx: NONREACTIVE

## 2016-06-07 MED ORDER — NITROGLYCERIN 0.4 MG SL SUBL
0.8000 mg | SUBLINGUAL_TABLET | Freq: Once | SUBLINGUAL | Status: AC
  Administered 2016-06-07: 0.8 mg via SUBLINGUAL

## 2016-06-07 MED ORDER — LISINOPRIL 5 MG PO TABS: 5.0000 mg | ORAL_TABLET | Freq: Every day | ORAL | 0 refills | Status: DC

## 2016-06-07 MED ORDER — NITROGLYCERIN 0.4 MG SL SUBL
SUBLINGUAL_TABLET | SUBLINGUAL | Status: AC
  Administered 2016-06-07: 0.4 mg
  Filled 2016-06-07: qty 2

## 2016-06-07 MED ORDER — IOPAMIDOL (ISOVUE-370) INJECTION 76%
INTRAVENOUS | Status: AC
  Administered 2016-06-07: 80 mL
  Filled 2016-06-07: qty 100

## 2016-06-07 NOTE — Progress Notes (Signed)
Patient's HR dropped to 39 at 1322, unsustained. Returned to baseline in 36s. Paged Dr. Karleen Hampshire to notify of event.

## 2016-06-07 NOTE — Consult Note (Signed)
CARDIOLOGY CONSULT NOTE       Patient ID: ANTON CHERAMIE MRN: [id number] DOB/AGE: [birth year] 56 y.o.  Admit date: 06/06/2016 Referring Physician: Karleen Hampshire Primary Physician: Kathyrn Drown, MD Primary Cardiologist: Harl Bowie Reason for Consultation: Chest Pain  Principal Problem:   Chest pain Active Problems:   Hyperlipemia   HTN (hypertension), benign   HPI:  56 y.o. previously seen by Dr Harl Bowie Normal ETT in 2015. Admitted and transferred from [facility] for [facility]P. CRF;s HTN and elevated lipids. Compliant with meds. Sudden onset 10/10 chest pain central like elephant sitting on chest. Lasted continuously for over 12 hours Brother MI age 105  Pain woke him from sleep Brief relief with nitro. Pain only 1/10 this am.  Pain not pleruitic or postional. No trauma CXR with NAD and normal mediastinum ECG with no acute changes and enzymes negative despite prolonged pain.    ROS All other systems reviewed and negative except as noted above  Past Medical History:  Diagnosis Date  . HTN (hypertension)   . Hyperlipemia     Family History  Problem Relation Age of Onset  . Arthritis    . Cancer    . Diabetes    . Other Mother 20    sepsis  . CAD Father 97  . CAD Brother 33  . Diabetes Brother     Social History   Social History  . Marital status: Married    Spouse name: N/A  . Number of children: N/A  . 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 Smoking status: Never Smoker  . Smokeless tobacco: Never Used  . Alcohol use Yes     Comment: occasional beer  . Drug use: No  . Sexual activity: Not on file   Other Topics Concern  . Not on file   Social History Narrative  . No narrative on file    Past Surgical History:  Procedure Laterality Date  . HERNIA REPAIR    . KNEE ARTHROSCOPY  right knee     . aspirin EC  81 mg Oral Daily  . atorvastatin  40 mg Oral q1800  . enoxaparin (LOVENOX) injection  40 mg  Subcutaneous Q24H  . lisinopril  5 mg Oral Daily  . metoprolol tartrate  25 mg Oral BID  . sodium chloride flush  3 mL Intravenous Q12H   . sodium chloride      Physical Exam: Blood pressure 124/67, pulse (!) 50, temperature 97.7 F (36.5 C), temperature source Oral, resp. rate 20, height 5\' 11"  (1.803 m), weight 181 lb (82.1 kg), SpO2 98 %.    Affect appropriate Healthy:  appears stated age 56: normal Neck supple with no adenopathy JVP normal no bruits no thyromegaly Lungs clear with no wheezing and good diaphragmatic motion Heart:  S1/S2 no murmur, no rub, gallop or click PMI normal Abdomen: benighn, BS positve, no tenderness, no AAA no bruit.  No HSM or HJR Distal pulses intact with no bruits No edema Neuro non-focal Skin warm and dry No muscular weakness   Labs:   Lab Results  Component Value Date   WBC 5.5 06/07/2016   HGB 14.4 06/07/2016   HCT 42.3 06/07/2016   MCV 87.4 06/07/2016   PLT 192 06/07/2016    Recent Labs Lab 06/06/16 1606 06/07/16 0440  NA 143 141  K 4.2 3.8  CL 106 104  CO2 30 31  BUN 16 15  CREATININE 1.25* 1.19  CALCIUM 9.3 9.0  PROT 7.4  --   BILITOT 0.8  --   ALKPHOS 79  --   ALT 18  --   AST 16  --   GLUCOSE 97 106*   Lab Results  Component Value Date   TROPONINI <0.03 06/07/2016    Lab Results  Component Value Date   CHOL 176 06/07/2016   CHOL 190 06/12/2015   CHOL 240 (H) 04/16/2013   Lab Results  Component Value Date   HDL 59 06/07/2016   HDL 59 06/12/2015   HDL 52 04/16/2013   Lab Results  Component Value Date   LDLCALC 97 06/07/2016   LDLCALC 114 06/12/2015   LDLCALC 146 (H) 04/16/2013   Lab Results  Component Value Date   TRIG 99 06/07/2016   TRIG 87 06/12/2015   TRIG 211 (H) 04/16/2013   Lab Results  Component Value Date   CHOLHDL 3.0 06/07/2016   CHOLHDL 3.2 06/12/2015   CHOLHDL 4.6 04/16/2013   No results found for: LDLDIRECT    Radiology: Dg Chest 2 View  Result Date:  06/06/2016 CLINICAL DATA:  Chest pain. EXAM: CHEST  2 VIEW COMPARISON:  04/12/2013 FINDINGS: Lateral view degraded by patient arm position. Minimal motion degradation involving the lateral view. Midline trachea. Borderline cardiomegaly. No pleural effusion or pneumothorax. Clear lungs. IMPRESSION: No acute cardiopulmonary disease. Electronically Signed   By: Abigail Miyamoto M.D.   On: 06/06/2016 16:55    EKG:SB normal    ASSESSMENT AND PLAN:  Chest Pain:  Normal ETT 2015. Despite prolonged pain negative troponin x 2 and normal ECG Given thin body habitus and bradycardia would be ideal candidate for cardiac CTA will try to arrange this am Needs 18 gauge antecubital iv  HTN: Well controlled.  Continue current medications and low sodium Dash type diet.    Chol:  On statin adjust dose based on calcium score  Signed: Jenkins Rouge 06/07/2016, 8:12 AM

## 2016-06-09 NOTE — Discharge Summary (Signed)
Physician Discharge Summary  Christopher [name] YBO:[id number] DOB: [birth year] DOA: 06/06/2016  PCP: Kathyrn Drown, MD  Admit date: 06/06/2016 Discharge date: 06/07/2016  Admitted From: Home Disposition:  HOme  Recommendations for Outpatient Follow-up:  1. Follow up with PCP in 1-2 weeks 2. Please obtain BMP/CBC in one week 3. Please follow up with cardiology as needed.     Discharge Condition:stable. CODE STATUS:full code.  Diet recommendation: Heart Healthy    Brief/Interim Summary: Christopher [name] is a 56 y.o. male with medical history significant of HTN and HLD presenting with severe left-sided chest pain all day, like someone is stepping on his chest.  he was admitted for evaluation of ACS.  cxr does not show any acute pathology.   Discharge Diagnoses:  Principal Problem:   Chest pain Active Problems:   Hyperlipemia   HTN (hypertension), benign  Atypical chest pain:  Resolved.cardiology consulted and he underwent CTA of the coronaries. It was negative.  Cardiac enzymes are negative.  EKG is NSR.  PT remained chest pain free since admission.    Hypertension; Well controlled.   Discharge Instructions  Discharge Instructions    Diet - low sodium heart healthy    Complete by:  As directed    Discharge instructions    Complete by:  As directed    Please follow up with PCP in one week, post hospitalization visit.     Allergies as of 06/07/2016      Reactions   Hydrocodone Itching      Medication List    STOP taking these medications   meloxicam 15 MG tablet Commonly known as:  MOBIC     TAKE these medications   aspirin EC 81 MG tablet Take 81 mg by mouth daily.   atorvastatin 10 MG tablet Commonly known as:  LIPITOR TAKE 1 TABLET(10 MG) BY MOUTH DAILY   lisinopril 5 MG tablet Commonly known as:  PRINIVIL,ZESTRIL Take 1 tablet (5 mg total) by mouth daily.   metoprolol tartrate 25 MG tablet Commonly known as:  LOPRESSOR Take 1 tablet (25 mg total)  by mouth 2 (two) times daily.   oxyCODONE-acetaminophen 5-325 MG tablet Commonly known as:  PERCOCET/ROXICET Take 1 tablet by mouth every 4 (four) hours as needed. What changed:  reasons to take this      Follow-up Information    [name], [name], MD. Schedule an appointment as soon as possible for a visit in 1 week(s).   Specialty:  Family Medicine Contact information: [place] [address] [phone]          Allergies  Allergen Reactions  . Hydrocodone Itching    Consultations:  Cardiology.    Procedures/Studies: Dg Chest 2 View  Result Date: 06/06/2016 CLINICAL DATA:  Chest pain. EXAM: CHEST  2 VIEW COMPARISON:  04/12/2013 FINDINGS: Lateral view degraded by patient arm position. Minimal motion degradation involving the lateral view. Midline trachea. Borderline cardiomegaly. No pleural effusion or pneumothorax. Clear lungs. IMPRESSION: No acute cardiopulmonary disease. Electronically Signed   By: Abigail Miyamoto M.D.   On: 06/06/2016 16:55   Ct Cardiac Morph/pulm Vein W/cm&w/o Ca Score  Addendum Date: 06/07/2016   ADDENDUM REPORT: 06/07/2016 11:55 CLINICAL DATA:  Chest pain EXAM: Cardiac CTA MEDICATIONS: Sub lingual nitro. 4mg  TECHNIQUE: The patient was scanned on a Siemens 154 slice scanner. Gantry rotation speed was 300 msecs. Collimation was.6 mm . A 120 kV retrospective scan was triggered in the acending thoracic aorta at 120 HU's  full mA was used for 40-80% of the R-R cycle average HR during the scan was 50 bpm. The 3D data set was interpreted on a dedicated work station using MPR, MIP and VRT modes. A total of 80cc of contrast was used. FINDINGS: Non-cardiac: See separate report from [facility]. No significant findings on limited lung and soft tissue windows. Calcium Score:  0 Coronary Arteries: Right dominant with no anomalies LM:  Normal LAD:  Normal D1: Normal D2: Normal Circumflex: Normal OM1:  Normal AV groove normal RCA:  Normal PDA:  Normal PLA:  Normal IMPRESSION: Normal right dominant coronary arteries Calcium Score of 0 Normal aortic root 2.8 cm No proximal pulmonary emboli [place] Electronically Signed   By: Jenkins Rouge M.D.   On: 06/07/2016 11:55   Result Date: 06/07/2016 EXAM: OVER-READ INTERPRETATION  CT CHEST The following report is an over-read performed by radiologist Dr. Collene Leyden [facility], [place] on 06/07/2016. This over-read does not include interpretation of cardiac or coronary anatomy or pathology. The coronary CTA interpretation by the cardiologist is attached. COMPARISON:  None. FINDINGS: Cardiovascular: Heart is borderline in size. Visualized aorta is normal caliber. Mediastinum/Nodes: No adenopathy in the lower mediastinum or hila. Lungs/Pleura: Visualized lungs clear. Upper Abdomen: Imaging into the upper abdomen shows no acute findings. Musculoskeletal: No chest wall mass or suspicious bone lesions identified. IMPRESSION: No acute or significant extracardiac abnormality. Electronically Signed: By: Rolm Baptise M.D. On: 06/07/2016 11:45       Subjective: No chest pain, wants to go home . No sob.   Discharge Exam: Vitals:   06/07/16 1005 06/07/16 1438  BP: (!) 143/94 131/82  Pulse:  (!) 49  Resp:  18  Temp:  97.6 F (36.4 C)   Vitals:   06/07/16 0505 06/07/16 0752 06/07/16 1005 06/07/16 1438  BP:  124/67 (!) 143/94 131/82  Pulse: (!) 50   (!) 49  Resp:    18  Temp: 97.7 F (36.5 C)   97.6 F (36.4 C)  TempSrc: Oral   Oral  SpO2: 93% 98%  98%  Weight: 82.1 kg (181 lb)     Height:        General: Pt is alert, awake, not in acute distress Cardiovascular: RRR, S1/S2 +, no rubs, no gallops Respiratory: CTA bilaterally, no wheezing, no rhonchi Abdominal: Soft, NT, ND, bowel sounds + Extremities: no edema, no cyanosis    The results of significant diagnostics from this hospitalization (including imaging, microbiology, ancillary and laboratory) are listed below for reference.      Microbiology: No results found for this or any previous visit (from the past 240 hour(s)).   Labs: BNP (last 3 results) No results for input(s): BNP in the last 8760 hours. Basic Metabolic Panel:  Recent Labs Lab 06/06/16 1606 06/07/16 0440  NA 143 141  K 4.2 3.8  CL 106 104  CO2 30 31  GLUCOSE 97 106*  BUN 16 15  CREATININE 1.25* 1.19  CALCIUM 9.3 9.0   Liver Function Tests:  Recent Labs Lab 06/06/16 1606  AST 16  ALT 18  ALKPHOS 79  BILITOT 0.8  PROT 7.4  ALBUMIN 4.1   No results for input(s): LIPASE, AMYLASE in the last 168 hours. No results for input(s): AMMONIA in the last 168 hours. CBC:  Recent Labs Lab 06/06/16 1606 06/07/16 0440  WBC 5.3 5.5  HGB 15.2 14.4  HCT 43.6 42.3  MCV 87.6 87.4  PLT 203 192   Cardiac Enzymes:  Recent  Labs Lab 06/06/16 1606 06/06/16 2247 06/07/16 0440 06/07/16 1157  TROPONINI <0.03 <0.03 <0.03 <0.03   BNP: Invalid input(s): POCBNP CBG: No results for input(s): GLUCAP in the last 168 hours. D-Dimer No results for input(s): DDIMER in the last 72 hours. Hgb A1c No results for input(s): HGBA1C in the last 72 hours. Lipid Profile  Recent Labs  06/07/16 0440  CHOL 176  HDL 59  LDLCALC 97  TRIG 99  CHOLHDL 3.0   Thyroid function studies  Recent Labs  06/06/16 2247  TSH 0.741   Anemia work up No results for input(s): VITAMINB12, FOLATE, FERRITIN, TIBC, IRON, RETICCTPCT in the last 72 hours. Urinalysis No results found for: COLORURINE, APPEARANCEUR, LABSPEC, [place], GLUCOSEU, HGBUR, BILIRUBINUR, KETONESUR, PROTEINUR, UROBILINOGEN, NITRITE, LEUKOCYTESUR Sepsis Labs Invalid input(s): PROCALCITONIN,  WBC,  LACTICIDVEN Microbiology No results found for this or any previous visit (from the past 240 hour(s)).   Time coordinating discharge: Over 30 minutes  SIGNED:   Hosie Poisson, MD  [facility] 06/09/2016, 10:18 PM Pager   If 7PM-7AM, please contact  night-coverage [email or web address] Password [email or web address]

## 2016-06-11 ENCOUNTER — Ambulatory Visit (INDEPENDENT_AMBULATORY_CARE_PROVIDER_SITE_OTHER): Payer: PRIVATE HEALTH INSURANCE | Admitting: Family Medicine

## 2016-06-11 ENCOUNTER — Encounter: Payer: Self-pay | Admitting: Family Medicine

## 2016-06-11 ENCOUNTER — Other Ambulatory Visit: Payer: Self-pay | Admitting: Family Medicine

## 2016-06-11 VITALS — BP 132/76 | Ht 71.5 in | Wt 185.0 lb

## 2016-06-11 DIAGNOSIS — I10 Essential (primary) hypertension: Secondary | ICD-10-CM

## 2016-06-11 DIAGNOSIS — Z125 Encounter for screening for malignant neoplasm of prostate: Secondary | ICD-10-CM

## 2016-06-11 DIAGNOSIS — R079 Chest pain, unspecified: Secondary | ICD-10-CM

## 2016-06-11 MED ORDER — MELOXICAM 15 MG PO TABS: 15.0000 mg | ORAL_TABLET | Freq: Every day | ORAL | 0 refills | Status: DC

## 2016-06-11 NOTE — Progress Notes (Signed)
   Subjective:    Patient ID: Christopher [name], male    DOB: February 12, 1960, 56 y.o.   MRN: [id number]  Chest Pain   This is a new problem. The current episode started in the past 7 days.   Patient in today for a hospitalization follow up for chest pain.  Patient relates chest pain on the right side of his chest radiated into the upper portion of his chest as well as into the left side the chest patient had a Angie oh of his coronary arteries which was judged to be normal. He was told by cardiology they did not feel this was his heart. He states his chest doing better still has some pain with movement. Denies soreness to the touch. States no other concerns this visit.     Review of Systems  Cardiovascular: Positive for chest pain.  Denies nausea vomiting reflux and eyes abdominal pain and leg pain     Objective:   Physical Exam Lungs clear hearts regular chest wall nontender neck no masses HEENT benign       Assessment & Plan:  I suspect musculoskeletal chest pain anti-inflammatory recommended if not improving over the next 7 days to notify us D-dimer ordered although I think the likelihood of a ulnar embolus is low Metabolic 7 lipid profile PSA Follow-up office visit in a couple weeks' time

## 2016-06-14 ENCOUNTER — Encounter: Payer: Self-pay | Admitting: Family Medicine

## 2016-06-14 LAB — BASIC METABOLIC PANEL
BUN/Creatinine Ratio: 14 (ref 9–20)
BUN: 15 mg/dL (ref 6–24)
CALCIUM: 9.3 mg/dL (ref 8.7–10.2)
CO2: 28 mmol/L (ref 18–29)
CREATININE: 1.09 mg/dL (ref 0.76–1.27)
Chloride: 104 mmol/L (ref 96–106)
GFR calc Af Amer: 88 mL/min/{1.73_m2} (ref >59)
GFR, EST NON AFRICAN AMERICAN: 77 mL/min/{1.73_m2} (ref >59)
Glucose: 106 mg/dL — ABNORMAL HIGH (ref 65–99)
Potassium: 4.5 mmol/L (ref 3.5–5.2)
Sodium: 143 mmol/L (ref 134–144)

## 2016-06-14 LAB — D-DIMER, QUANTITATIVE: D-DIMER: <0.2 mg/L FEU (ref 0.00–0.49)

## 2016-06-14 LAB — LIPID PANEL
CHOL/HDL RATIO: 2.8 ratio (ref 0.0–5.0)
CHOLESTEROL TOTAL: 188 mg/dL (ref 100–199)
HDL: 66 mg/dL (ref >39)
LDL CALC: 100 mg/dL — AB (ref 0–99)
TRIGLYCERIDES: 109 mg/dL (ref 0–149)
VLDL Cholesterol Cal: 22 mg/dL (ref 5–40)

## 2016-06-14 LAB — PSA: Prostate Specific Ag, Serum: 1 ng/mL (ref 0.0–4.0)

## 2016-06-25 ENCOUNTER — Ambulatory Visit: Payer: PRIVATE HEALTH INSURANCE | Admitting: Family Medicine

## 2016-07-08 ENCOUNTER — Telehealth: Payer: Self-pay | Admitting: Family Medicine

## 2016-07-08 NOTE — Telephone Encounter (Signed)
Was supposed to see pain specialis t late April and no mention dr scotts last note, re c v f u with dr Nicki Reaper next wk, let pt know with new narcotic laws have to clarify all and requires o v

## 2016-07-08 NOTE — Telephone Encounter (Signed)
Patient notified was supposed to see pain specialist late April and no mention dr scotts last note, rec office visit for f/ u with dr scott next wk, let pt know with new narcotic laws have to clarify all and requires office visit. Patient verbalized understanding and scheduled follow up office visit.

## 2016-07-08 NOTE — Telephone Encounter (Signed)
Requesting refill on oxycodone for his knee pain.

## 2016-07-08 NOTE — Telephone Encounter (Signed)
Last office note 03/2016 states patient referred to ortho for knee pain

## 2016-07-12 ENCOUNTER — Ambulatory Visit: Payer: PRIVATE HEALTH INSURANCE | Admitting: Family Medicine

## 2016-09-23 ENCOUNTER — Encounter: Payer: Self-pay | Admitting: Family Medicine

## 2016-09-23 ENCOUNTER — Ambulatory Visit (INDEPENDENT_AMBULATORY_CARE_PROVIDER_SITE_OTHER): Payer: BLUE CROSS/BLUE SHIELD | Admitting: Family Medicine

## 2016-09-23 VITALS — BP 170/110 | Ht 71.5 in | Wt 187.5 lb

## 2016-09-23 DIAGNOSIS — M25561 Pain in right knee: Secondary | ICD-10-CM

## 2016-09-23 DIAGNOSIS — I10 Essential (primary) hypertension: Secondary | ICD-10-CM

## 2016-09-23 MED ORDER — OXYCODONE-ACETAMINOPHEN 5-325 MG PO TABS: 1.0000 | ORAL_TABLET | ORAL | 0 refills | Status: DC | PRN

## 2016-09-23 MED ORDER — DICLOFENAC SODIUM 75 MG PO TBEC: 75.0000 mg | DELAYED_RELEASE_TABLET | Freq: Two times a day (BID) | ORAL | 3 refills | Status: DC

## 2016-09-23 MED ORDER — AMLODIPINE BESYLATE 5 MG PO TABS: 5.0000 mg | ORAL_TABLET | Freq: Every day | ORAL | 5 refills | Status: DC

## 2016-09-23 NOTE — Patient Instructions (Signed)
DASH Eating Plan DASH stands for "Dietary Approaches to Stop Hypertension." The DASH eating plan is a healthy eating plan that has been shown to reduce high blood pressure (hypertension). It may also reduce your risk for type 2 diabetes, heart disease, and stroke. The DASH eating plan may also help with weight loss. What are tips for following this plan? General guidelines  Avoid eating more than 2,300 mg (milligrams) of salt (sodium) a day. If you have hypertension, you may need to reduce your sodium intake to 1,500 mg a day.  Limit alcohol intake to no more than 1 drink a day for nonpregnant women and 2 drinks a day for men. One drink equals 12 oz of beer, 5 oz of wine, or 1 oz of hard liquor.  Work with your health care provider to maintain a healthy body weight or to lose weight. Ask what an ideal weight is for you.  Get at least 30 minutes of exercise that causes your heart to beat faster (aerobic exercise) most days of the week. Activities may include walking, swimming, or biking.  Work with your health care provider or diet and nutrition specialist (dietitian) to adjust your eating plan to your individual calorie needs. Reading food labels  Check food labels for the amount of sodium per serving. Choose foods with less than 5 percent of the Daily Value of sodium. Generally, foods with less than 300 mg of sodium per serving fit into this eating plan.  To find whole grains, look for the word "whole" as the first word in the ingredient list. Shopping  Buy products labeled as "low-sodium" or "no salt added."  Buy fresh foods. Avoid canned foods and premade or frozen meals. Cooking  Avoid adding salt when cooking. Use salt-free seasonings or herbs instead of table salt or sea salt. Check with your health care provider or pharmacist before using salt substitutes.  Do not fry foods. Cook foods using healthy methods such as baking, boiling, grilling, and broiling instead.  Cook with  heart-healthy oils, such as olive, canola, soybean, or sunflower oil. Meal planning   Eat a balanced diet that includes: ? 5 or more servings of fruits and vegetables each day. At each meal, try to fill half of your plate with fruits and vegetables. ? Up to 6-8 servings of whole grains each day. ? Less than 6 oz of lean meat, poultry, or fish each day. A 3-oz serving of meat is about the same size as a deck of cards. One egg equals 1 oz. ? 2 servings of low-fat dairy each day. ? A serving of nuts, seeds, or beans 5 times each week. ? Heart-healthy fats. Healthy fats called Omega-3 fatty acids are found in foods such as flaxseeds and coldwater fish, like sardines, salmon, and mackerel.  Limit how much you eat of the following: ? Canned or prepackaged foods. ? Food that is high in trans fat, such as fried foods. ? Food that is high in saturated fat, such as fatty meat. ? Sweets, desserts, sugary drinks, and other foods with added sugar. ? Full-fat dairy products.  Do not salt foods before eating.  Try to eat at least 2 vegetarian meals each week.  Eat more home-cooked food and less restaurant, buffet, and fast food.  When eating at a restaurant, ask that your food be prepared with less salt or no salt, if possible. What foods are recommended? The items listed may not be a complete list. Talk with your dietitian about what   dietary choices are best for you. Grains Whole-grain or whole-wheat bread. Whole-grain or whole-wheat pasta. Brown rice. Oatmeal. Quinoa. Bulgur. Whole-grain and low-sodium cereals. Pita bread. Low-fat, low-sodium crackers. Whole-wheat flour tortillas. Vegetables Fresh or frozen vegetables (raw, steamed, roasted, or grilled). Low-sodium or reduced-sodium tomato and vegetable juice. Low-sodium or reduced-sodium tomato sauce and tomato paste. Low-sodium or reduced-sodium canned vegetables. Fruits All fresh, dried, or frozen fruit. Canned fruit in natural juice (without  added sugar). Meat and other protein foods Skinless chicken or turkey. Ground chicken or turkey. Pork with fat trimmed off. Fish and seafood. Egg whites. Dried beans, peas, or lentils. Unsalted nuts, nut butters, and seeds. Unsalted canned beans. Lean cuts of beef with fat trimmed off. Low-sodium, lean deli meat. Dairy Low-fat (1%) or fat-free (skim) milk. Fat-free, low-fat, or reduced-fat cheeses. Nonfat, low-sodium ricotta or cottage cheese. Low-fat or nonfat yogurt. Low-fat, low-sodium cheese. Fats and oils Soft margarine without trans fats. Vegetable oil. Low-fat, reduced-fat, or light mayonnaise and salad dressings (reduced-sodium). Canola, safflower, olive, soybean, and sunflower oils. Avocado. Seasoning and other foods Herbs. Spices. Seasoning mixes without salt. Unsalted popcorn and pretzels. Fat-free sweets. What foods are not recommended? The items listed may not be a complete list. Talk with your dietitian about what dietary choices are best for you. Grains Baked goods made with fat, such as croissants, muffins, or some breads. Dry pasta or rice meal packs. Vegetables Creamed or fried vegetables. Vegetables in a cheese sauce. Regular canned vegetables (not low-sodium or reduced-sodium). Regular canned tomato sauce and paste (not low-sodium or reduced-sodium). Regular tomato and vegetable juice (not low-sodium or reduced-sodium). Pickles. Olives. Fruits Canned fruit in a light or heavy syrup. Fried fruit. Fruit in cream or butter sauce. Meat and other protein foods Fatty cuts of meat. Ribs. Fried meat. Bacon. Sausage. Bologna and other processed lunch meats. Salami. Fatback. Hotdogs. Bratwurst. Salted nuts and seeds. Canned beans with added salt. Canned or smoked fish. Whole eggs or egg yolks. Chicken or turkey with skin. Dairy Whole or 2% milk, cream, and half-and-half. Whole or full-fat cream cheese. Whole-fat or sweetened yogurt. Full-fat cheese. Nondairy creamers. Whipped toppings.  Processed cheese and cheese spreads. Fats and oils Butter. Stick margarine. Lard. Shortening. Ghee. Bacon fat. Tropical oils, such as coconut, palm kernel, or palm oil. Seasoning and other foods Salted popcorn and pretzels. Onion salt, garlic salt, seasoned salt, table salt, and sea salt. Worcestershire sauce. Tartar sauce. Barbecue sauce. Teriyaki sauce. Soy sauce, including reduced-sodium. Steak sauce. Canned and packaged gravies. Fish sauce. Oyster sauce. Cocktail sauce. Horseradish that you find on the shelf. Ketchup. Mustard. Meat flavorings and tenderizers. Bouillon cubes. Hot sauce and Tabasco sauce. Premade or packaged marinades. Premade or packaged taco seasonings. Relishes. Regular salad dressings. Where to find more information:  National Heart, Lung, and Blood Institute: www.nhlbi.nih.gov  American Heart Association: www.heart.org Summary  The DASH eating plan is a healthy eating plan that has been shown to reduce high blood pressure (hypertension). It may also reduce your risk for type 2 diabetes, heart disease, and stroke.  With the DASH eating plan, you should limit salt (sodium) intake to 2,300 mg a day. If you have hypertension, you may need to reduce your sodium intake to 1,500 mg a day.  When on the DASH eating plan, aim to eat more fresh fruits and vegetables, whole grains, lean proteins, low-fat dairy, and heart-healthy fats.  Work with your health care provider or diet and nutrition specialist (dietitian) to adjust your eating plan to your individual   calorie needs. This information is not intended to replace advice given to you by your health care provider. Make sure you discuss any questions you have with your health care provider. Document Released: 01/07/2011 Document Revised: 01/12/2016 Document Reviewed: 01/12/2016 Elsevier Interactive Patient Education  2017 Elsevier Inc.  

## 2016-09-23 NOTE — Progress Notes (Signed)
   Subjective:    Patient ID: Christopher [name], male    DOB: 1960/06/24, 56 y.o.   MRN: [id number]  Knee Pain    Patient came in today with right knee pain. Says he has been seen for this in the past. He did not injure it as he recalls. Takes Advil which does not help. This patient did try prescription anti-inflammatory did not help enough He did see Dr. Theda Sers orthopedic specialist in [place] who recommended knee replacement Patient is thinking about in this done later this year he relates the pain interferes with sleeping at night he would like to have pain medicine for evening use he denies wondering to take pain medicine during the day denies a history of abusing pain medicine.  Also patient has high blood pressure tries watch his diet states he takes his medicine  Review of Systems  Constitutional: Negative for activity change, fatigue and fever.  Respiratory: Negative for cough and shortness of breath.   Cardiovascular: Negative for chest pain and leg swelling.  Musculoskeletal: Positive for arthralgias.  Neurological: Negative for headaches.       Objective:   Physical Exam  Constitutional: He appears well-nourished. No distress.  Cardiovascular: Normal rate, regular rhythm and normal heart sounds.   No murmur heard. Pulmonary/Chest: Effort normal and breath sounds normal. No respiratory distress.  Musculoskeletal: He exhibits no edema.  Lymphadenopathy:    He has no cervical adenopathy.  Neurological: [name] alert.  Psychiatric: His behavior is normal.  Vitals reviewed.  I did discuss with the patient today the potential addictive nature of pain medicine patient agrees that he would like to try the medicine help him he cannot tolerate hydrocodone but he can tolerate Percocet       Assessment & Plan:  Knee pain-we will get the latest note from Dr. Theda Sers for review Prescription anti-inflammatory twice a day to help with this If ongoing troubles follow-up  Pain  medication was prescribed for the patient use in the evening hours to help him with his pain. I do not feel the patient abusing medicine. Drug registry checked. Patient will follow-up in 4 weeks' time for recheck if we decide at that point then he needs to be on chronic pain medications then pain contract will be executed  Blood pressure prescription given today amlodipine 5 mg daily recheck blood pressure in 3-4 weeks

## 2016-09-27 ENCOUNTER — Other Ambulatory Visit: Payer: Self-pay | Admitting: Family Medicine

## 2016-10-13 ENCOUNTER — Encounter: Payer: Self-pay | Admitting: Family Medicine

## 2016-10-13 ENCOUNTER — Ambulatory Visit (INDEPENDENT_AMBULATORY_CARE_PROVIDER_SITE_OTHER): Payer: BLUE CROSS/BLUE SHIELD | Admitting: Family Medicine

## 2016-10-13 VITALS — BP 120/68 | Ht 71.5 in | Wt 186.0 lb

## 2016-10-13 DIAGNOSIS — M170 Bilateral primary osteoarthritis of knee: Secondary | ICD-10-CM | POA: Insufficient documentation

## 2016-10-13 MED ORDER — OXYCODONE-ACETAMINOPHEN 10-325 MG PO TABS: 1.0000 | ORAL_TABLET | Freq: Four times a day (QID) | ORAL | 0 refills | Status: DC | PRN

## 2016-10-13 MED ORDER — PREDNISONE 20 MG PO TABS: ORAL_TABLET | ORAL | 0 refills | Status: DC

## 2016-10-13 NOTE — Progress Notes (Signed)
   Subjective:    Patient ID: Christopher [name], male    DOB: 07-02-1960, 56 y.o.   MRN: [id number]  HPIBilateral knee pain. Started 5 -6 months ago. Taking oxycodone. No relief.  Bilateral knee pain worse on the right than the left at the end of the day they tend to swell up worse on the right and left does not lock does not give away has seen orthopedics in [place] before told him he may have to have knee replacement surgery denies any chest tightness pressure pain shortness of breath PMH benign patient states plain oxycodone does not help him he has not seen orthopedics in several years   Review of Systems  Constitutional: Negative for activity change, fatigue and fever.  Respiratory: Negative for cough and shortness of breath.   Cardiovascular: Negative for chest pain and leg swelling.  Neurological: Negative for headaches.       Objective:   Physical Exam  Constitutional: He appears well-nourished. No distress.  Cardiovascular: Normal rate, regular rhythm and normal heart sounds.   No murmur heard. Pulmonary/Chest: Effort normal and breath sounds normal. No respiratory distress.  Musculoskeletal: He exhibits no edema.  Lymphadenopathy:    He has no cervical adenopathy.  Neurological: [name] alert.  Psychiatric: His behavior is normal.  Vitals reviewed.  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 Aline Brochure for possible injections possible surgery

## 2016-10-13 NOTE — Patient Instructions (Signed)
Use prednisone for 6 days  Hold off diclofenac while on prednisone  Use oxycodone while at home only sparingly, caution drowsiness

## 2016-10-20 ENCOUNTER — Ambulatory Visit (INDEPENDENT_AMBULATORY_CARE_PROVIDER_SITE_OTHER): Payer: BLUE CROSS/BLUE SHIELD

## 2016-10-20 ENCOUNTER — Encounter: Payer: Self-pay | Admitting: Orthopedic Surgery

## 2016-10-20 ENCOUNTER — Ambulatory Visit: Payer: BLUE CROSS/BLUE SHIELD

## 2016-10-20 ENCOUNTER — Ambulatory Visit (INDEPENDENT_AMBULATORY_CARE_PROVIDER_SITE_OTHER): Payer: BLUE CROSS/BLUE SHIELD | Admitting: Orthopedic Surgery

## 2016-10-20 VITALS — BP 141/94 | HR 89 | Resp 16 | Ht 70.5 in | Wt 183.0 lb

## 2016-10-20 DIAGNOSIS — M25562 Pain in left knee: Secondary | ICD-10-CM

## 2016-10-20 DIAGNOSIS — G8929 Other chronic pain: Secondary | ICD-10-CM

## 2016-10-20 DIAGNOSIS — M25561 Pain in right knee: Secondary | ICD-10-CM

## 2016-10-20 DIAGNOSIS — M170 Bilateral primary osteoarthritis of knee: Secondary | ICD-10-CM

## 2016-10-20 MED ORDER — METHYLPREDNISOLONE ACETATE 40 MG/ML IJ SUSP
40.0000 mg | Freq: Once | INTRAMUSCULAR | Status: AC
  Administered 2016-10-20: 40 mg via INTRA_ARTICULAR

## 2016-10-20 NOTE — Patient Instructions (Addendum)

## 2016-10-20 NOTE — Progress Notes (Signed)
Patient ID: Christopher [name], male   DOB: 25-Dec-1960, 56 y.o.   MRN: [id number]  Chief Complaint  Patient presents with  . Knee Pain    right greater than left knee pain     This is a 56 year old male presents for evaluation of his right knee.  He status post right knee surgery about 15 years ago not sure what he had  He complains of severe right knee pain over the medial joint line with progressive deformity aching constant pain decreased range of motion motion and swelling  Previous treatment includes diclofenac and oxycodone. He says the oxycodone that really helped his pain  He thinks he might of had an injection in the past not sure   he doesn't notice any weakness or giving way does have some difficulties with certain activities daily living    Review of Systems  Constitutional: Negative for fever.  Respiratory: Negative for shortness of breath.   Cardiovascular: Negative for chest pain.  Neurological: Negative for tingling.    Past Medical History:  Diagnosis Date  . HTN (hypertension)   . Hyperlipemia     Past Surgical History:  Procedure Laterality Date  . HERNIA REPAIR    . KNEE ARTHROSCOPY  right knee   Social History  Substance Use Topics  . Smoking status: Never Smoker  . Smokeless tobacco: Never Used  . Alcohol use Yes     Comment: occasional beer     PHYSICAL EXAM  BP (!) 141/94   Pulse 89   Resp 16   Ht 5' 10.5" (1.791 m)   Wt 183 lb (83 kg)   BMI 25.89 kg/m  GENERAL appearance reveals no gross abnormalities, normal development grooming and hygiene   MENTAL STATUS we note that the patient is awake alert and oriented to person place and time MOOD/AFFECT ARE NORMAL   GAIT reveals A varus thrust right leg limp in the effected limb  Left Knee Exam  Swelling: None Effusion: No  Range of Motion  Extension: 0 Flexion:     120  Muscle Strength  Normal left knee strength  Tests  McMurrays:  Medial - Negative      Lateral -  Negative Drawer:       Anterior - Negative       Right Knee Exam  Swelling: Moderate Effusion: Yes  Range of Motion  Extension: 10 Flexion:     90  Muscle Strength  Normal right knee strength  Tests  Drawer:       Anterior - Negative    Posterior - Negative Varus:  Negative Valgus: Negative  Comments:  Normal sensation right leg  Normal pulse no peripheral edema      IMAGING STUDIES  I have independently reviewed the x-rays and I interpreted the x-rays as follows:  Right and left knee x-rays were ordered and I see that he has severe arthritis and varus deformity right knee severe arthritis and less varus deformity left knee  Dx   primary osteoarthritis of the  both knees  PLAN  he has opted for injection right knee at this time  Continue oral anti-inflammatory  Recommend stopping oxycodone  3:18 PM [name], MD 10/20/2016

## 2016-10-21 ENCOUNTER — Ambulatory Visit: Payer: BLUE CROSS/BLUE SHIELD | Admitting: Family Medicine

## 2016-11-22 ENCOUNTER — Telehealth: Payer: Self-pay | Admitting: Family Medicine

## 2016-11-22 NOTE — Telephone Encounter (Signed)
Please verify with patient. Is this patient seeking a letter of disability from his job? Or a letter of disability from all work (as in [place] disability )? (It is possible we may need to bring this patient in for an office visit in the near future further information needed )

## 2016-11-22 NOTE — Telephone Encounter (Signed)
Requesting letter wrote by Dr. Nicki Reaper on his behalf for disability due to his knees.

## 2016-11-23 NOTE — Telephone Encounter (Signed)
Patient wants a letter for social security disability- states he cant work anymore because his knees bother him

## 2016-11-30 ENCOUNTER — Encounter: Payer: Self-pay | Admitting: Family Medicine

## 2016-11-30 NOTE — Telephone Encounter (Signed)
Please let the patient know that I did do a letter for disability with social security but disability is a legal determination that the government makes a subpar the patient's medical issues, their age, their education background, and their work. It will be necessary for the patient to discuss disability further with his orthopedist. It is also necessary for the patient to understand that he may or may not be granted disability from a government point of view based on all of these factors stated above. If the patient would like to do a office visit regarding his knees anywhere in the next several weeks that would be fine at the next regular office visit appointment

## 2016-12-03 ENCOUNTER — Telehealth: Payer: Self-pay | Admitting: Family Medicine

## 2016-12-03 ENCOUNTER — Other Ambulatory Visit: Payer: Self-pay | Admitting: Nurse Practitioner

## 2016-12-03 MED ORDER — OXYCODONE-ACETAMINOPHEN 10-325 MG PO TABS: 1.0000 | ORAL_TABLET | Freq: Four times a day (QID) | ORAL | 0 refills | Status: DC | PRN

## 2016-12-03 NOTE — Telephone Encounter (Signed)
Pt is requesting a refill on  oxyCODONE-acetaminophen (PERCOCET) 10-325 MG tablet

## 2016-12-03 NOTE — Telephone Encounter (Signed)
Refill printed 

## 2016-12-03 NOTE — Telephone Encounter (Signed)
Pt.notified

## 2016-12-03 NOTE — Telephone Encounter (Signed)
N[facility] reviewed. 

## 2016-12-15 ENCOUNTER — Ambulatory Visit (INDEPENDENT_AMBULATORY_CARE_PROVIDER_SITE_OTHER): Payer: BLUE CROSS/BLUE SHIELD | Admitting: Orthopedic Surgery

## 2016-12-15 ENCOUNTER — Encounter: Payer: Self-pay | Admitting: Orthopedic Surgery

## 2016-12-15 VITALS — BP 162/92 | HR 85 | Ht 71.0 in | Wt 188.0 lb

## 2016-12-15 DIAGNOSIS — M170 Bilateral primary osteoarthritis of knee: Secondary | ICD-10-CM | POA: Diagnosis not present

## 2016-12-15 DIAGNOSIS — M1711 Unilateral primary osteoarthritis, right knee: Secondary | ICD-10-CM

## 2016-12-15 NOTE — Progress Notes (Signed)
Progress Note//preop   Patient ID: Christopher [name], male   DOB: 1960-09-08, 56 y.o.   MRN: [id number]  Chief Complaint  Patient presents with  . Knee Pain    right knee pain getting worse    HPI  56 year old male previously seen for right knee pain diagnosis with osteoarthritis  He comes in complaining of increasing pain (dull constant aching) medial joint line with decreasing range of motion and decreasing function requiring use of a cane   Review of Systems  Musculoskeletal: Positive for joint pain. Negative for falls.  Neurological: Negative for tingling, sensory change and focal weakness.  All other systems reviewed and are negative.  Current Meds  Medication Sig  . amLODipine (NORVASC) 5 MG tablet Take 1 tablet (5 mg total) by mouth daily.  [name] aspirin EC 81 MG tablet Take 81 mg by mouth daily.  [name] atorvastatin (LIPITOR) 10 MG tablet TAKE 1 TABLET BY MOUTH DAILY  . diclofenac (VOLTAREN) 75 MG EC tablet Take 1 tablet (75 mg total) by mouth 2 (two) times daily.  . metoprolol tartrate (LOPRESSOR) 25 MG tablet Take 0.5 tablets (12.5 mg total) by mouth 2 (two) times daily.    Past Medical History:  Diagnosis Date  . HTN (hypertension)   . Hyperlipemia      Allergies  Allergen Reactions  . Hydrocodone Itching    BP (!) 162/92   Pulse 85   Ht 5\' 11"  (1.803 m)   Wt 188 lb (85.3 kg)   BMI 26.22 kg/m    Physical Exam  Constitutional: [name] oriented to person, place, and time. He appears well-developed and well-nourished.  Neurological: [name] alert and oriented to person, place, and time.  Psychiatric: He has a normal mood and affect. His behavior is normal. Judgment and thought content normal.    Ortho Exam  Ambulatory status he is has a severe varus thrust he has a limp favoring the right leg  He has a varus knee flexion contracture 5 degrees knee flexion 90 degrees ligaments are stable extension strength is normal muscle tone is excellent skin is intact no rash  lesions ulceration or subcutaneous nodules are palpated neurovascular exam shows normal sensation in the right leg normal reflexes normal coordination of the right lower extremity normal pulse and perfusion and capillary refill   Medical decision-making  Imaging: Right knee shows severe varus and severe collapse medial compartment, patellofemoral area shows slight subluxation of the median ridge   Encounter Diagnoses  Name Primary?  . Primary osteoarthritis of both knees   . Primary osteoarthritis of right knee Yes     The procedure has been fully reviewed with the patient; The risks and benefits of surgery have been discussed and explained and understood. Alternative treatment has also been reviewed, questions were encouraged and answered. The postoperative plan is also been reviewed.  Right total knee January 15   [name], MD 12/15/2016 4:21 PM

## 2016-12-15 NOTE — Patient Instructions (Signed)
You have decided to proceed with knee replacement surgery. You have decided not to continue with nonoperative measures such as but not limited to oral medication, weight loss, activity modification, physical therapy, bracing, or injection.  We will perform the procedure commonly known as total knee replacement. Some of the risks associated with knee replacement surgery include but are not limited to Bleeding Infection Swelling Stiffness Blood clot Pain that persists even after surgery  Infection is especially devastating complication of knee surgery although rare. If infection does occur your implant will usually have to be removed and several surgeries and antibiotics will be needed to eradicate the infection prior to performing a repeat replacement.   In some cases amputation is required to eradicate the infection. In other rare cases a knee fusion is needed    If you're not comfortable with these risks and would like to continue with nonoperative treatment please let Dr. Harrison know prior to your surgery.  

## 2016-12-16 ENCOUNTER — Telehealth: Payer: Self-pay | Admitting: Radiology

## 2016-12-16 NOTE — Telephone Encounter (Signed)
I have called patient to advise.  

## 2016-12-16 NOTE — Telephone Encounter (Signed)
-----   Message from Nils Flack sent at 12/16/2016  3:46 PM EST ----- Regarding: preop and surgery date and time Hi Demario Faniel,  The above patient's preop is scheduled for 02/09/17 @ 10:00am and surgery on 02/15/17 @ 7:30am.  Thank you,  Lorre Nick.

## 2016-12-16 NOTE — Addendum Note (Signed)
Addended by: [name] E on: 12/16/2016 02:41 PM   Modules accepted: Orders, SmartSet

## 2016-12-21 DIAGNOSIS — Z029 Encounter for administrative examinations, unspecified: Secondary | ICD-10-CM

## 2017-01-04 ENCOUNTER — Telehealth: Payer: Self-pay | Admitting: Family Medicine

## 2017-01-04 NOTE — Telephone Encounter (Signed)
Patient requesting refill on oxycodone 10/325 for knee pain.

## 2017-01-05 NOTE — Telephone Encounter (Signed)
May have a prescription for 20 tablets 1 taken 4 times daily as needed pain cautioned drowsiness I recommend that the patient has a follow-up office visit to discuss pain management this needs to be within the next 45 days

## 2017-01-06 MED ORDER — OXYCODONE-ACETAMINOPHEN 10-325 MG PO TABS: 1.0000 | ORAL_TABLET | Freq: Four times a day (QID) | ORAL | 0 refills | Status: DC | PRN

## 2017-01-06 NOTE — Telephone Encounter (Signed)
Prescription sent electronically to pharmacy. Patient notified and advised he will need an office visit for further refills. Patient verbalized understanding.

## 2017-02-03 ENCOUNTER — Telehealth: Payer: Self-pay | Admitting: Radiology

## 2017-02-03 DIAGNOSIS — M1711 Unilateral primary osteoarthritis, right knee: Secondary | ICD-10-CM

## 2017-02-03 NOTE — Patient Instructions (Signed)
Christopher [name]  02/03/2017     @PREFPERIOPPHARMACY @   Your procedure is scheduled on  02/15/2017 .  Report to [facility] at  615  A.M.  Call this number if you have problems the morning of surgery:  [phone]   Remember:  Do not eat food or drink liquids after midnight.  Take these medicines the morning of surgery with A SIP OF WATER  Amlodipine.   Do not wear jewelry, make-up or nail polish.  Do not wear lotions, powders, or perfumes, or deodorant.  Do not shave 48 hours prior to surgery.  Men may shave face and neck.  Do not bring valuables to the hospital.  [facility] is not responsible for any belongings or valuables.  Contacts, dentures or bridgework may not be worn into surgery.  Leave your suitcase in the car.  After surgery it may be brought to your room.  For patients admitted to the hospital, discharge time will be determined by your treatment team.  Patients discharged the day of surgery will not be allowed to drive home.   Name and phone number of your driver:   family Special instructions:  None  Please read over the following fact sheets that you were given. Pain Booklet, Coughing and Deep Breathing, Blood Transfusion Information, Lab Information, Total Joint Packet, MRSA Information, Surgical Site Infection Prevention, Anesthesia Post-op Instructions and Care and Recovery After Surgery       Total Knee Replacement Total knee replacement is a procedure to replace the knee joint with an artificial (prosthetic) knee joint. The purpose of this surgery is to reduce knee pain and improve knee function. The prosthetic knee joint (prosthesis) may be made of metal, plastic or ceramic. It replaces parts of the thigh bone (femur), lower leg bone (tibia), and kneecap (patella) that are removed during the procedure. Tell a health care provider about:  Any allergies you have.  All medicines you are taking, including vitamins, herbs, eye drops,  creams, and over-the-counter medicines.  Any problems you or family members have had with anesthetic medicines.  Any blood disorders you have.  Any surgeries you have had.  Any medical conditions you have.  Whether you are pregnant or may be pregnant. What are the risks? Generally, this is a safe procedure. However, problems may occur, including:  Infection.  Bleeding.  Allergic reactions to medicines.  Damage to other structures or organs.  Decreased range of motion of the knee.  Instability of the knee.  Loosening of the prosthetic joint.  Knee pain that does not go away (chronic pain).  What happens before the procedure?  Ask your health care provider about: ? Changing or stopping your regular medicines. This is especially important if you are taking diabetes medicines or blood thinners. ? Taking medicines such as aspirin and ibuprofen. These medicines can thin your blood. Do not take these medicines before your procedure if your health care provider instructs you not to.  Have dental care and routine cleanings completed before your procedure. Plan to not have dental work done for 3 months after your procedure. Germs from anywhere in your body, including your mouth, can travel to your new joint and infect it.  Follow instructions from your health care provider about eating or drinking restrictions.  Ask your health care provider how your surgical site will be marked or identified.  You may be given antibiotic medicine to help prevent  infection.  If your health care provider prescribes physical therapy, do exercises as instructed.  Do not use any tobacco products, such as cigarettes, chewing tobacco, or e-cigarettes. If you need help quitting, ask your health care provider.  You may have a physical exam.  You may have tests, such as: ? X-rays. ? MRI. ? CT scan. ? Bone scans.  You may have a blood or urine sample taken.  Plan to have someone take you home  after the procedure.  If you will be going home right after the procedure, plan to have someone with you for at least 24 hours. It is recommended that you have someone to help care for you for at least 4-6 weeks after your procedure. What happens during the procedure?  To reduce your risk of infection: ? Your health care team will wash or sanitize their hands. ? Your skin will be washed with soap.  An IV tube will be inserted into one of your veins.  You will be given one or more of the following: ? A medicine to help you relax (sedative). ? A medicine to numb the area (local anesthetic). ? A medicine to make you fall asleep (general anesthetic). ? A medicine that is injected into your spine to numb the area below and slightly above the injection site (spinal anesthetic). ? A medicine that is injected into an area of your body to numb everything below the injection site (regional anesthetic).  An incision will be made in your knee.  Damaged cartilage and bone will be removed from your femur, tibia, and patella.  Parts of the prosthesis (liners) will be placed over the areas of bone and cartilage that were removed. A metal liner will be placed over your femur, and plastic liners will be placed over your tibia and the underside of your patella.  One or more small tubes (drains) may be placed near your incision to help drain extra fluid from your surgical site.  Your incision will be closed with stitches (sutures), skin glue, or adhesive strips. Medicine may be applied to your incision.  A bandage (dressing) will be placed over your incision. The procedure may vary among health care providers and hospitals. What happens after the procedure?  Your blood pressure, heart rate, breathing rate, and blood oxygen level will be monitored often until the medicines you were given have worn off.  You may continue to receive fluids and medicines through an IV tube.  You will have some pain. Pain  medicines will be available to help you.  You may have fluid coming from one or more drains in your incision.  You may have to wear compression stockings. These stockings help to prevent blood clots and reduce swelling in your legs.  You will be encouraged to move around as much as possible.  You may be given a continuous passive motion machine to use at home. You will be shown how to use this machine.  Do not drive for 24 hours if you received a sedative. This information is not intended to replace advice given to you by your health care provider. Make sure you discuss any questions you have with your health care provider. Document Released: 04/26/2000 Document Revised: 02/21/2016 Document Reviewed: 12/25/2014 Elsevier Interactive Patient Education  2018 [place].  Total Knee Replacement, Care After Refer to this sheet in the next few weeks. These instructions provide you with information about caring for yourself after your procedure. Your health care provider may also give  you more specific instructions. Your treatment has been planned according to current medical practices, but problems sometimes occur. Call your health care provider if you have any problems or questions after your procedure. What can I expect after the procedure? After the procedure, it is common to have:  Pain and swelling.  A small amount of blood or clear fluid coming from your incision.  Limited range of motion.  Follow these instructions at home: Medicines  Take over-the-counter and prescription medicines only as told by your health care provider.  If you were prescribed an antibiotic medicine, take it as told by your health care provider. Do not stop taking the antibiotic even if you start to feel better.  If you were prescribed a blood thinner (anticoagulant), take it as told by your health care provider. If you have a splint or brace:  Wear the immobilizer as told by your health care provider.  Remove it only as told by your health care provider.  Loosen the immobilizer if your toes tingle, become numb, or turn cold and blue.  Do not let your immobilizer get wet if it is not waterproof.  Keep the immobilizer clean. Bathing   Do not take baths, swim, or use a hot tub until your health care provider approves. Ask your health care provider if you can take showers. You may only be allowed to take sponge baths for bathing.  If you have an immobilizer that is not waterproof, cover it with a watertight covering when you take a bath or shower.  Keep your bandage (dressing) dry until your health care provider says it can be removed. Incision care and drain care  Check your incision area and drain site every day for signs of infection. Check for: ? More redness, swelling, or pain. ? More fluid or blood. ? Warmth. ? Pus or a bad smell.  Follow instructions from your health care provider about how to take care of your incision. Make sure you: ? Wash your hands with soap and water before you change your dressing. If soap and water are not available, use hand sanitizer. ? Change your dressing as told by your health care provider. ? Leave stitches (sutures), skin glue, or adhesive strips in place. These skin closures may need to stay in place for 2 weeks or longer. If adhesive strip edges start to loosen and curl up, you may trim the loose edges. Do not remove adhesive strips completely unless your health care provider tells you to do that.  If you have a drain, follow instructions from your health care provider about caring for it. Do not remove the drain tube or any dressings around the tube opening unless your health care provider approves. Managing pain, stiffness, and swelling  If directed, put ice on your knee. ? Put ice in a plastic bag. ? Place a towel between your skin and the bag. ? Leave the ice on for 20 minutes, 2-3 times per day.  If directed, apply heat to the affected  area as often as told by your health care provider. Use the heat source that your health care provider recommends, such as a moist heat pack or a heating pad. ? Place a towel between your skin and the heat source. ? Leave the heat on for 20-30 minutes. ? Remove the heat if your skin turns bright red. This is especially important if you are unable to feel pain, heat, or cold. You may have a greater risk of getting burned.  Move your toes often to avoid stiffness and to lessen swelling.  Raise (elevate) your knee above the level of your heart while you are sitting or lying down.  Wear elastic knee support for as long as told by your health care provider. Driving   Do not drive until your health care provider approves. Ask your health care provider when it is safe to drive if you have an immobilizer on your knee.  Do not drive or operate heavy machinery while taking prescription pain medicine.  Do not drive for 24 hours if you received a sedative. Activity  Do not lift anything that is heavier than 10 lb (4.5 kg) until your health care provider approves.  Do not play contact sports until your health care provider approves.  Avoid high-impact activities, including running, jumping rope, and jumping jacks.  Avoid sitting for a long time without moving. Get up and move around at least every few hours.  If physical therapy was prescribed, do exercises as told by your health care provider.  Return to your normal activities as told by your health care provider. Ask your health care provider what activities are safe for you. Safety  Do not use your leg to support your body weight until your health care provider approves. Use crutches or a walker as told by your health care provider. General instructions   Do not have any dental work done for at least 3 months after your surgery. When you do have dental work done, tell your dentist about your joint replacement.  Do not use any tobacco  products, such as cigarettes, chewing tobacco, or e-cigarettes. If you need help quitting, ask your health care provider.  Wear compression stockings as told by your health care provider. These stockings help to prevent blood clots and reduce swelling in your legs.  If you have been sent home with a continuous passive motion machine, use it as told by your health care provider.  Drink enough fluid to keep your urine clear or pale yellow.  If you have been instructed to lose weight, follow instructions from your health care provider about how to do this safely.  Keep all follow-up visits as told by your health care provider. This is important. Contact a health care provider if:  You have more redness, swelling, or pain around your incision or drain.  You have more fluid or blood coming from your incision or drain.  Your incision or drain site feels warm to the touch.  You have pus or a bad smell coming from your incision or drain.  You have a fever.  Your incision breaks open after your health care provider removes your sutures, skin glue, or adhesive tape.  Your prosthesis feels loose.  You have knee pain that does not go away. Get help right away if:  You have a rash.  You have pain or swelling in your calf or thigh.  You have shortness of breath or difficulty breathing.  You have chest pain.  Your range of motion in your knee is getting worse. This information is not intended to replace advice given to you by your health care provider. Make sure you discuss any questions you have with your health care provider. Document Released: 08/07/2004 Document Revised: 09/22/2015 Document Reviewed: 12/25/2014 Elsevier Interactive Patient Education  2018 [place].  Spinal Anesthesia and Epidural Anesthesia Spinal anesthesia and epidural anesthesia are methods of numbing the body. They are done by injecting numbing medicine (anesthetic) into the back, near  the spinal  cord. Spinal anesthesia is usually done to numb an area at and below the place where the injection is made. It is often used during surgeries of the pelvis, hips, legs, and lower abdomen. It begins to work almost immediately. Epidural anesthesia may be done to numb an area above or below the area where the injection is made. It is often used during childbirth and after major abdominal or chest surgery. It begins to work after 10-20 minutes. Tell a health care provider about:  Any allergies you have.  All medicines you are taking, including vitamins, herbs, eye drops, creams, and over-the-counter medicines.  Any problems you or family members have had with anesthetic medicines.  Any blood disorders you have.  Any surgeries you have had.  Any medical conditions you have.  Whether you are pregnant or may be pregnant.  Any recent alcohol, tobacco, or drug use. What are the risks? Generally, this is a safe procedure. However, problems may occur, including:  Headache.  A drop in blood pressure. In some cases, this can lead to a heart attack or a stroke.  Nerve damage.  Infection.  Allergic reaction to medicines.  Seizures.  Spinal fluid leak.  Bleeding around the injection site.  Inability to breathe. If this happens, a tube may be put into your windpipe (trachea) and a machine may be used to help you breathe until the anesthetic wears off.  Long-lasting numbness, pain, or loss of function of body parts.  Nausea and vomiting.  Dizziness and fainting.  Shivering.  Itching.  What happens before the procedure? Staying hydrated Follow instructions from your health care provider about hydration, which may include:  Up to 2 hours before the procedure - you may continue to drink clear liquids, such as water, clear fruit juice, black coffee, and plain tea.  Eating and drinking restrictions Follow instructions from your health care provider about eating and drinking, which  may include:  8 hours before the procedure - stop eating heavy meals or foods such as meat, fried foods, or fatty foods.  6 hours before the procedure - stop eating light meals or foods, such as toast or cereal.  6 hours before the procedure - stop drinking milk or drinks that contain milk.  2 hours before the procedure - stop drinking clear liquids.  Medicine Ask your health care provider about:  Changing or stopping your regular medicines. This is especially important if you are taking diabetes medicines or blood thinners.  Changing or stopping your dietary supplements.  Taking medicines such as aspirin and ibuprofen. These medicines can thin your blood. Do not take these medicines before your procedure if your health care provider instructs you not to.  General instructions   Do not use any tobacco products, such as cigarettes, chewing tobacco, and electronic cigarettes, for as long as possible before your procedure. If you need help quitting, ask your health care provider.  Ask your health care provider if you will have to stay overnight at the hospital or clinic.  If you will not have to stay overnight: ? Plan to have someone take you home from the hospital or clinic. ? Plan to have someone with you for 24 hours. What happens during the procedure?  A health care provider will put patches on your chest, a cuff around your arm, or a sensor device on your finger. These will be attached to monitors that allow your health care provider to watch your blood pressure, pulse, and oxygen  levels to make sure that the anesthetic does not cause any problems.  An IV tube may be inserted into one of your veins. The tube will be used to give you fluids and medicines throughout the procedure as needed.  You may be given a medicine to help you relax (sedative).  You will be asked to sit up or to lie on your side with your knees and your chin bent toward your chest. These positions open up the  space between the bones in your back, making it easier to inject the medicine.  The area of your back where the medicine will be injected will be cleaned.  A medicine called a local anesthetic may be injected to numb the area where the spinal or epidural anesthetic will be injected.  A needle will be inserted between the bones of your back. While this is being done: ? Continue to breathe normally. ? Stay as still and quiet as you can. ? If you feel a tingling shock or pain going down your leg, tell your health care provider but try not to move.  The spinal or epidural anesthetic will be injected.  If you receive an epidural anesthetic and need more than one dose, a tiny, flexible tube (catheter) will be placed where the anesthetic was injected. Additional doses will be given through the catheter. If you need pain medicine after the procedure, the catheter will be kept in place.  If an epidural catheter has not been placed in your injection site or left there, a small bandage (dressing) will be placed over the injection site. The procedure may vary among health care providers and hospitals. What happens after the procedure?  You will need to stay in bed until your health care provider says it safe for you to walk.  Your blood pressure, heart rate, breathing rate, and blood oxygen level will be monitored until the medicines you were given have worn off.  If you have a catheter, it will be removed when it is no longer needed.  Do not drive for 24 hours if you received a sedative.  It is common to have nausea and itching. There are medicines that can help with these side effects. It is also common to have: ? Sleepiness. ? Vomiting. ? Numbness or tingling in your legs. ? Trouble urinating. This information is not intended to replace advice given to you by your health care provider. Make sure you discuss any questions you have with your health care provider. Document Released: 04/10/2003  Document Revised: 07/01/2015 Document Reviewed: 05/12/2015 Elsevier Interactive Patient Education  2018 [employer].  Spinal Anesthesia and Epidural Anesthesia, Care After These instructions give you information about caring for yourself after your procedure. Your doctor may also give you more specific instructions. Call your doctor if you have any problems or questions after your procedure. Follow these instructions at home: For at least 24 hours after the procedure:   Do not: ? Do activities where you could fall or get hurt (injured). ? Drive. ? Use heavy machinery. ? Drink alcohol. ? Take sleeping pills or medicines that make you sleepy (drowsy). ? Make important decisions. ? Sign legal documents. ? Take care of children on your own.  Rest. Eating and drinking  If you throw up (vomit), drink water, juice, or soup when you can drink without throwing up.  Make sure you do not feel like throwing up (are not nauseous) before you eat.  Follow the diet that is recommended by your  doctor. General instructions  Have a responsible adult stay with you until you are awake and alert.  Take over-the-counter and prescription medicines only as told by your doctor.  If you smoke, do not smoke unless an adult is watching.  Keep all follow-up visits as told by your doctor. This is important. Contact a doctor if:  It has been more than one day since your procedure and you feel like throwing up.  It has been more than one day since your procedure and you throw up.  You have a rash. Get help right away if:  You have a fever.  You have a headache that lasts a long time.  You have a very bad headache.  Your vision is blurry.  You see two of a single object (double vision).  You are dizzy or light-headed.  You faint.  Your arms or legs tingle, feel weak, or get numb.  You have trouble breathing.  You cannot pee (urinate). This information is not intended to replace advice  given to you by your health care provider. Make sure you discuss any questions you have with your health care provider. Document Released: 05/12/2015 Document Revised: 09/11/2015 Document Reviewed: 05/12/2015 Elsevier Interactive Patient Education  2018 [place] Anesthesia, Adult General anesthesia is the use of medicines to make a person "go to sleep" (be unconscious) for a medical procedure. General anesthesia is often recommended when a procedure:  Is long.  Requires you to be still or in an unusual position.  Is major and can cause you to lose blood.  Is impossible to do without general anesthesia.  The medicines used for general anesthesia are called general anesthetics. In addition to making you sleep, the medicines:  Prevent pain.  Control your blood pressure.  Relax your muscles.  Tell a health care provider about:  Any allergies you have.  All medicines you are taking, including vitamins, herbs, eye drops, creams, and over-the-counter medicines.  Any problems you or family members have had with anesthetic medicines.  Types of anesthetics you have had in the past.  Any bleeding disorders you have.  Any surgeries you have had.  Any medical conditions you have.  Any history of heart or lung conditions, such as heart failure, sleep apnea, or chronic obstructive pulmonary disease (COPD).  Whether you are pregnant or may be pregnant.  Whether you use tobacco, alcohol, marijuana, or street drugs.  Any history of Armed forces logistics/support/administrative officer.  Any history of depression or anxiety. What are the risks? Generally, this is a safe procedure. However, problems may occur, including:  Allergic reaction to anesthetics.  Lung and heart problems.  Inhaling food or liquids from your stomach into your lungs (aspiration).  Injury to nerves.  Waking up during your procedure and being unable to move (rare).  Extreme agitation or a state of mental confusion (delirium)  when you wake up from the anesthetic.  Air in the bloodstream, which can lead to stroke.  These problems are more likely to develop if you are having a major surgery or if you have an advanced medical condition. You can prevent some of these complications by answering all of your health care provider's questions thoroughly and by following all pre-procedure instructions. General anesthesia can cause side effects, including:  Nausea or vomiting  A sore throat from the breathing tube.  Feeling cold or shivery.  Feeling tired, washed out, or achy.  Sleepiness or drowsiness.  Confusion or agitation.  What happens before the procedure?  Staying hydrated Follow instructions from your health care provider about hydration, which may include:  Up to 2 hours before the procedure - you may continue to drink clear liquids, such as water, clear fruit juice, black coffee, and plain tea.  Eating and drinking restrictions Follow instructions from your health care provider about eating and drinking, which may include:  8 hours before the procedure - stop eating heavy meals or foods such as meat, fried foods, or fatty foods.  6 hours before the procedure - stop eating light meals or foods, such as toast or cereal.  6 hours before the procedure - stop drinking milk or drinks that contain milk.  2 hours before the procedure - stop drinking clear liquids.  Medicines  Ask your health care provider about: ? Changing or stopping your regular medicines. This is especially important if you are taking diabetes medicines or blood thinners. ? Taking medicines such as aspirin and ibuprofen. These medicines can thin your blood. Do not take these medicines before your procedure if your health care provider instructs you not to. ? Taking new dietary supplements or medicines. Do not take these during the week before your procedure unless your health care provider approves them.  If you are told to take a  medicine or to continue taking a medicine on the day of the procedure, take the medicine with sips of water. General instructions   Ask if you will be going home the same day, the following day, or after a longer hospital stay. ? Plan to have someone take you home. ? Plan to have someone stay with you for the first 24 hours after you leave the hospital or clinic.  For 3-6 weeks before the procedure, try not to use any tobacco products, such as cigarettes, chewing tobacco, and e-cigarettes.  You may brush your teeth on the morning of the procedure, but make sure to spit out the toothpaste. What happens during the procedure?  You will be given anesthetics through a mask and through an IV tube in one of your veins.  You may receive medicine to help you relax (sedative).  As soon as you are asleep, a breathing tube may be used to help you breathe.  An anesthesia specialist will stay with you throughout the procedure. He or she will help keep you comfortable and safe by continuing to give you medicines and adjusting the amount of medicine that you get. He or she will also watch your blood pressure, pulse, and oxygen levels to make sure that the anesthetics do not cause any problems.  If a breathing tube was used to help you breathe, it will be removed before you wake up. The procedure may vary among health care providers and hospitals. What happens after the procedure?  You will wake up, often slowly, after the procedure is complete, usually in a recovery area.  Your blood pressure, heart rate, breathing rate, and blood oxygen level will be monitored until the medicines you were given have worn off.  You may be given medicine to help you calm down if you feel anxious or agitated.  If you will be going home the same day, your health care provider may check to make sure you can stand, drink, and urinate.  Your health care providers will treat your pain and side effects before you go  home.  Do not drive for 24 hours if you received a sedative.  You may: ? Feel nauseous and vomit. ? Have a sore throat. ?  Have mental slowness. ? Feel cold or shivery. ? Feel sleepy. ? Feel tired. ? Feel sore or achy, even in parts of your body where you did not have surgery. This information is not intended to replace advice given to you by your health care provider. Make sure you discuss any questions you have with your health care provider. Document Released: 04/27/2007 Document Revised: 07/01/2015 Document Reviewed: 01/02/2015 Elsevier Interactive Patient Education  2018 [place] Anesthesia, Adult, Care After These instructions provide you with information about caring for yourself after your procedure. Your health care provider may also give you more specific instructions. Your treatment has been planned according to current medical practices, but problems sometimes occur. Call your health care provider if you have any problems or questions after your procedure. What can I expect after the procedure? After the procedure, it is common to have:  Vomiting.  A sore throat.  Mental slowness.  It is common to feel:  Nauseous.  Cold or shivery.  Sleepy.  Tired.  Sore or achy, even in parts of your body where you did not have surgery.  Follow these instructions at home: For at least 24 hours after the procedure:  Do not: ? Participate in activities where you could fall or become injured. ? Drive. ? Use heavy machinery. ? Drink alcohol. ? Take sleeping pills or medicines that cause drowsiness. ? Make important decisions or sign legal documents. ? Take care of children on your own.  Rest. Eating and drinking  If you vomit, drink water, juice, or soup when you can drink without vomiting.  Drink enough fluid to keep your urine clear or pale yellow.  Make sure you have little or no nausea before eating solid foods.  Follow the diet recommended by your  health care provider. General instructions  Have a responsible adult stay with you until you are awake and alert.  Return to your normal activities as told by your health care provider. Ask your health care provider what activities are safe for you.  Take over-the-counter and prescription medicines only as told by your health care provider.  If you smoke, do not smoke without supervision.  Keep all follow-up visits as told by your health care provider. This is important. Contact a health care provider if:  You continue to have nausea or vomiting at home, and medicines are not helpful.  You cannot drink fluids or start eating again.  You cannot urinate after 8-12 hours.  You develop a skin rash.  You have fever.  You have increasing redness at the site of your procedure. Get help right away if:  You have difficulty breathing.  You have chest pain.  You have unexpected bleeding.  You feel that you are having a life-threatening or urgent problem. This information is not intended to replace advice given to you by your health care provider. Make sure you discuss any questions you have with your health care provider. Document Released: 04/26/2000 Document Revised: 06/23/2015 Document Reviewed: 01/02/2015 Elsevier Interactive Patient Education  [employer].

## 2017-02-03 NOTE — Telephone Encounter (Signed)
Called [employer] to precert the TKR [id number]  Spoke to [place] # [phone] Fax number for clinicals [phone] Have faxed

## 2017-02-07 NOTE — Telephone Encounter (Signed)
Has been approved.

## 2017-02-09 ENCOUNTER — Encounter (HOSPITAL_COMMUNITY): Payer: Self-pay

## 2017-02-09 ENCOUNTER — Encounter (HOSPITAL_COMMUNITY)
Admission: RE | Admit: 2017-02-09 | Discharge: 2017-02-09 | Disposition: A | Discharge status: Home / Self Care | Payer: BLUE CROSS/BLUE SHIELD | Source: Ambulatory Visit | Attending: Orthopedic Surgery | Admitting: Orthopedic Surgery

## 2017-02-09 ENCOUNTER — Other Ambulatory Visit: Payer: Self-pay

## 2017-02-09 DIAGNOSIS — Z01812 Encounter for preprocedural laboratory examination: Secondary | ICD-10-CM | POA: Diagnosis not present

## 2017-02-09 HISTORY — DX: Unspecified osteoarthritis, unspecified site: M19.90

## 2017-02-09 LAB — SURGICAL PCR SCREEN
MRSA, PCR: NEGATIVE
Staphylococcus aureus: NEGATIVE

## 2017-02-09 LAB — COMPREHENSIVE METABOLIC PANEL
ALT: 24 U/L (ref 17–63)
ANION GAP: 11 (ref 5–15)
AST: 20 U/L (ref 15–41)
Albumin: 4.1 g/dL (ref 3.5–5.0)
Alkaline Phosphatase: 86 U/L (ref 38–126)
BUN: 17 mg/dL (ref 6–20)
CALCIUM: 9.3 mg/dL (ref 8.9–10.3)
CHLORIDE: 104 mmol/L (ref 101–111)
CO2: 27 mmol/L (ref 22–32)
CREATININE: 1.11 mg/dL (ref 0.61–1.24)
Glucose, Bld: 138 mg/dL — ABNORMAL HIGH (ref 65–99)
Potassium: 3.6 mmol/L (ref 3.5–5.1)
Sodium: 142 mmol/L (ref 135–145)
Total Bilirubin: 0.8 mg/dL (ref 0.3–1.2)
Total Protein: 7.3 g/dL (ref 6.5–8.1)

## 2017-02-09 LAB — CBC WITH DIFFERENTIAL/PLATELET
Basophils Absolute: 0 10*3/uL (ref 0.0–0.1)
Basophils Relative: 1 %
EOS ABS: 0.1 10*3/uL (ref 0.0–0.7)
EOS PCT: 2 %
HCT: 44.2 % (ref 39.0–52.0)
Hemoglobin: 15.1 g/dL (ref 13.0–17.0)
LYMPHS ABS: 1.5 10*3/uL (ref 0.7–4.0)
LYMPHS PCT: 34 %
MCH: 29.5 pg (ref 26.0–34.0)
MCHC: 34.2 g/dL (ref 30.0–36.0)
MCV: 86.3 fL (ref 78.0–100.0)
MONO ABS: 0.5 10*3/uL (ref 0.1–1.0)
MONOS PCT: 13 %
Neutro Abs: 2.2 10*3/uL (ref 1.7–7.7)
Neutrophils Relative %: 50 %
PLATELETS: 217 10*3/uL (ref 150–400)
RBC: 5.12 MIL/uL (ref 4.22–5.81)
RDW: 12.3 % (ref 11.5–15.5)
WBC: 4.3 10*3/uL (ref 4.0–10.5)

## 2017-02-09 LAB — ABO/RH: ABO/RH(D): O POS

## 2017-02-09 LAB — PROTIME-INR
INR: 1.03
Prothrombin Time: 13.4 seconds (ref 11.4–15.2)

## 2017-02-09 LAB — PREPARE RBC (CROSSMATCH)

## 2017-02-09 LAB — APTT: aPTT: 35 seconds (ref 24–36)

## 2017-02-14 MED ORDER — TRANEXAMIC ACID 1000 MG/10ML IV SOLN
1000.0000 mg | INTRAVENOUS | Status: AC
  Administered 2017-02-15: 1000 mg via INTRAVENOUS
  Filled 2017-02-14: qty 10

## 2017-02-14 NOTE — H&P (Signed)
Admission history and physical for knee replacement surgery  Chief complaint right knee pain  History Christopher [name] is a 57 year old male who had some type of knee procedure 15 years ago he was unsure he did well initially but over the last few years he has noted increasing pain which became severe in the right knee primarily over the medial joint line with progressive deformity of varus and constant aching pain with decreased range of motion associated with swelling.  Prior treatment included diclofenac and oxycodone but eventually that stopped helping his pain.  He reports having had an injection which did not help as well.  He does have some trouble with certain activities of daily living he has come to the end of the road in terms of tolerating his situation and presented questing for right total knee replacement 2-3 months after initial evaluation  Review of Systems  Musculoskeletal: Positive for joint pain. Negative for falls.  Neurological: Negative for tingling, sensory change and focal weakness.  All other systems reviewed and are negative.   Active Medications      Current Meds  Medication Sig  . amLODipine (NORVASC) 5 MG tablet Take 1 tablet (5 mg total) by mouth daily.  [name] aspirin EC 81 MG tablet Take 81 mg by mouth daily.  [name] atorvastatin (LIPITOR) 10 MG tablet TAKE 1 TABLET BY MOUTH DAILY  . diclofenac (VOLTAREN) 75 MG EC tablet Take 1 tablet (75 mg total) by mouth 2 (two) times daily.  . metoprolol tartrate (LOPRESSOR) 25 MG tablet Take 0.5 tablets (12.5 mg total) by mouth 2 (two) times daily.        Past Surgical History:  Procedure Laterality Date  . HERNIA REPAIR Right   . KNEE ARTHROSCOPY  right knee   Family History  Problem Relation Age of Onset  . Arthritis Unknown   . Cancer Unknown   . Diabetes Unknown   . Other Christopher [name] 77       sepsis  . CAD Christopher [name] 50  . CAD Christopher [name] 18  . Diabetes Christopher [name]    Social History   Tobacco Use  . Smoking status: Never Smoker  .  Smokeless tobacco: Never Used  Substance Use Topics  . Alcohol use: Yes    Comment: occasional beer  . Drug use: No        Past Medical History:  Diagnosis Date  . HTN (hypertension)    . Hyperlipemia              Allergies  Allergen Reactions  . Hydrocodone Itching      BP (!) 162/92   Pulse 85   Ht 5\' 11"  (1.803 m)   Wt 188 lb (85.3 kg)   BMI 26.22 kg/m      Physical Exam  Constitutional: [name] oriented to person, place, and time. He appears well-developed and well-nourished.  Neurological: [name] alert and oriented to person, place, and time.  Psychiatric: He has a normal mood and affect. His behavior is normal. Judgment and thought content normal.  His upper extremities show normal alignment range of motion stability and strength skin is normal pulses are excellent temperature is normal no edema lymph nodes are negative sensation is intact there are no pathologic reflexes    Ortho Exam   Ambulatory status he is has a severe varus thrust he has a limp favoring the right leg Right knee  He has a varus knee flexion contracture 5 degrees knee flexion 90 degrees ligaments are stable extension strength  is normal muscle tone is excellent skin is intact no rash lesions ulceration or subcutaneous nodules are palpated neurovascular exam shows normal sensation in the right leg normal reflexes normal coordination of the right lower extremity normal pulse and perfusion and capillary refill     Left knee shows medial joint line tenderness no effusion restricted range of motion no instability, strength is normal skin was intact normal distal pulse dorsalis pedis lymph nodes negative sensation normal and reflexes normal   Medical decision-making   Imaging: Right knee shows severe varus and severe collapse medial compartment, patellofemoral area shows slight subluxation of the median ridge           Name Primary?  [name]     . Primary osteoarthritis of right knee Yes        The  procedure has been fully reviewed with the patient; The risks and benefits of surgery have been discussed and explained and understood. Alternative treatment has also been reviewed, questions were encouraged and answered. The postoperative plan is also been reviewed.   Right total knee January 15     [name], MD

## 2017-02-15 ENCOUNTER — Inpatient Hospital Stay (HOSPITAL_COMMUNITY)
Admission: RE | Admit: 2017-02-15 | Discharge: 2017-02-17 | DRG: 470 | Disposition: A | Discharge status: Home Health | Payer: BLUE CROSS/BLUE SHIELD | Source: Ambulatory Visit | Attending: Orthopedic Surgery | Admitting: Orthopedic Surgery

## 2017-02-15 ENCOUNTER — Inpatient Hospital Stay (HOSPITAL_COMMUNITY): Payer: BLUE CROSS/BLUE SHIELD | Admitting: Anesthesiology

## 2017-02-15 ENCOUNTER — Encounter (HOSPITAL_COMMUNITY)
Admission: RE | Disposition: A | Discharge status: Home Health | Payer: Self-pay | Source: Ambulatory Visit | Attending: Orthopedic Surgery

## 2017-02-15 ENCOUNTER — Inpatient Hospital Stay (HOSPITAL_COMMUNITY): Payer: BLUE CROSS/BLUE SHIELD

## 2017-02-15 ENCOUNTER — Encounter (HOSPITAL_COMMUNITY): Payer: Self-pay | Admitting: *Deleted

## 2017-02-15 ENCOUNTER — Other Ambulatory Visit: Payer: Self-pay

## 2017-02-15 DIAGNOSIS — Z7982 Long term (current) use of aspirin: Secondary | ICD-10-CM

## 2017-02-15 DIAGNOSIS — E7849 Other hyperlipidemia: Secondary | ICD-10-CM | POA: Diagnosis not present

## 2017-02-15 DIAGNOSIS — M25761 Osteophyte, right knee: Secondary | ICD-10-CM | POA: Diagnosis present

## 2017-02-15 DIAGNOSIS — I4891 Unspecified atrial fibrillation: Secondary | ICD-10-CM | POA: Diagnosis not present

## 2017-02-15 DIAGNOSIS — I97191 Other postprocedural cardiac functional disturbances following other surgery: Secondary | ICD-10-CM | POA: Diagnosis not present

## 2017-02-15 DIAGNOSIS — Z79899 Other long term (current) drug therapy: Secondary | ICD-10-CM | POA: Diagnosis not present

## 2017-02-15 DIAGNOSIS — I371 Nonrheumatic pulmonary valve insufficiency: Secondary | ICD-10-CM | POA: Diagnosis present

## 2017-02-15 DIAGNOSIS — M25561 Pain in right knee: Secondary | ICD-10-CM | POA: Diagnosis present

## 2017-02-15 DIAGNOSIS — I119 Hypertensive heart disease without heart failure: Secondary | ICD-10-CM | POA: Diagnosis present

## 2017-02-15 DIAGNOSIS — E785 Hyperlipidemia, unspecified: Secondary | ICD-10-CM | POA: Diagnosis present

## 2017-02-15 DIAGNOSIS — M1710 Unilateral primary osteoarthritis, unspecified knee: Secondary | ICD-10-CM | POA: Diagnosis present

## 2017-02-15 DIAGNOSIS — M1711 Unilateral primary osteoarthritis, right knee: Secondary | ICD-10-CM | POA: Diagnosis present

## 2017-02-15 DIAGNOSIS — Z885 Allergy status to narcotic agent status: Secondary | ICD-10-CM

## 2017-02-15 DIAGNOSIS — Z96651 Presence of right artificial knee joint: Secondary | ICD-10-CM

## 2017-02-15 DIAGNOSIS — I10 Essential (primary) hypertension: Secondary | ICD-10-CM | POA: Diagnosis not present

## 2017-02-15 DIAGNOSIS — Z8249 Family history of ischemic heart disease and other diseases of the circulatory system: Secondary | ICD-10-CM

## 2017-02-15 DIAGNOSIS — Y838 Other surgical procedures as the cause of abnormal reaction of the patient, or of later complication, without mention of misadventure at the time of the procedure: Secondary | ICD-10-CM | POA: Diagnosis not present

## 2017-02-15 HISTORY — PX: TOTAL KNEE ARTHROPLASTY: SHX125

## 2017-02-15 SURGERY — ARTHROPLASTY, KNEE, TOTAL
Anesthesia: General | Site: Knee | Laterality: Right

## 2017-02-15 MED ORDER — CHLORHEXIDINE GLUCONATE 4 % EX LIQD: 60.0000 mL | Freq: Once | CUTANEOUS | Status: DC

## 2017-02-15 MED ORDER — MENTHOL 3 MG MT LOZG: 1.0000 | LOZENGE | OROMUCOSAL | Status: DC | PRN

## 2017-02-15 MED ORDER — MIDAZOLAM HCL 2 MG/2ML IJ SOLN
1.0000 mg | INTRAMUSCULAR | Status: DC
  Administered 2017-02-15: 2 mg via INTRAVENOUS

## 2017-02-15 MED ORDER — ACETAMINOPHEN 10 MG/ML IV SOLN
1000.0000 mg | Freq: Once | INTRAVENOUS | Status: AC
  Administered 2017-02-15: 1000 mg via INTRAVENOUS
  Filled 2017-02-15: qty 100

## 2017-02-15 MED ORDER — PHENOL 1.4 % MT LIQD: 1.0000 | OROMUCOSAL | Status: DC | PRN

## 2017-02-15 MED ORDER — SUGAMMADEX SODIUM 200 MG/2ML IV SOLN
INTRAVENOUS | Status: AC
  Filled 2017-02-15: qty 2

## 2017-02-15 MED ORDER — BUPIVACAINE LIPOSOME 1.3 % IJ SUSP
INTRAMUSCULAR | Status: AC
  Filled 2017-02-15: qty 20

## 2017-02-15 MED ORDER — ONDANSETRON HCL 4 MG/2ML IJ SOLN
4.0000 mg | Freq: Four times a day (QID) | INTRAMUSCULAR | Status: DC | PRN
Start: 2017-02-15 — End: 2017-02-17

## 2017-02-15 MED ORDER — DEXTROSE 5 % IV SOLN
500.0000 mg | Freq: Four times a day (QID) | INTRAVENOUS | Status: DC | PRN
  Filled 2017-02-15: qty 5

## 2017-02-15 MED ORDER — PHENYLEPHRINE 40 MCG/ML (10ML) SYRINGE FOR IV PUSH (FOR BLOOD PRESSURE SUPPORT)
PREFILLED_SYRINGE | INTRAVENOUS | Status: AC
  Filled 2017-02-15: qty 10

## 2017-02-15 MED ORDER — OXYCODONE HCL 5 MG PO TABS
5.0000 mg | ORAL_TABLET | Freq: Once | ORAL | Status: AC
  Administered 2017-02-15: 5 mg via ORAL
  Filled 2017-02-15: qty 1

## 2017-02-15 MED ORDER — ONDANSETRON HCL 4 MG/2ML IJ SOLN
4.0000 mg | Freq: Once | INTRAMUSCULAR | Status: AC
  Administered 2017-02-15: 4 mg via INTRAVENOUS
  Filled 2017-02-15: qty 2

## 2017-02-15 MED ORDER — CELECOXIB 400 MG PO CAPS
400.0000 mg | ORAL_CAPSULE | Freq: Once | ORAL | Status: AC
  Administered 2017-02-15: 400 mg via ORAL
  Filled 2017-02-15: qty 1

## 2017-02-15 MED ORDER — SODIUM CHLORIDE 0.9 % IV SOLN
INTRAVENOUS | Status: DC
  Administered 2017-02-15: 14:00:00 via INTRAVENOUS

## 2017-02-15 MED ORDER — SUGAMMADEX SODIUM 200 MG/2ML IV SOLN
INTRAVENOUS | Status: DC | PRN
  Administered 2017-02-15: 170.6 mg via INTRAVENOUS

## 2017-02-15 MED ORDER — ACETAMINOPHEN 650 MG RE SUPP: 650.0000 mg | RECTAL | Status: DC | PRN

## 2017-02-15 MED ORDER — FENTANYL CITRATE (PF) 100 MCG/2ML IJ SOLN
25.0000 ug | Freq: Once | INTRAMUSCULAR | Status: AC
  Administered 2017-02-15: 25 ug via INTRAVENOUS

## 2017-02-15 MED ORDER — SODIUM CHLORIDE 0.9 % IJ SOLN
INTRAMUSCULAR | Status: AC
  Filled 2017-02-15: qty 40

## 2017-02-15 MED ORDER — HYDROMORPHONE HCL 1 MG/ML IJ SOLN
0.5000 mg | INTRAMUSCULAR | Status: DC | PRN
  Administered 2017-02-15 (×2): 0.5 mg via INTRAVENOUS
  Filled 2017-02-15 (×2): qty 1

## 2017-02-15 MED ORDER — DIPHENHYDRAMINE HCL 12.5 MG/5ML PO ELIX: 12.5000 mg | ORAL_SOLUTION | ORAL | Status: DC | PRN

## 2017-02-15 MED ORDER — HYDROCODONE-ACETAMINOPHEN 7.5-325 MG PO TABS
1.0000 | ORAL_TABLET | Freq: Four times a day (QID) | ORAL | Status: DC
  Administered 2017-02-15 – 2017-02-17 (×8): 1 via ORAL
  Filled 2017-02-15 (×8): qty 1

## 2017-02-15 MED ORDER — CELECOXIB 100 MG PO CAPS
200.0000 mg | ORAL_CAPSULE | Freq: Two times a day (BID) | ORAL | Status: DC
  Administered 2017-02-15 – 2017-02-17 (×4): 200 mg via ORAL
  Filled 2017-02-15 (×4): qty 2

## 2017-02-15 MED ORDER — MIDAZOLAM HCL 2 MG/2ML IJ SOLN
INTRAMUSCULAR | Status: AC
  Filled 2017-02-15: qty 2

## 2017-02-15 MED ORDER — 0.9 % SODIUM CHLORIDE (POUR BTL) OPTIME
TOPICAL | Status: DC | PRN
  Administered 2017-02-15: 1000 mL

## 2017-02-15 MED ORDER — ACETAMINOPHEN 325 MG PO TABS: 650.0000 mg | ORAL_TABLET | ORAL | Status: DC | PRN

## 2017-02-15 MED ORDER — LIDOCAINE HCL (PF) 1 % IJ SOLN
INTRAMUSCULAR | Status: AC
  Filled 2017-02-15: qty 10

## 2017-02-15 MED ORDER — METOPROLOL TARTRATE 25 MG PO TABS
12.5000 mg | ORAL_TABLET | Freq: Two times a day (BID) | ORAL | Status: DC
  Administered 2017-02-15: 12.5 mg via ORAL
  Filled 2017-02-15 (×2): qty 1

## 2017-02-15 MED ORDER — CEFAZOLIN SODIUM-DEXTROSE 2-4 GM/100ML-% IV SOLN
2.0000 g | INTRAVENOUS | Status: AC
  Administered 2017-02-15: 2 g via INTRAVENOUS
  Filled 2017-02-15: qty 100

## 2017-02-15 MED ORDER — ALUM & MAG HYDROXIDE-SIMETH 200-200-20 MG/5ML PO SUSP: 30.0000 mL | ORAL | Status: DC | PRN

## 2017-02-15 MED ORDER — POLYETHYLENE GLYCOL 3350 17 G PO PACK: 17.0000 g | PACK | Freq: Every day | ORAL | Status: DC | PRN

## 2017-02-15 MED ORDER — PROPOFOL 10 MG/ML IV BOLUS
INTRAVENOUS | Status: AC
  Filled 2017-02-15: qty 40

## 2017-02-15 MED ORDER — BUPIVACAINE-EPINEPHRINE (PF) 0.5% -1:200000 IJ SOLN
INTRAMUSCULAR | Status: DC | PRN
  Administered 2017-02-15: 20 mL via PERINEURAL

## 2017-02-15 MED ORDER — PREGABALIN 50 MG PO CAPS
50.0000 mg | ORAL_CAPSULE | Freq: Once | ORAL | Status: AC
  Administered 2017-02-15: 50 mg via ORAL
  Filled 2017-02-15: qty 1

## 2017-02-15 MED ORDER — FENTANYL CITRATE (PF) 100 MCG/2ML IJ SOLN
INTRAMUSCULAR | Status: AC
  Filled 2017-02-15: qty 2

## 2017-02-15 MED ORDER — HYDRALAZINE HCL 20 MG/ML IJ SOLN
5.0000 mg | Freq: Once | INTRAMUSCULAR | Status: AC
  Administered 2017-02-15: 5 mg via INTRAVENOUS

## 2017-02-15 MED ORDER — PROPOFOL 10 MG/ML IV BOLUS
INTRAVENOUS | Status: DC | PRN
  Administered 2017-02-15: 200 mg via INTRAVENOUS

## 2017-02-15 MED ORDER — DOCUSATE SODIUM 100 MG PO CAPS
100.0000 mg | ORAL_CAPSULE | Freq: Two times a day (BID) | ORAL | Status: DC
  Administered 2017-02-15 – 2017-02-17 (×4): 100 mg via ORAL
  Filled 2017-02-15 (×4): qty 1

## 2017-02-15 MED ORDER — LABETALOL HCL 5 MG/ML IV SOLN
INTRAVENOUS | Status: AC
  Filled 2017-02-15: qty 4

## 2017-02-15 MED ORDER — ATORVASTATIN CALCIUM 10 MG PO TABS
10.0000 mg | ORAL_TABLET | Freq: Every day | ORAL | Status: DC
  Administered 2017-02-16 – 2017-02-17 (×2): 10 mg via ORAL
  Filled 2017-02-15 (×2): qty 1

## 2017-02-15 MED ORDER — LACTATED RINGERS IV SOLN
INTRAVENOUS | Status: DC
  Administered 2017-02-15 (×2): via INTRAVENOUS

## 2017-02-15 MED ORDER — ASPIRIN 81 MG PO CHEW
81.0000 mg | CHEWABLE_TABLET | Freq: Two times a day (BID) | ORAL | Status: DC
  Administered 2017-02-15 – 2017-02-17 (×4): 81 mg via ORAL
  Filled 2017-02-15 (×4): qty 1

## 2017-02-15 MED ORDER — PROPOFOL 500 MG/50ML IV EMUL
INTRAVENOUS | Status: DC | PRN
  Administered 2017-02-15: 50 ug/kg/min via INTRAVENOUS

## 2017-02-15 MED ORDER — METOCLOPRAMIDE HCL 10 MG PO TABS: 5.0000 mg | ORAL_TABLET | Freq: Three times a day (TID) | ORAL | Status: DC | PRN

## 2017-02-15 MED ORDER — FENTANYL CITRATE (PF) 250 MCG/5ML IJ SOLN
INTRAMUSCULAR | Status: AC
  Filled 2017-02-15: qty 5

## 2017-02-15 MED ORDER — SUGAMMADEX SODIUM 500 MG/5ML IV SOLN
INTRAVENOUS | Status: AC
  Filled 2017-02-15: qty 5

## 2017-02-15 MED ORDER — SODIUM CHLORIDE 0.9 % IJ SOLN
INTRAMUSCULAR | Status: AC
  Filled 2017-02-15: qty 30

## 2017-02-15 MED ORDER — DEXAMETHASONE SODIUM PHOSPHATE 10 MG/ML IJ SOLN
10.0000 mg | Freq: Once | INTRAMUSCULAR | Status: AC
  Administered 2017-02-16: 10 mg via INTRAVENOUS
  Filled 2017-02-15: qty 1

## 2017-02-15 MED ORDER — GABAPENTIN 300 MG PO CAPS
300.0000 mg | ORAL_CAPSULE | Freq: Three times a day (TID) | ORAL | Status: DC
  Administered 2017-02-15 – 2017-02-17 (×6): 300 mg via ORAL
  Filled 2017-02-15 (×6): qty 1

## 2017-02-15 MED ORDER — EPHEDRINE SULFATE 50 MG/ML IJ SOLN
INTRAMUSCULAR | Status: AC
  Filled 2017-02-15: qty 3

## 2017-02-15 MED ORDER — BUPIVACAINE LIPOSOME 1.3 % IJ SUSP
INTRAMUSCULAR | Status: DC | PRN
  Administered 2017-02-15: 60 mL

## 2017-02-15 MED ORDER — BUPIVACAINE-EPINEPHRINE (PF) 0.5% -1:200000 IJ SOLN
INTRAMUSCULAR | Status: AC
  Filled 2017-02-15: qty 30

## 2017-02-15 MED ORDER — METOCLOPRAMIDE HCL 5 MG/ML IJ SOLN: 5.0000 mg | Freq: Three times a day (TID) | INTRAMUSCULAR | Status: DC | PRN

## 2017-02-15 MED ORDER — AMLODIPINE BESYLATE 5 MG PO TABS
5.0000 mg | ORAL_TABLET | Freq: Every day | ORAL | Status: DC
  Administered 2017-02-16: 5 mg via ORAL
  Filled 2017-02-15 (×2): qty 1

## 2017-02-15 MED ORDER — SODIUM CHLORIDE 0.9 % IR SOLN
Status: DC | PRN
  Administered 2017-02-15: 3000 mL

## 2017-02-15 MED ORDER — SORBITOL 70 % SOLN: 30.0000 mL | Freq: Every day | Status: DC | PRN

## 2017-02-15 MED ORDER — KETOROLAC TROMETHAMINE 15 MG/ML IJ SOLN
15.0000 mg | Freq: Four times a day (QID) | INTRAMUSCULAR | Status: AC
  Administered 2017-02-16 (×4): 15 mg via INTRAVENOUS
  Filled 2017-02-15 (×4): qty 1

## 2017-02-15 MED ORDER — FENTANYL CITRATE (PF) 100 MCG/2ML IJ SOLN
INTRAMUSCULAR | Status: DC | PRN
  Administered 2017-02-15 (×3): 100 ug via INTRAVENOUS
  Administered 2017-02-15: 50 ug via INTRAVENOUS

## 2017-02-15 MED ORDER — HYDRALAZINE HCL 20 MG/ML IJ SOLN
INTRAMUSCULAR | Status: AC
  Filled 2017-02-15: qty 1

## 2017-02-15 MED ORDER — OXYCODONE HCL 5 MG PO TABS
10.0000 mg | ORAL_TABLET | ORAL | Status: DC | PRN
Start: 2017-02-15 — End: 2017-02-17

## 2017-02-15 MED ORDER — ONDANSETRON HCL 4 MG PO TABS: 4.0000 mg | ORAL_TABLET | Freq: Four times a day (QID) | ORAL | Status: DC | PRN

## 2017-02-15 MED ORDER — METHOCARBAMOL 1000 MG/10ML IJ SOLN
500.0000 mg | Freq: Once | INTRAVENOUS | Status: AC
  Administered 2017-02-15: 500 mg via INTRAVENOUS
  Filled 2017-02-15: qty 5

## 2017-02-15 MED ORDER — SUCCINYLCHOLINE CHLORIDE 20 MG/ML IJ SOLN
INTRAMUSCULAR | Status: AC
  Filled 2017-02-15: qty 2

## 2017-02-15 MED ORDER — FENTANYL CITRATE (PF) 100 MCG/2ML IJ SOLN
25.0000 ug | INTRAMUSCULAR | Status: DC | PRN
  Administered 2017-02-15: 50 ug via INTRAVENOUS
  Filled 2017-02-15: qty 2

## 2017-02-15 MED ORDER — TRANEXAMIC ACID 1000 MG/10ML IV SOLN
1000.0000 mg | Freq: Once | INTRAVENOUS | Status: AC
  Administered 2017-02-15: 1000 mg via INTRAVENOUS
  Filled 2017-02-15: qty 10

## 2017-02-15 MED ORDER — HYDROCODONE-ACETAMINOPHEN 10-325 MG PO TABS
1.0000 | ORAL_TABLET | ORAL | Status: DC | PRN
  Filled 2017-02-15: qty 1

## 2017-02-15 MED ORDER — ONDANSETRON HCL 4 MG/2ML IJ SOLN
INTRAMUSCULAR | Status: DC | PRN
  Administered 2017-02-15: 4 mg via INTRAVENOUS

## 2017-02-15 MED ORDER — METHOCARBAMOL 500 MG PO TABS
500.0000 mg | ORAL_TABLET | Freq: Four times a day (QID) | ORAL | Status: DC | PRN
Start: 2017-02-15 — End: 2017-02-17

## 2017-02-15 MED ORDER — MIDAZOLAM HCL 5 MG/5ML IJ SOLN
INTRAMUSCULAR | Status: DC | PRN
  Administered 2017-02-15: 0.5 mg via INTRAVENOUS
  Administered 2017-02-15: 1.5 mg via INTRAVENOUS

## 2017-02-15 MED ORDER — ROCURONIUM BROMIDE 100 MG/10ML IV SOLN
INTRAVENOUS | Status: DC | PRN
  Administered 2017-02-15: 10 mg via INTRAVENOUS
  Administered 2017-02-15: 40 mg via INTRAVENOUS

## 2017-02-15 MED ORDER — LABETALOL HCL 5 MG/ML IV SOLN
INTRAVENOUS | Status: DC | PRN
  Administered 2017-02-15 (×4): 5 mg via INTRAVENOUS

## 2017-02-15 MED ORDER — MAGNESIUM CITRATE PO SOLN: 1.0000 | Freq: Once | ORAL | Status: DC | PRN

## 2017-02-15 MED ORDER — CEFAZOLIN SODIUM-DEXTROSE 2-4 GM/100ML-% IV SOLN
2.0000 g | Freq: Four times a day (QID) | INTRAVENOUS | Status: AC
  Administered 2017-02-15 (×2): 2 g via INTRAVENOUS
  Filled 2017-02-15 (×2): qty 100

## 2017-02-15 SURGICAL SUPPLY — 71 items
BAG HAMPER (MISCELLANEOUS) ×2 IMPLANT
BANDAGE ELASTIC 4 LF NS (GAUZE/BANDAGES/DRESSINGS) ×2 IMPLANT
BANDAGE ELASTIC 6 LF NS (GAUZE/BANDAGES/DRESSINGS) ×1 IMPLANT
BANDAGE ESMARK 6X9 LF (GAUZE/BANDAGES/DRESSINGS) ×1 IMPLANT
BIT DRILL 3.2X128 (BIT) IMPLANT
BLADE HEX COATED 2.75 (ELECTRODE) ×2 IMPLANT
BLADE SAGITTAL 25.0X1.27X90 (BLADE) ×2 IMPLANT
BNDG CMPR 9X6 STRL LF SNTH (GAUZE/BANDAGES/DRESSINGS) ×1
BNDG CMPR MED 5X4 ELC HKLP NS (GAUZE/BANDAGES/DRESSINGS) ×2
BNDG CMPR MED 5X6 ELC HKLP NS (GAUZE/BANDAGES/DRESSINGS) ×1
BNDG ESMARK 6X9 LF (GAUZE/BANDAGES/DRESSINGS) ×2
CAP KNEE TOTAL 3 SIGMA ×1 IMPLANT
CEMENT HV SMART SET (Cement) ×4 IMPLANT
CLOTH BEACON ORANGE TIMEOUT ST (SAFETY) ×2 IMPLANT
COOLER CRYO CUFF IC AND MOTOR (MISCELLANEOUS) ×2 IMPLANT
COVER LIGHT HANDLE STERIS (MISCELLANEOUS) ×4 IMPLANT
CUFF CRYO KNEE18X23 MED (MISCELLANEOUS) ×1 IMPLANT
CUFF TOURNIQUET SINGLE 34IN LL (TOURNIQUET CUFF) ×1 IMPLANT
DECANTER SPIKE VIAL GLASS SM (MISCELLANEOUS) ×2 IMPLANT
DRAPE BACK TABLE (DRAPES) ×2 IMPLANT
DRAPE EXTREMITY T 121X128X90 (DRAPE) ×2 IMPLANT
DRAPE PROXIMA HALF (DRAPES) ×1 IMPLANT
DRESSING AQUACEL AG ADV 3.5X12 (MISCELLANEOUS) ×1 IMPLANT
DRSG AQUACEL AG ADV 3.5X12 (MISCELLANEOUS) ×2
DRSG MEPILEX BORDER 4X12 (GAUZE/BANDAGES/DRESSINGS) ×2 IMPLANT
DURAPREP 26ML APPLICATOR (WOUND CARE) ×4 IMPLANT
ELECT REM PT RETURN 9FT ADLT (ELECTROSURGICAL) ×2
ELECTRODE REM PT RTRN 9FT ADLT (ELECTROSURGICAL) ×1 IMPLANT
EVACUATOR 3/16  PVC DRAIN (DRAIN) ×1
EVACUATOR 3/16 PVC DRAIN (DRAIN) ×1 IMPLANT
GLOVE BIO SURGEON STRL SZ7 (GLOVE) ×5 IMPLANT
GLOVE BIOGEL PI IND STRL 7.0 (GLOVE) ×2 IMPLANT
GLOVE BIOGEL PI INDICATOR 7.0 (GLOVE) ×4
GLOVE SKINSENSE NS SZ8.0 LF (GLOVE) ×2
GLOVE SKINSENSE STRL SZ8.0 LF (GLOVE) ×2 IMPLANT
GLOVE SS N UNI LF 8.5 STRL (GLOVE) ×2 IMPLANT
GOWN STRL REUS W/ TWL LRG LVL3 (GOWN DISPOSABLE) ×1 IMPLANT
GOWN STRL REUS W/TWL LRG LVL3 (GOWN DISPOSABLE) ×6 IMPLANT
GOWN STRL REUS W/TWL XL LVL3 (GOWN DISPOSABLE) ×2 IMPLANT
HANDPIECE INTERPULSE COAX TIP (DISPOSABLE) ×2
HOOD W/PEELAWAY (MISCELLANEOUS) ×8 IMPLANT
INST SET MAJOR BONE (KITS) ×2 IMPLANT
IV NS IRRIG 3000ML ARTHROMATIC (IV SOLUTION) ×2 IMPLANT
KIT BLADEGUARD II DBL (SET/KITS/TRAYS/PACK) ×2 IMPLANT
KIT ROOM TURNOVER APOR (KITS) ×2 IMPLANT
MANIFOLD NEPTUNE II (INSTRUMENTS) ×2 IMPLANT
MARKER SKIN DUAL TIP RULER LAB (MISCELLANEOUS) ×2 IMPLANT
NDL HYPO 21X1.5 SAFETY (NEEDLE) ×1 IMPLANT
NEEDLE HYPO 21X1.5 SAFETY (NEEDLE) ×2 IMPLANT
NS IRRIG 1000ML POUR BTL (IV SOLUTION) ×2 IMPLANT
PACK TOTAL JOINT (CUSTOM PROCEDURE TRAY) ×2 IMPLANT
PAD ARMBOARD 7.5X6 YLW CONV (MISCELLANEOUS) ×2 IMPLANT
PAD DANNIFLEX CPM (ORTHOPEDIC SUPPLIES) ×2 IMPLANT
PIN TROCAR 3 INCH (PIN) IMPLANT
SAW OSC TIP CART 19.5X105X1.3 (SAW) ×2 IMPLANT
SET BASIN LINEN APH (SET/KITS/TRAYS/PACK) ×2 IMPLANT
SET HNDPC FAN SPRY TIP SCT (DISPOSABLE) ×1 IMPLANT
STAPLER VISISTAT 35W (STAPLE) ×2 IMPLANT
SUT BRALON NAB BRD #1 30IN (SUTURE) ×4 IMPLANT
SUT MNCRL 0 VIOLET CTX 36 (SUTURE) ×1 IMPLANT
SUT MON AB 0 CT1 (SUTURE) ×2 IMPLANT
SUT MON AB 2-0 CT1 36 (SUTURE) IMPLANT
SUT MONOCRYL 0 CTX 36 (SUTURE) ×1
SYR 20CC LL (SYRINGE) ×3 IMPLANT
SYR 30ML LL (SYRINGE) ×2 IMPLANT
SYR BULB IRRIGATION 50ML (SYRINGE) ×2 IMPLANT
TOWEL OR 17X26 4PK STRL BLUE (TOWEL DISPOSABLE) ×2 IMPLANT
TOWER CARTRIDGE SMART MIX (DISPOSABLE) ×2 IMPLANT
TRAY FOLEY W/METER SILVER 16FR (SET/KITS/TRAYS/PACK) ×2 IMPLANT
WATER STERILE IRR 1000ML POUR (IV SOLUTION) ×4 IMPLANT
YANKAUER SUCT 12FT TUBE ARGYLE (SUCTIONS) ×2 IMPLANT

## 2017-02-15 NOTE — Plan of Care (Signed)
  Acute Rehab PT Goals(only PT should resolve) Pt Will Go Supine/Side To Sit 02/15/2017 1548 - Progressing by Lonell Grandchild, PT Flowsheets Taken 02/15/2017 1548  Pt will go Supine/Side to Sit with modified independence Patient Will Transfer Sit To/From Stand 02/15/2017 1548 - Progressing by Lonell Grandchild, PT Flowsheets Taken 02/15/2017 1548  Patient will transfer sit to/from stand with modified independence Pt Will Transfer Bed To Chair/Chair To Bed 02/15/2017 1548 - Progressing by Lonell Grandchild, PT Flowsheets Taken 02/15/2017 1548  Pt will Transfer Bed to Chair/Chair to Bed with modified independence Pt Will Ambulate 02/15/2017 1548 - Progressing by Lonell Grandchild, PT Flowsheets Taken 02/15/2017 1548  Pt will Ambulate with modified independence;> 125 feet;with rolling walker Pt Will Go Up/Down Stairs 02/15/2017 1548 - Progressing by Lonell Grandchild, PT Flowsheets Taken 02/15/2017 1548  Pt will Go Up / Down Stairs 3-5 stairs;with supervision;with rail(s)  3:49 PM, 02/15/17 Lonell Grandchild, MPT Physical Therapist with [facility] [phone] office [phone] mobile phone

## 2017-02-15 NOTE — Progress Notes (Signed)
Paged Dr. Aline Brochure because pt's heart rate was NSR to Afib and Aflutter at 150. Blood pressure was 194/114. Dr. Aline Brochure ordered 12.5mg  Metoprolol BID to be started tonight and a cardiology consult for tomorrow.

## 2017-02-15 NOTE — Anesthesia Postprocedure Evaluation (Signed)
Anesthesia Post Note  Patient: Christopher [name]  Procedure(s) Performed: TOTAL KNEE ARTHROPLASTY (Right Knee)  Patient location during evaluation: PACU Anesthesia Type: General Level of consciousness: awake, oriented and patient cooperative Pain management: pain level controlled Vital Signs Assessment: post-procedure vital signs reviewed and stable Respiratory status: spontaneous breathing and patient connected to face mask oxygen Cardiovascular status: stable Postop Assessment: no apparent nausea or vomiting Anesthetic complications: no     Last Vitals:  Vitals:   02/15/17 0725 02/15/17 1000  BP: 132/81 (!) 158/83  Pulse:  (!) 59  Resp: 18 10  Temp:  (P) 36.6 C  SpO2: 100% 100%    Last Pain:  Vitals:   02/15/17 0637  TempSrc: Oral                 ADAMS, AMY A

## 2017-02-15 NOTE — Anesthesia Procedure Notes (Signed)
Procedure Name: Intubation Date/Time: 02/15/2017 7:56 AM Performed by: Andree Elk, Rolfe Hartsell A, CRNA Pre-anesthesia Checklist: Patient identified, Patient being monitored, Timeout performed, Emergency Drugs available and Suction available Patient Re-evaluated:Patient Re-evaluated prior to induction Oxygen Delivery Method: Circle System Utilized Preoxygenation: Pre-oxygenation with 100% oxygen Induction Type: IV induction Ventilation: Mask ventilation without difficulty Laryngoscope Size: Miller and 3 Grade View: Grade I Tube type: Oral Tube size: 7.0 mm Number of attempts: 1 Airway Equipment and Method: Stylet Placement Confirmation: ETT inserted through vocal cords under direct vision,  positive ETCO2 and breath sounds checked- equal and bilateral Secured at: 21 cm Tube secured with: Tape Dental Injury: Teeth and Oropharynx as per pre-operative assessment

## 2017-02-15 NOTE — Anesthesia Preprocedure Evaluation (Signed)
Anesthesia Evaluation  Patient identified by MRN, date of birth, ID band Patient awake    Reviewed: Allergy & Precautions, NPO status , Patient's Chart, lab work & pertinent test results  Airway Mallampati: II  TM Distance: >3 FB     Dental  (+) Teeth Intact   Pulmonary neg pulmonary ROS,    breath sounds clear to auscultation       Cardiovascular hypertension, Pt. on medications  Rhythm:Regular Rate:Bradycardia     Neuro/Psych negative neurological ROS  negative psych ROS   GI/Hepatic negative GI ROS, Neg liver ROS,   Endo/Other  negative endocrine ROS  Renal/GU negative Renal ROS     Musculoskeletal  (+) Arthritis , Osteoarthritis,    Abdominal   Peds  Hematology negative hematology ROS (+)   Anesthesia Other Findings   Reproductive/Obstetrics                             Anesthesia Physical Anesthesia Plan  ASA: II  Anesthesia Plan: Spinal   Post-op Pain Management:    Induction:   PONV Risk Score and Plan:   Airway Management Planned: Simple Face Mask  Additional Equipment:   Intra-op Plan:   Post-operative Plan:   Informed Consent: I have reviewed the patients History and Physical, chart, labs and discussed the procedure including the risks, benefits and alternatives for the proposed anesthesia with the patient or authorized representative who has indicated his/her understanding and acceptance.     Plan Discussed with:   Anesthesia Plan Comments:         Anesthesia Quick Evaluation

## 2017-02-15 NOTE — Anesthesia Procedure Notes (Signed)
Procedure Name: MAC Date/Time: 02/15/2017 7:27 AM Performed by: Andree Elk Amy A, CRNA Pre-anesthesia Checklist: Patient identified, Timeout performed, Emergency Drugs available, Suction available and Patient being monitored Oxygen Delivery Method: Simple face mask

## 2017-02-15 NOTE — Evaluation (Signed)
Physical Therapy Evaluation Patient Details Name: Christopher [name] MRN: [id number] DOB: 07-Jun-1960 Today's Date: 02/15/2017  RIGHT KNEE PROM:  8-65 degrees CPM ROM: 0-70 degrees AMBULATION DISTANCE: 60 feet    History of Present Illness  Christopher [name] is a 57 y/o male s/p Right TKA 02/15/17  with the diagnosis of primary osteoarthritis right knee  Clinical Impression  Patient demonstrates slightly labored movement for sitting up and during gait training in hallway without loss of balance, tolerated transferring to chair and instructed in HEP to include self stretching to right knee using LLE with good return demonstrated.  Patient put back to bed later and tolerated CPM set up at 0-70 degrees with right knee pain at baseline.  Patient will benefit from continued physical therapy in hospital and recommended venue below to increase strength, balance, endurance for safe ADLs and gait.    Follow Up Recommendations Home health PT;Supervision for mobility/OOB    Equipment Recommendations  Rolling walker with 5" wheels    Recommendations for Other Services       Precautions / Restrictions Precautions Precautions: Fall Precaution Comments: no pillows underneath right knee Restrictions Weight Bearing Restrictions: Yes RLE Weight Bearing: Weight bearing as tolerated      Mobility  Bed Mobility Overal bed mobility: Needs Assistance Bed Mobility: Supine to Sit;Sit to Supine     Supine to sit: Min guard Sit to supine: Min guard      Transfers Overall transfer level: Needs assistance Equipment used: Rolling walker (2 wheeled) Transfers: Sit to/from [facility] Sit to Stand: Min guard Stand pivot transfers: Min guard          Ambulation/Gait Ambulation/Gait assistance: Min guard Ambulation Distance (Feet): 60 Feet Assistive device: Rolling walker (2 wheeled) Gait Pattern/deviations: Decreased step length - right;Decreased stance time - right;Decreased  stride length   Gait velocity interpretation: Below normal speed for age/gender General Gait Details: demonstrates slow labored cadence with fair return for right heel to toe stepping, limited secondary to c/o fatigue and increasing right knee pain  Stairs            Wheelchair Mobility    Modified Rankin (Stroke Patients Only)       Balance Overall balance assessment: Needs assistance Sitting-balance support: No upper extremity supported;Feet supported Sitting balance-Leahy Scale: Good     Standing balance support: Bilateral upper extremity supported;During functional activity Standing balance-Leahy Scale: Fair                               Pertinent Vitals/Pain Pain Assessment: 0-10 Pain Score: 8  Pain Location: right knee Pain Descriptors / Indicators: Aching;Sharp;Grimacing Pain Intervention(s): Limited activity within patient's tolerance;Monitored during session;Premedicated before session    [place]ane expects to be discharged to:: Private residence Living Arrangements: Spouse/significant other Available Help at Discharge: Family Type of Home: House Home Access: Stairs to enter Entrance Stairs-Rails: None Technical brewer of Steps: 1 Home Layout: Multi-level Home Equipment: Cane - single point;Crutches      Prior Function Level of Independence: Independent               Hand Dominance        Extremity/Trunk Assessment   Upper Extremity Assessment Upper Extremity Assessment: Overall WFL for tasks assessed    Lower Extremity Assessment Lower Extremity Assessment: Generalized weakness;RLE deficits/detail;LLE deficits/detail RLE Deficits / Details: grossly 4/5 except right knee grossly -3/5 LLE Deficits / Details: grossly 5/5  Communication   Communication: No difficulties  Cognition Arousal/Alertness: Awake/alert Behavior During Therapy: WFL for tasks assessed/performed Overall Cognitive Status:  Within Functional Limits for tasks assessed                                        General Comments      Exercises Total Joint Exercises Ankle Circles/Pumps: Supine;AROM;Strengthening;Right;5 reps Quad Sets: Supine;AROM;Strengthening;Right;5 reps Short Arc Quad: Supine;AAROM;Strengthening;Right;5 reps Heel Slides: Supine;AAROM;Strengthening;Right;5 reps Goniometric ROM: RIGHT KNEE: 8-65 degrees   Assessment/Plan    PT Assessment Patient needs continued PT services  PT Problem List Decreased strength;Decreased range of motion;Decreased activity tolerance;Decreased balance;Decreased mobility       PT Treatment Interventions Gait training;Stair training;Functional mobility training;Therapeutic activities;Therapeutic exercise;Patient/family education    PT Goals (Current goals can be found in the Care Plan section)  Acute Rehab PT Goals Patient Stated Goal: return home PT Goal Formulation: With patient Time For Goal Achievement: 02/18/17 Potential to Achieve Goals: Good    Frequency 7X/week   Barriers to discharge        Co-evaluation               AM-PAC PT "6 Clicks" Daily Activity  Outcome Measure Difficulty turning over in bed (including adjusting bedclothes, sheets and blankets)?: A Little Difficulty moving from lying on back to sitting on the side of the bed? : A Little Difficulty sitting down on and standing up from a chair with arms (e.g., wheelchair, bedside commode, etc,.)?: A Little Help needed moving to and from a bed to chair (including a wheelchair)?: A Little Help needed walking in hospital room?: A Little Help needed climbing 3-5 steps with a railing? : A Lot 6 Click Score: 17    End of Session   Activity Tolerance: Patient tolerated treatment well;Patient limited by fatigue;Patient limited by pain Patient left: in bed;with call bell/phone within reach Nurse Communication: Mobility status PT Visit Diagnosis: Unsteadiness on feet  (R26.81);Muscle weakness (generalized) (M62.81)    Time: 2130-8657 PT Time Calculation (min) (ACUTE ONLY): 39 min   Charges:   PT Evaluation $PT Eval Moderate Complexity: 1 Mod PT Treatments $Gait Training: 8-22 mins $Therapeutic Activity: 8-22 mins   PT G Codes:        3:47 PM, 02/23/17 Lonell Grandchild, MPT Physical Therapist with [facility] [phone] office [phone] mobile phone

## 2017-02-15 NOTE — Brief Op Note (Signed)
02/15/2017  9:48 AM  PATIENT:  Christopher [name]  57 y.o. male  PRE-OPERATIVE DIAGNOSIS:  primary osteoarthritis right knee  POST-OPERATIVE DIAGNOSIS:  primary osteoarthritis right knee  PROCEDURE:  Procedure(s): TOTAL KNEE ARTHROPLASTY (Right)   Operative findings: Extreme wear medial compartment moderate varus deformity intra-articular cartilage on the medial side was completely denuded lateral meniscus ACL PCL were intact mild synovial hypertrophy  Implants: DEPUY Sigma fixed bearing posterior stabilized size 4 femur size 4 tibia size 10 posterior stabilized polyethylene size 38 x 9 polyethylene patella  Anesthesia was by general after attempted spinal did not work  Assisted by Simonne Maffucci and Dumas ran    SURGEON:  Surgeon(s) and Role:    * Carole Civil, MD - Primary  No blood was administered  1 Hemovac drain was placed  30 cc of half percent Sensorcaine with epinephrine was injected in the periarticular structures as well as 20 cc of EXPAREL diluted with 40 cc of saline   EBL:  25 mL   No specimens  Counts were correct    PATIENT DISPOSITION:  PACU - hemodynamically stable.   Delay start of Pharmacological VTE agent (>24hrs) due to surgical blood loss or risk of bleeding: no

## 2017-02-15 NOTE — Transfer of Care (Signed)
Immediate Anesthesia Transfer of Care Note  Patient: Christopher [name]  Procedure(s) Performed: TOTAL KNEE ARTHROPLASTY (Right Knee)  Patient Location: PACU  Anesthesia Type:General  Level of Consciousness: awake, oriented and patient cooperative  Airway & Oxygen Therapy: Patient Spontanous Breathing and Patient connected to face mask oxygen  Post-op Assessment: Report given to RN and Post -op Vital signs reviewed and stable  Post vital signs: Reviewed and stable  Last Vitals:  Vitals:   02/15/17 0720 02/15/17 0725  BP: 135/83 132/81  Pulse:    Resp: 14 18  Temp:    SpO2: 99% 100%    Last Pain:  Vitals:   02/15/17 0637  TempSrc: Oral      Patients Stated Pain Goal: 9 (45/40/98 1191)  Complications: No apparent anesthesia complications

## 2017-02-15 NOTE — Op Note (Signed)
02/15/2017  9:48 AM  PATIENT:  Christopher [name]  57 y.o. male  PRE-OPERATIVE DIAGNOSIS:  primary osteoarthritis right knee  POST-OPERATIVE DIAGNOSIS:  primary osteoarthritis right knee  PROCEDURE:  Procedure(s): TOTAL KNEE ARTHROPLASTY (Right) - [id number]  Operative findings: Extreme wear medial compartment moderate varus deformity intra-articular cartilage on the medial side was completely denuded lateral meniscus ACL PCL were intact mild synovial hypertrophy  Implants: DEPUY Sigma fixed bearing posterior stabilized size 4 femur size 4 tibia size 10 posterior stabilized polyethylene size 38 x 9 polyethylene patella  Anesthesia was by general after attempted spinal did not work  Assisted by Simonne Maffucci and [facility]  Surgeon Aline Brochure [phone]  SURGICAL DICTATION    The patient was identified in the preop holding area and the surgical site was confirmed as the right knee. Chart review and update were completed. The patient was taken to the operating room for spinal anesthesia. After successful spinal anesthesia Foley catheter was inserted. The patient was placed supine on the operating table.   The right leg was prepped with DuraPrep and draped sterilely. Timeout was completed. The limb was then exsanguinated a  6 inch Esmarch. The tourniquet was elevated to 300 mmHg.   A midline incision was made and taken down to the extensor mechanism followed by medial arthrotomy. The patella was everted. A synovectomy was performed as needed. The osteophytes were resected.  Anterior cruciate ligament and PCL and medial and lateral meniscus were resected.   a 3/8 inch drill bit was used to enter the femoral canal which was suctioned and irrigated until the fluid was clear. The distal femoral cut was set for 12 millimeter resection with a 5  Right Valgus angle. This cut was completed and checked for flatness.   the femur was then measured to a size 4.  The cutting block was placed to match the  epicondyles and the 4 distal cuts were made.   the tibia was subluxated forward and the external alignment guide was placed. We removed 10 mm of bone from the higher  LATERAL  side. We set the guide for neutral varus valgus cut related to the  Mechanical axis of the tibia and for slope matching the patient's anatomy. Rotational alignment was set using the tibial tubercle, tibial spine and second metatarsal. The cutting block was pinned and the proximal tibia was resected.    spacer blocks were placed starting with a 10 mm insert to confirm equal flexion-extension gaps. A size  10  mm insert balanced the gaps.   We placed the femoral notch cutting guide size 4  and resected the notch.   Trial implants were placed using appropriate size femur , appropriate size tibial baseplate which was measured after the proximal tibia resection. Tibial rotation was set patella tracking was normal   The tibia was then punched per manufacture technique making sure to avoid internal rotation.   The patella measured a size 25   We resected down to a size 15 using a size 38 X 9 button.   Final range of motion check was performed with the appropriate size trials as mentioned above. Satisfactory reduction and motion were obtained.   Trial implants were removed. The bone was irrigated and dried and the cement was mixed on the back table  exparel was injected in the soft tissues and posterior capsule of the knee  These implants were then cemented in place. Excess cement was removed. The cement was allowed to cure. Second irrigation was  performed.    FInal range of motion check and stability check was completed  The wound was irrigated third time Hemovac drain was placed, extensor mechanism was closed with #1 Nurolon followed by 0 Monocryl and staples to reapproximate the skin edges and subcutaneous tissue.   Sterile dressing and cryocuff  was applied  The patient was taken recovery in stable  condition    SURGEON:  Surgeon(s) and Role:    Aline Brochure, Tim Lair, MD - Primary  No blood was administered  1 Hemovac drain was placed  30 cc of half percent Sensorcaine with epinephrine was injected in the periarticular structures as well as 20 cc of EXPAREL diluted with 40 cc of saline   EBL:  25 mL   No specimens  Counts were correct    PATIENT DISPOSITION:  PACU - hemodynamically stable.   Delay start of Pharmacological VTE agent (>24hrs) due to surgical blood loss or risk of bleeding: no

## 2017-02-15 NOTE — Interval H&P Note (Signed)
History and Physical Interval Note:  02/15/2017 7:24 AM  Christopher [name]  has presented today for surgery, with the diagnosis of primary osteoarthritis right knee  The various methods of treatment have been discussed with the patient and family. After consideration of risks, benefits and other options for treatment, the patient has consented to  Procedure(s): TOTAL KNEE ARTHROPLASTY (Right) as a surgical intervention .  The patient's history has been reviewed, patient examined, no change in status, stable for surgery.  I have reviewed the patient's chart and labs.  Questions were answered to the patient's satisfaction.    CBC Latest Ref Rng & Units 02/09/2017 06/07/2016 06/06/2016  WBC 4.0 - 10.5 K/uL 4.3 5.5 5.3  Hemoglobin 13.0 - 17.0 g/dL 15.1 14.4 15.2  Hematocrit 39.0 - 52.0 % 44.2 42.3 43.6  Platelets 150 - 400 K/uL 217 192 203   BMP Latest Ref Rng & Units 02/09/2017 06/12/2016 06/07/2016  Glucose 65 - 99 mg/dL 138(H) 106(H) 106(H)  BUN 6 - 20 mg/dL 17 15 15   Creatinine 0.61 - 1.24 mg/dL 1.11 1.09 1.19  BUN/Creat Ratio 9 - 20 - 14 -  Sodium 135 - 145 mmol/L 142 143 141  Potassium 3.5 - 5.1 mmol/L 3.6 4.5 3.8  Chloride 101 - 111 mmol/L 104 104 104  CO2 22 - 32 mmol/L 27 28 31   Calcium 8.9 - 10.3 mg/dL 9.3 9.3 9.0    BP (!) 148/103   Pulse 68   Temp 97.8 F (36.6 C) (Oral)   Resp 14   Ht 5\' 11"  (1.803 m)   Wt 188 lb (85.3 kg)   SpO2 97%   BMI 26.22 kg/m   [name]

## 2017-02-16 ENCOUNTER — Encounter (HOSPITAL_COMMUNITY): Payer: Self-pay | Admitting: Orthopedic Surgery

## 2017-02-16 ENCOUNTER — Inpatient Hospital Stay (HOSPITAL_COMMUNITY): Payer: BLUE CROSS/BLUE SHIELD

## 2017-02-16 DIAGNOSIS — Z96651 Presence of right artificial knee joint: Secondary | ICD-10-CM

## 2017-02-16 DIAGNOSIS — I4891 Unspecified atrial fibrillation: Secondary | ICD-10-CM

## 2017-02-16 DIAGNOSIS — E7849 Other hyperlipidemia: Secondary | ICD-10-CM

## 2017-02-16 DIAGNOSIS — I10 Essential (primary) hypertension: Secondary | ICD-10-CM

## 2017-02-16 LAB — CBC
HEMATOCRIT: 35.8 % — AB (ref 39.0–52.0)
HEMOGLOBIN: 12.1 g/dL — AB (ref 13.0–17.0)
MCH: 29.8 pg (ref 26.0–34.0)
MCHC: 33.8 g/dL (ref 30.0–36.0)
MCV: 88.2 fL (ref 78.0–100.0)
Platelets: 200 10*3/uL (ref 150–400)
RBC: 4.06 MIL/uL — AB (ref 4.22–5.81)
RDW: 12.4 % (ref 11.5–15.5)
WBC: 9.5 10*3/uL (ref 4.0–10.5)

## 2017-02-16 LAB — ECHOCARDIOGRAM COMPLETE
AVLVOTPG: 6 mmHg
E decel time: 204 msec
EERAT: 9.37
FS: 47 % — AB (ref 28–44)
Height: 71 in
IV/PV OW: 0.98
LA ID, A-P, ES: 29 mm
LA vol A4C: 56 ml
LADIAMINDEX: 1.4 cm/m2
LAVOL: 50.9 mL
LAVOLIN: 24.5 mL/m2
LEFT ATRIUM END SYS DIAM: 29 mm
LV SIMPSON'S DISK: 73
LV dias vol index: 37 mL/m2
LV e' LATERAL: 11.1 cm/s
LVDIAVOL: 77 mL (ref 62–150)
LVEEAVG: 9.37
LVEEMED: 9.37
LVOT VTI: 24.6 cm
LVOT area: 2.54 cm2
LVOT diameter: 18 mm
LVOT peak vel: 121 cm/s
LVOTSV: 62 mL
LVSYSVOL: 21 mL
LVSYSVOLIN: 10 mL/m2
Lateral S' vel: 13.2 cm/s
MV Dec: 204
MVPG: 4 mmHg
MVPKAVEL: 74.4 m/s
MVPKEVEL: 104 m/s
PW: 11.2 mm — AB (ref 0.6–1.1)
Stroke v: 56 ml
TAPSE: 29 mm
TDI e' lateral: 11.1
TDI e' medial: 9.68
Weight: 3008 oz

## 2017-02-16 LAB — BASIC METABOLIC PANEL
Anion gap: 9 (ref 5–15)
BUN: 11 mg/dL (ref 6–20)
CALCIUM: 8.1 mg/dL — AB (ref 8.9–10.3)
CHLORIDE: 103 mmol/L (ref 101–111)
CO2: 26 mmol/L (ref 22–32)
Creatinine, Ser: 1.15 mg/dL (ref 0.61–1.24)
GFR calc non Af Amer: >60 mL/min (ref >60)
GLUCOSE: 115 mg/dL — AB (ref 65–99)
POTASSIUM: 3.5 mmol/L (ref 3.5–5.1)
Sodium: 138 mmol/L (ref 135–145)

## 2017-02-16 MED ORDER — METOPROLOL TARTRATE 50 MG PO TABS
50.0000 mg | ORAL_TABLET | Freq: Two times a day (BID) | ORAL | Status: DC
  Administered 2017-02-16 – 2017-02-17 (×3): 50 mg via ORAL
  Filled 2017-02-16 (×3): qty 1

## 2017-02-16 NOTE — Consult Note (Signed)
Cardiology Consultation:   Patient ID: Christopher [name]; [id number]; Dec 27, 1960   Admit date: 02/15/2017 Date of Consult: 02/16/2017  Primary Care Provider: Kathyrn Drown, MD Primary Cardiologist: Christopher Dolly, MD  Primary Electrophysiologist:  NA   Patient Profile:   Christopher [name] is a 57 y.o. male with a hx of chest pain and normal coronary CT 06/2016, hypertension and HLD who is being seen today for the evaluation of A. fib at the request of Dr. Aline [name].  History of Present Illness:   Christopher [name] is a patient of Christopher [name] with history of hypertension and HLD who has history of chest pain who had a normal GXT in 2015 and normal coronary CT 06/2016 with calcium score of 0.  2D echo in 2015 LVEF 55-60% left atrium was mildly dilated, mild LVH.  Patient underwent right knee replacement yesterday and postop went into rapid atrial fibrillation and had elevated blood pressure of 194/114 treated with metoprolol 12.5 mg twice daily.  CHA2DS2-VASc equals 1 for hypertension.  Patient still going in/out Afib with rates up to 170 when he's in pain or moving around. Totally asymptomatic.Denies chest pain, dyspnea, dizziness or palpitations. Takes amlodipine for HTN as outpatient.   Past Medical History:  Diagnosis Date  . Arthritis   . HTN (hypertension)   . Hyperlipemia     Past Surgical History:  Procedure Laterality Date  . HERNIA REPAIR Right   . KNEE ARTHROSCOPY  right knee     Home Medications:  Prior to Admission medications   Medication Sig Start Date End Date Taking? Authorizing Provider  amLODipine (NORVASC) 5 MG tablet Take 1 tablet (5 mg total) by mouth daily. 09/23/16  Yes Christopher Drown, MD  aspirin EC 81 MG tablet Take 81 mg by mouth daily.   Yes [provider]  atorvastatin (LIPITOR) 10 MG tablet TAKE 1 TABLET BY MOUTH DAILY 06/11/16  Yes [name], [name], MD  diclofenac (VOLTAREN) 75 MG EC tablet Take 1 tablet (75 mg total) by mouth 2 (two) times  daily. Patient not taking: Reported on 01/31/2017 09/23/16   Christopher Drown, MD  metoprolol tartrate (LOPRESSOR) 25 MG tablet Take 0.5 tablets (12.5 mg total) by mouth 2 (two) times daily. Patient not taking: Reported on 01/31/2017 09/27/16   Christopher Drown, MD  oxyCODONE-acetaminophen (PERCOCET) 10-325 MG tablet Take 1 tablet by mouth every 6 (six) hours as needed for pain. Patient not taking: Reported on 01/31/2017 01/06/17   Christopher Drown, MD    Inpatient Medications: Scheduled Meds: . amLODipine  5 mg Oral Daily  . aspirin  81 mg Oral BID  . atorvastatin  10 mg Oral Daily  . celecoxib  200 mg Oral Q12H  . dexamethasone  10 mg Intravenous Once  . docusate sodium  100 mg Oral BID  . gabapentin  300 mg Oral TID  . HYDROcodone-acetaminophen  1 tablet Oral Q6H  . ketorolac  15 mg Intravenous Q6H  . metoprolol tartrate  12.5 mg Oral BID   Continuous Infusions: . sodium chloride 100 mL/hr at 02/15/17 1407  . methocarbamol (ROBAXIN)  IV     PRN Meds: acetaminophen **OR** acetaminophen, alum & mag hydroxide-simeth, diphenhydrAMINE, HYDROcodone-acetaminophen, HYDROmorphone (DILAUDID) injection, magnesium citrate, menthol-cetylpyridinium **OR** phenol, methocarbamol **OR** methocarbamol (ROBAXIN)  IV, metoCLOPramide **OR** metoCLOPramide (REGLAN) injection, ondansetron **OR** ondansetron (ZOFRAN) IV, oxyCODONE, polyethylene glycol, sorbitol  Allergies:    Allergies  Allergen Reactions  . Hydrocodone Itching    Social History:   Social History  Socioeconomic History  . Marital status: Married    Spouse name: Not on file  . Number of children: Not on file  . Years of education: 75  . Highest education level: Not on file  Social Needs  . Financial resource strain: Not on file  . Food insecurity - worry: Not on file  . Food insecurity - inability: Not on file  . Transportation needs - medical: Not on file  . Transportation needs - non-medical: Not on file  Occupational History   . Occupation: truck Education administrator: Painter  Tobacco Use  . Smoking status: Never Smoker  . Smokeless tobacco: Never Used  Substance and Sexual Activity  . Alcohol use: Yes    Comment: occasional beer  . Drug use: No  . Sexual activity: Yes    Birth control/protection: None  Other Topics Concern  . Not on file  Social History Narrative  . Not on file    Family History:    Family History  Problem Relation Age of Onset  . Arthritis Unknown   . Cancer Unknown   . Diabetes Unknown   . Other Mother 62       sepsis  . CAD Father 39  . CAD Brother 64  . Diabetes Brother      ROS:  Please see the history of present illness.  Review of Systems  Constitution: Negative.  HENT: Negative.   Cardiovascular: Negative.   Respiratory: Negative.   Endocrine: Negative.   Hematologic/Lymphatic: Negative.   Musculoskeletal: Positive for arthritis and joint pain.  Gastrointestinal: Negative.   Genitourinary: Negative.   Neurological: Negative.     All other ROS reviewed and negative.     Physical Exam/Data:   Vitals:   02/15/17 1615 02/15/17 2000 02/16/17 0000 02/16/17 0400  BP:  (!) 165/82 (!) 152/81 120/64  Pulse:  76 69 63  Resp:  18 18 18   Temp:  99.4 F (37.4 C) 99.2 F (37.3 C) 99.3 F (37.4 C)  TempSrc:  Oral Oral Oral  SpO2: 99% 100% 97% 98%  Weight:      Height:        Intake/Output Summary (Last 24 hours) at 02/16/2017 0752 Last data filed at 02/16/2017 0600 Gross per 24 hour  Intake 3488.33 ml  Output 1475 ml  Net 2013.33 ml   Filed Weights   02/15/17 0637  Weight: 188 lb (85.3 kg)   Body mass index is 26.22 kg/m.  General:  Well nourished, well developed, in no acute distress  HEENT: normal Lymph: no adenopathy Neck: increased JVD Endocrine:  No thryomegaly Vascular: No carotid bruits; FA pulses 2+ bilaterally without bruits  Cardiac:  normal S1, S2; RRR; some skipping,no murmur  Lungs:  clear to auscultation bilaterally, no  wheezing, rhonchi or rales  Abd: soft, nontender, no hepatomegaly  Ext: no edema Musculoskeletal: right knee replacement, brace on Skin: warm and dry  Neuro:  CNs 2-12 intact, no focal abnormalities noted Psych:  Normal affect   EKG:  The EKG was personally reviewed and demonstrates: EKG not done, will order Telemetry:  Telemetry was personally reviewed and demonstrates: NSR with some Atrial fib at 170/m  Relevant CV Studies: Coronary CT 5/2018FINDINGS: Non-cardiac: See separate report from [facility]. No significant findings on limited lung and soft tissue windows.   Calcium Score:  0   Coronary Arteries: Right dominant with no anomalies   LM:  Normal   LAD:  Normal   D1: Normal  D2: Normal   Circumflex: Normal   OM1:  Normal   AV groove normal   RCA:  Normal   PDA: Normal   PLA:  Normal   IMPRESSION: Normal right dominant coronary arteries   Calcium Score of 0   Normal aortic root 2.8 cm   No proximal pulmonary emboli   Jenkins Rouge 05/23/13 GXT Attending Note  Patient exercised according to the Bruce protocol for 12 minutes achieving a workload of 13.4 METs. Heart increased from 47 bpm up to 150 bpm (88% of THR). Blood pressure increased from 146/80 up to 220/85. The test was stopped due to fatigue, the patient did not experience any chest pain.  Baseline EKG showed NSR. There were no ischemic EKG changes and no significant arrhythmias during stress.  Conclusions  1. Negative exercise stress test for ischemia  2. Duke treadmill score of 12, consistent with low risk for major cardiac events  3. Excellent exercise functional capacity (140% of predicted based on age and gender)   05/2013 Carotid US IMPRESSION: 1. Extensive bilateral intimal thickening but no focal plaque accumulation or stenosis.   05/2013 Echo Study Conclusions  - Left ventricle: The cavity size was normal. Wall thickness was increased in a pattern of mild LVH. Systolic  function was normal. The estimated ejection fraction was in the range of 55% to 60%. Wall motion was normal; there were no regional wall motion abnormalities. Left ventricular diastolic function parameters were normal. - Mitral valve: Trivial regurgitation. - Right ventricle: The cavity size was mildly dilated. Systolic function was normal. - Right atrium: The atrium was mildly dilated. Central venous pressure: 66mm Hg (est). - Atrial septum: No defect or patent foramen ovale was identified. - Tricuspid valve: Trivial regurgitation. - Pulmonic valve: Mild regurgitation. - Pulmonary arteries: PA peak pressure: 82mm Hg (S). - Pericardium, extracardiac: There was no pericardial effusion. Impressions:  - Normal LV chanber size with mild LVH and LVEF 55-60%. Normal diastolic function. Mildly dilated RV with normal contraction, mild right atrial enlargement. No obvious PFO or ASD. Normal PASP 29 mmHg. Transthoracic echocardiography. M-mode, complete        Laboratory Data:  Chemistry Recent Labs  Lab 02/09/17 1021 02/16/17 0603  NA 142 138  K 3.6 3.5  CL 104 103  CO2 27 26  GLUCOSE 138* 115*  BUN 17 11  CREATININE 1.11 1.15  CALCIUM 9.3 8.1*  GFRNONAA >60 >60  GFRAA >60 >60  ANIONGAP 11 9    Recent Labs  Lab 02/09/17 1021  PROT 7.3  ALBUMIN 4.1  AST 20  ALT 24  ALKPHOS 86  BILITOT 0.8   Hematology Recent Labs  Lab 02/09/17 1021 02/16/17 0603  WBC 4.3 9.5  RBC 5.12 4.06*  HGB 15.1 12.1*  HCT 44.2 35.8*  MCV 86.3 88.2  MCH 29.5 29.8  MCHC 34.2 33.8  RDW 12.3 12.4  PLT 217 200   Cardiac EnzymesNo results for input(s): TROPONINI in the last 168 hours. No results for input(s): TROPIPOC in the last 168 hours.  BNPNo results for input(s): BNP, PROBNP in the last 168 hours.  DDimer No results for input(s): DDIMER in the last 168 hours.  Radiology/Studies:  Dg Knee Right Port  Result Date: 02/15/2017 CLINICAL DATA:  Right knee replacement EXAM: PORTABLE  RIGHT KNEE - 1-2 VIEW COMPARISON:  10/20/2016 FINDINGS: Postop right knee replacement. Prosthesis in satisfactory position alignment. Drain in the suprapatellar bursa. No fracture or immediate complication IMPRESSION: Satisfactory right knee replacement Electronically Signed  By: Franchot Gallo M.D.   On: 02/15/2017 10:32    Assessment and Plan:   1. A. fib with RVR in the setting of postop right knee replacement and hypertension, CHA2DS2-VASc equals 1 for hypertension. Patient going in/out of Afib. Will order EKG and echo. Increase metoprolol 50 mg BID. Shouldn't need long term anticoagulation 2. Hypertension still elevated 3. Hyperlipidemia on lipitor 4. Osteoarthritis status post right knee replacement   For questions or updates, please contact [facility] Please consult [email or web address] for contact info under Cardiology/STEMI.   Signed, Ermalinda Barrios, PA-C  02/16/2017 7:52 AM   The patient was seen and examined, and I agree with the history, physical exam, assessment and plan as documented above, with modifications as noted below. I have also personally reviewed all relevant documentation, old records, labs, and both radiographic and cardiovascular studies. I have also independently interpreted old and new ECG's.  Briefly, this is a 57 year old male who underwent right knee replacement yesterday.  He was noted to go from sinus rhythm into rapid atrial fibrillation postoperatively.  He was given metoprolol by the orthopedic surgeon.    He denies any current chest pain, palpitations, and shortness of breath.  He has a history of chest pain and was evaluated with a normal exercise treadmill stress test a few years ago and then underwent a normal coronary CT in May 2018.    I reviewed labs.  His potassium is low normal.    He has a history of hypertension for which he takes amlodipine.  He has hyperlipidemia and takes Lipitor.  His blood pressure is currently normal.  It had been 174/102  yesterday.  This patients CHA2DS2-VASc Score and unadjusted Ischemic Stroke Rate (% per year) is equal to 1.  Thus, systemic anticoagulation is not warranted at this time.    I personally reviewed his echocardiogram.  It demonstrates normal left ventricular systolic and diastolic function with normal left atrial size.    We will increase metoprolol for heart rate control.  His last episode of atrial fibrillation and atrial flutter appeared to occur at 8:13 AM.  [name] currently in sinus rhythm with PVCs.  We will make certain he has outpatient follow-up.    Kate Sable, MD, [facility]  02/16/2017 11:06 AM

## 2017-02-16 NOTE — Progress Notes (Signed)
OT Cancellation Note  Patient Details Name: Christopher [name] MRN: [id number] DOB: February 15, 1960   Cancelled Treatment:    Reason Eval/Treat Not Completed: OT screened, no needs identified, will sign off. Pt screened for OT needs, pt requiring assistance with LB ADLs due to pain in RLE s/p R TKA. Pt reports wife will be with him 24/7 to assist with ADLs, son will help out as well. No further OT services required at this time.   Guadelupe Sabin, OTR/L  [phone] 02/16/2017, 8:19 AM

## 2017-02-16 NOTE — Care Management Note (Signed)
Case Management Note  Patient Details  Name: Christopher [name] MRN: [id number] Date of Birth: [birth year]     S/P TKR. Pt is from home, lives with his wife. Will have assistance at DC. MD office has made referrals for [facility] and [place] pta. [employer] will be delivering CPM to pt's home tomorrow and [facility] will see pt for PT day after DC. CM has been in contact with Tim Justis, who will be updated on DC date. Pt will need RW and has chosen [facility] from list of DME providers. Blake Divine, [facility] rep, aware of referral and will pull pt info from chart and deliver DME to pt room prior to DC.  Anticiapte DC home tomorrow per MD noted today.              Expected Discharge Date:      02/17/2017            Expected Discharge Plan:  [place]  In-House Referral:  NA  Discharge planning Services  CM Consult  Post Acute Care Choice:  Durable Medical Equipment, Home Health Choice offered to:  Patient  DME Arranged:  [place] rolling DME Agency:  [place]:  PT [place]:  [facility] ([facility])  Status of Service:  Completed, signed off Sherald Barge, RN 02/16/2017, 11:10 AM

## 2017-02-16 NOTE — Progress Notes (Signed)
Patient ID: FRANKLIN BAUMBACH, male   DOB: 1960/08/28, 57 y.o.   MRN: [id number] POD1 RIGHT TKA  BP 120/64 (BP Location: Right Arm)   Pulse 63   Temp 99.3 F (37.4 C) (Oral)   Resp 18   Ht 5\' 11"  (1.803 m)   Wt 188 lb (85.3 kg)   SpO2 98%   BMI 26.22 kg/m    HEART RATE VARIABLE LAST EVENING   HE SAYS HE FEELS FINE   CBC Latest Ref Rng & Units 02/16/2017 02/09/2017 06/07/2016  WBC 4.0 - 10.5 K/uL 9.5 4.3 5.5  Hemoglobin 13.0 - 17.0 g/dL 12.1(L) 15.1 14.4  Hematocrit 39.0 - 52.0 % 35.8(L) 44.2 42.3  Platelets 150 - 400 K/uL 200 217 192   BMP Latest Ref Rng & Units 02/16/2017 02/09/2017 06/12/2016  Glucose 65 - 99 mg/dL 115(H) 138(H) 106(H)  BUN 6 - 20 mg/dL 11 17 15   Creatinine 0.61 - 1.24 mg/dL 1.15 1.11 1.09  BUN/Creat Ratio 9 - 20 - - 14  Sodium 135 - 145 mmol/L 138 142 143  Potassium 3.5 - 5.1 mmol/L 3.5 3.6 4.5  Chloride 101 - 111 mmol/L 103 104 104  CO2 22 - 32 mmol/L 26 27 28   Calcium 8.9 - 10.3 mg/dL 8.1(L) 9.3 9.3    CARDILOGY CONSULT CONTINUE TELEMETRY   I ADDED A SMALL DOSE OF METOPROLOL TO GET HR DOWN , AFIB NOTICED AS WELL ON STRIPS   PT OK TODAY   EXPECT DC TOMORROW

## 2017-02-16 NOTE — Anesthesia Postprocedure Evaluation (Signed)
Anesthesia Post Note  Patient: Christopher [name]  Procedure(s) Performed: TOTAL KNEE ARTHROPLASTY (Right Knee)  Patient location during evaluation: Nursing Unit Anesthesia Type: General Level of consciousness: awake and alert and patient cooperative Pain management: pain level controlled Vital Signs Assessment: post-procedure vital signs reviewed and stable Respiratory status: spontaneous breathing, nonlabored ventilation and respiratory function stable Cardiovascular status: blood pressure returned to baseline Postop Assessment: no backache, no apparent nausea or vomiting and adequate PO intake Anesthetic complications: no     Last Vitals:  Vitals:   02/16/17 0000 02/16/17 0400  BP: (!) 152/81 120/64  Pulse: 69 63  Resp: 18 18  Temp: 37.3 C 37.4 C  SpO2: 97% 98%    Last Pain:  Vitals:   02/16/17 0656  TempSrc:   PainSc: 0-No pain                 Mandeep Ferch J

## 2017-02-16 NOTE — Progress Notes (Signed)
Physical Therapy Treatment Patient Details Name: Christopher [name] MRN: [id number] DOB: 08/13/1960 Today's Date: 02/16/2017  RIGHT/LEFT KNEE ROM: 5-93 degrees AMBULATION DISTANCE: 90 feet CPM ROM: 0-95 degrees    History of Present Illness Christopher [name] is a 57 y/o male s/p Right TKA 02/15/17  with the diagnosis of primary osteoarthritis right knee    PT Comments    Patient demonstrates increased endurance for gait training with fair/good return for right heel to toe stepping, increased right knee flexion and extension while completing exercises and tolerated sitting up in chair with RLE dangling after therapy.  Patient will benefit from continued physical therapy in hospital and recommended venue below to increase strength, balance, endurance for safe ADLs and gait.   Follow Up Recommendations  Home health PT;Supervision for mobility/OOB     Equipment Recommendations  Rolling walker with 5" wheels    Recommendations for Other Services       Precautions / Restrictions Precautions Precautions: Fall Precaution Comments: no pillows underneath right knee Restrictions Weight Bearing Restrictions: Yes RLE Weight Bearing: Weight bearing as tolerated    Mobility  Bed Mobility Overal bed mobility: Needs Assistance Bed Mobility: Supine to Sit     Supine to sit: Min guard        Transfers Overall transfer level: Needs assistance Equipment used: Rolling walker (2 wheeled) Transfers: Sit to/from [facility] Sit to Stand: Supervision Stand pivot transfers: Supervision       General transfer comment: good return for proper placement of RLE during sit to stands  Ambulation/Gait Ambulation/Gait assistance: Supervision Ambulation Distance (Feet): 90 Feet Assistive device: Rolling walker (2 wheeled) Gait Pattern/deviations: Decreased step length - right;Decreased stance time - right;Decreased stride length   Gait velocity interpretation: Below normal speed  for age/gender General Gait Details: increased tolerance for right heel to toe stepping without loss of balance, slow labored cadence, limited secondary to fatigue, right knee pain   Stairs            Wheelchair Mobility    Modified Rankin (Stroke Patients Only)       Balance Overall balance assessment: Needs assistance Sitting-balance support: No upper extremity supported;Feet supported Sitting balance-Leahy Scale: Good     Standing balance support: Bilateral upper extremity supported;During functional activity Standing balance-Leahy Scale: Fair Standing balance comment: fair/good with RW                            Cognition Arousal/Alertness: Awake/alert Behavior During Therapy: WFL for tasks assessed/performed Overall Cognitive Status: Within Functional Limits for tasks assessed                                        Exercises Total Joint Exercises Ankle Circles/Pumps: Supine;AROM;Strengthening;Right;10 reps Quad Sets: Supine;AROM;Strengthening;Right;10 reps Short Arc Quad: Supine;AAROM;Strengthening;Right;10 reps Heel Slides: Supine;AAROM;Strengthening;Right;10 reps    General Comments        Pertinent Vitals/Pain Pain Score: 6  Pain Location: right knee Pain Descriptors / Indicators: Aching Pain Intervention(s): Limited activity within patient's tolerance;Monitored during session    Home Living                      Prior Function            PT Goals (current goals can now be found in the care plan section) Acute Rehab PT Goals Patient  Stated Goal: return home PT Goal Formulation: With patient Time For Goal Achievement: 02/18/17 Potential to Achieve Goals: Good Progress towards PT goals: Progressing toward goals    Frequency    7X/week      PT Plan Current plan remains appropriate    Co-evaluation              AM-PAC PT "6 Clicks" Daily Activity  Outcome Measure  Difficulty turning over in  bed (including adjusting bedclothes, sheets and blankets)?: A Little Difficulty moving from lying on back to sitting on the side of the bed? : A Little Difficulty sitting down on and standing up from a chair with arms (e.g., wheelchair, bedside commode, etc,.)?: A Little Help needed moving to and from a bed to chair (including a wheelchair)?: A Little Help needed walking in hospital room?: A Little Help needed climbing 3-5 steps with a railing? : A Little 6 Click Score: 18    End of Session   Activity Tolerance: Patient tolerated treatment well;Patient limited by fatigue Patient left: in chair;with call bell/phone within reach;with family/visitor present Nurse Communication: Mobility status PT Visit Diagnosis: Unsteadiness on feet (R26.81);Muscle weakness (generalized) (M62.81)     Time: 8338-2505 PT Time Calculation (min) (ACUTE ONLY): 31 min  Charges:  $Gait Training: 8-22 mins $Therapeutic Exercise: 8-22 mins                    G Codes:       12:01 PM, Feb 23, 2017 Lonell Grandchild, MPT Physical Therapist with [facility] [phone] office [phone] mobile phone

## 2017-02-16 NOTE — Progress Notes (Signed)
**Note De-identified Christopher [name] Obfuscation** EKG complete; placed in patient chart 

## 2017-02-16 NOTE — Progress Notes (Signed)
*  PRELIMINARY RESULTS* Echocardiogram 2D Echocardiogram has been performed.  Samuel Germany 02/16/2017, 10:23 AM

## 2017-02-16 NOTE — Addendum Note (Signed)
Addendum  created 02/16/17 1406 by Charmaine Downs, CRNA   Sign clinical note

## 2017-02-17 LAB — CBC
HCT: 34.8 % — ABNORMAL LOW (ref 39.0–52.0)
Hemoglobin: 11.7 g/dL — ABNORMAL LOW (ref 13.0–17.0)
MCH: 29.6 pg (ref 26.0–34.0)
MCHC: 33.6 g/dL (ref 30.0–36.0)
MCV: 88.1 fL (ref 78.0–100.0)
PLATELETS: 196 10*3/uL (ref 150–400)
RBC: 3.95 MIL/uL — ABNORMAL LOW (ref 4.22–5.81)
RDW: 12.4 % (ref 11.5–15.5)
WBC: 16.6 10*3/uL — AB (ref 4.0–10.5)

## 2017-02-17 MED ORDER — AMLODIPINE BESYLATE 5 MG PO TABS
10.0000 mg | ORAL_TABLET | Freq: Every day | ORAL | Status: DC
  Administered 2017-02-17: 10 mg via ORAL
  Filled 2017-02-17: qty 2

## 2017-02-17 MED ORDER — METHOCARBAMOL 500 MG PO TABS: 500.0000 mg | ORAL_TABLET | Freq: Four times a day (QID) | ORAL | 3 refills | Status: DC | PRN

## 2017-02-17 MED ORDER — HYDROCODONE-ACETAMINOPHEN 7.5-325 MG PO TABS: 1.0000 | ORAL_TABLET | Freq: Four times a day (QID) | ORAL | 0 refills | Status: DC

## 2017-02-17 MED ORDER — DOCUSATE SODIUM 100 MG PO CAPS: 100.0000 mg | ORAL_CAPSULE | Freq: Two times a day (BID) | ORAL | 0 refills | Status: DC

## 2017-02-17 NOTE — Progress Notes (Signed)
Patient states understanding of discharge instructions.  

## 2017-02-17 NOTE — Care Management (Signed)
CM verified DC home today with [facility] rep.

## 2017-02-17 NOTE — Discharge Summary (Signed)
Physician Discharge Summary  Patient ID: Christopher [name] MRN: [id number] DOB/AGE: 1960/06/03 57 y.o.  Admit date: 02/15/2017 Discharge date: 02/17/2017  Admission Diagnoses: Osteoarthritis right knee  Discharge Diagnoses: Same Active Problems:   Primary osteoarthritis of right knee   Primary osteoarthritis of knee   Discharged Condition: good  Hospital Course:  Hospital day 1 the patient underwent uncomplicated  Right total knee replacement with the [place] fixed-bearing posterior stabilized total knee replacement   He was placed on telemetry secondary to abnormal heart rhythm in the postop PACU  Cardiology consult was obtained they recommend starting metoprolol.  He had another echo cardiogram which did not show any abnormalities  Study Conclusions   - Left ventricle: The cavity size was normal. Wall thickness was   increased in a pattern of mild LVH. Systolic function was normal.   The estimated ejection fraction was in the range of 60% to 65%.   Wall motion was normal; there were no regional wall motion   abnormalities. Left ventricular diastolic function parameters   were normal. - Pulmonic valve: There was mild regurgitation.   ------------------------------------------------------------------- Study data:  Comparison was made to the study of 05/14/2013.  Study status:  Routine.  Procedure:  The patient reported no pain pre or post test. Transthoracic echocardiography. Image quality was adequate.  Study completion:  There were no complications. Transthoracic echocardiography.  M-mode, complete 2D, spectral Doppler, and color Doppler.  Birthdate:  Patient birthdate: 19-Apr-1960.  Age:  Patient is 57 yr old.  Sex:  Gender: male. BMI: 26.2 kg/m^2.  Blood pressure:     120/64  Patient status: Inpatient.  Study date:  Study date: 02/16/2017. Study time: 09:36 AM.  Location:  Bedside.   -------------------------------------------------------------------    ------------------------------------------------------------------- Left ventricle:  The cavity size was normal. Wall thickness was increased in a pattern of mild LVH. Systolic function was normal. The estimated ejection fraction was in the range of 60% to 65%. Wall motion was normal; there were no regional wall motion abnormalities. The transmitral flow pattern was normal. The deceleration time of the early transmitral flow velocity was normal. The pulmonary vein flow pattern was normal. The tissue Doppler parameters were normal. Left ventricular diastolic function parameters were normal.   ------------------------------------------------------------------- Aortic valve:   Trileaflet.  Doppler:   There was no stenosis. There was no regurgitation.   ------------------------------------------------------------------- Aorta:  Aortic root: The aortic root was normal in size.   ------------------------------------------------------------------- Mitral valve:   Normal thickness leaflets .  Doppler:  There was no significant regurgitation.    Peak gradient (D): 4 mm Hg.   ------------------------------------------------------------------- Left atrium:  The atrium was normal in size.   ------------------------------------------------------------------- Atrial septum:  No defect or patent foramen ovale was identified.   ------------------------------------------------------------------- Right ventricle:  The cavity size was normal. Wall thickness was normal. Systolic function was normal.   ------------------------------------------------------------------- Pulmonic valve:    The valve appears to be grossly normal. Doppler:  There was mild regurgitation.   ------------------------------------------------------------------- Tricuspid valve:   Structurally normal valve.   Leaflet separation was normal.  Doppler:  Transvalvular velocity was within the normal range. There was trivial  regurgitation.   ------------------------------------------------------------------- Right atrium:  The atrium was normal in size.   ------------------------------------------------------------------- Pericardium:  There was no pericardial effusion.   ------------------------------------------------------------------- Systemic veins: Inferior vena cava: The vessel was normal in size. The respirophasic diameter changes were in the normal range (>= 50%), consistent with normal central venous pressure.   ------------------------------------------------------------------- Measurements  Left ventricle                           Value        Reference  LV ID, ED, PLAX chordal                  43.8  mm     43 - 52  LV ID, ES, PLAX chordal                  23.4  mm     23 - 38  LV fx shortening, PLAX chordal           47    %      >=29  LV PW thickness, ED                      11.2  mm     ----------  IVS/LV PW ratio, ED                      0.98         <=1.3  Stroke volume, 2D                        62    ml     ----------  Stroke volume/bsa, 2D                    30    ml/m^2 ----------  LV ejection fraction, 1-p A4C            75    %      ----------  LV end-diastolic volume, 2-p             77    ml     ----------  LV end-systolic volume, 2-p              21    ml     ----------  LV ejection fraction, 2-p                73    %      ----------  Stroke volume, 2-p                       56    ml     ----------  LV end-diastolic volume/bsa, 2-p         37    ml/m^2 ----------  LV end-systolic volume/bsa, 2-p          10    ml/m^2 ----------  Stroke volume/bsa, 2-p                   27    ml/m^2 ----------  LV e&', lateral                           11.1  cm/s   ----------  LV E/e&', lateral                         9.37         ----------  LV e&', medial                            9.68  cm/s   ----------  LV E/e&', medial  10.74        ----------  LV e&', average                            10.39 cm/s   ----------  LV E/e&', average                         10.01        ----------    Ventricular septum                       Value        Reference  IVS thickness, ED                        11    mm     ----------    LVOT                                     Value        Reference  LVOT ID, S                               18    mm     ----------  LVOT area                                2.54  cm^2   ----------  LVOT peak velocity, S                    121   cm/s   ----------  LVOT mean velocity, S                    74.6  cm/s   ----------  LVOT VTI, S                              24.6  cm     ----------  LVOT peak gradient, S                    6     mm Hg  ----------    Aorta                                    Value        Reference  Aortic root ID, ED                       26    mm     ----------    Left atrium                              Value        Reference  LA ID, A-P, ES                           29    mm     ----------  LA ID/bsa, A-P  1.4   cm/m^2 <=2.2  LA volume, S                             50.9  ml     ----------  LA volume/bsa, S                         24.5  ml/m^2 ----------  LA volume, ES, 1-p A4C                   56    ml     ----------  LA volume/bsa, ES, 1-p A4C               26.9  ml/m^2 ----------  LA volume, ES, 1-p A2C                   43.5  ml     ----------  LA volume/bsa, ES, 1-p A2C               20.9  ml/m^2 ----------    Mitral valve                             Value        Reference  Mitral E-wave peak velocity              104   cm/s   ----------  Mitral A-wave peak velocity              74.4  cm/s   ----------  Mitral deceleration time                 204   ms     150 - 230  Mitral peak gradient, D                  4     mm Hg  ----------  Mitral E/A ratio, peak                   1.4          ----------    Right atrium                             Value        Reference  RA ID, S-I,  ES, A4C              (H)     55.6  mm     34 - 49  RA area, ES, A4C                 (H)     19.7  cm^2   8.3 - 19.5  RA volume, ES, A/L                       57.2  ml     ----------  RA volume/bsa, ES, A/L                   27.5  ml/m^2 ----------    Systemic veins                           Value        Reference  Estimated CVP  3     mm Hg  ----------    Right ventricle                          Value        Reference  RV ID, ED, PLAX                          29.6  mm     19 - 38  TAPSE                                    29    mm     ----------  RV s&', lateral, S                        13.2  cm/s   ----------   Legend: (L)  and  (H)  mark values outside specified reference range.   ------------------------------------------------------------------- Prepared and Electronically Authenticated by   Kate Sable, MD 2019-01-16T10:57:00 This patients CHA2DS2-VASc Score and unadjusted Ischemic Stroke Rate (% per year) is equal to 1.  Thus, systemic anticoagulation is not warranted at this time.     I personally reviewed his echocardiogram.  It demonstrates normal left ventricular systolic and diastolic function with normal left atrial size.     We will increase metoprolol for heart rate control.  His last episode of atrial fibrillation and atrial flutter appeared to occur at 8:13 AM.  [name] currently in sinus rhythm with PVCs.  We will make certain he has outpatient follow-up.       Kate Sable, MD, [facility]   02/16/2017 11:06 AM  Discharge Exam: Blood pressure 131/88, pulse 93, temperature 98.2 F (36.8 C), temperature source Oral, resp. rate 18, height 5\' 11"  (1.803 m), weight 188 lb (85.3 kg), SpO2 99 %. Clean wound is soft calf negative Homans sign  Disposition: 01-Home or Self Care  Discharge Instructions    CPM   Complete by:  As directed    Continuous passive motion machine (CPM):      Use the CPM from 0 to 90 for 6 hours per day.       You may increase by 10 per day.  You may break it up into 2 or 3 sessions per day.      Use CPM for 2 weeks or until you are told to stop.   Call MD / Call 911   Complete by:  As directed    If you experience chest pain or shortness of breath, CALL 911 and be transported to the hospital emergency room.  If you develope a fever above 101 F, pus (white drainage) or increased drainage or redness at the wound, or calf pain, call your surgeon's office.   Change dressing   Complete by:  As directed    Do not change the dressing   Constipation Prevention   Complete by:  As directed    Drink plenty of fluids.  Prune juice may be helpful.  You may use a stool softener, such as Colace (over the counter) 100 mg twice a day.  Use MiraLax (over the counter) for constipation as needed.   Diet - low sodium heart healthy   Complete by:  As directed    Do not put a pillow under the knee. Place it under  the heel.   Complete by:  As directed    Increase activity slowly as tolerated   Complete by:  As directed    TED hose   Complete by:  As directed    Use stockings (TED hose) for 2 weeks both legs     Allergies as of 02/17/2017      Reactions   Hydrocodone Itching      Medication List    STOP taking these medications   oxyCODONE-acetaminophen 10-325 MG tablet Commonly known as:  PERCOCET     TAKE these medications   amLODipine 5 MG tablet Commonly known as:  NORVASC Take 1 tablet (5 mg total) by mouth daily.   aspirin EC 81 MG tablet Take 81 mg by mouth daily.   atorvastatin 10 MG tablet Commonly known as:  LIPITOR TAKE 1 TABLET BY MOUTH DAILY   diclofenac 75 MG EC tablet Commonly known as:  VOLTAREN Take 1 tablet (75 mg total) by mouth 2 (two) times daily.   docusate sodium 100 MG capsule Commonly known as:  COLACE Take 1 capsule (100 mg total) by mouth 2 (two) times daily.   HYDROcodone-acetaminophen 7.5-325 MG tablet Commonly known as:  NORCO Take 1 tablet by mouth every 6  (six) hours.   methocarbamol 500 MG tablet Commonly known as:  ROBAXIN Take 1 tablet (500 mg total) by mouth every 6 (six) hours as needed for muscle spasms.   metoprolol tartrate 25 MG tablet Commonly known as:  LOPRESSOR Take 0.5 tablets (12.5 mg total) by mouth 2 (two) times daily.            Durable Medical Equipment  (From admission, onward)        Start     Ordered   02/16/17 0930  For home use only DME Walker rolling  Once    Question:  Patient needs a walker to treat with the following condition  Answer:  S/P TKR (total knee replacement)   02/16/17 0930       Discharge Care Instructions  (From admission, onward)        Start     Ordered   02/17/17 0000  Change dressing    Comments:  Do not change the dressing   02/17/17 1420     Follow-up Information    Erma Heritage, PA-C Follow up on 03/04/2017.   Specialties:  Physician Assistant, Cardiology Why:  Cardiology Hospital Follow-Up on 03/04/2017 at 3:30PM.  Contact information: [address] [place] [address] [phone]           Signed: [name] 02/17/2017, 2:21 PM

## 2017-02-17 NOTE — Progress Notes (Signed)
No further recommendations. Will make outpatient follow up appointment. Will sign off.

## 2017-02-17 NOTE — Progress Notes (Signed)
Physical Therapy Treatment Patient Details Name: Christopher [name] MRN: [id number] DOB: 1960/12/07 Today's Date: 02/17/2017  RIGHT/LEFT KNEE ROM: 8-95 degrees AMBULATION DISTANCE: 120 feet CPM ROM: 0-105 degrees     History of Present Illness Christopher [name] is a 57 y/o male s/p Right TKA 02/15/17  with the diagnosis of primary osteoarthritis right knee    PT Comments    Patient demonstrates good return for going up/down stairs using 1 side rail and SPC without loss of balance and understanding acknowledged, increased right active/passive knee flexion, limited for right knee extension due to increased pain at end range.  Patient will benefit from continued physical therapy in hospital and recommended venue below to increase strength, balance, endurance for safe ADLs and gait.   Follow Up Recommendations  Home health PT;Supervision for mobility/OOB     Equipment Recommendations  Rolling walker with 5" wheels    Recommendations for Other Services       Precautions / Restrictions Precautions Precautions: Fall Precaution Comments: no pillows underneath right knee Restrictions Weight Bearing Restrictions: Yes RLE Weight Bearing: Weight bearing as tolerated    Mobility  Bed Mobility Overal bed mobility: Modified Independent Bed Mobility: Supine to Sit     Supine to sit: Modified independent (Device/Increase time)     General bed mobility comments: good return for moving RLE when sitting up at bedside  Transfers Overall transfer level: Modified independent Equipment used: Rolling walker (2 wheeled) Transfers: Sit to/from [facility] Sit to Stand: Modified independent (Device/Increase time) Stand pivot transfers: Modified independent (Device/Increase time)       General transfer comment: good return for proper placement of RLE during sit to stands and transfers  Ambulation/Gait Ambulation/Gait assistance: Supervision Ambulation Distance (Feet): 120  Feet Assistive device: Rolling walker (2 wheeled) Gait Pattern/deviations: Decreased step length - right;Decreased stance time - right;Decreased stride length   Gait velocity interpretation: Below normal speed for age/gender General Gait Details: good return for right heel to toe stepping without loss of balance, slow labored cadence, limited secondary to fatigue, right knee pain   Stairs Stairs: Yes   Stair Management: With cane;One rail Right;Step to pattern Number of Stairs: 9 General stair comments: Patient demonstrates good return for going up/down 9 steps using 1 siderail and SPC without loss of balance and understanding acknowledged  Wheelchair Mobility    Modified Rankin (Stroke Patients Only)       Balance Overall balance assessment: Needs assistance Sitting-balance support: No upper extremity supported;Feet supported Sitting balance-Leahy Scale: Good     Standing balance support: Bilateral upper extremity supported;During functional activity Standing balance-Leahy Scale: Fair Standing balance comment: fair/good with RW                            Cognition Arousal/Alertness: Awake/alert Behavior During Therapy: WFL for tasks assessed/performed Overall Cognitive Status: Within Functional Limits for tasks assessed                                        Exercises Total Joint Exercises Ankle Circles/Pumps: Supine;AROM;Strengthening;Right;10 reps Quad Sets: Supine;AROM;Strengthening;Right;10 reps Short Arc Quad: Supine;AAROM;Strengthening;Right;10 reps Heel Slides: Supine;AAROM;Strengthening;Right;10 reps Goniometric ROM: RIGHT KNEE: 8-95 degrees    General Comments        Pertinent Vitals/Pain Pain Score: 8  Pain Location: right knee Pain Descriptors / Indicators: Aching;Sharp Pain Intervention(s): Limited activity within  patient's tolerance;Monitored during session    Home Living                      Prior Function             PT Goals (current goals can now be found in the care plan section) Acute Rehab PT Goals Patient Stated Goal: return home PT Goal Formulation: With patient Time For Goal Achievement: 02/18/17 Potential to Achieve Goals: Good Progress towards PT goals: Progressing toward goals    Frequency    7X/week      PT Plan Current plan remains appropriate    Co-evaluation              AM-PAC PT "6 Clicks" Daily Activity  Outcome Measure  Difficulty turning over in bed (including adjusting bedclothes, sheets and blankets)?: None Difficulty moving from lying on back to sitting on the side of the bed? : None Difficulty sitting down on and standing up from a chair with arms (e.g., wheelchair, bedside commode, etc,.)?: None Help needed moving to and from a bed to chair (including a wheelchair)?: A Little Help needed walking in hospital room?: A Little Help needed climbing 3-5 steps with a railing? : A Little 6 Click Score: 21    End of Session   Activity Tolerance: Patient tolerated treatment well;Patient limited by fatigue Patient left: in chair;with call bell/phone within reach Nurse Communication: Mobility status PT Visit Diagnosis: Unsteadiness on feet (R26.81);Muscle weakness (generalized) (M62.81)     Time: 1031-1100 PT Time Calculation (min) (ACUTE ONLY): 29 min  Charges:  $Gait Training: 8-22 mins $Therapeutic Exercise: 8-22 mins                    G Codes:       3:12 PM, Mar 18, 2017 Lonell Grandchild, MPT Physical Therapist with [facility] [phone] office [phone] mobile phone

## 2017-02-21 ENCOUNTER — Telehealth: Payer: Self-pay | Admitting: Radiology

## 2017-02-21 NOTE — Telephone Encounter (Signed)
I gave verbal order for him to have his physical therapy home 3x weekly for 2 weeks  He has allergy listed to hydrocodone, it caused itching in the past  I called him and told him to use Benadryl if he has itching, warned will make him drowsy. He states he is not itching, but if he does start to itch he will use the benadryl.   To you FYI

## 2017-02-22 LAB — TYPE AND SCREEN
ABO/RH(D): O POS
ANTIBODY SCREEN: NEGATIVE
Unit division: 0
Unit division: 0

## 2017-02-22 LAB — BPAM RBC
BLOOD PRODUCT EXPIRATION DATE: 201902042359
BLOOD PRODUCT EXPIRATION DATE: 201902142359
Unit Type and Rh: 5100
Unit Type and Rh: 5100

## 2017-02-25 ENCOUNTER — Telehealth: Payer: Self-pay | Admitting: Orthopedic Surgery

## 2017-02-25 NOTE — Telephone Encounter (Signed)
Benadryl 50 mg at night

## 2017-02-25 NOTE — Telephone Encounter (Signed)
I have called patient to advise.  

## 2017-02-25 NOTE — Telephone Encounter (Signed)
Patient called asking about getting something prescribed to help him sleep.  Please call and advise

## 2017-02-25 NOTE — Telephone Encounter (Signed)
S/p knee replacement 02/15/17 states not sleeping wants meds to help him sleep please advise

## 2017-03-01 DIAGNOSIS — Z96651 Presence of right artificial knee joint: Secondary | ICD-10-CM | POA: Insufficient documentation

## 2017-03-02 ENCOUNTER — Other Ambulatory Visit: Payer: Self-pay | Admitting: Orthopedic Surgery

## 2017-03-02 ENCOUNTER — Telehealth (HOSPITAL_COMMUNITY): Payer: Self-pay

## 2017-03-02 ENCOUNTER — Telehealth: Payer: Self-pay | Admitting: Orthopedic Surgery

## 2017-03-02 ENCOUNTER — Encounter: Payer: Self-pay | Admitting: Orthopedic Surgery

## 2017-03-02 ENCOUNTER — Ambulatory Visit (INDEPENDENT_AMBULATORY_CARE_PROVIDER_SITE_OTHER): Payer: BLUE CROSS/BLUE SHIELD | Admitting: Orthopedic Surgery

## 2017-03-02 DIAGNOSIS — Z96651 Presence of right artificial knee joint: Secondary | ICD-10-CM

## 2017-03-02 MED ORDER — HYDROCODONE-ACETAMINOPHEN 7.5-325 MG PO TABS: 1.0000 | ORAL_TABLET | Freq: Four times a day (QID) | ORAL | 0 refills | Status: DC

## 2017-03-02 NOTE — Progress Notes (Signed)
Post op TKA  Chief Complaint  Patient presents with  . Routine Post Op    Right TKA DOS 02/15/17    No orders of the defined types were placed in this encounter.   Current Outpatient Medications:  .  acetaminophen (TYLENOL) 325 MG tablet, Take 650 mg by mouth every 6 (six) hours as needed., Disp: , Rfl:  .  amLODipine (NORVASC) 5 MG tablet, Take 1 tablet (5 mg total) by mouth daily., Disp: 30 tablet, Rfl: 5 .  aspirin EC 81 MG tablet, Take 81 mg by mouth daily., Disp: , Rfl:  .  atorvastatin (LIPITOR) 10 MG tablet, TAKE 1 TABLET BY MOUTH DAILY, Disp: 30 tablet, Rfl: 5 .  diclofenac (VOLTAREN) 75 MG EC tablet, Take 1 tablet (75 mg total) by mouth 2 (two) times daily., Disp: 60 tablet, Rfl: 3 .  docusate sodium (COLACE) 100 MG capsule, Take 1 capsule (100 mg total) by mouth 2 (two) times daily., Disp: 10 capsule, Rfl: 0 .  methocarbamol (ROBAXIN) 500 MG tablet, Take 1 tablet (500 mg total) by mouth every 6 (six) hours as needed for muscle spasms., Disp: 30 tablet, Rfl: 3 .  metoprolol tartrate (LOPRESSOR) 25 MG tablet, Take 0.5 tablets (12.5 mg total) by mouth 2 (two) times daily., Disp: 30 tablet, Rfl: 0 .  HYDROcodone-acetaminophen (NORCO) 7.5-325 MG tablet, Take 1 tablet by mouth every 6 (six) hours. (Patient not taking: Reported on 03/02/2017), Disp: 30 tablet, Rfl: 0  5-72 range of motion, wound is clean staples were removed, ambulating with a cane.  Minimal pain in the knee no swelling.  Recommend continue physical therapy as an outpatient and I told him 90 degrees x 6 weeks or he will have to have manipulation

## 2017-03-02 NOTE — Telephone Encounter (Signed)
L/m at Dr. Fortino Sic office requested [name] fax PT referral for TKR - right knee per patient. NF 03/02/17 Pt will be d/c from [facility] on Mar 04, 2017. NF 03/02/17

## 2017-03-02 NOTE — Telephone Encounter (Signed)
Patient states he forgot to ask about getting a refill on Hydrocodone/Acetaminopphen 7.5-325  Mgs.   Qty  30  Sig: Take 1 tablet by mouth every 6 (six) hours.  Patient not taking: Reported on 03/02/2017   Patient states he uses [facility] representative in [place]

## 2017-03-03 NOTE — Progress Notes (Signed)
Cardiology Office Note    Date:  03/04/2017   ID:  Christopher [name], DOB August 16, 1960, MRN [id number]  PCP:  Kathyrn Drown, MD  Cardiologist: Dr. Harl Bowie   Chief Complaint  Patient presents with  . Hospitalization Follow-up    History of Present Illness:    Christopher [name] is a 57 y.o. male with past medical history of chest pain (Coronary CT in 06/2016 showing a calcium score of 0), HTN, and HLD who presents to the office today for hospital follow-up.   He was recently admitted to [facility] on 02/15/2017 for a planned right total knee replacement and went into post-operative atrial fibrillation with RVR. Cardiology was consulted to assist with management of his arrhythmia. His Lopressor was increased to 50mg  BID and he spontaneously converted to NSR during admission. Echocardiogram showed a preserved EF of 60-65% with no regional WMA. He was not started on long-term anticoagulation given his CHA2DS2-VASc Score of 1 (HTN) and his arrhythmia occurring in the post-operative setting.   In talking with the patient today, he reports overall doing well from a cardiac perspective since his recent hospitalization. Denies any recurrent chest pain or palpitations. No recent dyspnea on exertion, orthopnea, PND, or lower extremity edema.   [name] still having pain along his right knee following total knee replacement and is scheduled to begin physical therapy next week. Reports pain is usually worse during the nighttime hours.   BP is initially elevated to 182/88 during today's visit, slightly improved to 162/84 on recheck. Lopressor dosing was initially increased during his hospitalization but unfortunately was continued at his PTA dosing by review of the discharge summary, therefore he has continued on Lopressor 12.5mg  BID.    Past Medical History:  Diagnosis Date  . Arthritis   . Atrial fibrillation ([place])    a. occuring in the post-operative setting in 02/2017  . HTN (hypertension)   .  Hyperlipemia     Past Surgical History:  Procedure Laterality Date  . HERNIA REPAIR Right   . KNEE ARTHROSCOPY  right knee  . TOTAL KNEE ARTHROPLASTY Right 02/15/2017   Procedure: TOTAL KNEE ARTHROPLASTY;  Surgeon: Carole Civil, MD;  Location: [facility];  Service: Orthopedics;  Laterality: Right;    Current Medications: Outpatient Medications Prior to Visit  Medication Sig Dispense Refill  . acetaminophen (TYLENOL) 325 MG tablet Take 650 mg by mouth every 6 (six) hours as needed.    [name] amLODipine (NORVASC) 5 MG tablet Take 1 tablet (5 mg total) by mouth daily. 30 tablet 5  . aspirin EC 81 MG tablet Take 81 mg by mouth daily.    [name] atorvastatin (LIPITOR) 10 MG tablet TAKE 1 TABLET BY MOUTH DAILY 30 tablet 5  . diclofenac (VOLTAREN) 75 MG EC tablet Take 1 tablet (75 mg total) by mouth 2 (two) times daily. 60 tablet 3  . docusate sodium (COLACE) 100 MG capsule Take 1 capsule (100 mg total) by mouth 2 (two) times daily. 10 capsule 0  . HYDROcodone-acetaminophen (NORCO) 7.5-325 MG tablet Take 1 tablet by mouth every 6 (six) hours. 42 tablet 0  . methocarbamol (ROBAXIN) 500 MG tablet Take 1 tablet (500 mg total) by mouth every 6 (six) hours as needed for muscle spasms. 30 tablet 3  . metoprolol tartrate (LOPRESSOR) 25 MG tablet Take 0.5 tablets (12.5 mg total) by mouth 2 (two) times daily. 30 tablet 0   No facility-administered medications prior to visit.      Allergies:  Hydrocodone   Social History   Socioeconomic History  . Marital status: Married    Spouse name: None  . Number of children: None  . Years of education: 55  . Highest education level: None  Social Needs  . Financial resource strain: None  . Food insecurity - worry: None  . Food insecurity - inability: None  . Transportation needs - medical: None  . Transportation needs - non-medical: None  Occupational History  . Occupation: truck Education administrator: [employer]  Tobacco Use  . Smoking status: Never  Smoker  . Smokeless tobacco: Never Used  Substance and Sexual Activity  . Alcohol use: Yes    Comment: occasional beer  . Drug use: No  . Sexual activity: Yes    Birth control/protection: None  Other Topics Concern  . None  Social History Narrative  . None     Family History:  The patient's family history includes Arthritis in his unknown relative; CAD (age of onset: 1) in his brother; CAD (age of onset: 15) in his father; Cancer in his unknown relative; Diabetes in his brother and unknown relative; Other (age of onset: 27) in his mother.   Review of Systems:   Please see the history of present illness.     General:  No chills, fever, night sweats or weight changes.  Cardiovascular:  No chest pain, dyspnea on exertion, edema, orthopnea, palpitations, paroxysmal nocturnal dyspnea. Dermatological: No rash, lesions/masses Respiratory: No cough, dyspnea Urologic: No hematuria, dysuria MSK: Positive for right knee pain (improving).  Abdominal:   No nausea, vomiting, diarrhea, bright red blood per rectum, melena, or hematemesis Neurologic:  No visual changes, wkns, changes in mental status. All other systems reviewed and are otherwise negative except as noted above.   Physical Exam:    VS:  BP (!) 162/84   Pulse 97   Ht 5\' 11"  (1.803 m)   Wt 182 lb (82.6 kg)   SpO2 98%   BMI 25.38 kg/m    General: Well developed, well nourished [place] American male appearing in no acute distress. Head: Normocephalic, atraumatic, sclera non-icteric, no xanthomas, nares are without discharge.  Neck: No carotid bruits. JVD not elevated.  Lungs: Respirations regular and unlabored, without wheezes or rales.  Heart: Regular rate and rhythm with frequent ectopic beats. No S3 or S4.  No murmur, no rubs, or gallops appreciated. Abdomen: Soft, non-tender, non-distended with normoactive bowel sounds. No hepatomegaly. No rebound/guarding. No obvious abdominal masses. Msk:  Strength and tone appear normal  for age. No joint deformities or effusions. Extremities: No clubbing or cyanosis. No edema.  Distal pedal pulses are 2+ bilaterally. Swelling along right knee.  Neuro: Alert and oriented X 3. Moves all extremities spontaneously. No focal deficits noted. Psych:  Responds to questions appropriately with a normal affect. Skin: No rashes or lesions noted  Wt Readings from Last 3 Encounters:  03/04/17 182 lb (82.6 kg)  03/02/17 188 lb (85.3 kg)  02/15/17 188 lb (85.3 kg)      Studies/Labs Reviewed:   EKG:  EKG is  ordered today.  The ekg ordered today demonstrates NSR, HR 97, with frequent PVC's in a bigeminal pattern.   Recent Labs: 06/06/2016: TSH 0.741 02/09/2017: ALT 24 02/16/2017: BUN 11; Creatinine, Ser 1.15; Potassium 3.5; Sodium 138 02/17/2017: Hemoglobin 11.7; Platelets 196   Lipid Panel    Component Value Date/Time   CHOL 188 06/12/2016 0834   TRIG 109 06/12/2016 0834   HDL 66 06/12/2016 0834  CHOLHDL 2.8 06/12/2016 0834   CHOLHDL 3.0 06/07/2016 0440   VLDL 20 06/07/2016 0440   LDLCALC 100 (H) 06/12/2016 0834    Additional studies/ records that were reviewed today include:   Echocardiogram: 02/16/2017 Study Conclusions  - Left ventricle: The cavity size was normal. Wall thickness was   increased in a pattern of mild LVH. Systolic function was normal.   The estimated ejection fraction was in the range of 60% to 65%.   Wall motion was normal; there were no regional wall motion   abnormalities. Left ventricular diastolic function parameters   were normal. - Pulmonic valve: There was mild regurgitation.  Assessment:    1. Postoperative atrial fibrillation ([place])   2. Essential hypertension   3. Precordial pain      Plan:   In order of problems listed above:  1. Post-operative Atrial Fibrillation - recently admitted for a right total knee replacement and went into post-operative atrial fibrillation with RVR. Experienced spontaneous conversion to NSR during  admission. He was not started on long-term anticoagulation given his CHA2DS2-VASc Score of 1 (HTN) and his arrhythmia occurring in the post-operative setting.  - he denies any recurrent palpitations or dyspnea. EKG today shows he is maintaining NSR with frequent ventricular ectopy. Recent labs showed no acute electrolyte abnormalities and echo showed a preserved EF with no regional WMA. Will further titrate Lopressor to 25mg  BID to assist with his ectopic beats and elevated BP.   2. HTN - BP initially elevated to 182/88 during today's visit, slightly improved to 162/84 on recheck. Reports having pain along his right knee which is likely playing a role in his elevated BP as well.  - continue Amlodipine 5mg  daily. Will titrate Lopressor as outlined above. I have asked him to continue to follow his BP readings at home and to let us know if this remains elevated.   3. History of Chest Pain - he denies any recent events. Coronary CT in 06/2016 showed a calcium score of 0 and recent echocardiogram showed a preserved EF of 60-65% with no regional WMA.  - no indication for further ischemic evaluation at this time. Continue with risk factor modification.    Medication Adjustments/Labs and Tests Ordered: Current medicines are reviewed at length with the patient today.  Concerns regarding medicines are outlined above.  Medication changes, Labs and Tests ordered today are listed in the Patient Instructions below. Patient Instructions  Medication Instructions:  Your physician recommends that you continue on your current medications as directed. Please refer to the Current Medication list given to you today.  Labwork: None   Testing/Procedures: None   Follow-Up: Your physician wants you to follow-up in: 6 Months. You will receive a reminder letter in the mail two months in advance. If you don't receive a letter, please call our office to schedule the follow-up appointment.   Any Other Special  Instructions Will Be Listed Below (If Applicable).  If you need a refill on your cardiac medications before your next appointment, please call your pharmacy. Thank you for choosing [facility]!    Signed, Erma Heritage, PA-C  03/04/2017 8:34 PM    [place] [address] [place], [place] [address] Phone: [phone]

## 2017-03-04 ENCOUNTER — Encounter: Payer: Self-pay | Admitting: *Deleted

## 2017-03-04 ENCOUNTER — Ambulatory Visit (INDEPENDENT_AMBULATORY_CARE_PROVIDER_SITE_OTHER): Payer: BLUE CROSS/BLUE SHIELD | Admitting: Student

## 2017-03-04 VITALS — BP 162/84 | HR 97 | Ht 71.0 in | Wt 182.0 lb

## 2017-03-04 DIAGNOSIS — I9789 Other postprocedural complications and disorders of the circulatory system, not elsewhere classified: Secondary | ICD-10-CM | POA: Diagnosis not present

## 2017-03-04 DIAGNOSIS — I10 Essential (primary) hypertension: Secondary | ICD-10-CM

## 2017-03-04 DIAGNOSIS — R072 Precordial pain: Secondary | ICD-10-CM

## 2017-03-04 DIAGNOSIS — I4891 Unspecified atrial fibrillation: Secondary | ICD-10-CM

## 2017-03-04 MED ORDER — METOPROLOL TARTRATE 25 MG PO TABS: 25.0000 mg | ORAL_TABLET | Freq: Two times a day (BID) | ORAL | 3 refills | Status: DC

## 2017-03-04 NOTE — Patient Instructions (Signed)
Medication Instructions:  Your physician recommends that you continue on your current medications as directed. Please refer to the Current Medication list given to you today.   Labwork: None   Testing/Procedures: None   Follow-Up: Your physician wants you to follow-up in: 6 Months. You will receive a reminder letter in the mail two months in advance. If you don't receive a letter, please call our office to schedule the follow-up appointment.   Any Other Special Instructions Will Be Listed Below (If Applicable).     If you need a refill on your cardiac medications before your next appointment, please call your pharmacy. Thank you for choosing [facility]!

## 2017-03-07 ENCOUNTER — Ambulatory Visit (HOSPITAL_COMMUNITY): Payer: BLUE CROSS/BLUE SHIELD | Attending: Orthopedic Surgery

## 2017-03-07 ENCOUNTER — Encounter (HOSPITAL_COMMUNITY): Payer: Self-pay

## 2017-03-07 ENCOUNTER — Other Ambulatory Visit: Payer: Self-pay

## 2017-03-07 DIAGNOSIS — M25561 Pain in right knee: Secondary | ICD-10-CM

## 2017-03-07 DIAGNOSIS — R2689 Other abnormalities of gait and mobility: Secondary | ICD-10-CM | POA: Diagnosis present

## 2017-03-07 DIAGNOSIS — R29898 Other symptoms and signs involving the musculoskeletal system: Secondary | ICD-10-CM

## 2017-03-07 DIAGNOSIS — M25661 Stiffness of right knee, not elsewhere classified: Secondary | ICD-10-CM | POA: Diagnosis present

## 2017-03-07 NOTE — Patient Instructions (Signed)
   Knee Flexion Heel Slide: 1-2 sets of 10 repetitions with 10 second holds  While in a supine position, hook two straps around the affected limbs foot. Next, use the straps to pull your foot towards your buttock until a maximum bend in your knee is achieved.     QUAD SET WITH TOWEL UNDER HEEL: 1-2 sets of 10 repetitions with 10 second holds  While lying or sitting with a small towel roll under your ankle, tighten your top thigh muscle to press the back of your knee downward towards the ground.    KNEE FLEXION STRETCH - SELF ASSISTED: 1-2 sets of 10 repetitions with 10 second holds  While seated in a chair, use your unaffected leg to bend your affected knee until a stretch is felt.

## 2017-03-07 NOTE — Addendum Note (Signed)
Addended byCandice Camp on: 03/07/2017 01:15 PM   Modules accepted: Orders

## 2017-03-08 NOTE — Therapy (Signed)
[place], [place], [address] Phone: [phone]   Fax:  [phone]  Physical Therapy Evaluation  Patient Details  Name: RENNER SEBALD MRN: [id number] Date of Birth: 1960-07-13 Referring Provider: [name]   # OF FEET WALKED: 286 feet (gait velocity: 0.48 m/s) ROM:  Flexion: 72 degrees            Extension: 13 degrees   Encounter Date: 03/07/2017  PT End of Session - 03/07/17 1119    Visit Number  1    Number of Visits  19    Date for PT Re-Evaluation  03/28/17    Authorization Type  [employer]    Authorization Time Period  03/07/17-04/18/17    PT Start Time  0818    PT Stop Time  0902    PT Time Calculation (min)  44 min    Activity Tolerance  Patient tolerated treatment well    Behavior During Therapy  [facility] for tasks assessed/performed       Past Medical History:  Diagnosis Date  . Arthritis   . Atrial fibrillation ([place])    a. occuring in the post-operative setting in 02/2017  . HTN (hypertension)   . Hyperlipemia     Past Surgical History:  Procedure Laterality Date  . HERNIA REPAIR Right   . KNEE ARTHROSCOPY  right knee  . TOTAL KNEE ARTHROPLASTY Right 02/15/2017   Procedure: TOTAL KNEE ARTHROPLASTY;  Surgeon: Carole Civil, MD;  Location: [facility];  Service: Orthopedics;  Laterality: Right;    There were no vitals filed for this visit.   Subjective Assessment - 03/07/17 0825    Subjective  Patient reports he had his right TKA performed on 02/15/17 and has had therapy in the hospital and had home health. He was discharged from [place] this past Saturday and is ready to start with OP. He reports he is having pain and lots of swelling but has been icing and elevating his leg. He would like to be able to walk with no device and has been able to progress form the RW to the cane.    Limitations  Lifting;Standing;Walking;House hold activities    How long can you sit comfortably?   unlimited    How long can you stand comfortably?  need to lean on something like his cane, 10 mins    How long can you walk comfortably?  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  Yes    Pain Score  4     Pain Location  Knee    Pain Orientation  Right    Pain Descriptors / Indicators  Aching    Pain Type  Surgical pain    Pain Onset  1 to 4 weeks ago    Pain Frequency  Intermittent    Aggravating Factors   worse at night, wakes up at night, leg feels like it locks up at night    Pain Relieving Factors  moving it feels         [facility] PT Assessment - 03/08/17 0001      Assessment   Medical Diagnosis  Right TKA    Onset Date/Surgical Date  02/15/17    Hand Dominance  Right    Next MD Visit  4  weeks from now    Prior Therapy  ~15 years ago for Right knee scope and HH for this sx ended this past saturday      Precautions   Precautions  None      Restrictions   Weight Bearing Restrictions  No      Home Environment   Living Environment  Private residence    Living Arrangements  Spouse/significant other;Children    Available Help at Discharge  Family    Type of [place] to enter    Entrance Stairs-Number of Steps  3    Entrance Stairs-Rails  Right right going up    [place]  One level    [place] - 2 wheels;Cane - single point;Toilet riser;Shower seat    Additional Comments  16 steps in back with rails on both sides      Prior Function   Level of Independence  Independent;Independent with basic ADLs    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, want to get back to that      Cognition   Overall Cognitive Status  Within Functional Limits for tasks assessed      Observation/Other Assessments   Focus on Therapeutic Outcomes (FOTO)   49% limited       Observation/Other Assessments-Edema    Edema  Circumferential      Circumferential Edema   Circumferential - Right  42 cm    Circumferential - Left   36 cm      Functional Tests   Functional tests  Single leg stance      Single Leg Stance   Comments  Rt LE: 0 secs, Lt LE: 30 secs      AROM   Right Knee Extension  13    Right Knee Flexion  72      Strength   Right Hip Flexion  5/5    Right Hip Extension  4+/5    Right Hip ABduction  5/5    Left Hip Flexion  5/5    Left Hip Extension  5/5    Left Hip ABduction  5/5    Right Knee Flexion  4+/5    Right Knee Extension  4+/5    Left Knee Flexion  5/5    Left Knee Extension  5/5    Right Ankle Dorsiflexion  5/5    Left Ankle Dorsiflexion  5/5      Palpation   Patella mobility  -- limited assessment due to edema      Ambulation/Gait   Assistive device  Straight cane      Standardized Balance Assessment   Standardized Balance Assessment  Timed Up and Go Test;Five Times Sit to Stand    Five times sit to stand comments   18.6; patient using Bil UE and only LLE to stand      Timed Up and Go Test   TUG  Normal TUG    Normal TUG (seconds)  13.92       Objective measurements completed on examination: See above findings.    [facility] PT Treatment/Exercise - 03/08/17 0001      Ambulation/Gait   Ambulation/Gait  Yes    Ambulation/Gait Assistance  6: Modified independent (Device/Increase time)    Ambulation Distance (Feet)  286 Feet    Gait Pattern  Step-to pattern;Decreased hip/knee flexion - right;Decreased stance time - right;Decreased step length -  left;Decreased stride length;Decreased weight shift to right;Antalgic    Gait velocity  0.48 m/s    Stairs  Yes    Stairs Assistance  6: Modified independent (Device/Increase time)    Stair Management Technique  One rail Right;Step to pattern;Forwards up with left, down with right    Number of Stairs  4    Height of Stairs  6      Exercises   Exercises  Knee/Hip       Knee/Hip Exercises: Stretches   Knee: Self-Stretch to increase Flexion  10 seconds;Right 10 reps    Knee: Self-Stretch Limitations  seated asssit righ knee flexion with Lt LE      Knee/Hip Exercises: Supine   Quad Sets  Right;1 set;10 reps    Heel Slides  Right;1 set;10 reps    Heel Slides Limitations  5 second hold        PT Education - 03/07/17 1132    Education provided  Yes    Education Details  Educated on current impairments and appropriate POC. Educated on ice and elevation to manage edema. Initiated HEP and instructed in exercise form.    Person(s) Educated  Patient    Methods  Explanation;Handout    Comprehension  Verbalized understanding       PT Short Term Goals - 03/07/17 1138      PT SHORT TERM GOAL #1   Title   Patient to be compliant with correct performance of HEP, to be updated weekly      Time  3    Period  Weeks    Status  New    Target Date  03/28/17      PT SHORT TERM GOAL #2   Title  Patient will improve ROM for Rt knee extension/flexion by to 5-95 degrees to improve functional mobility with gait and stair ambulation    Time  3    Period  Weeks    Status  New      PT SHORT TERM GOAL #3   Title  Patient will perform 5x sit to stand in less than or equal to 12 seconds to improve functional LE strength and decrease fall risk     Time  3    Period  Weeks    Status  New      PT SHORT TERM GOAL #4   Title  --    Time  --    Period  --    Status  --        PT Long Term Goals - 03/08/17 1141      PT LONG TERM GOAL #1   Title  Patient will improve ROM for Rt knee extension/flexion by to 0-110 degrees to improve functional mobility with gait and stair ambulation    Time  6    Period  Weeks    Status  New    Target Date  04/18/17      PT LONG TERM GOAL #2   Title  Patient to show 5/5 MMT grade in all tested musculature in order to improve balance and performance during gait and stair ambulation     Time  6    Period  Weeks    Status  New       PT LONG TERM GOAL #3   Title  Patient will be participating in regular fitness program for strength and balance training to be independent with maintaining functional mobility and reducing pain; and to return to PLOF with coaching.  Time  6    Period  Weeks    Status  New      PT LONG TERM GOAL #4   Title   Patient will perform 5x sit to stand and TUG in less than or equal to 12 seconds with LRAD to improve functional LE strength and decrease fall risk     Time  6    Period  Weeks    Status  New         Plan - 03/08/17 1143    Clinical Impression Statement  Mr. Figler presents to initial outpatient PT evaluation following Rt TKA on 02/15/17. Patient is currently functioning below baseline and is limited by right LE edema, decreased ROM, decreased strength, decreased balance, muscle length deficits, pain, and decreased activity tolerance. 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    Clinical Presentation  Stable    Clinical Presentation due to:  FOTO, 3MWT, SLS, 5x sit to stand, TUG, clincal judgement    Clinical Decision Making  Low    Rehab Potential  Good    Clinical Impairments Affecting Rehab Potential  (+) motivated, (-) chronic knee pain    PT Frequency  3x / week    PT Duration  6 weeks    PT Treatment/Interventions  ADLs/Self Care Home Management;Electrical Stimulation;Cryotherapy;DME Instruction;Gait training;Stair training;Functional mobility training;Therapeutic activities;Therapeutic exercise;Balance training;Neuromuscular re-education;Patient/family education;Manual techniques;Passive range of motion;Scar mobilization;Energy conservation;Taping    PT Next Visit Plan  Review goals and evaluation with patient. Perform manual therapy for edema management and measure for comrpession garment. Continue ROM exercises: TKE,  lunge, quad set, heel slides.    PT Home Exercise Plan  Eval: quad set, heel slide, assisted right knee flexion stretch    Consulted and Agree with Plan of Care  Patient       Patient will benefit from skilled therapeutic intervention in order to improve the following deficits and impairments:  Abnormal gait, Decreased activity tolerance, Decreased endurance, Decreased knowledge of use of DME, Decreased range of motion, Decreased skin integrity, Decreased strength, Hypomobility, Increased fascial restricitons, Decreased balance, Decreased mobility, Decreased scar mobility, Difficulty walking, Increased edema, Pain, Impaired flexibility, Postural dysfunction  Visit Diagnosis: Acute pain of right knee  Stiffness of right knee, not elsewhere classified  Other abnormalities of gait and mobility  Other symptoms and signs involving the musculoskeletal system     Problem List Patient Active Problem List   Diagnosis Date Noted  . S/P total knee replacement, right 02/15/17 03/01/2017  . Primary osteoarthritis of both knees 10/13/2016  . Chest pain 06/06/2016  . Hyperlipemia 04/16/2013  . HTN (hypertension), benign 04/16/2013  . Impingement syndrome of right shoulder 03/08/2011    Kipp Brood, PT, DPT Physical Therapist with [facility]  03/08/2017 11:53 AM    [place], [place], [address] Phone: [phone]   Fax:  [phone]  Name: JARONE OSTERGAARD MRN: [id number] Date of Birth: [birth year]

## 2017-03-09 ENCOUNTER — Encounter (HOSPITAL_COMMUNITY): Payer: Self-pay

## 2017-03-09 ENCOUNTER — Ambulatory Visit (HOSPITAL_COMMUNITY): Payer: BLUE CROSS/BLUE SHIELD

## 2017-03-09 DIAGNOSIS — M25561 Pain in right knee: Secondary | ICD-10-CM | POA: Diagnosis not present

## 2017-03-09 DIAGNOSIS — R2689 Other abnormalities of gait and mobility: Secondary | ICD-10-CM

## 2017-03-09 DIAGNOSIS — R29898 Other symptoms and signs involving the musculoskeletal system: Secondary | ICD-10-CM

## 2017-03-09 DIAGNOSIS — M25661 Stiffness of right knee, not elsewhere classified: Secondary | ICD-10-CM

## 2017-03-09 NOTE — Therapy (Signed)
[place] [address] [place], [place], [address] Phone: [phone]   Fax:  [phone]  Physical Therapy Treatment   # OF FEET WALKED: 253ft ROM:  Flexion: 15            Extension: 71   Patient Details  Name: Christopher [name] MRN: [id number] Date of Birth: 01-Mar-1960 Referring Provider: [name]   Encounter Date: 03/09/2017  PT End of Session - 03/09/17 0812    Visit Number  2    Number of Visits  19    Date for PT Re-Evaluation  03/28/17    Authorization Type  [employer]    Authorization Time Period  03/07/17-04/18/17    PT Start Time  0817    PT Stop Time  0900    PT Time Calculation (min)  43 min    Activity Tolerance  Patient tolerated treatment well    Behavior During Therapy  [facility] for tasks assessed/performed       Past Medical History:  Diagnosis Date  . Arthritis   . Atrial fibrillation ([place])    a. occuring in the post-operative setting in 02/2017  . HTN (hypertension)   . Hyperlipemia     Past Surgical History:  Procedure Laterality Date  . HERNIA REPAIR Right   . KNEE ARTHROSCOPY  right knee  . TOTAL KNEE ARTHROPLASTY Right 02/15/2017   Procedure: TOTAL KNEE ARTHROPLASTY;  Surgeon: Carole Civil, MD;  Location: [facility];  Service: Orthopedics;  Laterality: Right;    There were no vitals filed for this visit.  Subjective Assessment - 03/09/17 0820    Subjective  Pt states that he is not too bad today. He has been compliant with his HEP.     Limitations  Lifting;Standing;Walking;House hold activities    How long can you sit comfortably?  unlimited    How long can you stand comfortably?  need to lean on something like his cane, 10 mins    How long can you walk comfortably?  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  Yes    Pain Score  4      Pain Location  Knee    Pain Orientation  Right    Pain Descriptors / Indicators  Aching    Pain Type  Surgical pain    Pain Onset  1 to 4 weeks ago    Pain Frequency  Intermittent    Aggravating Factors   worse at night, wakes up at night, leg feels like it locks up at night    Pain Relieving Factors  moving it feels           [facility] Adult PT Treatment/Exercise - 03/09/17 0001      Ambulation/Gait   Ambulation Distance (Feet)  226 Feet    Assistive device  Straight cane    Gait Pattern  Step-to pattern;Decreased hip/knee flexion - right;Decreased stance time - right;Decreased step length - left;Decreased stride length;Decreased weight shift to right;Antalgic    Gait Comments  cues for heel to toe and SPC sequencing      Knee/Hip Exercises: Stretches   Knee: Self-Stretch to increase Flexion  Right;5 reps;10 seconds    Knee: Self-Stretch Limitations  standing foot on 2nd small stair    Press photographer  Both;3 reps;30 seconds    Gastroc Stretch Limitations  slant board      Knee/Hip Exercises: Supine   Quad Sets  Right;15 reps    [employer] Limitations  5" holds    Short Arc Quad Sets  Right;15 reps    Heel Slides  Right;15 reps    Heel Slides Limitations  5 second hold    Knee Extension Limitations  15    Knee Flexion Limitations  71      Manual Therapy   Manual Therapy  Edema management    Manual therapy comments  completed separate rest of treatment    Edema Management  retro massage with BLE elevated             PT Education - 03/09/17 0813    Education provided  Yes    Education Details  reviewed initial HEP; exercise technique; compression garment information but hold off on pursuing it for right now    Person(s) Educated  Patient    Methods  Explanation;Demonstration;Handout    Comprehension  Verbalized understanding;Returned demonstration       PT Short Term Goals - 03/07/17 1138      PT SHORT TERM GOAL #1   Title   Patient to be compliant with  correct performance of HEP, to be updated weekly      Time  3    Period  Weeks    Status  New    Target Date  03/28/17      PT SHORT TERM GOAL #2   Title  Patient will improve ROM for Rt knee extension/flexion by to 5-95 degrees to improve functional mobility with gait and stair ambulation    Time  3    Period  Weeks    Status  New      PT SHORT TERM GOAL #3   Title  Patient will perform 5x sit to stand in less than or equal to 12 seconds to improve functional LE strength and decrease fall risk     Time  3    Period  Weeks    Status  New      PT SHORT TERM GOAL #4   Title  --    Time  --    Period  --    Status  --        PT Long Term Goals - 03/08/17 1141      PT LONG TERM GOAL #1   Title  Patient will improve ROM for Rt knee extension/flexion by to 0-110 degrees to improve functional mobility with gait and stair ambulation    Time  6    Period  Weeks    Status  New    Target Date  04/18/17      PT LONG TERM GOAL #2   Title  Patient to show 5/5 MMT grade in all tested musculature in order to improve balance and performance during gait and stair ambulation     Time  6    Period  Weeks    Status  New      PT LONG TERM GOAL #3   Title  Patient will be participating in regular fitness program for strength and balance training to be independent with maintaining functional mobility and reducing pain; and to return to PLOF with coaching.     Time  6    Period  Weeks    Status  New      PT LONG TERM GOAL #4  Title   Patient will perform 5x sit to stand and TUG in less than or equal to 12 seconds with LRAD to improve functional LE strength and decrease fall risk     Time  6    Period  Weeks    Status  New            Plan - 03/09/17 0945    Clinical Impression Statement  Began by reviewing initial goals and issuing copy of eval; no f/u questions. Session focused on improving mobility and decreasing edema. PT measured pt for compression garment this date and  provided pt with information of local places to obtain garment from, however, PT educated pt to hold off on pursuing this for now to assess how he responds to manual therapy first; he verbalized understanding. Pt requiring cues for proper gait sequencing with SPC. AROM 15 to 71deg this date. Pt's measurements for thigh high compression garments are as follows: mid-thigh: 18.5", largest at calf: 13.25", smallest at ankle: 7.5", shoe size: 10 mens.    Rehab Potential  Good    Clinical Impairments Affecting Rehab Potential  (+) motivated, (-) chronic knee pain    PT Frequency  3x / week    PT Duration  6 weeks    PT Treatment/Interventions  ADLs/Self Care Home Management;Electrical Stimulation;Cryotherapy;DME Instruction;Gait training;Stair training;Functional mobility training;Therapeutic activities;Therapeutic exercise;Balance training;Neuromuscular re-education;Patient/family education;Manual techniques;Passive range of motion;Scar mobilization;Energy conservation;Taping    PT Next Visit Plan  Cotninue manual therapy for edema management (referral for compression garment PRN); Continue mobility and ROM exercises add TKE, lunge;     PT Home Exercise Plan  Eval: quad set, heel slide, assisted right knee flexion stretch    Consulted and Agree with Plan of Care  Patient       Patient will benefit from skilled therapeutic intervention in order to improve the following deficits and impairments:  Abnormal gait, Decreased activity tolerance, Decreased endurance, Decreased knowledge of use of DME, Decreased range of motion, Decreased skin integrity, Decreased strength, Hypomobility, Increased fascial restricitons, Decreased balance, Decreased mobility, Decreased scar mobility, Difficulty walking, Increased edema, Pain, Impaired flexibility, Postural dysfunction  Visit Diagnosis: Acute pain of right knee  Stiffness of right knee, not elsewhere classified  Other abnormalities of gait and mobility  Other  symptoms and signs involving the musculoskeletal system     Problem List Patient Active Problem List   Diagnosis Date Noted  . S/P total knee replacement, right 02/15/17 03/01/2017  . Primary osteoarthritis of both knees 10/13/2016  . Chest pain 06/06/2016  . Hyperlipemia 04/16/2013  . HTN (hypertension), benign 04/16/2013  . Impingement syndrome of right shoulder 03/08/2011      Geraldine Solar PT, DPT  [place] [address] [place], [place], [address] Phone: [phone]   Fax:  [phone]  Name: ELYE HARMSEN MRN: [id number] Date of Birth: 11-Apr-1960

## 2017-03-11 ENCOUNTER — Ambulatory Visit (HOSPITAL_COMMUNITY): Payer: BLUE CROSS/BLUE SHIELD

## 2017-03-11 ENCOUNTER — Encounter (HOSPITAL_COMMUNITY): Payer: Self-pay

## 2017-03-11 DIAGNOSIS — M25661 Stiffness of right knee, not elsewhere classified: Secondary | ICD-10-CM

## 2017-03-11 DIAGNOSIS — R29898 Other symptoms and signs involving the musculoskeletal system: Secondary | ICD-10-CM

## 2017-03-11 DIAGNOSIS — R2689 Other abnormalities of gait and mobility: Secondary | ICD-10-CM

## 2017-03-11 DIAGNOSIS — M25561 Pain in right knee: Secondary | ICD-10-CM

## 2017-03-11 NOTE — Therapy (Signed)
[facility] [address] [place], [place], [address] Phone: [phone]   Fax:  [phone]  Physical Therapy Treatment # OF FEET WALKED: 221ft with SPC ROM:  Flexion: 74            Extension: 14  Patient Details  Name: Christopher [name] MRN: [id number] Date of Birth: 11-02-1960 Referring Provider: [name]   Encounter Date: 03/11/2017  PT End of Session - 03/11/17 0812    Visit Number  3    Number of Visits  19    Date for PT Re-Evaluation  03/28/17    Authorization Type  [employer]    Authorization Time Period  03/07/17-04/18/17    PT Start Time  0814    PT Stop Time  0855    PT Time Calculation (min)  41 min    Activity Tolerance  Patient tolerated treatment well    Behavior During Therapy  [facility] for tasks assessed/performed       Past Medical History:  Diagnosis Date  . Arthritis   . Atrial fibrillation ([place])    a. occuring in the post-operative setting in 02/2017  . HTN (hypertension)   . Hyperlipemia     Past Surgical History:  Procedure Laterality Date  . HERNIA REPAIR Right   . KNEE ARTHROSCOPY  right knee  . TOTAL KNEE ARTHROPLASTY Right 02/15/2017   Procedure: TOTAL KNEE ARTHROPLASTY;  Surgeon: Carole Civil, MD;  Location: [facility];  Service: Orthopedics;  Laterality: Right;    There were no vitals filed for this visit.  Subjective Assessment - 03/11/17 0812    Subjective  Pt states that he is working hard on trying to bend his knee.     Limitations  Lifting;Standing;Walking;House hold activities    How long can you sit comfortably?  unlimited    How long can you stand comfortably?  need to lean on something like his cane, 10 mins    How long can you walk comfortably?  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  Yes    Pain Score  -- 3.5    Pain  Location  Knee    Pain Orientation  Right    Pain Descriptors / Indicators  Aching    Pain Type  Surgical pain    Pain Onset  1 to 4 weeks ago    Pain Frequency  Intermittent    Aggravating Factors   worse at night, wakes up at night, leg feels like it locks up at night    Pain Relieving Factors  moving it feels better.            [facility] Adult PT Treatment/Exercise - 03/11/17 0001      Knee/Hip Exercises: Stretches   Passive Hamstring Stretch  Right;3 reps;30 seconds    Passive Hamstring Stretch Limitations  standing RLE on 8" step    Knee: Self-Stretch to increase Flexion  Right    Knee: Self-Stretch Limitations  10x10" on 8" step    Gastroc Stretch  Both;3 reps;30 seconds    Gastroc Stretch Limitations  slant board      Knee/Hip Exercises: Standing   Terminal Knee Extension  Right;Theraband    Theraband Level (Terminal Knee Extension)  Level 3 (Green)    Terminal Knee Extension  Limitations  10x10"    Rocker Board  2 minutes R/L      Knee/Hip Exercises: Supine   Quad Sets  Right;15 reps    [employer] Limitations  5" holds    Short Arc [employer]  Right;15 reps    Short Arc Quad Sets Limitations  3" holds    Heel Slides  Right;15 reps    Heel Slides Limitations  5 second hold    Knee Extension Limitations  14    Knee Flexion Limitations  75      Manual Therapy   Manual Therapy  Edema management    Manual therapy comments  completed separate rest of treatment    Edema Management  retro massage with BLE elevated            PT Education - 03/11/17 [phone]    Education provided  Yes    Education Details  exercise technique; added SAQ to HEP; rest with feet elevated, nothing under knee, only under ankle    Person(s) Educated  Patient    Methods  Explanation;Demonstration;Handout    Comprehension  Verbalized understanding;Returned demonstration       PT Short Term Goals - 03/07/17 1138      PT SHORT TERM GOAL #1   Title   Patient to be compliant with correct  performance of HEP, to be updated weekly      Time  3    Period  Weeks    Status  New    Target Date  03/28/17      PT SHORT TERM GOAL #2   Title  Patient will improve ROM for Rt knee extension/flexion by to 5-95 degrees to improve functional mobility with gait and stair ambulation    Time  3    Period  Weeks    Status  New      PT SHORT TERM GOAL #3   Title  Patient will perform 5x sit to stand in less than or equal to 12 seconds to improve functional LE strength and decrease fall risk     Time  3    Period  Weeks    Status  New      PT SHORT TERM GOAL #4   Title  --    Time  --    Period  --    Status  --        PT Long Term Goals - 03/08/17 1141      PT LONG TERM GOAL #1   Title  Patient will improve ROM for Rt knee extension/flexion by to 0-110 degrees to improve functional mobility with gait and stair ambulation    Time  6    Period  Weeks    Status  New    Target Date  04/18/17      PT LONG TERM GOAL #2   Title  Patient to show 5/5 MMT grade in all tested musculature in order to improve balance and performance during gait and stair ambulation     Time  6    Period  Weeks    Status  New      PT LONG TERM GOAL #3   Title  Patient will be participating in regular fitness program for strength and balance training to be independent with maintaining functional mobility and reducing pain; and to return to PLOF with coaching.     Time  6    Period  Weeks    Status  New  PT LONG TERM GOAL #4   Title   Patient will perform 5x sit to stand and TUG in less than or equal to 12 seconds with LRAD to improve functional LE strength and decrease fall risk     Time  6    Period  Weeks    Status  New            Plan - 03/11/17 5427    Clinical Impression Statement  Continued focusing on mobility and edema management. Added standing HS stretch on step, TKE, and rockerboard this date. Pt's gait much improved from last visit as he demo'd good sequencing with SPC and  improved heel strike; PT did cue pt to increased pressure through [facility] to help decrease pt's lateral trunk lean over RLE. Ended with manual to address edema. Pt's AROM 14 to 74deg this date.  Educated pt on self-scar mobilization.    Rehab Potential  Good    Clinical Impairments Affecting Rehab Potential  (+) motivated, (-) chronic knee pain    PT Frequency  3x / week    PT Duration  6 weeks    PT Treatment/Interventions  ADLs/Self Care Home Management;Electrical Stimulation;Cryotherapy;DME Instruction;Gait training;Stair training;Functional mobility training;Therapeutic activities;Therapeutic exercise;Balance training;Neuromuscular re-education;Patient/family education;Manual techniques;Passive range of motion;Scar mobilization;Energy conservation;Taping    PT Next Visit Plan  add bike for ROM; Cotninue manual therapy for edema management (referral for compression garment PRN); Continue mobility and ROM exercises; continue TKE and rockerboard    PT Home Exercise Plan  Eval: quad set, heel slide, assisted right knee flexion stretch; 2/8: SAQ, scar mobilization    Consulted and Agree with Plan of Care  Patient       Patient will benefit from skilled therapeutic intervention in order to improve the following deficits and impairments:  Abnormal gait, Decreased activity tolerance, Decreased endurance, Decreased knowledge of use of DME, Decreased range of motion, Decreased skin integrity, Decreased strength, Hypomobility, Increased fascial restricitons, Decreased balance, Decreased mobility, Decreased scar mobility, Difficulty walking, Increased edema, Pain, Impaired flexibility, Postural dysfunction  Visit Diagnosis: Acute pain of right knee  Stiffness of right knee, not elsewhere classified  Other abnormalities of gait and mobility  Other symptoms and signs involving the musculoskeletal system     Problem List Patient Active Problem List   Diagnosis Date Noted  . S/P total knee replacement,  right 02/15/17 03/01/2017  . Primary osteoarthritis of both knees 10/13/2016  . Chest pain 06/06/2016  . Hyperlipemia 04/16/2013  . HTN (hypertension), benign 04/16/2013  . Impingement syndrome of right shoulder 03/08/2011      Geraldine Solar PT, DPT  [facility] [address] [place], [place], [address] Phone: [phone]   Fax:  [phone]  Name: Christopher [name] MRN: [id number] Date of Birth: [birth year]

## 2017-03-11 NOTE — Patient Instructions (Signed)
  Short Arc Quads (SAQs)  While lying face up, place two rolled towels under the knee to lift it about 5 inches off the mat. Bring your toes towards your head (dorsiflexion)then straighten the knee for a hold of 3-5 seconds. Slowly lower your leg back to the mat.   Perform 2x/day, 2-3 sets of 10-15 reps holding for 3-5 seconds at the top.

## 2017-03-14 ENCOUNTER — Other Ambulatory Visit: Payer: Self-pay

## 2017-03-14 ENCOUNTER — Telehealth: Payer: Self-pay | Admitting: Orthopedic Surgery

## 2017-03-14 ENCOUNTER — Encounter (HOSPITAL_COMMUNITY): Payer: Self-pay

## 2017-03-14 ENCOUNTER — Other Ambulatory Visit: Payer: Self-pay | Admitting: Family Medicine

## 2017-03-14 ENCOUNTER — Ambulatory Visit (HOSPITAL_COMMUNITY): Payer: BLUE CROSS/BLUE SHIELD

## 2017-03-14 DIAGNOSIS — R29898 Other symptoms and signs involving the musculoskeletal system: Secondary | ICD-10-CM

## 2017-03-14 DIAGNOSIS — R2689 Other abnormalities of gait and mobility: Secondary | ICD-10-CM

## 2017-03-14 DIAGNOSIS — M25561 Pain in right knee: Secondary | ICD-10-CM | POA: Diagnosis not present

## 2017-03-14 DIAGNOSIS — M25661 Stiffness of right knee, not elsewhere classified: Secondary | ICD-10-CM

## 2017-03-14 NOTE — Therapy (Signed)
Christopher [name], [place], [address] Phone: [phone]   Fax:  [phone]  Physical Therapy Treatment  Patient Details  Name: Christopher [name] MRN: [id number] Date of Birth: 1960/07/12 Referring Provider: [name]   Encounter Date: 03/14/2017  PT End of Session - 03/14/17 0816    Visit Number  4    Number of Visits  19    Date for PT Re-Evaluation  03/28/17    Authorization Type  [employer]    Authorization Time Period  03/07/17-04/18/17    PT Start Time  0816    PT Stop Time  0901    PT Time Calculation (min)  45 min    Activity Tolerance  Patient tolerated treatment well;No increased pain    Behavior During Therapy  WFL for tasks assessed/performed       Past Medical History:  Diagnosis Date  . Arthritis   . Atrial fibrillation ([place])    a. occuring in the post-operative setting in 02/2017  . HTN (hypertension)   . Hyperlipemia     Past Surgical History:  Procedure Laterality Date  . HERNIA REPAIR Right   . KNEE ARTHROSCOPY  right knee  . TOTAL KNEE ARTHROPLASTY Right 02/15/2017   Procedure: TOTAL KNEE ARTHROPLASTY;  Surgeon: Carole Civil, MD;  Location: [facility];  Service: Orthopedics;  Laterality: Right;    There were no vitals filed for this visit.  Subjective Assessment - 03/14/17 0820    Subjective  Patient has not been using his pain medicine anymore doesn't feel that he needs it now. His pain was alright this weekend and he was able to perform his HEP at least 1x perday usually more. He states he thinks his swelling has gone down some since last session.    Limitations  Lifting;Standing;Walking;House hold activities    How long can you sit comfortably?  unlimited    How long can you stand comfortably?  need to lean on something like his cane, 10 mins    How long can you walk comfortably?  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  Yes    Pain Score  3  3.5    Pain Location  Knee    Pain Orientation  Right    Pain Descriptors / Indicators  Aching    Pain Type  Surgical pain    Pain Frequency  Intermittent    Aggravating Factors   states nightime is worse, his right knee feels like it gets stiff, he states he gets up at night to exercise to make it feel better to go back to sleep     Pain Relieving Factors  movig helps it feel better        [facility] PT Assessment - 03/14/17 0001      Circumferential Edema   Circumferential - Right  39 was 42 cm on 03/07/17    Circumferential - Left   36 cm       [facility] Adult PT Treatment/Exercise - 03/14/17 0001      Knee/Hip Exercises: Stretches   Passive Hamstring Stretch  Right;3 reps;30 seconds    Passive Hamstring Stretch Limitations  12" box    Knee: Self-Stretch to increase Flexion  Right    Knee: Self-Stretch Limitations  10x10" on 12" step    Gastroc Stretch  Both;3 reps;30 seconds    Gastroc Stretch Limitations  slant board      Knee/Hip Exercises: Aerobic   Nustep  4.5 minutes on level 1; seat 11, BLE (no UE)      Knee/Hip Exercises: Standing   Terminal Knee Extension  Right;Theraband    Theraband Level (Terminal Knee Extension)  Level 3 (Green)    Terminal Knee Extension Limitations  15x10"    Rocker Board  2 minutes 2x 1 minutes lateral      Knee/Hip Exercises: Supine   Quad Sets  Right;15 reps    Quad Sets Limitations  5" holds    Heel Slides  Right;15 reps    Heel Slides Limitations  5 second hold    Knee Extension Limitations  14    Knee Flexion Limitations  75      Manual Therapy   Manual Therapy  Edema management;Joint mobilization    Manual therapy comments  completed separate rest of treatment    Edema Management  retro massage with BLE elevated       PT Education - 03/14/17 0827    Education provided  Yes    Education Details  educated on  exercise technique and edema management    Person(s) Educated  Patient    Methods  Explanation    Comprehension  Verbalized understanding;Returned demonstration       PT Short Term Goals - 03/07/17 1138      PT SHORT TERM GOAL #1   Title   Patient to be compliant with correct performance of HEP, to be updated weekly      Time  3    Period  Weeks    Status  New    Target Date  03/28/17      PT SHORT TERM GOAL #2   Title  Patient will improve ROM for Rt knee extension/flexion by to 5-95 degrees to improve functional mobility with gait and stair ambulation    Time  3    Period  Weeks    Status  New      PT SHORT TERM GOAL #3   Title  Patient will perform 5x sit to stand in less than or equal to 12 seconds to improve functional LE strength and decrease fall risk     Time  3    Period  Weeks    Status  New      PT SHORT TERM GOAL #4   Title  --    Time  --    Period  --    Status  --        PT Long Term Goals - 03/08/17 1141      PT LONG TERM GOAL #1   Title  Patient will improve ROM for Rt knee extension/flexion by to 0-110 degrees to improve functional mobility with gait and stair ambulation    Time  6    Period  Weeks    Status  New    Target Date  04/18/17      PT LONG TERM GOAL #2   Title  Patient to show 5/5 MMT grade in all tested musculature in order to improve balance and performance during gait and stair ambulation     Time  6    Period  Weeks    Status  New      PT LONG TERM GOAL #3   Title  Patient will be participating in  regular fitness program for strength and balance training to be independent with maintaining functional mobility and reducing pain; and to return to PLOF with coaching.     Time  6    Period  Weeks    Status  New      PT LONG TERM GOAL #4   Title   Patient will perform 5x sit to stand and TUG in less than or equal to 12 seconds with LRAD to improve functional LE strength and decrease fall risk     Time  6    Period  Weeks     Status  New         Plan - 03/14/17 4742    Clinical Impression Statement  Patient is progressing well in therapy and remains limited by decreased ROM into flexion and extension of the Rt knee. He continued with standing stretches today and initiated NuStep for warm up to improve muscle elasticity before stretches. Swelling has improved from 42 cm on 03/07/17 to 39 cm around right knee today. Patient instructed to continue elevating and icing to manage swelling and educated to hold off on compression garment at this time as prior methods are effective. Patient's AROM remains limited from 14*-75* and he my benefit from joint mobilization next session. He will continue to benefit skilled PT services to address current impairments and progress towards goals to achieve goals, reduce pain, and improve QOL.    Rehab Potential  Good    Clinical Impairments Affecting Rehab Potential  (+) motivated, (-) chronic knee pain    PT Frequency  3x / week    PT Duration  6 weeks    PT Treatment/Interventions  ADLs/Self Care Home Management;Electrical Stimulation;Cryotherapy;DME Instruction;Gait training;Stair training;Functional mobility training;Therapeutic activities;Therapeutic exercise;Balance training;Neuromuscular re-education;Patient/family education;Manual techniques;Passive range of motion;Scar mobilization;Energy conservation;Taping    PT Next Visit Plan  Continue with NuStep for ROM; Continue manual therapy for edema management and measure swelling. Initiate joint mobilzation for flexion/extension of Rt knee. Continue mobility and ROM exercises; continue TKE and rockerboard. Add TKE and low load long duration stretch next session for HEP.    PT Home Exercise Plan  Eval: quad set, heel slide, assisted right knee flexion stretch; 2/8: SAQ, scar mobilization    Consulted and Agree with Plan of Care  Patient       Patient will benefit from skilled therapeutic intervention in order to improve the following  deficits and impairments:  Abnormal gait, Decreased activity tolerance, Decreased endurance, Decreased knowledge of use of DME, Decreased range of motion, Decreased skin integrity, Decreased strength, Hypomobility, Increased fascial restricitons, Decreased balance, Decreased mobility, Decreased scar mobility, Difficulty walking, Increased edema, Pain, Impaired flexibility, Postural dysfunction  Visit Diagnosis: Acute pain of right knee  Stiffness of right knee, not elsewhere classified  Other abnormalities of gait and mobility  Other symptoms and signs involving the musculoskeletal system     Problem List Patient Active Problem List   Diagnosis Date Noted  . S/P total knee replacement, right 02/15/17 03/01/2017  . Primary osteoarthritis of both knees 10/13/2016  . Chest pain 06/06/2016  . Hyperlipemia 04/16/2013  . HTN (hypertension), benign 04/16/2013  . Impingement syndrome of right shoulder 03/08/2011    Christopher [name], PT, DPT Physical Therapist with [facility]  03/14/2017 9:06 AM    [facility] [address] [place], [place], [address] Phone: [phone]   Fax:  [phone]  Name: Christopher [name] MRN: [id number] Date of  Birth: [birth year]

## 2017-03-14 NOTE — Telephone Encounter (Signed)
Patient called for refill: (aware that while Dr Aline Brochure is out of office this week, requests routed to Dr Luna Glasgow: HYDROcodone-acetaminophen ([place]) 7.5-325 MG tablet 42 tablet    [employer]

## 2017-03-15 MED ORDER — HYDROCODONE-ACETAMINOPHEN 7.5-325 MG PO TABS: 1.0000 | ORAL_TABLET | Freq: Four times a day (QID) | ORAL | 0 refills | Status: DC

## 2017-03-16 ENCOUNTER — Encounter (HOSPITAL_COMMUNITY): Payer: Self-pay

## 2017-03-16 ENCOUNTER — Ambulatory Visit (HOSPITAL_COMMUNITY): Payer: BLUE CROSS/BLUE SHIELD

## 2017-03-16 ENCOUNTER — Other Ambulatory Visit: Payer: Self-pay

## 2017-03-16 DIAGNOSIS — M25561 Pain in right knee: Secondary | ICD-10-CM

## 2017-03-16 DIAGNOSIS — M25661 Stiffness of right knee, not elsewhere classified: Secondary | ICD-10-CM

## 2017-03-16 DIAGNOSIS — R2689 Other abnormalities of gait and mobility: Secondary | ICD-10-CM

## 2017-03-16 DIAGNOSIS — R29898 Other symptoms and signs involving the musculoskeletal system: Secondary | ICD-10-CM

## 2017-03-16 NOTE — Therapy (Signed)
[place], [place], [address] Phone: [phone]   Fax:  [phone]  Physical Therapy Treatment  Patient Details  Name: Christopher [name] MRN: [id number] Date of Birth: 08/29/1960 Referring Provider: [name]   Encounter Date: 03/16/2017  PT End of Session - 03/16/17 0824    Visit Number  5    Number of Visits  19    Date for PT Re-Evaluation  03/28/17    Authorization Type  [employer]    Authorization Time Period  03/07/17-04/18/17    PT Start Time  [phone]    PT Stop Time  0900    PT Time Calculation (min)  43 min    Activity Tolerance  Patient tolerated treatment well;No increased pain    Behavior During Therapy  WFL for tasks assessed/performed       Past Medical History:  Diagnosis Date  . Arthritis   . Atrial fibrillation ([place])    a. occuring in the post-operative setting in 02/2017  . HTN (hypertension)   . Hyperlipemia     Past Surgical History:  Procedure Laterality Date  . HERNIA REPAIR Right   . KNEE ARTHROSCOPY  right knee  . TOTAL KNEE ARTHROPLASTY Right 02/15/2017   Procedure: TOTAL KNEE ARTHROPLASTY;  Surgeon: Carole Civil, MD;  Location: [facility];  Service: Orthopedics;  Laterality: Right;    There were no vitals filed for this visit.  Subjective Assessment - 03/16/17 0820    Subjective  Patient has been doing HEP every day and has minimal pain. He reports his knee feels tighter than normal today. He sometimes does his exercises when he wakes up around 5 AM but held off because of the stiffness.    Limitations  Lifting;Standing;Walking;House hold activities    How long can you sit comfortably?  unlimited    How long can you stand comfortably?  need to lean on something like his cane, 10 mins    How long can you walk comfortably?  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  Yes    Pain Score  1     Pain Location  Knee    Pain Orientation  Right    Pain Descriptors / Indicators  Tightness stiff    Pain Type  Surgical pain    Pain Frequency  Intermittent    Aggravating Factors   stiff when he first gets up    Pain Relieving Factors  moving it helps    Effect of Pain on Daily Activities  difficulty waling but improved since before surgery       [facility] Adult PT Treatment/Exercise - 03/16/17 0001      Knee/Hip Exercises: Stretches   Passive Hamstring Stretch  Right;3 reps;30 seconds    Passive Hamstring Stretch Limitations  12" box    Knee: Self-Stretch to increase Flexion  Right;3 reps;30 seconds    Knee: Self-Stretch Limitations  12" step      Knee/Hip Exercises: Aerobic   Nustep  4.5 minutes on level 1; seat 12, BLE (no UE)      Knee/Hip Exercises: Standing   Forward Lunges  1 set;10 reps;Limitations;Right    Forward Lunges Limitations  10 second; movement with mobilization for AP glide for knee flexion, grade III/IV  Terminal Knee Extension  Right;Theraband    Theraband Level (Terminal Knee Extension)  Level 4 (Blue)    Terminal Knee Extension Limitations  15x10"    Rocker Board  --      Knee/Hip Exercises: Supine   Quad Sets  Right;15 reps    Quad Sets Limitations  5" holds    Heel Slides  Right;15 reps    Heel Slides Limitations  5 second hold      Manual Therapy   Manual Therapy  Joint mobilization    Manual therapy comments  completed separate rest of treatment    Joint Mobilization  AP/PA glide to tbibiofemoral joint for flexion/extension, grade III for 3x 45 seconds        PT Education - 03/16/17 0823    Education provided  Yes    Education Details  Educated on exercise form/technique and updated HEP.    Person(s) Educated  Patient    Methods  Explanation;Handout    Comprehension  Verbalized understanding       PT Short Term Goals - 03/07/17 1138      PT SHORT TERM  GOAL #1   Title   Patient to be compliant with correct performance of HEP, to be updated weekly      Time  3    Period  Weeks    Status  New    Target Date  03/28/17      PT SHORT TERM GOAL #2   Title  Patient will improve ROM for Rt knee extension/flexion by to 5-95 degrees to improve functional mobility with gait and stair ambulation    Time  3    Period  Weeks    Status  New      PT SHORT TERM GOAL #3   Title  Patient will perform 5x sit to stand in less than or equal to 12 seconds to improve functional LE strength and decrease fall risk     Time  3    Period  Weeks    Status  New      PT SHORT TERM GOAL #4   Title  --    Time  --    Period  --    Status  --        PT Long Term Goals - 03/08/17 1141      PT LONG TERM GOAL #1   Title  Patient will improve ROM for Rt knee extension/flexion by to 0-110 degrees to improve functional mobility with gait and stair ambulation    Time  6    Period  Weeks    Status  New    Target Date  04/18/17      PT LONG TERM GOAL #2   Title  Patient to show 5/5 MMT grade in all tested musculature in order to improve balance and performance during gait and stair ambulation     Time  6    Period  Weeks    Status  New      PT LONG TERM GOAL #3   Title  Patient will be participating in regular fitness program for strength and balance training to be independent with maintaining functional mobility and reducing pain; and to return to PLOF with coaching.     Time  6    Period  Weeks    Status  New      PT LONG TERM GOAL #4   Title   Patient will perform 5x sit to stand and TUG  in less than or equal to 12 seconds with LRAD to improve functional LE strength and decrease fall risk     Time  6    Period  Weeks    Status  New        Plan - 03/16/17 5027    Clinical Impression Statement  Patient continues to progress in therapy but remains limited by decreased ROM into flexion and extension of the Rt knee. He continued with the NuStep for  ROM today and had a positive response reporting decreased stiffness and demonstrating improved step count throughout the exercise. Swelling continues to improve and patient reports good compliance with elevating and icing. His incision is healing well and has improved elasticity of the skin following education on scar massage las session. He advanced functional strengthening with no increase in pain and reported decreased stiffness following joint mobilization for right knee flexion/extension. He will continue to benefit skilled PT services to address current impairments and progress towards goals to achieve goals, reduce pain, and improve QOL    Rehab Potential  Good    Clinical Impairments Affecting Rehab Potential  (+) motivated, (-) chronic knee pain    PT Frequency  3x / week    PT Duration  6 weeks    PT Treatment/Interventions  ADLs/Self Care Home Management;Electrical Stimulation;Cryotherapy;DME Instruction;Gait training;Stair training;Functional mobility training;Therapeutic activities;Therapeutic exercise;Balance training;Neuromuscular re-education;Patient/family education;Manual techniques;Passive range of motion;Scar mobilization;Energy conservation;Taping    PT Next Visit Plan  Continue with NuStep for ROM; Continue joint mobilzation for flexion/extension of Rt knee; perform soft tissuemobilizaiton to hamstringa nd quads for soft tissue restrictions. Continue mobility and ROM exercises; continue TKE and rockerboard. Add TKE and low load long duration stretch next session for HEP.    PT Home Exercise Plan  Eval: quad set, heel slide, assisted right knee flexion stretch; 2/8: SAQ, scar mobilization; 03/16/17 - TKE with green TB, low load long duration stretch    Consulted and Agree with Plan of Care  Patient       Patient will benefit from skilled therapeutic intervention in order to improve the following deficits and impairments:  Abnormal gait, Decreased activity tolerance, Decreased  endurance, Decreased knowledge of use of DME, Decreased range of motion, Decreased skin integrity, Decreased strength, Hypomobility, Increased fascial restricitons, Decreased balance, Decreased mobility, Decreased scar mobility, Difficulty walking, Increased edema, Pain, Impaired flexibility, Postural dysfunction  Visit Diagnosis: Acute pain of right knee  Stiffness of right knee, not elsewhere classified  Other abnormalities of gait and mobility  Other symptoms and signs involving the musculoskeletal system     Problem List Patient Active Problem List   Diagnosis Date Noted  . S/P total knee replacement, right 02/15/17 03/01/2017  . Primary osteoarthritis of both knees 10/13/2016  . Chest pain 06/06/2016  . Hyperlipemia 04/16/2013  . HTN (hypertension), benign 04/16/2013  . Impingement syndrome of right shoulder 03/08/2011    Kipp Brood, PT, DPT Physical Therapist with [facility]  03/16/2017 9:44 AM    [place], [place], [address] Phone: [phone]   Fax:  [phone]  Name: Christopher [name] MRN: [id number] Date of Birth: 1960/09/03

## 2017-03-16 NOTE — Patient Instructions (Signed)
   TERMINAL KNEE EXTENSION - TKE: 1-2 sets of 15 repetitions  Start in a standing position with an elastic band attached above your knee and the other end tied with a knot and fixated behind a closed door or other anchor. The target knee should be partially bent with your toes  touching the ground.  Next, move your knee back towards a straightened position so that your heel touches the floor and you pull against the band.      KNEE EXTENSION STRETCH - PROPPED AND WEIGHTED: 10-15 minutes (place 5 lbs in a bag)  While seated, prop your foot up on another chair and allow gravity to stretch your knee towards a more straightened position. Place a weight such as a back pack, ankle weight or other item on the knee for an increased stretch.

## 2017-03-18 ENCOUNTER — Ambulatory Visit (HOSPITAL_COMMUNITY): Payer: BLUE CROSS/BLUE SHIELD | Admitting: Physical Therapy

## 2017-03-18 ENCOUNTER — Encounter (HOSPITAL_COMMUNITY): Payer: Self-pay | Admitting: Physical Therapy

## 2017-03-18 DIAGNOSIS — R2689 Other abnormalities of gait and mobility: Secondary | ICD-10-CM

## 2017-03-18 DIAGNOSIS — M25561 Pain in right knee: Secondary | ICD-10-CM

## 2017-03-18 DIAGNOSIS — R29898 Other symptoms and signs involving the musculoskeletal system: Secondary | ICD-10-CM

## 2017-03-18 DIAGNOSIS — M25661 Stiffness of right knee, not elsewhere classified: Secondary | ICD-10-CM

## 2017-03-18 NOTE — Therapy (Signed)
[place], [place], [address] Phone: [phone]   Fax:  [phone]  Physical Therapy Treatment  Patient Details  Name: Christopher [name] MRN: [id number] Date of Birth: October 12, 1960 Referring Provider: [name]   Encounter Date: 03/18/2017  PT End of Session - 03/18/17 0837    Visit Number  6    Number of Visits  19    Date for PT Re-Evaluation  03/28/17    Authorization Type  [employer]    Authorization Time Period  03/07/17-04/18/17    PT Start Time  0817    PT Stop Time  0859    PT Time Calculation (min)  42 min    Activity Tolerance  Patient tolerated treatment well;No increased pain    Behavior During Therapy  WFL for tasks assessed/performed       Past Medical History:  Diagnosis Date  . Arthritis   . Atrial fibrillation ([place])    a. occuring in the post-operative setting in 02/2017  . HTN (hypertension)   . Hyperlipemia     Past Surgical History:  Procedure Laterality Date  . HERNIA REPAIR Right   . KNEE ARTHROSCOPY  right knee  . TOTAL KNEE ARTHROPLASTY Right 02/15/2017   Procedure: TOTAL KNEE ARTHROPLASTY;  Surgeon: Carole Civil, MD;  Location: [facility];  Service: Orthopedics;  Laterality: Right;    There were no vitals filed for this visit.  Subjective Assessment - 03/18/17 0834    Subjective  Patient reported that he has been doing his HEP every day. He stated that he has some achiness in his knee today, but it is not that bad.     Currently in Pain?  Yes    Pain Score  3     Pain Location  Knee    Pain Orientation  Right    Pain Descriptors / Indicators  Aching    Pain Type  Surgical pain    Pain Onset  1 to 4 weeks ago                      [facility] Adult PT Treatment/Exercise - 03/18/17 0001      Knee/Hip Exercises: Stretches   Passive Hamstring Stretch  Right;3 reps;30 seconds    Passive Hamstring Stretch Limitations  12" box    Knee: Self-Stretch  to increase Flexion  Right;3 reps;30 seconds    Knee: Self-Stretch Limitations  12" step    Gastroc Stretch  Both;3 reps;30 seconds Verbal cues to lean forward      Knee/Hip Exercises: Standing   Forward Lunges  1 set;10 reps;Limitations;Right    Forward Lunges Limitations  10 second holds    Terminal Knee Extension  Right;Theraband    Theraband Level (Terminal Knee Extension)  Level 4 (Blue)    Terminal Knee Extension Limitations  15x10"    Rocker Board  2 minutes      Knee/Hip Exercises: Supine   Quad Sets  Right;15 reps    Quad Sets Limitations  5 second holds    Heel Slides  Right;15 reps    Heel Slides Limitations  5 second hold      Manual Therapy   Manual Therapy  Joint mobilization;Soft tissue mobilization    Manual therapy comments  15 minutes total completed separate rest of treatment    Joint Mobilization  AP/PA glide to tbibiofemoral joint for flexion/extension, grade III for 3x 45 seconds    Soft  tissue mobilization  Soft tissue mobilzation to right hamstrings and quadriceps to promote relaxation, decrease restrictions, and decrease pain             PT Education - 03/18/17 0836    Education provided  Yes    Education Details  Patient was educated on purpose and technique of interventions throughout session.    Person(s) Educated  Patient    Methods  Explanation    Comprehension  Verbalized understanding       PT Short Term Goals - 03/07/17 1138      PT SHORT TERM GOAL #1   Title   Patient to be compliant with correct performance of HEP, to be updated weekly      Time  3    Period  Weeks    Status  New    Target Date  03/28/17      PT SHORT TERM GOAL #2   Title  Patient will improve ROM for Rt knee extension/flexion by to 5-95 degrees to improve functional mobility with gait and stair ambulation    Time  3    Period  Weeks    Status  New      PT SHORT TERM GOAL #3   Title  Patient will perform 5x sit to stand in less than or equal to 12 seconds to  improve functional LE strength and decrease fall risk     Time  3    Period  Weeks    Status  New      PT SHORT TERM GOAL #4   Title  --    Time  --    Period  --    Status  --        PT Long Term Goals - 03/08/17 1141      PT LONG TERM GOAL #1   Title  Patient will improve ROM for Rt knee extension/flexion by to 0-110 degrees to improve functional mobility with gait and stair ambulation    Time  6    Period  Weeks    Status  New    Target Date  04/18/17      PT LONG TERM GOAL #2   Title  Patient to show 5/5 MMT grade in all tested musculature in order to improve balance and performance during gait and stair ambulation     Time  6    Period  Weeks    Status  New      PT LONG TERM GOAL #3   Title  Patient will be participating in regular fitness program for strength and balance training to be independent with maintaining functional mobility and reducing pain; and to return to PLOF with coaching.     Time  6    Period  Weeks    Status  New      PT LONG TERM GOAL #4   Title   Patient will perform 5x sit to stand and TUG in less than or equal to 12 seconds with LRAD to improve functional LE strength and decrease fall risk     Time  6    Period  Weeks    Status  New            Plan - 03/18/17 6440    Clinical Impression Statement  Today's session began with manual therapy to improve soft tissue relaxation and mobility in the muscles surrounding the right knee as well as joint mobility with joint glides to the right tibiofemoral  joint. Therapist noted particular tightness in lateral hamstring muscle so more time was spent in that area. Then the session progressed to patient performing supine exercises, and then finished with standing exercises for improved mobility and improved lower extremity strength. Patient is still limited in right knee flexion and extension range of motion. Patient would benefit from continued skilled physical therapy in order to continue to address  deficits in range of motion, strength, and overall functional mobility.     Rehab Potential  Good    Clinical Impairments Affecting Rehab Potential  (+) motivated, (-) chronic knee pain    PT Frequency  3x / week    PT Duration  6 weeks    PT Treatment/Interventions  ADLs/Self Care Home Management;Electrical Stimulation;Cryotherapy;DME Instruction;Gait training;Stair training;Functional mobility training;Therapeutic activities;Therapeutic exercise;Balance training;Neuromuscular re-education;Patient/family education;Manual techniques;Passive range of motion;Scar mobilization;Energy conservation;Taping    PT Next Visit Plan  Continue with NuStep for ROM; Continue joint mobilzation for flexion/extension of Rt knee; perform soft tissuemobilizaiton to hamstring and quads for soft tissue restrictions. Continue mobility and ROM exercises; continue TKE and rockerboard.     PT Home Exercise Plan  Eval: quad set, heel slide, assisted right knee flexion stretch; 2/8: SAQ, scar mobilization; 03/16/17 - TKE with green TB, low load long duration stretch    Consulted and Agree with Plan of Care  Patient       Patient will benefit from skilled therapeutic intervention in order to improve the following deficits and impairments:  Abnormal gait, Decreased activity tolerance, Decreased endurance, Decreased knowledge of use of DME, Decreased range of motion, Decreased skin integrity, Decreased strength, Hypomobility, Increased fascial restricitons, Decreased balance, Decreased mobility, Decreased scar mobility, Difficulty walking, Increased edema, Pain, Impaired flexibility, Postural dysfunction  Visit Diagnosis: Acute pain of right knee  Stiffness of right knee, not elsewhere classified  Other abnormalities of gait and mobility  Other symptoms and signs involving the musculoskeletal system     Problem List Patient Active Problem List   Diagnosis Date Noted  . S/P total knee replacement, right 02/15/17  03/01/2017  . Primary osteoarthritis of both knees 10/13/2016  . Chest pain 06/06/2016  . Hyperlipemia 04/16/2013  . HTN (hypertension), benign 04/16/2013  . Impingement syndrome of right shoulder 03/08/2011    Clarene Critchley PT, DPT 9:02 AM, 03/18/17 [place], [place], [address] Phone: [phone]   Fax:  [phone]  Name: Christopher [name] MRN: [id number] Date of Birth: 03/26/1960

## 2017-03-21 ENCOUNTER — Ambulatory Visit (HOSPITAL_COMMUNITY): Payer: BLUE CROSS/BLUE SHIELD

## 2017-03-21 ENCOUNTER — Other Ambulatory Visit: Payer: Self-pay

## 2017-03-21 ENCOUNTER — Encounter (HOSPITAL_COMMUNITY): Payer: Self-pay

## 2017-03-21 DIAGNOSIS — M25661 Stiffness of right knee, not elsewhere classified: Secondary | ICD-10-CM

## 2017-03-21 DIAGNOSIS — R2689 Other abnormalities of gait and mobility: Secondary | ICD-10-CM

## 2017-03-21 DIAGNOSIS — M25561 Pain in right knee: Secondary | ICD-10-CM | POA: Diagnosis not present

## 2017-03-21 DIAGNOSIS — R29898 Other symptoms and signs involving the musculoskeletal system: Secondary | ICD-10-CM

## 2017-03-21 NOTE — Therapy (Signed)
[place], [place], [address] Phone: [phone]   Fax:  [phone]  Physical Therapy Treatment  Patient Details  Name: Christopher [name] MRN: [id number] Date of Birth: February 06, 1960 Referring Provider: [name]   Encounter Date: 03/21/2017  PT End of Session - 03/21/17 0822    Visit Number  7    Number of Visits  19    Date for PT Re-Evaluation  03/28/17    Authorization Type  [employer]    Authorization Time Period  03/07/17-04/18/17    PT Start Time  [phone]    PT Stop Time  0900    PT Time Calculation (min)  43 min    Activity Tolerance  Patient tolerated treatment well;No increased pain    Behavior During Therapy  WFL for tasks assessed/performed       Past Medical History:  Diagnosis Date  . Arthritis   . Atrial fibrillation ([place])    a. occuring in the post-operative setting in 02/2017  . HTN (hypertension)   . Hyperlipemia     Past Surgical History:  Procedure Laterality Date  . HERNIA REPAIR Right   . KNEE ARTHROSCOPY  right knee  . TOTAL KNEE ARTHROPLASTY Right 02/15/2017   Procedure: TOTAL KNEE ARTHROPLASTY;  Surgeon: Carole Civil, MD;  Location: [facility];  Service: Orthopedics;  Laterality: Right;    There were no vitals filed for this visit.  Subjective Assessment - 03/21/17 0819    Subjective  Patient states some days he feels like he can bend his knee more than others and then some mornings he wakes up with his knee feelings. He staets his HEP is going well and that the low load long duration stetch is not painful and he ices after.     Limitations  Lifting;Standing;Walking;House hold activities    Currently in Pain?  Yes    Pain Score  1     Pain Location  Knee    Pain Orientation  Right    Pain Descriptors / Indicators  Aching    Pain Type  Surgical pain    Pain Onset  1 to 4 weeks ago    Pain Frequency  Intermittent       [facility] Adult PT Treatment/Exercise -  03/21/17 0001      Knee/Hip Exercises: Stretches   Passive Hamstring Stretch  Right;3 reps;30 seconds    Passive Hamstring Stretch Limitations  12" box    Knee: Self-Stretch to increase Flexion  Right;3 reps;30 seconds    Knee: Self-Stretch Limitations  12" step      Knee/Hip Exercises: Aerobic   Nustep  5 minutes on level 1; seat 12, BLE (no UE)      Knee/Hip Exercises: Standing   Forward Lunges  1 set;10 reps;Limitations;Right    Forward Lunges Limitations  10 second; movement with mobilization for AP glide for knee flexion, grade III/IV    Terminal Knee Extension  Right;Theraband    Theraband Level (Terminal Knee Extension)  Level 4 (Blue)    Terminal Knee Extension Limitations  20x 10 seconds      Knee/Hip Exercises: Supine   Quad Sets  Right;15 reps    Quad Sets Limitations  5 seconds    Heel Slides  Right;15 reps    Heel Slides Limitations  5 second hold      Manual Therapy   Manual Therapy  Joint mobilization;Soft tissue mobilization    Manual therapy comments  completed separate rest of treatment    Joint Mobilization  AP/PA glide to tbibiofemoral joint for flexion/extension, grade III for 4x 30-45 seconds    Soft tissue mobilization  Soft tissue mobilzation to right quadriceps tendon to decrease restrictions, and decrease pain       PT Education - 03/21/17 0847    Education provided  Yes    Education Details  eduacetd on form/technique throughout session    Person(s) Educated  Patient    Methods  Explanation    Comprehension  Verbalized understanding       PT Short Term Goals - 03/07/17 1138      PT SHORT TERM GOAL #1   Title   Patient to be compliant with correct performance of HEP, to be updated weekly      Time  3    Period  Weeks    Status  New    Target Date  03/28/17      PT SHORT TERM GOAL #2   Title  Patient will improve ROM for Rt knee extension/flexion by to 5-95 degrees to improve functional mobility with gait and stair ambulation    Time  3     Period  Weeks    Status  New      PT SHORT TERM GOAL #3   Title  Patient will perform 5x sit to stand in less than or equal to 12 seconds to improve functional LE strength and decrease fall risk     Time  3    Period  Weeks    Status  New      PT SHORT TERM GOAL #4   Title  --    Time  --    Period  --    Status  --        PT Long Term Goals - 03/08/17 1141      PT LONG TERM GOAL #1   Title  Patient will improve ROM for Rt knee extension/flexion by to 0-110 degrees to improve functional mobility with gait and stair ambulation    Time  6    Period  Weeks    Status  New    Target Date  04/18/17      PT LONG TERM GOAL #2   Title  Patient to show 5/5 MMT grade in all tested musculature in order to improve balance and performance during gait and stair ambulation     Time  6    Period  Weeks    Status  New      PT LONG TERM GOAL #3   Title  Patient will be participating in regular fitness program for strength and balance training to be independent with maintaining functional mobility and reducing pain; and to return to PLOF with coaching.     Time  6    Period  Weeks    Status  New      PT LONG TERM GOAL #4   Title   Patient will perform 5x sit to stand and TUG in less than or equal to 12 seconds with LRAD to improve functional LE strength and decrease fall risk     Time  6    Period  Weeks    Status  New        Plan - 03/21/17 7026    Clinical Impression Statement  Patient is progressing in therapy and he continues to advance functional strengthening with no increase in pain and reported decreased stiffness following joint  mobilization for right knee flexion/extension. He has a positive response to stretches and joint mobilization but remains limited by decreased ROM into flexion and extension of the Rt knee. Today his ROM was measured today and continues to remain at 13-75 for Rt knee. He continues to have tightness fo right quadriceps tendon and bilateral hamstring  tendons. He will continue to benefit skilled PT services to address current impairments and progress towards goals to achieve goals, reduce pain, and improve QOL.    Rehab Potential  Good    Clinical Impairments Affecting Rehab Potential  (+) motivated, (-) chronic knee pain    PT Frequency  3x / week    PT Duration  6 weeks    PT Treatment/Interventions  ADLs/Self Care Home Management;Electrical Stimulation;Cryotherapy;DME Instruction;Gait training;Stair training;Functional mobility training;Therapeutic activities;Therapeutic exercise;Balance training;Neuromuscular re-education;Patient/family education;Manual techniques;Passive range of motion;Scar mobilization;Energy conservation;Taping    PT Next Visit Plan  Focus on soft tissue mobilization for quadiceps and bilatearl hamstring tendons on right knee. Continue with NuStep for ROM; Continue joint mobilzation for flexion/extension of Rt knee; perform soft tissuemobilizaiton to hamstring and quads for soft tissue restrictions. Continue mobility and ROM exercises; continue TKE and rockerboard.     PT Home Exercise Plan  Eval: quad set, heel slide, assisted right knee flexion stretch; 2/8: SAQ, scar mobilization; 03/16/17 - TKE with green TB, low load long duration stretch    Consulted and Agree with Plan of Care  Patient       Patient will benefit from skilled therapeutic intervention in order to improve the following deficits and impairments:  Abnormal gait, Decreased activity tolerance, Decreased endurance, Decreased knowledge of use of DME, Decreased range of motion, Decreased skin integrity, Decreased strength, Hypomobility, Increased fascial restricitons, Decreased balance, Decreased mobility, Decreased scar mobility, Difficulty walking, Increased edema, Pain, Impaired flexibility, Postural dysfunction  Visit Diagnosis: Acute pain of right knee  Stiffness of right knee, not elsewhere classified  Other abnormalities of gait and mobility  Other  symptoms and signs involving the musculoskeletal system     Problem List Patient Active Problem List   Diagnosis Date Noted  . S/P total knee replacement, right 02/15/17 03/01/2017  . Primary osteoarthritis of both knees 10/13/2016  . Chest pain 06/06/2016  . Hyperlipemia 04/16/2013  . HTN (hypertension), benign 04/16/2013  . Impingement syndrome of right shoulder 03/08/2011    Kipp Brood, PT, DPT Physical Therapist with [facility]  03/21/2017 10:41 AM    [place], [place], [address] Phone: [phone]   Fax:  [phone]  Name: Christopher [name] MRN: [id number] Date of Birth: [birth year]

## 2017-03-23 ENCOUNTER — Ambulatory Visit (HOSPITAL_COMMUNITY): Payer: BLUE CROSS/BLUE SHIELD | Admitting: Physical Therapy

## 2017-03-23 ENCOUNTER — Encounter (HOSPITAL_COMMUNITY): Payer: Self-pay | Admitting: Physical Therapy

## 2017-03-23 DIAGNOSIS — M25661 Stiffness of right knee, not elsewhere classified: Secondary | ICD-10-CM

## 2017-03-23 DIAGNOSIS — M25561 Pain in right knee: Secondary | ICD-10-CM

## 2017-03-23 DIAGNOSIS — R2689 Other abnormalities of gait and mobility: Secondary | ICD-10-CM

## 2017-03-23 DIAGNOSIS — R29898 Other symptoms and signs involving the musculoskeletal system: Secondary | ICD-10-CM

## 2017-03-23 NOTE — Therapy (Signed)
[place], [place], [address] Phone: [phone]   Fax:  [phone]  Physical Therapy Treatment  Patient Details  Name: Christopher [name] MRN: [id number] Date of Birth: 06-03-1960 Referring Provider: [name]   Encounter Date: 03/23/2017  PT End of Session - 03/23/17 0821    Visit Number  8    Number of Visits  19    Date for PT Re-Evaluation  03/28/17    Authorization Type  [employer]    Authorization Time Period  03/07/17-04/18/17    PT Start Time  0818    PT Stop Time  0900    PT Time Calculation (min)  42 min    Activity Tolerance  Patient tolerated treatment well;No increased pain    Behavior During Therapy  WFL for tasks assessed/performed       Past Medical History:  Diagnosis Date  . Arthritis   . Atrial fibrillation ([place])    a. occuring in the post-operative setting in 02/2017  . HTN (hypertension)   . Hyperlipemia     Past Surgical History:  Procedure Laterality Date  . HERNIA REPAIR Right   . KNEE ARTHROSCOPY  right knee  . TOTAL KNEE ARTHROPLASTY Right 02/15/2017   Procedure: TOTAL KNEE ARTHROPLASTY;  Surgeon: Carole Civil, MD;  Location: [facility];  Service: Orthopedics;  Laterality: Right;    There were no vitals filed for this visit.  Subjective Assessment - 03/23/17 0819    Subjective  Patient reported that he is not having any pain today, but just some stiffness in his knee that he does not quantify. Patient stated he has been doing his exercises at home.     Currently in Pain?  No/denies                      [facility] Adult PT Treatment/Exercise - 03/23/17 0001      Knee/Hip Exercises: Stretches   Passive Hamstring Stretch  Right;3 reps;30 seconds    Passive Hamstring Stretch Limitations  12" box    Knee: Self-Stretch to increase Flexion  Right;3 reps;30 seconds    Knee: Self-Stretch Limitations  12" step      Knee/Hip Exercises: Aerobic   Nustep  5 minutes on level 1; seat 12, BLE (no UE) To improve knee range of motion      Knee/Hip Exercises: Standing   Forward Lunges  1 set;10 reps;Limitations;Right    Forward Lunges Limitations  10 second; movement with mobilization for AP glide for knee flexion, grade III/IV    Terminal Knee Extension  Right;Theraband    Theraband Level (Terminal Knee Extension)  Level 4 (Blue)    Terminal Knee Extension Limitations  20x 10 seconds      Knee/Hip Exercises: Supine   Quad Sets  Right;15 reps    Quad Sets Limitations  5 second holds    Heel Slides  Right;15 reps    Heel Slides Limitations  5 second hold      Manual Therapy   Manual Therapy  Joint mobilization;Soft tissue mobilization    Manual therapy comments  completed separate from the rest of skilled interventions    Joint Mobilization  AP/PA glide to tbibiofemoral joint for flexion/extension, grade III for 4x 30-45 seconds anterior and posterior    Soft tissue mobilization  Soft tissue mobilzation to right quadriceps and hamstring muscle bellies and tendons to decrease restrictions, and decrease pain  PT Education - 03/23/17 0820    Education provided  Yes    Education Details  Patient educated on purpose and technique with interventions throughout session.     Person(s) Educated  Patient    Methods  Explanation;Tactile cues;Verbal cues    Comprehension  Verbalized understanding;Returned demonstration       PT Short Term Goals - 03/07/17 1138      PT SHORT TERM GOAL #1   Title   Patient to be compliant with correct performance of HEP, to be updated weekly      Time  3    Period  Weeks    Status  New    Target Date  03/28/17      PT SHORT TERM GOAL #2   Title  Patient will improve ROM for Rt knee extension/flexion by to 5-95 degrees to improve functional mobility with gait and stair ambulation    Time  3    Period  Weeks    Status  New      PT SHORT TERM GOAL #3   Title  Patient will perform 5x  sit to stand in less than or equal to 12 seconds to improve functional LE strength and decrease fall risk     Time  3    Period  Weeks    Status  New      PT SHORT TERM GOAL #4   Title  --    Time  --    Period  --    Status  --        PT Long Term Goals - 03/08/17 1141      PT LONG TERM GOAL #1   Title  Patient will improve ROM for Rt knee extension/flexion by to 0-110 degrees to improve functional mobility with gait and stair ambulation    Time  6    Period  Weeks    Status  New    Target Date  04/18/17      PT LONG TERM GOAL #2   Title  Patient to show 5/5 MMT grade in all tested musculature in order to improve balance and performance during gait and stair ambulation     Time  6    Period  Weeks    Status  New      PT LONG TERM GOAL #3   Title  Patient will be participating in regular fitness program for strength and balance training to be independent with maintaining functional mobility and reducing pain; and to return to PLOF with coaching.     Time  6    Period  Weeks    Status  New      PT LONG TERM GOAL #4   Title   Patient will perform 5x sit to stand and TUG in less than or equal to 12 seconds with LRAD to improve functional LE strength and decrease fall risk     Time  6    Period  Weeks    Status  New            Plan - 03/23/17 0857    Clinical Impression Statement  This session continued to focus on improving patient's range of motion in the right knee. With manual therapy, therapist continued to feel that patient's right quadriceps muscle as well as medial and lateral hamstrings had tightness and restrictions. Patient continued to demonstrate deficits in right knee flexion and extension range of motion. This session therapist added a seated hamstring stretch  to patient's home exercise program. Patient would continue to benefit from skilled physical therapy in order to address deficits in range of motion, strength, and overall functional mobility.      Rehab Potential  Good    Clinical Impairments Affecting Rehab Potential  (+) motivated, (-) chronic knee pain    PT Frequency  3x / week    PT Duration  6 weeks    PT Treatment/Interventions  ADLs/Self Care Home Management;Electrical Stimulation;Cryotherapy;DME Instruction;Gait training;Stair training;Functional mobility training;Therapeutic activities;Therapeutic exercise;Balance training;Neuromuscular re-education;Patient/family education;Manual techniques;Passive range of motion;Scar mobilization;Energy conservation;Taping    PT Next Visit Plan  Focus on soft tissue mobilization for quadiceps and bilatearl hamstring tendons on right knee. Continue with NuStep for ROM; Continue joint mobilzation for flexion/extension of Rt knee; perform soft tissuemobilizaiton to hamstring and quads for soft tissue restrictions. Continue mobility and ROM exercises; continue TKE and rockerboard.     PT Home Exercise Plan  Eval: quad set, heel slide, assisted right knee flexion stretch; 2/8: SAQ, scar mobilization; 03/16/17 - TKE with green TB, low load long duration stretch; 03/23/17: Seated hamstring stretch 3 x 30 seconds 1x/day    Consulted and Agree with Plan of Care  Patient       Patient will benefit from skilled therapeutic intervention in order to improve the following deficits and impairments:  Abnormal gait, Decreased activity tolerance, Decreased endurance, Decreased knowledge of use of DME, Decreased range of motion, Decreased skin integrity, Decreased strength, Hypomobility, Increased fascial restricitons, Decreased balance, Decreased mobility, Decreased scar mobility, Difficulty walking, Increased edema, Pain, Impaired flexibility, Postural dysfunction  Visit Diagnosis: Acute pain of right knee  Stiffness of right knee, not elsewhere classified  Other abnormalities of gait and mobility  Other symptoms and signs involving the musculoskeletal system     Problem List Patient Active Problem List    Diagnosis Date Noted  . S/P total knee replacement, right 02/15/17 03/01/2017  . Primary osteoarthritis of both knees 10/13/2016  . Chest pain 06/06/2016  . Hyperlipemia 04/16/2013  . HTN (hypertension), benign 04/16/2013  . Impingement syndrome of right shoulder 03/08/2011    Clarene Critchley PT, DPT 11:01 AM, 03/23/17 [place], [place], [address] Phone: [phone]   Fax:  [phone]  Name: Christopher [name] MRN: [id number] Date of Birth: 20-Apr-1960

## 2017-03-23 NOTE — Patient Instructions (Signed)
  SEATED HAMSTRING STRETCH While seated, rest your heel on the floor with your knee straight and gently lean forward until a stretch is felt behind your knee/thigh.  Repeat 3 Times Hold 30 Seconds Complete 1 Set Perform 1 Time(s) a Day

## 2017-03-25 ENCOUNTER — Ambulatory Visit (HOSPITAL_COMMUNITY): Payer: BLUE CROSS/BLUE SHIELD | Admitting: Physical Therapy

## 2017-03-25 DIAGNOSIS — M25661 Stiffness of right knee, not elsewhere classified: Secondary | ICD-10-CM

## 2017-03-25 DIAGNOSIS — R2689 Other abnormalities of gait and mobility: Secondary | ICD-10-CM

## 2017-03-25 DIAGNOSIS — R29898 Other symptoms and signs involving the musculoskeletal system: Secondary | ICD-10-CM

## 2017-03-25 DIAGNOSIS — M25561 Pain in right knee: Secondary | ICD-10-CM

## 2017-03-25 NOTE — Therapy (Addendum)
[place], [place], [address] Phone: [phone]   Fax:  [phone]  Physical Therapy Treatment  Patient Details  Name: Christopher [name] MRN: [id number] Date of Birth: Oct 12, 1960 Referring Provider: [name]   # feet walked: community ambulator with [facility] ROM:  Flexion:  62            Extension: 12    Encounter Date: 03/25/2017  PT End of Session - 03/25/17 0902    Visit Number  9    Number of Visits  19    Date for PT Re-Evaluation  03/28/17    Authorization Type  [employer]    Authorization Time Period  03/07/17-04/18/17    PT Start Time  0817    PT Stop Time  0904    PT Time Calculation (min)  47 min    Activity Tolerance  Patient tolerated treatment well;No increased pain    Behavior During Therapy  WFL for tasks assessed/performed       Past Medical History:  Diagnosis Date  . Arthritis   . Atrial fibrillation ([place])    a. occuring in the post-operative setting in 02/2017  . HTN (hypertension)   . Hyperlipemia     Past Surgical History:  Procedure Laterality Date  . HERNIA REPAIR Right   . KNEE ARTHROSCOPY  right knee  . TOTAL KNEE ARTHROPLASTY Right 02/15/2017   Procedure: TOTAL KNEE ARTHROPLASTY;  Surgeon: Carole Civil, MD;  Location: [facility];  Service: Orthopedics;  Laterality: Right;    There were no vitals filed for this visit.  Subjective Assessment - 03/25/17 0819    Subjective  Pt reports stiffness in Rt knee but no pain.    Currently in Pain?  No/denies                      [facility] Adult PT Treatment/Exercise - 03/25/17 0001      Knee/Hip Exercises: Stretches   Passive Hamstring Stretch  Right;3 reps;30 seconds    Passive Hamstring Stretch Limitations  12" box    Knee: Self-Stretch to increase Flexion  Right;3 reps;30 seconds    Knee: Self-Stretch Limitations  12" step    Gastroc Stretch  Both;3 reps;30 seconds    Gastroc Stretch Limitations   slant board      Knee/Hip Exercises: Aerobic   Stationary Bike  5 minutes to increase ROM at EOS after manual rocking seat 17      Knee/Hip Exercises: Standing   Forward Lunges  1 set;10 reps;Limitations;Right    Forward Lunges Limitations  4" no UE's    Terminal Knee Extension  Right;Theraband    Theraband Level (Terminal Knee Extension)  Level 4 (Blue)    Terminal Knee Extension Limitations  20x 10 seconds      Knee/Hip Exercises: Supine   Quad Sets  Right;15 reps    [employer] Limitations  5 second holds    Short Arc [employer]  Right;15 reps    Short Arc Quad Sets Limitations  only able to move 12 degrees (to 24 degrees)    Heel Slides  Right;15 reps    Heel Slides Limitations  5 second hold    Knee Extension  AROM    Knee Extension Limitations  12 was 14    Knee Flexion  AROM    Knee Flexion Limitations  62 was 74      Manual Therapy   Manual Therapy  Myofascial release    Manual therapy comments  completed separate from the rest of skilled interventions    Myofascial Release  to anterior knee and posterior with knee hang to decrease adhesions and improve mobilty               PT Short Term Goals - 03/07/17 1138      PT SHORT TERM GOAL #1   Title   Patient to be compliant with correct performance of HEP, to be updated weekly      Time  3    Period  Weeks    Status  New    Target Date  03/28/17      PT SHORT TERM GOAL #2   Title  Patient will improve ROM for Rt knee extension/flexion by to 5-95 degrees to improve functional mobility with gait and stair ambulation    Time  3    Period  Weeks    Status  New      PT SHORT TERM GOAL #3   Title  Patient will perform 5x sit to stand in less than or equal to 12 seconds to improve functional LE strength and decrease fall risk     Time  3    Period  Weeks    Status  New      PT SHORT TERM GOAL #4   Title  --    Time  --    Period  --    Status  --        PT Long Term Goals - 03/08/17 1141      PT LONG  TERM GOAL #1   Title  Patient will improve ROM for Rt knee extension/flexion by to 0-110 degrees to improve functional mobility with gait and stair ambulation    Time  6    Period  Weeks    Status  New    Target Date  04/18/17      PT LONG TERM GOAL #2   Title  Patient to show 5/5 MMT grade in all tested musculature in order to improve balance and performance during gait and stair ambulation     Time  6    Period  Weeks    Status  New      PT LONG TERM GOAL #3   Title  Patient will be participating in regular fitness program for strength and balance training to be independent with maintaining functional mobility and reducing pain; and to return to PLOF with coaching.     Time  6    Period  Weeks    Status  New      PT LONG TERM GOAL #4   Title   Patient will perform 5x sit to stand and TUG in less than or equal to 12 seconds with LRAD to improve functional LE strength and decrease fall risk     Time  6    Period  Weeks    Status  New            Plan - 03/25/17 9833    Clinical Impression Statement  focused session on ROM today.  Pt conitnues to be limited in both extension and flexion.  Manual reveals adhesions and general tightness in both anterior and posterior aspect of knee and ITB/hamstring musculature.  Spent increased time on myofascial techniques to decrease the adhesions and still unable to achieve greater than 12-62 degrees ROM.  flexion is decreased 10 degrees from last recorded measurement.  Pt  may benefit from JAS brace to assist with increasing ROM.      Rehab Potential  Good    Clinical Impairments Affecting Rehab Potential  (+) motivated, (-) chronic knee pain    PT Frequency  3x / week    PT Duration  6 weeks    PT Treatment/Interventions  ADLs/Self Care Home Management;Electrical Stimulation;Cryotherapy;DME Instruction;Gait training;Stair training;Functional mobility training;Therapeutic activities;Therapeutic exercise;Balance training;Neuromuscular  re-education;Patient/family education;Manual techniques;Passive range of motion;Scar mobilization;Energy conservation;Taping    PT Next Visit Plan  Focus on manual techniques to improve mobitily with focus on mobiltiy.  PT returns to MD next week.    PT Home Exercise Plan  Eval: quad set, heel slide, assisted right knee flexion stretch; 2/8: SAQ, scar mobilization; 03/16/17 - TKE with green TB, low load long duration stretch; 03/23/17: Seated hamstring stretch 3 x 30 seconds 1x/day    Consulted and Agree with Plan of Care  Patient       Patient will benefit from skilled therapeutic intervention in order to improve the following deficits and impairments:  Abnormal gait, Decreased activity tolerance, Decreased endurance, Decreased knowledge of use of DME, Decreased range of motion, Decreased skin integrity, Decreased strength, Hypomobility, Increased fascial restricitons, Decreased balance, Decreased mobility, Decreased scar mobility, Difficulty walking, Increased edema, Pain, Impaired flexibility, Postural dysfunction  Visit Diagnosis: Acute pain of right knee  Stiffness of right knee, not elsewhere classified  Other abnormalities of gait and mobility  Other symptoms and signs involving the musculoskeletal system     Problem List Patient Active Problem List   Diagnosis Date Noted  . S/P total knee replacement, right 02/15/17 03/01/2017  . Primary osteoarthritis of both knees 10/13/2016  . Chest pain 06/06/2016  . Hyperlipemia 04/16/2013  . HTN (hypertension), benign 04/16/2013  . Impingement syndrome of right shoulder 03/08/2011   Teena Irani, PTA/CLT [phone]   Teena Irani 03/25/2017, 9:06 AM  [place] [address] [place], [place], [address] Phone: [phone]   Fax:  [phone]  Name: ABDULRAHMAN BRACEY MRN: [id number] Date of Birth: 1960/04/06

## 2017-03-28 ENCOUNTER — Other Ambulatory Visit: Payer: Self-pay

## 2017-03-28 ENCOUNTER — Ambulatory Visit (HOSPITAL_COMMUNITY): Payer: BLUE CROSS/BLUE SHIELD

## 2017-03-28 ENCOUNTER — Encounter (HOSPITAL_COMMUNITY): Payer: Self-pay

## 2017-03-28 DIAGNOSIS — R29898 Other symptoms and signs involving the musculoskeletal system: Secondary | ICD-10-CM

## 2017-03-28 DIAGNOSIS — M25561 Pain in right knee: Secondary | ICD-10-CM | POA: Diagnosis not present

## 2017-03-28 DIAGNOSIS — M25661 Stiffness of right knee, not elsewhere classified: Secondary | ICD-10-CM

## 2017-03-28 DIAGNOSIS — R2689 Other abnormalities of gait and mobility: Secondary | ICD-10-CM

## 2017-03-28 NOTE — Therapy (Signed)
[place], [place], [address] Phone: [phone]   Fax:  [phone]  Physical Therapy Treatment/Re-Assessment  Patient Details  Name: Christopher [name] MRN: [id number] Date of Birth: Jun 17, 1960 Referring Provider: [name], MD   Encounter Date: 03/28/2017    ROM:  Flexion: 80 degrees            Extension: 8 degrees   PT End of Session - 03/28/17 0952    Visit Number  10    Number of Visits  19    Date for PT Re-Evaluation  04/18/17    Authorization Type  [employer]    Authorization Time Period  03/07/17-04/18/17    PT Start Time  0951    PT Stop Time  1030    PT Time Calculation (min)  39 min    Activity Tolerance  Patient tolerated treatment well;No increased pain    Behavior During Therapy  WFL for tasks assessed/performed       Past Medical History:  Diagnosis Date  . Arthritis   . Atrial fibrillation ([place])    a. occuring in the post-operative setting in 02/2017  . HTN (hypertension)   . Hyperlipemia     Past Surgical History:  Procedure Laterality Date  . HERNIA REPAIR Right   . KNEE ARTHROSCOPY  right knee  . TOTAL KNEE ARTHROPLASTY Right 02/15/2017   Procedure: TOTAL KNEE ARTHROPLASTY;  Surgeon: Carole Civil, MD;  Location: [facility];  Service: Orthopedics;  Laterality: Right;    There were no vitals filed for this visit.  Subjective Assessment - 03/28/17 0953    Subjective  Patient reports he has been walking without his cane around his home and states he feels like his walking has improved since starting therapy. He reports yesterday he [place] around the mall for 45-60 minutes yesterday with no increase in pain and states he has been doing his HEP at least 2 times each day and continues to ice.    Limitations  Lifting;Standing;Walking;House hold activities    How long can you sit comfortably?  unlimited    How long can you stand comfortably?  standing for 45-60 minutes     How long can you walk comfortably?  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  No/denies        [facility] PT Assessment - 03/28/17 0001      Assessment   Medical Diagnosis  Right TKA    Referring Provider  [name], MD    Onset Date/Surgical Date  02/15/17    Hand Dominance  Right    Next MD Visit  ~ 1 week    Prior Therapy  ~15 years ago for Right knee scope and HH for this sx ended this past saturday      Observation/Other Assessments   Focus on Therapeutic Outcomes (FOTO)   47% lmiited was 49% limited on 03/07/17      AROM   Overall AROM Comments  AROM for knee flexion imprvoed greatly after maual tehrapy and PNF stretching for quad tendon (from 71*  to 80*)    Right Knee Extension  8 was 13    Right Knee Flexion  71 was 72      Palpation   Patella mobility  hypomobile      Standardized Balance  Assessment   Five times sit to stand comments   9 seconds; no use of Bil UE however patient continues to use Left leg predominately      Timed Up and Go Test   TUG  Normal TUG    Normal TUG (seconds)  13 SPC       [facility] Adult PT Treatment/Exercise - 03/28/17 0001      Knee/Hip Exercises: Stretches   Passive Hamstring Stretch  Right;3 reps;30 seconds    Passive Hamstring Stretch Limitations  12" box    Knee: Self-Stretch to increase Flexion  Right;3 reps;30 seconds    Knee: Self-Stretch Limitations  12" step      Manual Therapy   Manual Therapy  Myofascial release;Muscle Energy Technique    Manual therapy comments  completed separate from the rest of skilled interventions    Soft tissue mobilization  Soft tissue mobilzation to right quadriceps and hamstring muscle bellies and tendons to decrease restrictions,and increase ROM    Muscle Energy Technique  PNF stretch for right quad tendon, contract relax to facilitate muscle lengthening of right quadriceps for greater  knee flexion (patient had positive response with 9 degree improvement following manual therapy and PNF stretching)        PT Education - 03/28/17 1243    Education provided  Yes    Education Details  Educated on progress towards goals andm ajor limitation of ROM currently. Educated on improtance of performign stretches at home and on potential for delayed soreness following aggressive manual treatmenta nd stretching for ROM limitations. Instructed patient to ice at home and contineu performing stretches.    Person(s) Educated  Patient    Methods  Explanation    Comprehension  Verbalized understanding       PT Short Term Goals - 03/28/17 0954      PT SHORT TERM GOAL #1   Title   Patient to be compliant with correct performance of HEP, to be updated weekly      Time  3    Period  Weeks    Status  Achieved      PT SHORT TERM GOAL #2   Title  Patient will improve ROM for Rt knee extension/flexion by to 5-95 degrees to improve functional mobility with gait and stair ambulation    Baseline  03/28/17 - 8-80    Time  3    Period  Weeks    Status  On-going      PT SHORT TERM GOAL #3   Title  Patient will perform 5x sit to stand in less than or equal to 12 seconds to improve functional LE strength and decrease fall risk     Baseline  03/28/17 - 9 seconds however heavy reliance on left LE    Time  3    Period  Weeks    Status  Partially Met        PT Long Term Goals - 03/28/17 9735      PT LONG TERM GOAL #1   Title  Patient will improve ROM for Rt knee extension/flexion by to 0-110 degrees to improve functional mobility with gait and stair ambulation    Baseline  03/28/17 - 8-80    Time  6    Period  Weeks    Status  New      PT LONG TERM GOAL #2   Title  Patient to show 5/5 MMT grade in all tested musculature in order to improve balance and performance during gait  and stair ambulation     Time  6    Period  Weeks    Status  On-going      PT LONG TERM GOAL #3   Title  Patient  will be participating in regular fitness program for strength and balance training to be independent with maintaining functional mobility and reducing pain; and to return to PLOF with coaching.     Time  6    Period  Weeks    Status  On-going      PT LONG TERM GOAL #4   Title   Patient will perform 5x sit to stand and TUG in less than or equal to 12 seconds with LRAD to improve functional LE strength and decrease fall risk     Baseline  03/28/17 - 13 seconds With [facility]    Time  6    Period  Weeks    Status  On-going         Plan - 03/28/17 1245    Clinical Impression Statement  Re-Assessment performed today and patient has met/partially met 2/3 short term goals and is progressing towards his long-term goals. He remains greatly limited by myofascial restriction along his right quadriceps and bilateral hamstring tendons restricting his ROM. At the start of the session, AROM was 8-71 degrees. Following manual therapy and PNF stretching for the quad tendon the patient achieved 80 degrees flexion. Further session will focus on manual treatment and exercise to improve muscle elasticity to gain greater ROM. He will continue to benefit skilled PT services to address current impairments and progress towards goals to achieve goals, reduce pain, and improve QOL.    Rehab Potential  Good    Clinical Impairments Affecting Rehab Potential  (+) motivated, (-) chronic knee pain    PT Frequency  3x / week    PT Duration  6 weeks    PT Treatment/Interventions  ADLs/Self Care Home Management;Electrical Stimulation;Cryotherapy;DME Instruction;Gait training;Stair training;Functional mobility training;Therapeutic activities;Therapeutic exercise;Balance training;Neuromuscular re-education;Patient/family education;Manual techniques;Passive range of motion;Scar mobilization;Energy conservation;Taping    PT Next Visit Plan  Focus on manual techniques to improve mobitily with focus on mobiltiy. Continue with PNF, contract  relax stretch for right quad tendon and manual therapy in lengthened position for quad and hamstring tendons due to myofacial restrictions. Continue with bike for ROM.    PT Home Exercise Plan  Eval: quad set, heel slide, assisted right knee flexion stretch; 2/8: SAQ, scar mobilization; 03/16/17 - TKE with green TB, low load long duration stretch; 03/23/17: Seated hamstring stretch 3 x 30 seconds 1x/day    Consulted and Agree with Plan of Care  Patient       Patient will benefit from skilled therapeutic intervention in order to improve the following deficits and impairments:  Abnormal gait, Decreased activity tolerance, Decreased endurance, Decreased knowledge of use of DME, Decreased range of motion, Decreased skin integrity, Decreased strength, Hypomobility, Increased fascial restricitons, Decreased balance, Decreased mobility, Decreased scar mobility, Difficulty walking, Increased edema, Pain, Impaired flexibility, Postural dysfunction  Visit Diagnosis: Acute pain of right knee  Stiffness of right knee, not elsewhere classified  Other abnormalities of gait and mobility  Other symptoms and signs involving the musculoskeletal system     Problem List Patient Active Problem List   Diagnosis Date Noted  . S/P total knee replacement, right 02/15/17 03/01/2017  . Primary osteoarthritis of both knees 10/13/2016  . Chest pain 06/06/2016  . Hyperlipemia 04/16/2013  . HTN (hypertension), benign 04/16/2013  . Impingement syndrome  of right shoulder 03/08/2011    Kipp Brood, PT, DPT Physical Therapist with [facility]  03/28/2017 12:57 PM    [place] [address] [place], [place], [address] Phone: [phone]   Fax:  [phone]  Name: Christopher [name] MRN: [id number] Date of Birth: Jun 30, 1960

## 2017-03-30 ENCOUNTER — Ambulatory Visit (INDEPENDENT_AMBULATORY_CARE_PROVIDER_SITE_OTHER): Payer: BLUE CROSS/BLUE SHIELD | Admitting: Orthopedic Surgery

## 2017-03-30 ENCOUNTER — Encounter (HOSPITAL_COMMUNITY): Payer: Self-pay

## 2017-03-30 ENCOUNTER — Other Ambulatory Visit: Payer: Self-pay

## 2017-03-30 ENCOUNTER — Ambulatory Visit (HOSPITAL_COMMUNITY): Payer: BLUE CROSS/BLUE SHIELD

## 2017-03-30 VITALS — BP 139/85 | HR 83 | Ht 71.0 in | Wt 182.0 lb

## 2017-03-30 DIAGNOSIS — M25561 Pain in right knee: Secondary | ICD-10-CM

## 2017-03-30 DIAGNOSIS — M25661 Stiffness of right knee, not elsewhere classified: Secondary | ICD-10-CM

## 2017-03-30 DIAGNOSIS — Z96651 Presence of right artificial knee joint: Secondary | ICD-10-CM

## 2017-03-30 DIAGNOSIS — T8482XA Fibrosis due to internal orthopedic prosthetic devices, implants and grafts, initial encounter: Secondary | ICD-10-CM

## 2017-03-30 DIAGNOSIS — R29898 Other symptoms and signs involving the musculoskeletal system: Secondary | ICD-10-CM

## 2017-03-30 DIAGNOSIS — R2689 Other abnormalities of gait and mobility: Secondary | ICD-10-CM

## 2017-03-30 MED ORDER — METHOCARBAMOL 500 MG PO TABS: 500.0000 mg | ORAL_TABLET | Freq: Four times a day (QID) | ORAL | 3 refills | Status: DC | PRN

## 2017-03-30 MED ORDER — HYDROCODONE-ACETAMINOPHEN 5-325 MG PO TABS: 1.0000 | ORAL_TABLET | Freq: Four times a day (QID) | ORAL | 0 refills | Status: DC | PRN

## 2017-03-30 NOTE — Therapy (Signed)
[place], [place], [address] Phone: [phone]   Fax:  [phone]  Physical Therapy Treatment  Patient Details  Name: Christopher [name] MRN: [id number] Date of Birth: 07-13-1960 Referring Provider: [name], MD   Encounter Date: 03/30/2017  PT End of Session - 03/30/17 1030    Visit Number  11    Number of Visits  19    Date for PT Re-Evaluation  04/18/17    Authorization Type  [employer]    Authorization Time Period  03/07/17-04/18/17    PT Start Time  0950    PT Stop Time  1032    PT Time Calculation (min)  42 min    Activity Tolerance  Patient tolerated treatment well;No increased pain    Behavior During Therapy  WFL for tasks assessed/performed       Past Medical History:  Diagnosis Date  . Arthritis   . Atrial fibrillation ([place])    a. occuring in the post-operative setting in 02/2017  . HTN (hypertension)   . Hyperlipemia     Past Surgical History:  Procedure Laterality Date  . HERNIA REPAIR Right   . KNEE ARTHROSCOPY  right knee  . TOTAL KNEE ARTHROPLASTY Right 02/15/2017   Procedure: TOTAL KNEE ARTHROPLASTY;  Surgeon: Carole Civil, MD;  Location: [facility];  Service: Orthopedics;  Laterality: Right;    There were no vitals filed for this visit.  Subjective Assessment - 03/30/17 0954    Subjective  He reports he had a lot more pain after last session and states he had to ice his leg twice. He states he was able to do some exercises last night and that he thinks that helped and denies pain this morning. He reports he will be seeing Dr. Aline Brochure for a follow up today at 11:30 and that he was told last time if his ROM doesn't improve Dr. Aline Brochure may want to put him under anesthesia for a manipulation.     Limitations  Lifting;Standing;Walking;House hold activities    How long can you sit comfortably?  unlimited    How long can you stand comfortably?  standing for 45-60 minutes     How long can you walk comfortably?  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  No/denies       [facility] Adult PT Treatment/Exercise - 03/30/17 0001      Knee/Hip Exercises: Stretches   Passive Hamstring Stretch  Right;3 reps;30 seconds    Passive Hamstring Stretch Limitations  12" box    Knee: Self-Stretch to increase Flexion  Right;3 reps;30 seconds    Knee: Self-Stretch Limitations  12" step      Knee/Hip Exercises: Aerobic   Stationary Bike  5 minutes to increase ROM half revolutions      Knee/Hip Exercises: Supine   Quad Sets  Right;1 set;10 reps    [employer] Limitations  5 second holds    Short Arc [employer]  AROM;15 reps;Right    Short Arc [employer] Limitations  5 second holds; basketball    Knee Extension  AROM    Knee Extension Limitations  10    Knee Flexion Limitations  72      Manual Therapy   Manual Therapy  Myofascial release;Muscle Energy Technique    Manual therapy comments  completed separate from the rest of skilled interventions    Soft tissue mobilization  Soft tissue mobilzation to right quadriceps and hamstring muscle bellies and tendons to decrease restrictions,and increase ROM    Muscle Energy Technique  PNF stretch for right quad tendon, contract relax to facilitate muscle lengthening of right quadriceps for greater knee flexion (patient had positive response with 9 degree improvement following manual therapy and PNF stretching)        PT Education - 03/30/17 0956    Education provided  Yes    Education Details  Educated patient on ongoing limited ROM and discussed benefit of manipulation under anesthesia to regain more ROM.    Person(s) Educated  Patient    Methods  Explanation    Comprehension  Verbalized understanding       PT Short Term Goals - 03/28/17 0954      PT SHORT TERM GOAL #1   Title   Patient to be compliant with correct  performance of HEP, to be updated weekly      Time  3    Period  Weeks    Status  Achieved      PT SHORT TERM GOAL #2   Title  Patient will improve ROM for Rt knee extension/flexion by to 5-95 degrees to improve functional mobility with gait and stair ambulation    Baseline  03/28/17 - 8-80    Time  3    Period  Weeks    Status  On-going      PT SHORT TERM GOAL #3   Title  Patient will perform 5x sit to stand in less than or equal to 12 seconds to improve functional LE strength and decrease fall risk     Baseline  03/28/17 - 9 seconds however heavy reliance on left LE    Time  3    Period  Weeks    Status  Partially Met        PT Long Term Goals - 03/28/17 4193      PT LONG TERM GOAL #1   Title  Patient will improve ROM for Rt knee extension/flexion by to 0-110 degrees to improve functional mobility with gait and stair ambulation    Baseline  03/28/17 - 8-80    Time  6    Period  Weeks    Status  New      PT LONG TERM GOAL #2   Title  Patient to show 5/5 MMT grade in all tested musculature in order to improve balance and performance during gait and stair ambulation     Time  6    Period  Weeks    Status  On-going      PT LONG TERM GOAL #3   Title  Patient will be participating in regular fitness program for strength and balance training to be independent with maintaining functional mobility and reducing pain; and to return to PLOF with coaching.     Time  6    Period  Weeks    Status  On-going      PT LONG TERM GOAL #4   Title   Patient will perform 5x sit to stand and TUG in less than or equal to 12 seconds with LRAD to improve functional LE strength and decrease fall risk     Baseline  03/28/17 - 13 seconds With [facility]    Time  6    Period  Weeks    Status  On-going  Plan - 03/30/17 1031    Clinical Impression Statement  Patient continues to be extremely limited with ROM and session focused on manual treatment and exercise to improve muscle elasticity to  gain greater ROM. At end of session today his ROM remained 10-72 degrees and he was educated on the potential to have a manipulation to regain his ROM and continue with therapy. He has a follow-up with his surgeon today and will let us know what they decide. He will continue to benefit skilled PT services to address current impairments and progress towards goals to achieve goals, reduce pain, and improve mobility and QOL.    Rehab Potential  Good    Clinical Impairments Affecting Rehab Potential  (+) motivated, (-) chronic knee pain    PT Frequency  3x / week    PT Duration  6 weeks    PT Treatment/Interventions  ADLs/Self Care Home Management;Electrical Stimulation;Cryotherapy;DME Instruction;Gait training;Stair training;Functional mobility training;Therapeutic activities;Therapeutic exercise;Balance training;Neuromuscular re-education;Patient/family education;Manual techniques;Passive range of motion;Scar mobilization;Energy conservation;Taping    PT Next Visit Plan  Ask patient about visit with MD and if they are planning to do a manipulation. Continue to focus on manual techniques to improve mobitily with focus on mobiltiy. Continue with PNF, contract relax stretch for right quad tendon and manual therapy in lengthened position for quad and hamstring tendons due to myofacial restrictions. Continue with bike for ROM.    PT Home Exercise Plan  Eval: quad set, heel slide, assisted right knee flexion stretch; 2/8: SAQ, scar mobilization; 03/16/17 - TKE with green TB, low load long duration stretch; 03/23/17: Seated hamstring stretch 3 x 30 seconds 1x/day    Consulted and Agree with Plan of Care  Patient       Patient will benefit from skilled therapeutic intervention in order to improve the following deficits and impairments:  Abnormal gait, Decreased activity tolerance, Decreased endurance, Decreased knowledge of use of DME, Decreased range of motion, Decreased skin integrity, Decreased strength,  Hypomobility, Increased fascial restricitons, Decreased balance, Decreased mobility, Decreased scar mobility, Difficulty walking, Increased edema, Pain, Impaired flexibility, Postural dysfunction  Visit Diagnosis: Acute pain of right knee  Stiffness of right knee, not elsewhere classified  Other abnormalities of gait and mobility  Other symptoms and signs involving the musculoskeletal system     Problem List Patient Active Problem List   Diagnosis Date Noted  . S/P total knee replacement, right 02/15/17 03/01/2017  . Primary osteoarthritis of both knees 10/13/2016  . Chest pain 06/06/2016  . Hyperlipemia 04/16/2013  . HTN (hypertension), benign 04/16/2013  . Impingement syndrome of right shoulder 03/08/2011    Kipp Brood, PT, DPT Physical Therapist with [facility]  03/30/2017 11:46 AM    [place], [place], [address] Phone: [phone]   Fax:  [phone]  Name: KYRIAN STAGE MRN: [id number] Date of Birth: 1960-02-26

## 2017-03-30 NOTE — Progress Notes (Signed)
Routine postop follow-up status post right total knee on January 15 this is postop week #6  He has not progressed well in therapy has a 20 degree flexion contracture his knee flexion arc is only 20-  20-60  No signs of infection though we will do some laboratory studies to rule that out as well.  We will do manipulation under anesthesia with possible arthrotomy polyethylene exchange plus or minus TIBIAL COMPONENT REVISION   HE UNDERSTANDS THE FRACTURE RISK   Encounter Diagnoses  Name Primary?  . S/P total knee replacement, right 02/15/17 Yes  . Arthrofibrosis of total knee replacement, initial encounter [facility])    MARCH 5

## 2017-03-31 ENCOUNTER — Telehealth (HOSPITAL_COMMUNITY): Payer: Self-pay

## 2017-03-31 NOTE — Telephone Encounter (Signed)
Patient cancel his appt because he is having a manipulation done on 04-05-17 and will return on 04-06-17

## 2017-04-01 ENCOUNTER — Ambulatory Visit (HOSPITAL_COMMUNITY): Payer: BLUE CROSS/BLUE SHIELD

## 2017-04-01 NOTE — Patient Instructions (Addendum)
Christopher [name]  04/01/2017     @PREFPERIOPPHARMACY @   Your procedure is scheduled on  04/05/2017   Report to [facility] at  615   A.M.  Call this number if you have problems the morning of surgery:  [phone]   Remember:  Do not eat food or drink liquids after midnight.  Take these medicines the morning of surgery with A SIP OF WATER   Norco, robaxin, metoprolol.   Do not wear jewelry, make-up or nail polish.  Do not wear lotions, powders, or perfumes, or deodorant.  Do not shave 48 hours prior to surgery.  Men may shave face and neck.  Do not bring valuables to the hospital.  [facility] is not responsible for any belongings or valuables.  Contacts, dentures or bridgework may not be worn into surgery.  Leave your suitcase in the car.  After surgery it may be brought to your room.  For patients admitted to the hospital, discharge time will be determined by your treatment team.  Patients discharged the day of surgery will not be allowed to drive home.   Name and phone number of your driver:   family Special instructions:  None  Please read over the following fact sheets that you were given. Anesthesia Post-op Instructions and Care and Recovery After Surgery       General Anesthesia, Adult General anesthesia is the use of medicines to make a person "go to sleep" (be unconscious) for a medical procedure. General anesthesia is often recommended when a procedure:  Is long.  Requires you to be still or in an unusual position.  Is major and can cause you to lose blood.  Is impossible to do without general anesthesia.  The medicines used for general anesthesia are called general anesthetics. In addition to making you sleep, the medicines:  Prevent pain.  Control your blood pressure.  Relax your muscles.  Tell a health care provider about:  Any allergies you have.  All medicines you are taking, including vitamins, herbs, eye drops, creams, and  over-the-counter medicines.  Any problems you or family members have had with anesthetic medicines.  Types of anesthetics you have had in the past.  Any bleeding disorders you have.  Any surgeries you have had.  Any medical conditions you have.  Any history of heart or lung conditions, such as heart failure, sleep apnea, or chronic obstructive pulmonary disease (COPD).  Whether you are pregnant or may be pregnant.  Whether you use tobacco, alcohol, marijuana, or street drugs.  Any history of Armed forces logistics/support/administrative officer.  Any history of depression or anxiety. What are the risks? Generally, this is a safe procedure. However, problems may occur, including:  Allergic reaction to anesthetics.  Lung and heart problems.  Inhaling food or liquids from your stomach into your lungs (aspiration).  Injury to nerves.  Waking up during your procedure and being unable to move (rare).  Extreme agitation or a state of mental confusion (delirium) when you wake up from the anesthetic.  Air in the bloodstream, which can lead to stroke.  These problems are more likely to develop if you are having a major surgery or if you have an advanced medical condition. You can prevent some of these complications by answering all of your health care provider's questions thoroughly and by following all pre-procedure instructions. General anesthesia can cause side effects, including:  Nausea or vomiting  A sore throat from the breathing tube.  Feeling cold  or shivery.  Feeling tired, washed out, or achy.  Sleepiness or drowsiness.  Confusion or agitation.  What happens before the procedure? Staying hydrated Follow instructions from your health care provider about hydration, which may include:  Up to 2 hours before the procedure - you may continue to drink clear liquids, such as water, clear fruit juice, black coffee, and plain tea.  Eating and drinking restrictions Follow instructions from your health  care provider about eating and drinking, which may include:  8 hours before the procedure - stop eating heavy meals or foods such as meat, fried foods, or fatty foods.  6 hours before the procedure - stop eating light meals or foods, such as toast or cereal.  6 hours before the procedure - stop drinking milk or drinks that contain milk.  2 hours before the procedure - stop drinking clear liquids.  Medicines  Ask your health care provider about: ? Changing or stopping your regular medicines. This is especially important if you are taking diabetes medicines or blood thinners. ? Taking medicines such as aspirin and ibuprofen. These medicines can thin your blood. Do not take these medicines before your procedure if your health care provider instructs you not to. ? Taking new dietary supplements or medicines. Do not take these during the week before your procedure unless your health care provider approves them.  If you are told to take a medicine or to continue taking a medicine on the day of the procedure, take the medicine with sips of water. General instructions   Ask if you will be going home the same day, the following day, or after a longer hospital stay. ? Plan to have someone take you home. ? Plan to have someone stay with you for the first 24 hours after you leave the hospital or clinic.  For 3-6 weeks before the procedure, try not to use any tobacco products, such as cigarettes, chewing tobacco, and e-cigarettes.  You may brush your teeth on the morning of the procedure, but make sure to spit out the toothpaste. What happens during the procedure?  You will be given anesthetics through a mask and through an IV tube in one of your veins.  You may receive medicine to help you relax (sedative).  As soon as you are asleep, a breathing tube may be used to help you breathe.  An anesthesia specialist will stay with you throughout the procedure. He or she will help keep you  comfortable and safe by continuing to give you medicines and adjusting the amount of medicine that you get. He or she will also watch your blood pressure, pulse, and oxygen levels to make sure that the anesthetics do not cause any problems.  If a breathing tube was used to help you breathe, it will be removed before you wake up. The procedure may vary among health care providers and hospitals. What happens after the procedure?  You will wake up, often slowly, after the procedure is complete, usually in a recovery area.  Your blood pressure, heart rate, breathing rate, and blood oxygen level will be monitored until the medicines you were given have worn off.  You may be given medicine to help you calm down if you feel anxious or agitated.  If you will be going home the same day, your health care provider may check to make sure you can stand, drink, and urinate.  Your health care providers will treat your pain and side effects before you go home.  Do  not drive for 24 hours if you received a sedative.  You may: ? Feel nauseous and vomit. ? Have a sore throat. ? Have mental slowness. ? Feel cold or shivery. ? Feel sleepy. ? Feel tired. ? Feel sore or achy, even in parts of your body where you did not have surgery. This information is not intended to replace advice given to you by your health care provider. Make sure you discuss any questions you have with your health care provider. Document Released: 04/27/2007 Document Revised: 07/01/2015 Document Reviewed: 01/02/2015 Elsevier Interactive Patient Education  2018 [employer] Anesthesia, Adult, Care After These instructions provide you with information about caring for yourself after your procedure. Your health care provider may also give you more specific instructions. Your treatment has been planned according to current medical practices, but problems sometimes occur. Call your health care provider if you have any problems or  questions after your procedure. What can I expect after the procedure? After the procedure, it is common to have:  Vomiting.  A sore throat.  Mental slowness.  It is common to feel:  Nauseous.  Cold or shivery.  Sleepy.  Tired.  Sore or achy, even in parts of your body where you did not have surgery.  Follow these instructions at home: For at least 24 hours after the procedure:  Do not: ? Participate in activities where you could fall or become injured. ? Drive. ? Use heavy machinery. ? Drink alcohol. ? Take sleeping pills or medicines that cause drowsiness. ? Make important decisions or sign legal documents. ? Take care of children on your own.  Rest. Eating and drinking  If you vomit, drink water, juice, or soup when you can drink without vomiting.  Drink enough fluid to keep your urine clear or pale yellow.  Make sure you have little or no nausea before eating solid foods.  Follow the diet recommended by your health care provider. General instructions  Have a responsible adult stay with you until you are awake and alert.  Return to your normal activities as told by your health care provider. Ask your health care provider what activities are safe for you.  Take over-the-counter and prescription medicines only as told by your health care provider.  If you smoke, do not smoke without supervision.  Keep all follow-up visits as told by your health care provider. This is important. Contact a health care provider if:  You continue to have nausea or vomiting at home, and medicines are not helpful.  You cannot drink fluids or start eating again.  You cannot urinate after 8-12 hours.  You develop a skin rash.  You have fever.  You have increasing redness at the site of your procedure. Get help right away if:  You have difficulty breathing.  You have chest pain.  You have unexpected bleeding.  You feel that you are having a life-threatening or  urgent problem. This information is not intended to replace advice given to you by your health care provider. Make sure you discuss any questions you have with your health care provider. Document Released: 04/26/2000 Document Revised: 06/23/2015 Document Reviewed: 01/02/2015 Elsevier Interactive Patient Education  [employer].

## 2017-04-04 ENCOUNTER — Encounter (HOSPITAL_COMMUNITY): Payer: Self-pay

## 2017-04-04 ENCOUNTER — Encounter (HOSPITAL_COMMUNITY): Payer: Self-pay | Admitting: Physical Therapy

## 2017-04-04 ENCOUNTER — Other Ambulatory Visit: Payer: Self-pay

## 2017-04-04 ENCOUNTER — Encounter (HOSPITAL_COMMUNITY)
Admission: RE | Admit: 2017-04-04 | Discharge: 2017-04-04 | Disposition: A | Discharge status: Home / Self Care | Payer: BLUE CROSS/BLUE SHIELD | Source: Ambulatory Visit | Attending: Orthopedic Surgery | Admitting: Orthopedic Surgery

## 2017-04-04 DIAGNOSIS — E785 Hyperlipidemia, unspecified: Secondary | ICD-10-CM | POA: Diagnosis not present

## 2017-04-04 DIAGNOSIS — M24661 Ankylosis, right knee: Secondary | ICD-10-CM | POA: Diagnosis not present

## 2017-04-04 DIAGNOSIS — I4891 Unspecified atrial fibrillation: Secondary | ICD-10-CM | POA: Diagnosis not present

## 2017-04-04 DIAGNOSIS — Z96651 Presence of right artificial knee joint: Secondary | ICD-10-CM | POA: Diagnosis not present

## 2017-04-04 DIAGNOSIS — I10 Essential (primary) hypertension: Secondary | ICD-10-CM | POA: Diagnosis not present

## 2017-04-04 DIAGNOSIS — Z79899 Other long term (current) drug therapy: Secondary | ICD-10-CM | POA: Diagnosis not present

## 2017-04-04 DIAGNOSIS — Z7982 Long term (current) use of aspirin: Secondary | ICD-10-CM | POA: Diagnosis not present

## 2017-04-04 LAB — CBC WITH DIFFERENTIAL/PLATELET
BASOS ABS: 0 10*3/uL (ref 0.0–0.1)
Basophils Relative: 0 %
EOS ABS: 0.1 10*3/uL (ref 0.0–0.7)
Eosinophils Relative: 1 %
HCT: 41.6 % (ref 39.0–52.0)
HEMOGLOBIN: 13.4 g/dL (ref 13.0–17.0)
Lymphocytes Relative: 26 %
Lymphs Abs: 1.4 10*3/uL (ref 0.7–4.0)
MCH: 28.8 pg (ref 26.0–34.0)
MCHC: 32.2 g/dL (ref 30.0–36.0)
MCV: 89.3 fL (ref 78.0–100.0)
Monocytes Absolute: 0.5 10*3/uL (ref 0.1–1.0)
Monocytes Relative: 10 %
NEUTROS PCT: 63 %
Neutro Abs: 3.5 10*3/uL (ref 1.7–7.7)
Platelets: 280 10*3/uL (ref 150–400)
RBC: 4.66 MIL/uL (ref 4.22–5.81)
RDW: 12.8 % (ref 11.5–15.5)
WBC: 5.5 10*3/uL (ref 4.0–10.5)

## 2017-04-04 LAB — COMPREHENSIVE METABOLIC PANEL
ALBUMIN: 4.2 g/dL (ref 3.5–5.0)
ALK PHOS: 99 U/L (ref 38–126)
ALT: 16 U/L — AB (ref 17–63)
ANION GAP: 11 (ref 5–15)
AST: 14 U/L — ABNORMAL LOW (ref 15–41)
BUN: 12 mg/dL (ref 6–20)
CALCIUM: 9.4 mg/dL (ref 8.9–10.3)
CO2: 29 mmol/L (ref 22–32)
Chloride: 101 mmol/L (ref 101–111)
Creatinine, Ser: 0.98 mg/dL (ref 0.61–1.24)
GFR calc Af Amer: >60 mL/min (ref >60)
GFR calc non Af Amer: >60 mL/min (ref >60)
Glucose, Bld: 83 mg/dL (ref 65–99)
Potassium: 3.8 mmol/L (ref 3.5–5.1)
Sodium: 141 mmol/L (ref 135–145)
Total Bilirubin: 0.6 mg/dL (ref 0.3–1.2)
Total Protein: 7.7 g/dL (ref 6.5–8.1)

## 2017-04-04 LAB — SEDIMENTATION RATE: SED RATE: 15 mm/h (ref 0–16)

## 2017-04-04 LAB — C-REACTIVE PROTEIN

## 2017-04-04 NOTE — H&P (Addendum)
ADMIT HISTORY FOR SURGERY    STIFF RIGHT KNEE   57 year old male presents for surgery right knee status post right total knee on January 15.  The patient failed to progress with physical therapy actually losing flexion during the postoperative period after discharge from the hospital.  He presented to the office 1 week ago with a range of motion of 20-60 degrees.  His pain was controlled well with oral opioid hydrocodone.  He did not have any signs of infection.      Review of Systems  Respiratory: Negative.   Cardiovascular: Negative for chest pain, palpitations, orthopnea and leg swelling.  All other systems reviewed and are negative.    Past Medical History:  Diagnosis Date  . Arthritis   . Atrial fibrillation ([place])    a. occuring in the post-operative setting in 02/2017  . HTN (hypertension)   . Hyperlipemia     Past Surgical History:  Procedure Laterality Date  . HERNIA REPAIR Right   . KNEE ARTHROSCOPY  right knee  . TOTAL KNEE ARTHROPLASTY Right 02/15/2017   Procedure: TOTAL KNEE ARTHROPLASTY;  Surgeon: Carole Civil, MD;  Location: [facility];  Service: Orthopedics;  Laterality: Right;    Family History  Problem Relation Age of Onset  . Arthritis Unknown   . Cancer Unknown   . Diabetes Unknown   . Other Mother 15       sepsis  . CAD Father 63  . CAD Brother 83  . Diabetes Brother    Social History   Tobacco Use  . Smoking status: Never Smoker  . Smokeless tobacco: Never Used  Substance Use Topics  . Alcohol use: Yes    Comment: occasional beer  . Drug use: No   Allergies  Allergen Reactions  . Hydrocodone Itching    Current Meds  Medication Sig  . acetaminophen (TYLENOL) 325 MG tablet Take 650 mg by mouth every 6 (six) hours as needed (for pain.).   [name] aspirin EC 81 MG tablet Take 81 mg by mouth daily.  [name] atorvastatin (LIPITOR) 10 MG tablet TAKE 1 TABLET BY MOUTH DAILY (Patient taking differently: TAKE 10 MG BY MOUTH DAILY)  .  HYDROcodone-acetaminophen (NORCO) 5-325 MG tablet Take 1 tablet by mouth every 6 (six) hours as needed for moderate pain.  . methocarbamol (ROBAXIN) 500 MG tablet Take 1 tablet (500 mg total) by mouth every 6 (six) hours as needed for muscle spasms.  . metoprolol tartrate (LOPRESSOR) 25 MG tablet Take 1 tablet (25 mg total) by mouth 2 (two) times daily.      Physical Exam  Constitutional: [name] oriented to person, place, and time. He appears well-developed and well-nourished.  Vital signs have been reviewed and are stable. Gen. appearance the patient is well-developed and well-nourished with normal grooming and hygiene.   Musculoskeletal:  His gait is remarkable for inability to extend his knee which affects his stance and swing through phase.  Neurological: [name] alert and oriented to person, place, and time.  Skin: Skin is warm and dry. No erythema.  Psychiatric: He has a normal mood and affect.  Vitals reviewed.   Ortho Exam   His upper extremities reveal normal alignment, range of motion, stability and strength with normal neurovascular exam skin warm dry and intact no rash lesions ulceration and no palpable lymph nodes in the axilla or epitrochlear regions  The right lower extremity again has a well-healed incision with no swelling or erythema.  His flexion arc is  20-60 degrees.  He has no collateral or anterior posterior ligament instability he can straight leg raise with the extensor lag 20 degrees.  His neurovascular exam is intact he has no peripheral edema  His left lower extremity is aligned normally without contracture subluxation atrophy or tremor except for mild flexion contracture of the knee   Diagnosis arthrofibrosis status post right total knee  Plan  Patient has been counseled on treatment options including further physical therapy versus manipulation under anesthesia to improve flexion and extension.  He understands that there is a fracture risk.  [name] opted for  manipulation under anesthesia right knee with option to perform arthrotomy and tibial revision right knee if necessary pending results of manipulation  Right knee   CBC Latest Ref Rng & Units 04/04/2017 02/17/2017 02/16/2017  WBC 4.0 - 10.5 K/uL 5.5 16.6(H) 9.5  Hemoglobin 13.0 - 17.0 g/dL 13.4 11.7(L) 12.1(L)  Hematocrit 39.0 - 52.0 % 41.6 34.8(L) 35.8(L)  Platelets 150 - 400 K/uL 280 196 200   BMP Latest Ref Rng & Units 04/04/2017 02/16/2017 02/09/2017  Glucose 65 - 99 mg/dL 83 115(H) 138(H)  BUN 6 - 20 mg/dL 12 11 17   Creatinine 0.61 - 1.24 mg/dL 0.98 1.15 1.11  BUN/Creat Ratio 9 - 20 - - -  Sodium 135 - 145 mmol/L 141 138 142  Potassium 3.5 - 5.1 mmol/L 3.8 3.5 3.6  Chloride 101 - 111 mmol/L 101 103 104  CO2 22 - 32 mmol/L 29 26 27   Calcium 8.9 - 10.3 mg/dL 9.4 8.1(L) 9.3    Sed rate was 15 C-reactive protein is pending.

## 2017-04-05 ENCOUNTER — Ambulatory Visit (HOSPITAL_COMMUNITY): Payer: BLUE CROSS/BLUE SHIELD | Admitting: Anesthesiology

## 2017-04-05 ENCOUNTER — Other Ambulatory Visit: Payer: Self-pay

## 2017-04-05 ENCOUNTER — Encounter (HOSPITAL_COMMUNITY): Payer: Self-pay | Admitting: *Deleted

## 2017-04-05 ENCOUNTER — Observation Stay (HOSPITAL_COMMUNITY)
Admission: RE | Admit: 2017-04-05 | Discharge: 2017-04-06 | Disposition: A | Discharge status: Home / Self Care | Payer: BLUE CROSS/BLUE SHIELD | Source: Ambulatory Visit | Attending: Orthopedic Surgery | Admitting: Orthopedic Surgery

## 2017-04-05 ENCOUNTER — Telehealth (HOSPITAL_COMMUNITY): Payer: Self-pay | Admitting: Physical Therapy

## 2017-04-05 ENCOUNTER — Encounter (HOSPITAL_COMMUNITY)
Admission: RE | Disposition: A | Discharge status: Home / Self Care | Payer: Self-pay | Source: Ambulatory Visit | Attending: Orthopedic Surgery

## 2017-04-05 DIAGNOSIS — Z7982 Long term (current) use of aspirin: Secondary | ICD-10-CM | POA: Insufficient documentation

## 2017-04-05 DIAGNOSIS — Z96651 Presence of right artificial knee joint: Secondary | ICD-10-CM | POA: Insufficient documentation

## 2017-04-05 DIAGNOSIS — Z79899 Other long term (current) drug therapy: Secondary | ICD-10-CM | POA: Insufficient documentation

## 2017-04-05 DIAGNOSIS — E785 Hyperlipidemia, unspecified: Secondary | ICD-10-CM | POA: Insufficient documentation

## 2017-04-05 DIAGNOSIS — M24661 Ankylosis, right knee: Principal | ICD-10-CM | POA: Diagnosis present

## 2017-04-05 DIAGNOSIS — I10 Essential (primary) hypertension: Secondary | ICD-10-CM | POA: Insufficient documentation

## 2017-04-05 DIAGNOSIS — I4891 Unspecified atrial fibrillation: Secondary | ICD-10-CM | POA: Insufficient documentation

## 2017-04-05 HISTORY — PX: EXAM UNDER ANESTHESIA WITH MANIPULATION OF KNEE: SHX5816

## 2017-04-05 SURGERY — MANIPULATION, JOINT, KNEE, WITH ANESTHESIA
Anesthesia: General | Site: Knee | Laterality: Right

## 2017-04-05 MED ORDER — PROPOFOL 10 MG/ML IV BOLUS
INTRAVENOUS | Status: AC
  Filled 2017-04-05: qty 40

## 2017-04-05 MED ORDER — ACETAMINOPHEN 500 MG PO TABS
1000.0000 mg | ORAL_TABLET | Freq: Four times a day (QID) | ORAL | Status: AC
  Administered 2017-04-05 – 2017-04-06 (×4): 1000 mg via ORAL
  Filled 2017-04-05 (×4): qty 2

## 2017-04-05 MED ORDER — OXYCODONE HCL 5 MG PO TABS
5.0000 mg | ORAL_TABLET | Freq: Once | ORAL | Status: AC
  Administered 2017-04-05: 5 mg via ORAL
  Filled 2017-04-05: qty 1

## 2017-04-05 MED ORDER — LIDOCAINE HCL (CARDIAC) 10 MG/ML IV SOLN
INTRAVENOUS | Status: DC | PRN
  Administered 2017-04-05: 50 mg via INTRAVENOUS

## 2017-04-05 MED ORDER — ONDANSETRON HCL 4 MG/2ML IJ SOLN
4.0000 mg | Freq: Once | INTRAMUSCULAR | Status: AC
  Administered 2017-04-05: 4 mg via INTRAVENOUS
  Filled 2017-04-05: qty 2

## 2017-04-05 MED ORDER — ACETAMINOPHEN 325 MG PO TABS: 325.0000 mg | ORAL_TABLET | Freq: Four times a day (QID) | ORAL | Status: DC | PRN

## 2017-04-05 MED ORDER — METHOCARBAMOL 1000 MG/10ML IJ SOLN
500.0000 mg | Freq: Once | INTRAVENOUS | Status: AC
  Administered 2017-04-05: 500 mg via INTRAVENOUS
  Filled 2017-04-05: qty 5

## 2017-04-05 MED ORDER — LACTATED RINGERS IV SOLN
INTRAVENOUS | Status: DC
  Administered 2017-04-05 (×2): via INTRAVENOUS

## 2017-04-05 MED ORDER — OXYCODONE HCL 5 MG PO TABS: 10.0000 mg | ORAL_TABLET | ORAL | Status: DC | PRN

## 2017-04-05 MED ORDER — SUCCINYLCHOLINE CHLORIDE 20 MG/ML IJ SOLN
INTRAMUSCULAR | Status: DC | PRN
  Administered 2017-04-05: 120 mg via INTRAVENOUS

## 2017-04-05 MED ORDER — LIDOCAINE HCL (PF) 1 % IJ SOLN
INTRAMUSCULAR | Status: AC
  Filled 2017-04-05: qty 10

## 2017-04-05 MED ORDER — SUCCINYLCHOLINE CHLORIDE 20 MG/ML IJ SOLN
INTRAMUSCULAR | Status: AC
  Filled 2017-04-05: qty 1

## 2017-04-05 MED ORDER — FENTANYL CITRATE (PF) 100 MCG/2ML IJ SOLN
INTRAMUSCULAR | Status: DC | PRN
  Administered 2017-04-05: 100 ug via INTRAVENOUS

## 2017-04-05 MED ORDER — ONDANSETRON HCL 4 MG/2ML IJ SOLN: 4.0000 mg | Freq: Four times a day (QID) | INTRAMUSCULAR | Status: DC | PRN

## 2017-04-05 MED ORDER — ROCURONIUM BROMIDE 100 MG/10ML IV SOLN
INTRAVENOUS | Status: DC | PRN
  Administered 2017-04-05: 5 mg via INTRAVENOUS

## 2017-04-05 MED ORDER — ASPIRIN EC 81 MG PO TBEC
81.0000 mg | DELAYED_RELEASE_TABLET | Freq: Every day | ORAL | Status: DC
  Administered 2017-04-05 – 2017-04-06 (×2): 81 mg via ORAL
  Filled 2017-04-05 (×2): qty 1

## 2017-04-05 MED ORDER — CELECOXIB 100 MG PO CAPS
200.0000 mg | ORAL_CAPSULE | Freq: Two times a day (BID) | ORAL | Status: DC
  Administered 2017-04-05 – 2017-04-06 (×3): 200 mg via ORAL
  Filled 2017-04-05 (×3): qty 2

## 2017-04-05 MED ORDER — MENTHOL 3 MG MT LOZG: 1.0000 | LOZENGE | OROMUCOSAL | Status: DC | PRN

## 2017-04-05 MED ORDER — OXYCODONE HCL ER 10 MG PO T12A
10.0000 mg | EXTENDED_RELEASE_TABLET | Freq: Two times a day (BID) | ORAL | Status: DC
  Administered 2017-04-05 – 2017-04-06 (×3): 10 mg via ORAL
  Filled 2017-04-05 (×3): qty 1

## 2017-04-05 MED ORDER — DEXAMETHASONE SODIUM PHOSPHATE 4 MG/ML IJ SOLN
4.0000 mg | Freq: Once | INTRAMUSCULAR | Status: AC
  Administered 2017-04-05: 4 mg via INTRAVENOUS
  Filled 2017-04-05: qty 1

## 2017-04-05 MED ORDER — METOPROLOL TARTRATE 25 MG PO TABS
25.0000 mg | ORAL_TABLET | Freq: Two times a day (BID) | ORAL | Status: DC
  Administered 2017-04-05 – 2017-04-06 (×2): 25 mg via ORAL
  Filled 2017-04-05 (×3): qty 1

## 2017-04-05 MED ORDER — PROPOFOL 10 MG/ML IV BOLUS
INTRAVENOUS | Status: DC | PRN
  Administered 2017-04-05: 200 mg via INTRAVENOUS

## 2017-04-05 MED ORDER — LIDOCAINE HCL (PF) 1 % IJ SOLN
INTRAMUSCULAR | Status: AC
  Filled 2017-04-05: qty 5

## 2017-04-05 MED ORDER — METOCLOPRAMIDE HCL 5 MG/ML IJ SOLN: 5.0000 mg | Freq: Three times a day (TID) | INTRAMUSCULAR | Status: DC | PRN

## 2017-04-05 MED ORDER — ROCURONIUM BROMIDE 50 MG/5ML IV SOLN
INTRAVENOUS | Status: AC
  Filled 2017-04-05: qty 1

## 2017-04-05 MED ORDER — ONDANSETRON HCL 4 MG PO TABS: 4.0000 mg | ORAL_TABLET | Freq: Four times a day (QID) | ORAL | Status: DC | PRN

## 2017-04-05 MED ORDER — PHENOL 1.4 % MT LIQD: 1.0000 | OROMUCOSAL | Status: DC | PRN

## 2017-04-05 MED ORDER — KETOROLAC TROMETHAMINE 15 MG/ML IJ SOLN
15.0000 mg | Freq: Four times a day (QID) | INTRAMUSCULAR | Status: AC
  Administered 2017-04-05 – 2017-04-06 (×4): 15 mg via INTRAVENOUS
  Filled 2017-04-05 (×4): qty 1

## 2017-04-05 MED ORDER — CHLORHEXIDINE GLUCONATE 4 % EX LIQD: 60.0000 mL | Freq: Once | CUTANEOUS | Status: DC

## 2017-04-05 MED ORDER — METHYLPREDNISOLONE SODIUM SUCC 125 MG IJ SOLR
60.0000 mg | Freq: Once | INTRAMUSCULAR | Status: AC
  Administered 2017-04-05: 60 mg via INTRAVENOUS
  Filled 2017-04-05: qty 2

## 2017-04-05 MED ORDER — HYDRALAZINE HCL 20 MG/ML IJ SOLN
INTRAMUSCULAR | Status: DC | PRN
  Administered 2017-04-05: 5 mg via INTRAVENOUS

## 2017-04-05 MED ORDER — OXYCODONE HCL 5 MG PO TABS: 5.0000 mg | ORAL_TABLET | ORAL | Status: DC | PRN

## 2017-04-05 MED ORDER — METHOCARBAMOL 500 MG PO TABS: 500.0000 mg | ORAL_TABLET | Freq: Four times a day (QID) | ORAL | Status: DC | PRN

## 2017-04-05 MED ORDER — HYDROMORPHONE HCL 1 MG/ML IJ SOLN
0.2500 mg | INTRAMUSCULAR | Status: DC | PRN
  Administered 2017-04-05 (×2): 0.5 mg via INTRAVENOUS
  Filled 2017-04-05 (×2): qty 0.5

## 2017-04-05 MED ORDER — DOCUSATE SODIUM 100 MG PO CAPS: 100.0000 mg | ORAL_CAPSULE | Freq: Two times a day (BID) | ORAL | Status: DC

## 2017-04-05 MED ORDER — MIDAZOLAM HCL 2 MG/2ML IJ SOLN
1.0000 mg | INTRAMUSCULAR | Status: DC
  Administered 2017-04-05: 2 mg via INTRAVENOUS
  Filled 2017-04-05: qty 2

## 2017-04-05 MED ORDER — FENTANYL CITRATE (PF) 100 MCG/2ML IJ SOLN
INTRAMUSCULAR | Status: AC
  Filled 2017-04-05: qty 2

## 2017-04-05 MED ORDER — TRAMADOL HCL 50 MG PO TABS
50.0000 mg | ORAL_TABLET | Freq: Four times a day (QID) | ORAL | Status: DC
  Administered 2017-04-05 – 2017-04-06 (×4): 50 mg via ORAL
  Filled 2017-04-05 (×4): qty 1

## 2017-04-05 MED ORDER — CELECOXIB 400 MG PO CAPS
400.0000 mg | ORAL_CAPSULE | Freq: Once | ORAL | Status: AC
  Administered 2017-04-05: 400 mg via ORAL
  Filled 2017-04-05: qty 1

## 2017-04-05 MED ORDER — GABAPENTIN 300 MG PO CAPS
300.0000 mg | ORAL_CAPSULE | Freq: Three times a day (TID) | ORAL | Status: DC
  Administered 2017-04-05 – 2017-04-06 (×3): 300 mg via ORAL
  Filled 2017-04-05 (×4): qty 1

## 2017-04-05 MED ORDER — HYDRALAZINE HCL 20 MG/ML IJ SOLN
5.0000 mg | Freq: Once | INTRAMUSCULAR | Status: AC
  Administered 2017-04-05: 5 mg via INTRAVENOUS

## 2017-04-05 MED ORDER — DOCUSATE SODIUM 100 MG PO CAPS
100.0000 mg | ORAL_CAPSULE | Freq: Two times a day (BID) | ORAL | Status: DC
  Administered 2017-04-05 – 2017-04-06 (×3): 100 mg via ORAL
  Filled 2017-04-05 (×3): qty 1

## 2017-04-05 MED ORDER — ATORVASTATIN CALCIUM 10 MG PO TABS
10.0000 mg | ORAL_TABLET | Freq: Every day | ORAL | Status: DC
  Administered 2017-04-06: 10 mg via ORAL
  Filled 2017-04-05 (×2): qty 1

## 2017-04-05 MED ORDER — HYDROMORPHONE HCL 1 MG/ML IJ SOLN
INTRAMUSCULAR | Status: AC
  Filled 2017-04-05: qty 0.5

## 2017-04-05 MED ORDER — HYDRALAZINE HCL 20 MG/ML IJ SOLN
INTRAMUSCULAR | Status: AC
  Filled 2017-04-05: qty 1

## 2017-04-05 MED ORDER — METOCLOPRAMIDE HCL 10 MG PO TABS: 5.0000 mg | ORAL_TABLET | Freq: Three times a day (TID) | ORAL | Status: DC | PRN

## 2017-04-05 MED ORDER — HYDROMORPHONE HCL 1 MG/ML IJ SOLN: 0.5000 mg | INTRAMUSCULAR | Status: DC | PRN

## 2017-04-05 MED ORDER — PREGABALIN 50 MG PO CAPS
50.0000 mg | ORAL_CAPSULE | Freq: Once | ORAL | Status: AC
Start: 2017-04-05 — End: 2017-04-05
  Administered 2017-04-05: 50 mg via ORAL
  Filled 2017-04-05: qty 1

## 2017-04-05 SURGICAL SUPPLY — 3 items
BAG HAMPER (MISCELLANEOUS) ×3 IMPLANT
KIT TURNOVER KIT A (KITS) ×3 IMPLANT
PAD ARMBOARD 7.5X6 YLW CONV (MISCELLANEOUS) ×3 IMPLANT

## 2017-04-05 NOTE — Evaluation (Signed)
Physical Therapy Evaluation Patient Details Name: Christopher [name] MRN: [id number] DOB: [birth year] Today's Date: 04/05/2017    RIGHT KNEE PROM: 10-90 degrees AMBULATION DISTANCE: 120 feet using cane with Supervision    History of Present Illness  Christopher [name] is a 57 y/o male s/p EXAM UNDER ANESTHESIA WITH MANIPULATION OF KNEE (Right) with the diagnosis of arthrofibrosis right total knee replacement, history of Right TKA 02/15/17  with the diagnosis of primary osteoarthritis right knee    Clinical Impression  Patient functioning near baseline for functional mobility and gait other than demonstrating slow slightly labored cadence during gait training with limited right heel to toe stepping due to stiffness in right knee.  Patient tolerated exercises well with pain level 3/5 and tolerated sitting in chair with RLE elevated on bedside to increase right knee extension after therapy.  Patient will benefit from continued physical therapy in hospital and recommended venue below to increase strength, balance, endurance for safe ADLs and gait.    Follow Up Recommendations Home health PT    Equipment Recommendations  None recommended by PT    Recommendations for Other Services       Precautions / Restrictions Precautions Precautions: Fall Precaution Comments: s/p right MUA Restrictions Weight Bearing Restrictions: No      Mobility  Bed Mobility Overal bed mobility: Modified Independent                Transfers Overall transfer level: Modified independent Equipment used: Straight cane                Ambulation/Gait Ambulation/Gait assistance: Supervision Ambulation Distance (Feet): 120 Feet Assistive device: Straight cane Gait Pattern/deviations: Decreased step length - right;Decreased stance time - right;Decreased stride length   Gait velocity interpretation: Below normal speed for age/gender General Gait Details: Demonstrates slow labored cadence with 3 point gait  pattern using cane, fair return for right heel to toe stepping, c/o mild 3/5 right knee pain when walking  Stairs            Wheelchair Mobility    Modified Rankin (Stroke Patients Only)       Balance Overall balance assessment: Mild deficits observed, not formally tested                                           Pertinent Vitals/Pain Pain Assessment: 0-10 Pain Score: 3  Pain Location: right knee Pain Descriptors / Indicators: Aching Pain Intervention(s): Limited activity within patient's tolerance;Monitored during session    Home Living Family/patient expects to be discharged to:: Private residence Living Arrangements: Spouse/significant other Available Help at Discharge: Family Type of Home: House Home Access: Stairs to enter Entrance Stairs-Rails: Right Entrance Stairs-Number of Steps: 1 Home Layout: Multi-level Home Equipment: Cane - single point;Crutches;Walker - 2 wheels      Prior Function Level of Independence: Independent with assistive device(s)         Comments: Ambulating community distances with SPC     Hand Dominance        Extremity/Trunk Assessment   Upper Extremity Assessment Upper Extremity Assessment: Overall WFL for tasks assessed    Lower Extremity Assessment Lower Extremity Assessment: Overall WFL for tasks assessed;RLE deficits/detail RLE Deficits / Details: RLE grossly 4+/5 except right knee grossly 3+/5    Cervical / Trunk Assessment Cervical / Trunk Assessment: Normal  Communication   Communication: No difficulties  Cognition  Arousal/Alertness: Awake/alert Behavior During Therapy: WFL for tasks assessed/performed Overall Cognitive Status: Within Functional Limits for tasks assessed                                        General Comments      Exercises Other Exercises Other Exercises: quad sets with right LLE held at heels by therapist for distraction, 3 sets x 1 minute  holds Other Exercises: seated right knee flexion using LLE to pull right foot towards body while sliding right hip forward to increase stretch Other Exercises: seated in chair right quad sets with right heel elevated on bed, 3 sets x 1 minute holds   Assessment/Plan    PT Assessment Patient needs continued PT services  PT Problem List Decreased strength;Decreased range of motion;Decreased activity tolerance;Decreased mobility;Decreased balance       PT Treatment Interventions Gait training;Stair training;Functional mobility training;Therapeutic activities;Therapeutic exercise    PT Goals (Current goals can be found in the Care Plan section)  Acute Rehab PT Goals Patient Stated Goal: increase right knee ROM to WNLs PT Goal Formulation: With patient Time For Goal Achievement: 04/08/17 Potential to Achieve Goals: Good    Frequency 7X/week   Barriers to discharge        Co-evaluation               AM-PAC PT "6 Clicks" Daily Activity  Outcome Measure Difficulty turning over in bed (including adjusting bedclothes, sheets and blankets)?: None Difficulty moving from lying on back to sitting on the side of the bed? : None Difficulty sitting down on and standing up from a chair with arms (e.g., wheelchair, bedside commode, etc,.)?: None Help needed moving to and from a bed to chair (including a wheelchair)?: None Help needed walking in hospital room?: A Little Help needed climbing 3-5 steps with a railing? : A Little 6 Click Score: 22    End of Session   Activity Tolerance: Patient tolerated treatment well Patient left: in chair;with call bell/phone within reach(with right heel elevated on bedside to stretch knee) Nurse Communication: Mobility status PT Visit Diagnosis: Unsteadiness on feet (R26.81);Other abnormalities of gait and mobility (R26.89);Muscle weakness (generalized) (M62.81)    Time: 6712-4580 PT Time Calculation (min) (ACUTE ONLY): 28 min   Charges:   PT  Evaluation $PT Eval Moderate Complexity: 1 Mod PT Treatments $Therapeutic Activity: 23-37 mins   PT G Codes:        12:21 PM, 04-22-2017 Lonell Grandchild, MPT Physical Therapist with [facility] [phone] office [phone] mobile phone  22-Apr-2017, 12:17 PM

## 2017-04-05 NOTE — Brief Op Note (Signed)
04/05/2017  7:48 AM  PATIENT:  Christopher [name]  57 y.o. male  PRE-OPERATIVE DIAGNOSIS:  arthrofibrosis right total knee replacement  POST-OPERATIVE DIAGNOSIS:  arthrofibrosis right total knee replacement  Findings: Preop rom under anesthesia: 10-60                 Post op rom <5 - 90 passive flexion test, prom  <5-110  PROCEDURE:  Procedure(s): EXAM UNDER ANESTHESIA WITH MANIPULATION OF KNEE (Right)  This patient was identified in the preop area surgical site was confirmed chart was reviewed site was marked right knee  Patient was taken to surgery for general anesthesia  After general anesthesia and timeout the  Preop range of motion was measured 10-60 degrees  After manipulation postop range of motion was less than 5-90 degrees passive flexion test and passive range of motion was less than 5 degrees - 110 degrees   SURGEON:  Surgeon(s) and Role:    * Carole Civil, MD - Primary  PHYSICIAN ASSISTANT:   ASSISTANTS: none   ANESTHESIA:   general  EBL:  0 mL   BLOOD ADMINISTERED:none  DRAINS: none   LOCAL MEDICATIONS USED:  NONE  SPECIMEN:  No Specimen  DISPOSITION OF SPECIMEN:  N/A  COUNTS:  YES  TOURNIQUET:  * Missing tourniquet times found for documented tourniquets in log: [id number] *  DICTATION: .Dragon Dictation  PLAN OF CARE: Admit for overnight observation  PATIENT DISPOSITION:  PACU - hemodynamically stable.   Delay start of Pharmacological VTE agent (>24hrs) due to surgical blood loss or risk of bleeding: not applicable

## 2017-04-05 NOTE — Telephone Encounter (Signed)
Pt in hospital for observation and will not be here in the morning

## 2017-04-05 NOTE — Transfer of Care (Signed)
Immediate Anesthesia Transfer of Care Note  Patient: Christopher [name]  Procedure(s) Performed: EXAM UNDER ANESTHESIA WITH MANIPULATION OF KNEE (Right Knee)  Patient Location: PACU  Anesthesia Type:General  Level of Consciousness: awake, alert  and patient cooperative  Airway & Oxygen Therapy: Patient Spontanous Breathing and Patient connected to nasal cannula oxygen  Post-op Assessment: Report given to RN and Post -op Vital signs reviewed and stable  Post vital signs: Reviewed and stable  Last Vitals:  Vitals:   04/05/17 0720 04/05/17 0804  BP:    Pulse:    Resp: (!) 22   Temp:  36.7 C  SpO2: 100%     Last Pain:  Vitals:   04/05/17 0634  TempSrc: Oral  PainSc: 4       Patients Stated Pain Goal: 6 (48/88/91 6945)  Complications: No apparent anesthesia complications

## 2017-04-05 NOTE — Anesthesia Postprocedure Evaluation (Signed)
Anesthesia Post Note  Patient: [name]  Procedure(s) Performed: EXAM UNDER ANESTHESIA WITH MANIPULATION OF KNEE (Right Knee)  Patient location during evaluation: PACU Anesthesia Type: General Level of consciousness: awake and alert and patient cooperative Pain management: satisfactory to patient Vital Signs Assessment: post-procedure vital signs reviewed and stable Respiratory status: spontaneous breathing and patient connected to nasal cannula oxygen Cardiovascular status: stable Postop Assessment: no apparent nausea or vomiting Anesthetic complications: no     Last Vitals:  Vitals:   04/05/17 0815 04/05/17 0830  BP: (!) 173/101 (!) 162/78  Pulse: 79 70  Resp: 17 19  Temp:    SpO2: 100% 100%    Last Pain:  Vitals:   04/05/17 0804  TempSrc:   PainSc: 8                  [name]

## 2017-04-05 NOTE — Interval H&P Note (Signed)
History and Physical Interval Note:  04/05/2017 7:19 AM  Christopher [name]  has presented today for surgery, with the diagnosis of arthrofibrosis right total knee replacement  The various methods of treatment have been discussed with the patient and family. After consideration of risks, benefits and other options for treatment, the patient has consented to  Procedure(s): EXAM UNDER ANESTHESIA WITH MANIPULATION OF KNEE (Right) TOTAL KNEE ARTHROPLASTY WITH REVISION COMPONENTS (Right) as a surgical intervention .  The patient's history has been reviewed, patient examined, no change in status, stable for surgery.  I have reviewed the patient's chart and labs.  Questions were answered to the patient's satisfaction.     [name]

## 2017-04-05 NOTE — Anesthesia Procedure Notes (Signed)
Procedure Name: Intubation Date/Time: 04/05/2017 7:36 AM Performed by: Vista Deck, CRNA Pre-anesthesia Checklist: Patient identified, Patient being monitored, Timeout performed, Emergency Drugs available and Suction available Patient Re-evaluated:Patient Re-evaluated prior to induction Oxygen Delivery Method: Circle System Utilized Preoxygenation: Pre-oxygenation with 100% oxygen Induction Type: IV induction Ventilation: Mask ventilation without difficulty Laryngoscope Size: Mac and 3 Grade View: Grade II Tube type: Oral Tube size: 7.0 mm Number of attempts: 1 Airway Equipment and Method: stylet and Oral airway Placement Confirmation: ETT inserted through vocal cords under direct vision,  positive ETCO2 and breath sounds checked- equal and bilateral Secured at: 23 cm Tube secured with: Tape Dental Injury: Teeth and Oropharynx as per pre-operative assessment

## 2017-04-05 NOTE — Plan of Care (Signed)
  Acute Rehab PT Goals(only PT should resolve) Pt Will Ambulate 04/05/2017 1222 - Progressing by Lonell Grandchild, PT Flowsheets Taken 04/05/2017 1222  Pt will Ambulate > 125 feet;with modified independence;with cane Pt Will Go Up/Down Stairs 04/05/2017 1222 - Progressing by Lonell Grandchild, PT Flowsheets Taken 04/05/2017 1222  Pt will Go Up / Down Stairs 6-9 stairs;with modified independence;with cane Pt/caregiver will Perform Home Exercise Program 04/05/2017 1222 - Progressing by Lonell Grandchild, PT Flowsheets Taken 04/05/2017 1222  Pt/caregiver will Perform Home Exercise Program Independently;For increased ROM;For increased strengthening;For improved balance  12:23 PM, 04/05/17 Lonell Grandchild, MPT Physical Therapist with [facility] [phone] office [phone] mobile phone

## 2017-04-05 NOTE — Anesthesia Preprocedure Evaluation (Signed)
Anesthesia Evaluation  Patient identified by MRN, date of birth, ID band Patient awake    Reviewed: Allergy & Precautions, NPO status , Patient's Chart, lab work & pertinent test results  Airway Mallampati: II  TM Distance: >3 FB     Dental  (+) Teeth Intact   Pulmonary neg pulmonary ROS,    breath sounds clear to auscultation       Cardiovascular hypertension, Pt. on medications  Rhythm:Regular Rate:Bradycardia     Neuro/Psych negative neurological ROS  negative psych ROS   GI/Hepatic negative GI ROS, Neg liver ROS,   Endo/Other  negative endocrine ROS  Renal/GU negative Renal ROS     Musculoskeletal  (+) Arthritis , Osteoarthritis,    Abdominal   Peds  Hematology negative hematology ROS (+)   Anesthesia Other Findings   Reproductive/Obstetrics                             Anesthesia Physical Anesthesia Plan  ASA: II  Anesthesia Plan: General   Post-op Pain Management:    Induction: Intravenous  PONV Risk Score and Plan:   Airway Management Planned: Oral ETT  Additional Equipment:   Intra-op Plan:   Post-operative Plan: Extubation in OR  Informed Consent: I have reviewed the patients History and Physical, chart, labs and discussed the procedure including the risks, benefits and alternatives for the proposed anesthesia with the patient or authorized representative who has indicated his/her understanding and acceptance.     Plan Discussed with:   Anesthesia Plan Comments:         Anesthesia Quick Evaluation

## 2017-04-05 NOTE — Op Note (Signed)
04/05/2017  7:48 AM  PATIENT:  Christopher [name]  57 y.o. male  PRE-OPERATIVE DIAGNOSIS:  arthrofibrosis right total knee replacement  POST-OPERATIVE DIAGNOSIS:  arthrofibrosis right total knee replacement  Findings: Preop rom under anesthesia: 10-60                 Post op rom <5 - 90 passive flexion test, prom  <5-110  PROCEDURE:  Procedure(s): EXAM UNDER ANESTHESIA WITH MANIPULATION OF KNEE (Right)  This patient was identified in the preop area surgical site was confirmed chart was reviewed site was marked right knee  Patient was taken to surgery for general anesthesia  After general anesthesia and timeout the  Preop range of motion was measured 10-60 degrees  After manipulation postop range of motion was less than 5-90 degrees passive flexion test and passive range of motion was less than 5 degrees - 110 degrees   SURGEON:  Surgeon(s) and Role:    * Carole Civil, MD - Primary  PHYSICIAN ASSISTANT:   ASSISTANTS: none   ANESTHESIA:   general  EBL:  0 mL   BLOOD ADMINISTERED:none  DRAINS: none   LOCAL MEDICATIONS USED:  NONE  SPECIMEN:  No Specimen  DISPOSITION OF SPECIMEN:  N/A  COUNTS:  YES  TOURNIQUET:  * Missing tourniquet times found for documented tourniquets in log: [id number] *  DICTATION: .Dragon Dictation  PLAN OF CARE: Admit for overnight observation  PATIENT DISPOSITION:  PACU - hemodynamically stable.   Delay start of Pharmacological VTE agent (>24hrs) due to surgical blood loss or risk of bleeding: not applicable

## 2017-04-06 ENCOUNTER — Telehealth: Payer: Self-pay | Admitting: Orthopedic Surgery

## 2017-04-06 ENCOUNTER — Ambulatory Visit (HOSPITAL_COMMUNITY): Payer: BLUE CROSS/BLUE SHIELD | Admitting: Physical Therapy

## 2017-04-06 ENCOUNTER — Encounter (HOSPITAL_COMMUNITY): Payer: Self-pay | Admitting: Orthopedic Surgery

## 2017-04-06 DIAGNOSIS — M24661 Ankylosis, right knee: Secondary | ICD-10-CM | POA: Diagnosis not present

## 2017-04-06 MED ORDER — OXYCODONE-ACETAMINOPHEN 5-325 MG PO TABS: 1.0000 | ORAL_TABLET | ORAL | 0 refills | Status: DC | PRN

## 2017-04-06 NOTE — Care Management Note (Signed)
Case Management Note  Patient Details  Name: Christopher [name] MRN: [id number] Date of Birth: [birth year]  Subjective/Objective:   S/p knee manipulation on 04/05/2017.  TKA on 02/16/2047.              Action/Plan: [facility] will call patient today and schedule delivery of CPM for today. Patient reports he has been going to OP PT and will continue at [facility].   Expected Discharge Date:  04/06/17               Expected Discharge Plan:  Home/Self Care  In-House Referral:     Discharge planning Services  CM Consult  Post Acute Care Choice:  Durable Medical Equipment Choice offered to:  Patient  DME Arranged:  Continuous passive motion machine DME Agency:  [facility]  HH Arranged:    [place] Agency:     Status of Service:  Completed, signed off  If discussed at [employer] of Stay Meetings, dates discussed:    Additional Comments:  Mardy Lucier, Chauncey Reading, RN 04/06/2017, 8:11 AM

## 2017-04-06 NOTE — Telephone Encounter (Signed)
Call received from Sonia Side, Medical sales representative at [facility], to relay to Dr Aline Brochure that "they tried very hard" during home therapy visits and that patient was making some, but "not great progress". Call him if need to further discuss at ph#[phone].

## 2017-04-06 NOTE — Plan of Care (Signed)
Pt's pain level was at at 1/10 when I administered his scheduled pain medication

## 2017-04-06 NOTE — Discharge Summary (Signed)
Physician Discharge Summary  Patient ID: Christopher [name] MRN: [id number] DOB/AGE: 56-05-1960 56 y.o.  Admit date: 04/05/2017 Discharge date: 04/06/2017  Admission Diagnoses: Arthrofibrosis right knee  Discharge Diagnoses: Same Active Problems:   Arthrofibrosis of knee joint, right  Discharge condition stable  Hospital course patient was admitted on March 5 for manipulation under anesthesia right knee.  His range of motion preop was 10-60 postop <5-1 10.  He was placed in a CPM machine overnight and kept in the hospital for pain control.  Pain was well controlled with a pain level 3 out of 10 primarily using oxycodone.  He was discharged on March 6 afebrile stable vital signs ambulating 100 feet range of motion 10-90 measured by physical therapy   Discharge Exam: Blood pressure 131/77, pulse 62, temperature 98.2 F (36.8 C), temperature source Oral, resp. rate 18, height 5\' 11"  (1.803 m), weight 182 lb (82.6 kg), SpO2 99 %.   Disposition: 06-Home-Health Care Svc  Discharge Instructions    Call MD / Call 911   Complete by:  As directed    If you experience chest pain or shortness of breath, CALL 911 and be transported to the hospital emergency room.  If you develope a fever above 101 F, pus (white drainage) or increased drainage or redness at the wound, or calf pain, call your surgeon's office.   Constipation Prevention   Complete by:  As directed    Drink plenty of fluids.  Prune juice may be helpful.  You may use a stool softener, such as Colace (over the counter) 100 mg twice a day.  Use MiraLax (over the counter) for constipation as needed.   Diet - low sodium heart healthy   Complete by:  As directed    Discharge instructions   Complete by:  As directed    USE BONE FOAM 3 X A DAY FOR 30 MIN   USE CPM MACHINE 0-90 8 HRS A DAY  INCREASE 10 DEGREES PER DAY UNTIL 120 REACHED   Do not put a pillow under the knee. Place it under the heel.   Complete by:  As directed    Driving  restrictions   Complete by:  As directed    NO DRIVING X 1 WEEK   Increase activity slowly as tolerated   Complete by:  As directed      Allergies as of 04/06/2017      Reactions   Hydrocodone Itching      Medication List    STOP taking these medications   acetaminophen 325 MG tablet Commonly known as:  TYLENOL   HYDROcodone-acetaminophen 5-325 MG tablet Commonly known as:  NORCO     TAKE these medications   amLODipine 5 MG tablet Commonly known as:  NORVASC Take 1 tablet (5 mg total) by mouth daily.   aspirin EC 81 MG tablet Take 81 mg by mouth daily.   atorvastatin 10 MG tablet Commonly known as:  LIPITOR TAKE 1 TABLET BY MOUTH DAILY What changed:    how much to take  how to take this  when to take this   diclofenac 75 MG EC tablet Commonly known as:  VOLTAREN Take 1 tablet (75 mg total) by mouth 2 (two) times daily.   docusate sodium 100 MG capsule Commonly known as:  COLACE Take 1 capsule (100 mg total) by mouth 2 (two) times daily.   methocarbamol 500 MG tablet Commonly known as:  ROBAXIN Take 1 tablet (500 mg total) by mouth every 6 (  six) hours as needed for muscle spasms.   metoprolol tartrate 25 MG tablet Commonly known as:  LOPRESSOR Take 1 tablet (25 mg total) by mouth 2 (two) times daily.   oxyCODONE-acetaminophen 5-325 MG tablet Commonly known as:  PERCOCET/ROXICET Take 1 tablet by mouth every 4 (four) hours as needed for severe pain.            Durable Medical Equipment  (From admission, onward)        Start     Ordered   04/06/17 0732  For Home Use Only DME CPM  Once    Question Answer Comment  Laterality Right Knee   Length of Need 3 WEEKS   Starting Flexion 0   Ending Flexion 90   Increase by Daily 10   Surgery Date 04/05/2017   CPM Started 04/05/2017      04/06/17 2841     Follow-up Information    Carole Civil, MD Follow up on 04/19/2017.   Specialties:  Orthopedic Surgery, Radiology Why:  CK ROM Contact  information: [address] [place] [address] [phone]           Signed: [name] 04/06/2017, 7:36 AM

## 2017-04-06 NOTE — Progress Notes (Signed)
Removed IV- clean, dry, intact. Reviewed d/c paperwork with pt and discussed home and new medications and where to pick up new medication. Answered all questions. Robin the NT wheeled stable pt and belongings to main entrance where he was picked up by wife.

## 2017-04-06 NOTE — Clinical Social Work Note (Signed)
Patient referred to CSW for SNF. Patient recommended for HHPT. CM is scheduling delivery of equipment. No CSW needs.  LCSW siging off.      Jaziyah Gradel, Clydene Pugh, LCSW

## 2017-04-06 NOTE — Progress Notes (Signed)
Physical Therapy Treatment Patient Details Name: Christopher [name] MRN: [id number] DOB: 01/29/1961 Today's Date: 04/06/2017  RIGHT KNEE PROM: 6-95 degrees AMBULATION DISTANCE: 150 feet using cane with Mod Indep CPM ROM: 0-95 degrees    History of Present Illness Christopher [name] is a 57 y/o male s/p EXAM UNDER ANESTHESIA WITH MANIPULATION OF KNEE (Right) with the diagnosis of arthrofibrosis right total knee replacement, history of Right TKA 02/15/17  with the diagnosis of primary osteoarthritis right knee    PT Comments    Patient instructed in stretching right quads in stairwell by holding onto side rail, propping right foot on 2nd step and leaning forward to stretch quads with good return demonstrated and understanding acknowledged.  Patient demonstrates slightly increased right knee extension and flexion with end range stretching.  Plan:  Patient to be discharged home today to follow up with outpatient physical therapy.  Plan: patient discharged to nursing for ambulation ad lib for length of stay.   Follow Up Recommendations  Outpatient PT     Equipment Recommendations  None recommended by PT    Recommendations for Other Services       Precautions / Restrictions Precautions Precautions: None Restrictions Weight Bearing Restrictions: No    Mobility  Bed Mobility Overal bed mobility: Independent                Transfers Overall transfer level: Modified independent Equipment used: Straight cane                Ambulation/Gait Ambulation/Gait assistance: Modified independent (Device/Increase time) Ambulation Distance (Feet): 150 Feet Assistive device: Straight cane Gait Pattern/deviations: Decreased step length - right;Decreased stance time - right;Decreased stride length   Gait velocity interpretation: Below normal speed for age/gender General Gait Details: slightly labored slow cadence without loss of balance with 3 point gait pattern using SPC, limited for  right heel to toe stepping RLE due to knee stiffness   Stairs            Wheelchair Mobility    Modified Rankin (Stroke Patients Only)       Balance Overall balance assessment: No apparent balance deficits (not formally assessed)                                          Cognition Arousal/Alertness: Awake/alert Behavior During Therapy: WFL for tasks assessed/performed Overall Cognitive Status: Within Functional Limits for tasks assessed                                        Exercises Other Exercises Other Exercises: quad sets with right LLE held at heels by therapist for distraction, 3 sets x 1 minute holds Other Exercises: seated right knee flexion using LLE to pull right foot towards body while sliding right hip forward to increase stretch Other Exercises: standing in stairwell holding onto siderail with RLE propped on 2nd stair while leaning into right knee to stretch quads, 3 sets of 1 minute holds    General Comments        Pertinent Vitals/Pain Pain Score: 1  Pain Location: right knee Pain Descriptors / Indicators: [place]                      Prior Function  PT Goals (current goals can now be found in the care plan section) Acute Rehab PT Goals Patient Stated Goal: increase right knee ROM to WNLs PT Goal Formulation: With patient Time For Goal Achievement: 04/27/17 Potential to Achieve Goals: Good Progress towards PT goals: Progressing toward goals    Frequency    7X/week      PT Plan Current plan remains appropriate;Discharge plan needs to be updated    Co-evaluation              AM-PAC PT "6 Clicks" Daily Activity  Outcome Measure  Difficulty turning over in bed (including adjusting bedclothes, sheets and blankets)?: None Difficulty moving from lying on back to sitting on the side of the bed? : None Difficulty sitting down on and standing up from a chair with  arms (e.g., wheelchair, bedside commode, etc,.)?: None Help needed moving to and from a bed to chair (including a wheelchair)?: None Help needed walking in hospital room?: None Help needed climbing 3-5 steps with a railing? : None 6 Click Score: 24    End of Session   Activity Tolerance: Patient tolerated treatment well Patient left: in chair;with call bell/phone within reach Nurse Communication: Mobility status PT Visit Diagnosis: Unsteadiness on feet (R26.81);Other abnormalities of gait and mobility (R26.89);Muscle weakness (generalized) (M62.81)     Time: 2876-8115 PT Time Calculation (min) (ACUTE ONLY): 26 min  Charges:  $Therapeutic Activity: 23-37 mins                    G Codes:       9:58 AM, Apr 16, 2017 Lonell Grandchild, MPT Physical Therapist with [facility] 336 [phone] office [phone] mobile phone

## 2017-04-08 ENCOUNTER — Ambulatory Visit (HOSPITAL_COMMUNITY): Payer: BLUE CROSS/BLUE SHIELD | Attending: Orthopedic Surgery | Admitting: Physical Therapy

## 2017-04-08 ENCOUNTER — Encounter (HOSPITAL_COMMUNITY): Payer: Self-pay | Admitting: Physical Therapy

## 2017-04-08 DIAGNOSIS — M25561 Pain in right knee: Secondary | ICD-10-CM

## 2017-04-08 DIAGNOSIS — R2689 Other abnormalities of gait and mobility: Secondary | ICD-10-CM | POA: Insufficient documentation

## 2017-04-08 DIAGNOSIS — M25661 Stiffness of right knee, not elsewhere classified: Secondary | ICD-10-CM | POA: Insufficient documentation

## 2017-04-08 DIAGNOSIS — R29898 Other symptoms and signs involving the musculoskeletal system: Secondary | ICD-10-CM | POA: Diagnosis present

## 2017-04-08 NOTE — Therapy (Signed)
[place], [place], [address] Phone: [phone]   Fax:  [phone]  Physical Therapy Treatment  Patient Details  Name: Christopher [name] MRN: [id number] Date of Birth: 02-Apr-1960 Referring Provider: [name], MD   Encounter Date: 04/08/2017  PT End of Session - 04/08/17 0902    Visit Number  12    Number of Visits  19    Date for PT Re-Evaluation  04/18/17    Authorization Type  [employer]    Authorization Time Period  03/07/17-04/18/17    PT Start Time  [id number]    PT Stop Time  0900    PT Time Calculation (min)  43 min    Activity Tolerance  Patient tolerated treatment well;No increased pain    Behavior During Therapy  WFL for tasks assessed/performed       Past Medical History:  Diagnosis Date  . Arthritis   . Atrial fibrillation ([place])    a. occuring in the post-operative setting in 02/2017  . HTN (hypertension)   . Hyperlipemia     Past Surgical History:  Procedure Laterality Date  . EXAM UNDER ANESTHESIA WITH MANIPULATION OF KNEE Right 04/05/2017   Procedure: EXAM UNDER ANESTHESIA WITH MANIPULATION OF KNEE;  Surgeon: Carole Civil, MD;  Location: [facility];  Service: Orthopedics;  Laterality: Right;  . HERNIA REPAIR Right   . KNEE ARTHROSCOPY  right knee  . TOTAL KNEE ARTHROPLASTY Right 02/15/2017   Procedure: TOTAL KNEE ARTHROPLASTY;  Surgeon: Carole Civil, MD;  Location: [facility];  Service: Orthopedics;  Laterality: Right;    There were no vitals filed for this visit.  Subjective Assessment - 04/08/17 0828    Subjective  Patient reported he had a manipulation done to his knee on Tuesday. Patient stated he has had right thigh tightness since the manipulation, but denies knee pain.     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  Yes    Pain Score  2     Pain Location  Other (Comment) Thigh    Pain Orientation  Right    Pain Descriptors / Indicators  Tightness    Pain Type  Surgical pain    Pain Onset  In the past 7 days    Pain Frequency  Intermittent    Multiple Pain Sites  No                      [facility] Adult PT Treatment/Exercise - 04/08/17 0001      Knee/Hip Exercises: Stretches   Passive Hamstring Stretch  Right;3 reps;30 seconds    Passive Hamstring Stretch Limitations  12" box    Knee: Self-Stretch to increase Flexion  Right;3 reps;30 seconds    Knee: Self-Stretch Limitations  12" step      Knee/Hip Exercises: Aerobic   Stationary Bike  5 minutes to increase ROM half revolutions seat 17      Knee/Hip Exercises: Supine   Quad Sets  AROM;Right;2 sets;10 reps    Quad Sets Limitations  5 second holds towel roll under ankle    Short Arc Quad Sets  AROM;15 reps;Right    Short Arc Quad Sets Limitations  5 second holds    Knee Extension  AROM    Knee Extension Limitations  8  Knee Flexion Limitations  79      Manual Therapy   Manual Therapy  Joint mobilization;Edema management;Muscle Energy Technique    Manual therapy comments  completed separate from the rest of skilled interventions    Edema Management  Retrograde massage to right lower extremity with right lower extremity elevated     Joint Mobilization  Gentle patellofemoral joint glides grade III superior and inferior for 2 minutes total. AP/PA glide to tbibiofemoral joint for flexion/extension, grade III for 3x 30-45 seconds anterior and posterior    Muscle Energy Technique  PNF stretch for right hamstrings, contract relax to facilitate muscle lengthening of right hamstrings for greater knee extension             PT Education - 04/08/17 0902    Education provided  Yes    Education Details  Patient was educated on purpose and technique of exercises throughout and focus on ROM following manipulation.     Person(s) Educated  Patient     Methods  Explanation;Verbal cues;Tactile cues    Comprehension  Returned demonstration       PT Short Term Goals - 03/28/17 0954      PT SHORT TERM GOAL #1   Title   Patient to be compliant with correct performance of HEP, to be updated weekly      Time  3    Period  Weeks    Status  Achieved      PT SHORT TERM GOAL #2   Title  Patient will improve ROM for Rt knee extension/flexion by to 5-95 degrees to improve functional mobility with gait and stair ambulation    Baseline  03/28/17 - 8-80    Time  3    Period  Weeks    Status  On-going      PT SHORT TERM GOAL #3   Title  Patient will perform 5x sit to stand in less than or equal to 12 seconds to improve functional LE strength and decrease fall risk     Baseline  03/28/17 - 9 seconds however heavy reliance on left LE    Time  3    Period  Weeks    Status  Partially Met        PT Long Term Goals - 03/28/17 4982      PT LONG TERM GOAL #1   Title  Patient will improve ROM for Rt knee extension/flexion by to 0-110 degrees to improve functional mobility with gait and stair ambulation    Baseline  03/28/17 - 8-80    Time  6    Period  Weeks    Status  New      PT LONG TERM GOAL #2   Title  Patient to show 5/5 MMT grade in all tested musculature in order to improve balance and performance during gait and stair ambulation     Time  6    Period  Weeks    Status  On-going      PT LONG TERM GOAL #3   Title  Patient will be participating in regular fitness program for strength and balance training to be independent with maintaining functional mobility and reducing pain; and to return to PLOF with coaching.     Time  6    Period  Weeks    Status  On-going      PT LONG TERM GOAL #4   Title   Patient will perform 5x sit to stand and TUG in less than  or equal to 12 seconds with LRAD to improve functional LE strength and decrease fall risk     Baseline  03/28/17 - 13 seconds With [facility]    Time  6    Period  Weeks    Status   On-going            Plan - 04/08/17 0903    Clinical Impression Statement  This session focused on improving patient's ROM following his manipulation. Patient demonstrated improvement in right knee AROM of 8-79 degrees. This session utilized manual therapy to decrease edema as well as performed joint mobilizations in order to improve patient's knee flexion and extension ROM. Patient continued to only be able to perform half revolutions on the bike this session. Therapist explained importance of focusing on improving ROM at this time.     Rehab Potential  Good    Clinical Impairments Affecting Rehab Potential  (+) motivated, (-) chronic knee pain    PT Frequency  3x / week    PT Duration  6 weeks    PT Treatment/Interventions  ADLs/Self Care Home Management;Electrical Stimulation;Cryotherapy;DME Instruction;Gait training;Stair training;Functional mobility training;Therapeutic activities;Therapeutic exercise;Balance training;Neuromuscular re-education;Patient/family education;Manual techniques;Passive range of motion;Scar mobilization;Energy conservation;Taping    PT Next Visit Plan  Focus on manual techniques to improve mobitily with focus on mobiltiy. Continue with PNF, contract relax stretch for right quad tendon and manual therapy in lengthened position for quad and hamstring tendons due to myofacial restrictions. Continue with bike for ROM.    PT Home Exercise Plan  Eval: quad set, heel slide, assisted right knee flexion stretch; 2/8: SAQ, scar mobilization; 03/16/17 - TKE with green TB, low load long duration stretch; 03/23/17: Seated hamstring stretch 3 x 30 seconds 1x/day    Consulted and Agree with Plan of Care  Patient       Patient will benefit from skilled therapeutic intervention in order to improve the following deficits and impairments:  Abnormal gait, Decreased activity tolerance, Decreased endurance, Decreased knowledge of use of DME, Decreased range of motion, Decreased skin  integrity, Decreased strength, Hypomobility, Increased fascial restricitons, Decreased balance, Decreased mobility, Decreased scar mobility, Difficulty walking, Increased edema, Pain, Impaired flexibility, Postural dysfunction  Visit Diagnosis: Acute pain of right knee  Stiffness of right knee, not elsewhere classified  Other abnormalities of gait and mobility  Other symptoms and signs involving the musculoskeletal system     Problem List Patient Active Problem List   Diagnosis Date Noted  . Arthrofibrosis of knee joint, right 04/05/2017  . S/P total knee replacement, right 02/15/17 03/01/2017  . Primary osteoarthritis of both knees 10/13/2016  . Chest pain 06/06/2016  . Hyperlipemia 04/16/2013  . HTN (hypertension), benign 04/16/2013  . Impingement syndrome of right shoulder 03/08/2011   Clarene Critchley PT, DPT 9:07 AM, 04/08/17 [place], [place], [address] Phone: [phone]   Fax:  [phone]  Name: Christopher [name] MRN: [id number] Date of Birth: 03-Sep-1960

## 2017-04-11 ENCOUNTER — Telehealth (HOSPITAL_COMMUNITY): Payer: Self-pay | Admitting: Family Medicine

## 2017-04-11 ENCOUNTER — Ambulatory Visit (HOSPITAL_COMMUNITY): Payer: BLUE CROSS/BLUE SHIELD

## 2017-04-11 NOTE — Telephone Encounter (Signed)
04/11/17  pt cx he said he did something to his back he can hardly move

## 2017-04-13 ENCOUNTER — Encounter (HOSPITAL_COMMUNITY): Payer: Self-pay

## 2017-04-13 ENCOUNTER — Ambulatory Visit (HOSPITAL_COMMUNITY): Payer: BLUE CROSS/BLUE SHIELD

## 2017-04-13 ENCOUNTER — Other Ambulatory Visit: Payer: Self-pay

## 2017-04-13 DIAGNOSIS — M25561 Pain in right knee: Secondary | ICD-10-CM

## 2017-04-13 DIAGNOSIS — R29898 Other symptoms and signs involving the musculoskeletal system: Secondary | ICD-10-CM

## 2017-04-13 DIAGNOSIS — M25661 Stiffness of right knee, not elsewhere classified: Secondary | ICD-10-CM

## 2017-04-13 DIAGNOSIS — R2689 Other abnormalities of gait and mobility: Secondary | ICD-10-CM

## 2017-04-13 NOTE — Therapy (Signed)
Christopher [name], [place], [address] Phone: [phone]   Fax:  [phone]  Physical Therapy Treatment  Patient Details  Name: Christopher [name] MRN: [id number] Date of Birth: 09-10-1960 Referring Provider: [name], MD   Encounter Date: 04/13/2017  PT End of Session - 04/13/17 0901    Visit Number  13    Number of Visits  19    Date for PT Re-Evaluation  04/18/17    Authorization Type  [employer]    Authorization Time Period  03/07/17-04/18/17    PT Start Time  0903    PT Stop Time  0944    PT Time Calculation (min)  41 min    Activity Tolerance  Patient tolerated treatment well;No increased pain    Behavior During Therapy  WFL for tasks assessed/performed       Past Medical History:  Diagnosis Date  . Arthritis   . Atrial fibrillation ([place])    a. occuring in the post-operative setting in 02/2017  . HTN (hypertension)   . Hyperlipemia     Past Surgical History:  Procedure Laterality Date  . EXAM UNDER ANESTHESIA WITH MANIPULATION OF KNEE Right 04/05/2017   Procedure: EXAM UNDER ANESTHESIA WITH MANIPULATION OF KNEE;  Surgeon: Carole Civil, MD;  Location: [facility];  Service: Orthopedics;  Laterality: Right;  . HERNIA REPAIR Right   . KNEE ARTHROSCOPY  right knee  . TOTAL KNEE ARTHROPLASTY Right 02/15/2017   Procedure: TOTAL KNEE ARTHROPLASTY;  Surgeon: Carole Civil, MD;  Location: [facility];  Service: Orthopedics;  Laterality: Right;    There were no vitals filed for this visit.  Subjective Assessment - 04/13/17 0921    Subjective  Patient reports he hdid something to his back a couple days ago and that his happend several times a year but it has already gotten better. He states he is worreied about the swelling and feels like there is pressure and tightness in his joint. He states he is still doiing his stretches and elevating and icing his leg daily.     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  No/denies       [facility] PT Assessment - 04/13/17 0001      AROM   Right Knee Extension  10    Right Knee Flexion  84       [facility] Adult PT Treatment/Exercise - 04/13/17 0001      Knee/Hip Exercises: Stretches   Passive Hamstring Stretch  Right;3 reps;30 seconds    Passive Hamstring Stretch Limitations  12" box; self mobilization with AP pressure on distal femur to increase extension mobilization for knee joint      Knee/Hip Exercises: Standing   Heel Raises  Both;20 reps;2 sets;3 seconds      Knee/Hip Exercises: Supine   Short Arc Quad Sets  AROM;Right;20 reps    Short Arc [employer] Limitations  basketball; 5 secodnd holds    Heel Slides  Right;20 reps    Heel Slides Limitations  5 second hold      Manual Therapy   Manual Therapy  Edema management;Other (comment)    Manual therapy comments  completed separate from the rest of skilled interventions    Edema Management  Retrograde massage to right lower extremity with right lower extremity  elevated     Other Manual Therapy  compression garment measure: shoe size =10, ankle = 8", calf = 13", thigh = 20.25"    Muscle Energy Technique  PNF stretch for right hamstrings, contract relax to facilitate muscle lengthening of right hamstrings for greater knee extension; 5x 5-8 seconds contract, 30 seconds relax. PNF stretch for right quad, contract relax to facilitate muscle lengthening of right quadriceps for greater knee flexion, 5x 5-8 seconds contract, 30 seconds relax        PT Education - 04/13/17 0923    Education provided  Yes    Education Details  Educated on exercise throughout. Educated on compression garment and how to order to address ongoing swelling.    Person(s) Educated  Patient    Methods  Handout;Explanation    Comprehension  Verbalized understanding        PT Short Term Goals - 03/28/17 0954      PT SHORT TERM GOAL #1   Title   Patient to be compliant with correct performance of HEP, to be updated weekly      Time  3    Period  Weeks    Status  Achieved      PT SHORT TERM GOAL #2   Title  Patient will improve ROM for Rt knee extension/flexion by to 5-95 degrees to improve functional mobility with gait and stair ambulation    Baseline  03/28/17 - 8-80    Time  3    Period  Weeks    Status  On-going      PT SHORT TERM GOAL #3   Title  Patient will perform 5x sit to stand in less than or equal to 12 seconds to improve functional LE strength and decrease fall risk     Baseline  03/28/17 - 9 seconds however heavy reliance on left LE    Time  3    Period  Weeks    Status  Partially Met        PT Long Term Goals - 03/28/17 0349      PT LONG TERM GOAL #1   Title  Patient will improve ROM for Rt knee extension/flexion by to 0-110 degrees to improve functional mobility with gait and stair ambulation    Baseline  03/28/17 - 8-80    Time  6    Period  Weeks    Status  New      PT LONG TERM GOAL #2   Title  Patient to show 5/5 MMT grade in all tested musculature in order to improve balance and performance during gait and stair ambulation     Time  6    Period  Weeks    Status  On-going      PT LONG TERM GOAL #3   Title  Patient will be participating in regular fitness program for strength and balance training to be independent with maintaining functional mobility and reducing pain; and to return to PLOF with coaching.     Time  6    Period  Weeks    Status  On-going      PT LONG TERM GOAL #4   Title   Patient will perform 5x sit to stand and TUG in less than or equal to 12 seconds with LRAD to improve functional LE strength and decrease fall risk     Baseline  03/28/17 - 13 seconds With [facility]    Time  6    Period  Weeks  Status  On-going        Plan - 04/13/17 0901    Clinical Impression Statement  Patient remains  extremely limited with ROM following his manipulation and has increased edema around his right knee joint. He was measured for a compression garment to address the edema as it is likely limiting his ROM. He was instructed o how to obtain /purchase a garment and stated he plans to order it today. Today's session focused on manual treatment for edema and PNF exercise/stretching for ROM. At end of session today his ROM remained 10-84 degrees. He will continue to benefit skilled PT services to address current impairments and progress towards goals to achieve goals, reduce pain, and improve mobility and QOL.    Rehab Potential  Good    Clinical Impairments Affecting Rehab Potential  (+) motivated, (-) chronic knee pain    PT Frequency  3x / week    PT Duration  6 weeks    PT Treatment/Interventions  ADLs/Self Care Home Management;Electrical Stimulation;Cryotherapy;DME Instruction;Gait training;Stair training;Functional mobility training;Therapeutic activities;Therapeutic exercise;Balance training;Neuromuscular re-education;Patient/family education;Manual techniques;Passive range of motion;Scar mobilization;Energy conservation;Taping    PT Next Visit Plan  Follow up with compression garment. Focus on manual techniques to improve mobility and decrease edema. Continue with PNF, contract relax stretch for right quad tendon and manual therapy in lengthened position for quad and hamstring tendons due to myofascial restrictions. Continue with bike for ROM. Initiate standing exercises: TKE, rockerboard.     PT Home Exercise Plan  Eval: quad set, heel slide, assisted right knee flexion stretch; 2/8: SAQ, scar mobilization; 03/16/17 - TKE with green TB, low load long duration stretch; 03/23/17: Seated hamstring stretch 3 x 30 seconds 1x/day    Consulted and Agree with Plan of Care  Patient       Patient will benefit from skilled therapeutic intervention in order to improve the following deficits and impairments:   Abnormal gait, Decreased activity tolerance, Decreased endurance, Decreased knowledge of use of DME, Decreased range of motion, Decreased skin integrity, Decreased strength, Hypomobility, Increased fascial restricitons, Decreased balance, Decreased mobility, Decreased scar mobility, Difficulty walking, Increased edema, Pain, Impaired flexibility, Postural dysfunction  Visit Diagnosis: Acute pain of right knee  Stiffness of right knee, not elsewhere classified  Other abnormalities of gait and mobility  Other symptoms and signs involving the musculoskeletal system     Problem List Patient Active Problem List   Diagnosis Date Noted  . Arthrofibrosis of knee joint, right 04/05/2017  . S/P total knee replacement, right 02/15/17 03/01/2017  . Primary osteoarthritis of both knees 10/13/2016  . Chest pain 06/06/2016  . Hyperlipemia 04/16/2013  . HTN (hypertension), benign 04/16/2013  . Impingement syndrome of right shoulder 03/08/2011    Kipp Brood, PT, DPT Physical Therapist with [facility]  04/13/2017 10:32 AM    [place], [place], [address] Phone: [phone]   Fax:  [phone]  Name: RAHIM ASTORGA MRN: [id number] Date of Birth: [birth year]

## 2017-04-15 ENCOUNTER — Encounter (HOSPITAL_COMMUNITY): Payer: Self-pay | Admitting: Physical Therapy

## 2017-04-15 ENCOUNTER — Ambulatory Visit (HOSPITAL_COMMUNITY): Payer: BLUE CROSS/BLUE SHIELD | Admitting: Physical Therapy

## 2017-04-15 DIAGNOSIS — R29898 Other symptoms and signs involving the musculoskeletal system: Secondary | ICD-10-CM

## 2017-04-15 DIAGNOSIS — R2689 Other abnormalities of gait and mobility: Secondary | ICD-10-CM

## 2017-04-15 DIAGNOSIS — M25561 Pain in right knee: Secondary | ICD-10-CM | POA: Diagnosis not present

## 2017-04-15 DIAGNOSIS — M25661 Stiffness of right knee, not elsewhere classified: Secondary | ICD-10-CM

## 2017-04-15 NOTE — Therapy (Addendum)
[place], [place], [address] Phone: [phone]   Fax:  [phone]  Physical Therapy Treatment / Re-assessment  Patient Details  Name: Christopher [name] MRN: [id number] Date of Birth: 02/10/1960 Referring Provider: [name]   Encounter Date: 04/15/2017  PT End of Session - 04/15/17 1104    Visit Number  14    Number of Visits  19    Date for PT Re-Evaluation  04/18/17    Authorization Type  [employer] Blue [employer]    Authorization Time Period  03/07/17-04/18/17; New: 04/18/17-05/13/17    PT Start Time  2876    PT Stop Time  0900    PT Time Calculation (min)  44 min    Activity Tolerance  Patient tolerated treatment well;No increased pain    Behavior During Therapy  WFL for tasks assessed/performed       Past Medical History:  Diagnosis Date  . Arthritis   . Atrial fibrillation ([place])    a. occuring in the post-operative setting in 02/2017  . HTN (hypertension)   . Hyperlipemia     Past Surgical History:  Procedure Laterality Date  . EXAM UNDER ANESTHESIA WITH MANIPULATION OF KNEE Right 04/05/2017   Procedure: EXAM UNDER ANESTHESIA WITH MANIPULATION OF KNEE;  Surgeon: Carole Civil, MD;  Location: [facility];  Service: Orthopedics;  Laterality: Right;  . HERNIA REPAIR Right   . KNEE ARTHROSCOPY  right knee  . TOTAL KNEE ARTHROPLASTY Right 02/15/2017   Procedure: TOTAL KNEE ARTHROPLASTY;  Surgeon: Carole Civil, MD;  Location: [facility];  Service: Orthopedics;  Laterality: Right;    There were no vitals filed for this visit.  Subjective Assessment - 04/15/17 0824    Subjective  Patient reported he has been doing his home exercises. He stated that his knee does not hurt but his thigh is sore from the swelling. Patient reported he ordered the compression hose and that it should arrive Monday or Tuesday of next week.     Limitations  Lifting;Standing;Walking;House hold activities    How long can you  sit comfortably?  unlimited    How long can you stand comfortably?  35-40 minutes    How long can you walk comfortably?  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  Yes    Pain Score  2     Pain Location  Other (Comment) Right thigh    Pain Orientation  Right    Pain Descriptors / Indicators  Sore    Pain Type  Surgical pain    Pain Onset  1 to 4 weeks ago    Aggravating Factors   Night time gets worse    Pain Relieving Factors  Stretching makes it feel better    Multiple Pain Sites  No         [facility] PT Assessment - 04/15/17 0001      Assessment   Medical Diagnosis  Right TKA    Referring Provider  [name]      Prior Function   Level of Independence  Independent;Independent with basic ADLs      Observation/Other Assessments   Focus on Therapeutic Outcomes (FOTO)   51% (49% limited)      Circumferential Edema   Circumferential - Right  39.5    Circumferential - Left   36  cm      AROM   Right Knee Extension  10    Right Knee Flexion  74      Strength   Right Hip Flexion  5/5    Right Hip Extension  4+/5    Right Hip ABduction  5/5    Left Hip Flexion  5/5    Left Hip Extension  5/5    Left Hip ABduction  5/5    Right Knee Flexion  4+/5    Right Knee Extension  4+/5    Left Knee Flexion  5/5    Left Knee Extension  5/5    Right Ankle Dorsiflexion  5/5    Left Ankle Dorsiflexion  5/5      Palpation   Patella mobility  hypomobile    Palpation comment  Patient reported tenderness through quadriceps      Ambulation/Gait   Ambulation Distance (Feet)  526 Feet 3MWT      Standardized Balance Assessment   Five times sit to stand comments   15.36 seconds; no use of upper extremity, patient continues to use left leg predominately      Timed Up and Go Test   Normal TUG (seconds)  9.26 Without cane                  [facility] Adult PT Treatment/Exercise -  04/15/17 0001      Ambulation/Gait   Ambulation/Gait  Yes    Ambulation/Gait Assistance  6: Modified independent (Device/Increase time)    Assistive device  Straight cane    Gait Pattern  Decreased hip/knee flexion - right;Decreased stance time - right;Decreased step length - left;Decreased stride length;Decreased weight shift to right;Antalgic;Step-through pattern    Ambulation Surface  Level    Gait velocity  0.89 m/s    Stairs  Yes    Stairs Assistance  6: Modified independent (Device/Increase time)    Stair Management Technique  With cane;No rails;Step to pattern;Other (comment) Up with the left down with the right      Manual Therapy   Manual Therapy  Edema management;Other (comment)    Manual therapy comments  completed separate from the rest of skilled interventions    Edema Management  Retrograde massage to right lower extremity with right lower extremity elevated     Muscle Energy Technique  PNF stretch for right hamstrings, contract relax to facilitate muscle lengthening of right hamstrings for greater knee extension; 5x 5-8 seconds contract, 30 seconds relax. PNF stretch for right quad, contract relax to facilitate muscle lengthening of right quadriceps for greater knee flexion, 5x 5-8 seconds contract, 30 seconds relax             PT Education - 04/15/17 1103    Education provided  Yes    Education Details  Patient was educated on results of re-assessment and need for continued therapy. Patient was educated on purpose of manual therapy.     Person(s) Educated  Patient    Methods  Explanation    Comprehension  Verbalized understanding       PT Short Term Goals - 04/15/17 0935      PT SHORT TERM GOAL #1   Title   Patient to be compliant with correct performance of HEP, to be updated weekly      Time  3    Period  Weeks    Status  Achieved      PT SHORT TERM GOAL #2   Title  Patient will improve  ROM for Rt knee extension/flexion by to 5-95 degrees to improve  functional mobility with gait and stair ambulation    Baseline  04/15/17: Right knee extension/flexion AROM 10-74 degrees    Time  3    Period  Weeks    Status  On-going      PT SHORT TERM GOAL #3   Title  Patient will perform 5x sit to stand in less than or equal to 12 seconds to improve functional LE strength and decrease fall risk     Baseline  04/15/17: Patient performed 5x sit to stand in 15. 36 seconds    Time  3    Period  Weeks    Status  On-going        PT Long Term Goals - 04/15/17 2229      PT LONG TERM GOAL #1   Title  Patient will improve ROM for Rt knee extension/flexion by to 0-110 degrees to improve functional mobility with gait and stair ambulation    Baseline  04/15/17: Patient's Rt knee extension/flexion AROM was 10-74 degrees.    Time  6    Period  Weeks    Status  On-going      PT LONG TERM GOAL #2   Title  Patient to show 5/5 MMT grade in all tested musculature in order to improve balance and performance during gait and stair ambulation     Baseline  04/15/17: Patient is still limited in right hip extension, right knee flexion, and right knee extension    Time  6    Period  Weeks    Status  On-going      PT LONG TERM GOAL #3   Title  Patient will be participating in regular fitness program for strength and balance training to be independent with maintaining functional mobility and reducing pain; and to return to PLOF with coaching.     Baseline  04/15/17: Patient reported he has not begun this yet.     Time  6    Period  Weeks    Status  On-going      PT LONG TERM GOAL #4   Title   Patient will perform 5x sit to stand and TUG in less than or equal to 12 seconds with LRAD to improve functional LE strength and decrease fall risk     Baseline  04/15/17: Patient performed TUG in less than 12 seconds without cane, but performed 5x sit to stand in greater than 12 seconds.     Time  6    Period  Weeks    Status  Partially Met            Plan - 04/15/17  1105    Clinical Impression Statement  This session performed a re-assessment of patient's progress toward goals. Patient has achieved 1 out of three of his short term goals. Patient has partially met 1 out of 4 of his long term goals. Patient continued to demonstrate swelling and pain this session in his right lower extremity. Patient continued to demonstrate decreased range of motion in his right knee with 76 degrees of right knee flexion and lacking 10 degrees of right knee extension. With ambulation patient continued to demonstrate an antalgic gait and a step-to gait pattern with stair ambulation. In addition, patient is still limited in his knee range of motion and strength. The remainder of the session therapist performed manual therapy to decrease swelling in patient's right lower extremity and to improve mobility of  his right knee. Patient would benefit from continued skilled physical therapy in order to continue addressing the abovementioned deficits.     Rehab Potential  Good    Clinical Impairments Affecting Rehab Potential  (+) motivated, (-) chronic knee pain    PT Frequency  3x / week    PT Duration  4 weeks    PT Treatment/Interventions  ADLs/Self Care Home Management;Electrical Stimulation;Cryotherapy;DME Instruction;Gait training;Stair training;Functional mobility training;Therapeutic activities;Therapeutic exercise;Balance training;Neuromuscular re-education;Patient/family education;Manual techniques;Passive range of motion;Scar mobilization;Energy conservation;Taping    PT Next Visit Plan  Focus on manual techniques to improve mobility and decrease edema. Continue with PNF, contract relax stretch for right quad tendon and manual therapy in lengthened position for quad and hamstring tendons due to myofascial restrictions. Continue with bike for ROM. Initiate standing exercises: TKE, rockerboard.     PT Home Exercise Plan  Eval: quad set, heel slide, assisted right knee flexion stretch; 2/8:  SAQ, scar mobilization; 03/16/17 - TKE with green TB, low load long duration stretch; 03/23/17: Seated hamstring stretch 3 x 30 seconds 1x/day    Consulted and Agree with Plan of Care  Patient       Patient will benefit from skilled therapeutic intervention in order to improve the following deficits and impairments:  Abnormal gait, Decreased activity tolerance, Decreased endurance, Decreased knowledge of use of DME, Decreased range of motion, Decreased skin integrity, Decreased strength, Hypomobility, Increased fascial restricitons, Decreased balance, Decreased mobility, Decreased scar mobility, Difficulty walking, Increased edema, Pain, Impaired flexibility, Postural dysfunction  Visit Diagnosis: Acute pain of right knee - Plan: PT plan of care cert/re-cert  Stiffness of right knee, not elsewhere classified - Plan: PT plan of care cert/re-cert  Other abnormalities of gait and mobility - Plan: PT plan of care cert/re-cert  Other symptoms and signs involving the musculoskeletal system - Plan: PT plan of care cert/re-cert     Problem List Patient Active Problem List   Diagnosis Date Noted  . Arthrofibrosis of knee joint, right 04/05/2017  . S/P total knee replacement, right 02/15/17 03/01/2017  . Primary osteoarthritis of both knees 10/13/2016  . Chest pain 06/06/2016  . Hyperlipemia 04/16/2013  . HTN (hypertension), benign 04/16/2013  . Impingement syndrome of right shoulder 03/08/2011   Clarene Critchley PT, DPT 11:12 AM, 04/15/17 [place], [place], [address] Phone: [phone]   Fax:  [phone]  Name: ELIA KEENUM MRN: [id number] Date of Birth: 09-Jan-1961

## 2017-04-18 ENCOUNTER — Ambulatory Visit (HOSPITAL_COMMUNITY): Payer: BLUE CROSS/BLUE SHIELD

## 2017-04-18 ENCOUNTER — Other Ambulatory Visit: Payer: Self-pay

## 2017-04-18 ENCOUNTER — Encounter (HOSPITAL_COMMUNITY): Payer: Self-pay

## 2017-04-18 DIAGNOSIS — R29898 Other symptoms and signs involving the musculoskeletal system: Secondary | ICD-10-CM

## 2017-04-18 DIAGNOSIS — R2689 Other abnormalities of gait and mobility: Secondary | ICD-10-CM

## 2017-04-18 DIAGNOSIS — M25661 Stiffness of right knee, not elsewhere classified: Secondary | ICD-10-CM

## 2017-04-18 DIAGNOSIS — M25561 Pain in right knee: Secondary | ICD-10-CM

## 2017-04-18 NOTE — Patient Instructions (Addendum)
   HAMSTRING STRETCH WITH MULTI-LOOP STRAP: 3-5 times   Lie on your back and place a stretching strap on your foot. Pull on the strap to assist in raising your leg up for a stretch to the back of your leg. Contract your hamstring trying to pull the rope down (don't allow that to happen by pulling back with your arms). Hold the contraction for 5-10 seconds. Then relax and pull further back for a greater stretch, hold for 30 seconds. Repeat.   Keep your target leg straight to slightly bent the entire time.       Prone Quad Stretch: 3-5 times   Lie down flat on your stomach. Wrap a strap (belt, towel, dog leash) around the top of one of your feet and pull the strap across your opposite shoulder so that your knee starts to curl up to your body. Pull until a stretch is felt across the front of your thigh.   Contract your quadracip trying to pull the rope down (don't allow that to happen by pulling back with your arms). Hold the contraction for 5-10 seconds. Then relax and pull further back for a greater stretch, hold for 30 seconds. Repeat.

## 2017-04-18 NOTE — Therapy (Signed)
[place], [place], [address] Phone: [phone]   Fax:  [phone]  Physical Therapy Treatment  Patient Details  Name: Christopher [name] MRN: [id number] Date of Birth: Apr 10, 1960 Referring Provider: [name]   Encounter Date: 04/18/2017  PT End of Session - 04/18/17 1120    Visit Number  15    Number of Visits  19    Date for PT Re-Evaluation  05/13/17    Authorization Type  [employer]    Authorization Time Period  03/07/17-04/18/17; New: 04/18/17-05/13/17    PT Start Time  1121 patient late    PT Stop Time  1205    PT Time Calculation (min)  44 min    Activity Tolerance  Patient tolerated treatment well;No increased pain    Behavior During Therapy  WFL for tasks assessed/performed       Past Medical History:  Diagnosis Date  . Arthritis   . Atrial fibrillation ([place])    a. occuring in the post-operative setting in 02/2017  . HTN (hypertension)   . Hyperlipemia     Past Surgical History:  Procedure Laterality Date  . EXAM UNDER ANESTHESIA WITH MANIPULATION OF KNEE Right 04/05/2017   Procedure: EXAM UNDER ANESTHESIA WITH MANIPULATION OF KNEE;  Surgeon: Carole Civil, MD;  Location: [facility];  Service: Orthopedics;  Laterality: Right;  . HERNIA REPAIR Right   . KNEE ARTHROSCOPY  right knee  . TOTAL KNEE ARTHROPLASTY Right 02/15/2017   Procedure: TOTAL KNEE ARTHROPLASTY;  Surgeon: Carole Civil, MD;  Location: [facility];  Service: Orthopedics;  Laterality: Right;    There were no vitals filed for this visit.  Subjective Assessment - 04/18/17 1125    Subjective  Patient arrives with compression garment on right LE, he reports he used the CPM this morning and got all the way to 100*. He reports continued compliance with HEP.    Limitations  Lifting;Standing;Walking;House hold activities    How long can you sit comfortably?  unlimited    How long can you stand comfortably?  35-40 minutes     How long can you walk comfortably?  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  Yes    Pain Score  1     Pain Location  Knee    Pain Orientation  Right    Pain Descriptors / Indicators  Aching    Pain Type  Surgical pain;Chronic pain    Pain Onset  More than a month ago    Pain Frequency  Intermittent        [facility] PT Assessment - 04/18/17 0001      AROM   Right Knee Extension  7    Right Knee Flexion  72       [facility] Adult PT Treatment/Exercise - 04/18/17 0001      Knee/Hip Exercises: Stretches   Passive Hamstring Stretch  Right;3 reps;30 seconds    Passive Hamstring Stretch Limitations  12" box; self mobilization with AP pressure on distal femur to increase extension mobilization for knee joint    Knee: Self-Stretch to increase Flexion  Right;3 reps;30 seconds    Knee: Self-Stretch Limitations  12" step    Gastroc Stretch  Both;3 reps;30 seconds    Gastroc Stretch Limitations  slant board      Knee/Hip Exercises:  Standing   Terminal Knee Extension  Right;Theraband    Theraband Level (Terminal Knee Extension)  Level 4 (Blue)    Terminal Knee Extension Limitations  20x 10 seconds      Knee/Hip Exercises: Supine   Quad Sets  1 set;10 reps;Right    Quad Sets Limitations  5 second holds    Short Arc Quad Sets  AROM;Right;20 reps    Short Arc Quad Sets Limitations  3 seconds    Heel Slides  Right;1 set;20 reps    Heel Slides Limitations  5 second hold    Other Supine Knee/Hip Exercises  Contract relax hamstring stretch; 3x 5-8 second contract, 30 second relax/stretch      Knee/Hip Exercises: Prone   Contract/Relax to Increase Flexion  3x 5-8 second contract, 30 second relax/stretch      Manual Therapy   Manual Therapy  Joint mobilization    Manual therapy comments  completed separate from the rest of skilled interventions    Joint Mobilization  AP/PA glide to tbibiofemoral  joint for flexion/extension, grade III for 3x 30-45 seconds anterior and posterior    Muscle Energy Technique  --        PT Education - 04/18/17 1206    Education provided  Yes    Education Details  Educated on exercises throughout and on updated HEP. Educated on proper care of compression garments.    Person(s) Educated  Patient    Methods  Explanation;Handout    Comprehension  Verbalized understanding;Returned demonstration       PT Short Term Goals - 04/15/17 0935      PT SHORT TERM GOAL #1   Title   Patient to be compliant with correct performance of HEP, to be updated weekly      Time  3    Period  Weeks    Status  Achieved      PT SHORT TERM GOAL #2   Title  Patient will improve ROM for Rt knee extension/flexion by to 5-95 degrees to improve functional mobility with gait and stair ambulation    Baseline  04/15/17: Right knee extension/flexion AROM 10-74 degrees    Time  3    Period  Weeks    Status  On-going      PT SHORT TERM GOAL #3   Title  Patient will perform 5x sit to stand in less than or equal to 12 seconds to improve functional LE strength and decrease fall risk     Baseline  04/15/17: Patient performed 5x sit to stand in 15. 36 seconds    Time  3    Period  Weeks    Status  On-going        PT Long Term Goals - 04/15/17 8270      PT LONG TERM GOAL #1   Title  Patient will improve ROM for Rt knee extension/flexion by to 0-110 degrees to improve functional mobility with gait and stair ambulation    Baseline  04/15/17: Patient's Rt knee extension/flexion AROM was 10-74 degrees.    Time  6    Period  Weeks    Status  On-going      PT LONG TERM GOAL #2   Title  Patient to show 5/5 MMT grade in all tested musculature in order to improve balance and performance during gait and stair ambulation     Baseline  04/15/17: Patient is still limited in right hip extension, right knee flexion, and right knee extension  Time  6    Period  Weeks    Status  On-going       PT LONG TERM GOAL #3   Title  Patient will be participating in regular fitness program for strength and balance training to be independent with maintaining functional mobility and reducing pain; and to return to PLOF with coaching.     Baseline  04/15/17: Patient reported he has not begun this yet.     Time  6    Period  Weeks    Status  On-going      PT LONG TERM GOAL #4   Title   Patient will perform 5x sit to stand and TUG in less than or equal to 12 seconds with LRAD to improve functional LE strength and decrease fall risk     Baseline  04/15/17: Patient performed TUG in less than 12 seconds without cane, but performed 5x sit to stand in greater than 12 seconds.     Time  6    Period  Weeks    Status  Partially Met        Plan - 04/18/17 1202    Clinical Impression Statement  Patient remains limited with ROM and edema around his right knee joint. He has purchased compression garments for his right LE and his knee has decreased edema by 1 cm and the fluid is more supple/less indurated than last session. He was educated on proper care for the compression garment. Today's session focused on manual treatment for joint mobility and ROM exercise/stretching for ROM. HEP was updated with PNF contract-relax stretches to improve right knee flexion and extension. He will continue to benefit from skilled PT services to address current impairments and progress towards goals to achieve goals, reduce pain, and improve mobility and QOL.    Rehab Potential  Good    Clinical Impairments Affecting Rehab Potential  (+) motivated, (-) chronic knee pain    PT Frequency  3x / week    PT Duration  4 weeks    PT Treatment/Interventions  ADLs/Self Care Home Management;Electrical Stimulation;Cryotherapy;DME Instruction;Gait training;Stair training;Functional mobility training;Therapeutic activities;Therapeutic exercise;Balance training;Neuromuscular re-education;Patient/family education;Manual techniques;Passive  range of motion;Scar mobilization;Energy conservation;Taping    PT Next Visit Plan  Focus on manual techniques to improve mobility and decrease edema. Continue with PNF, contract relax stretch for right quad tendon and manual therapy in lengthened position for quad and hamstring tendons due to myofascial restrictions. Continue with bike for ROM. Initiate standing exercises: TKE, rockerboard. Introduce movement with mobilization for knee flexion stretch    PT Home Exercise Plan  Eval: quad set, heel slide, assisted right knee flexion stretch; 2/8: SAQ, scar mobilization; 03/16/17 - TKE with green TB, low load long duration stretch; 03/23/17: Seated hamstring stretch 3 x 30 seconds 1x/day; 04/18/17 - contract relax stretch for quad/hamstring    Consulted and Agree with Plan of Care  Patient       Patient will benefit from skilled therapeutic intervention in order to improve the following deficits and impairments:  Abnormal gait, Decreased activity tolerance, Decreased endurance, Decreased knowledge of use of DME, Decreased range of motion, Decreased skin integrity, Decreased strength, Hypomobility, Increased fascial restricitons, Decreased balance, Decreased mobility, Decreased scar mobility, Difficulty walking, Increased edema, Pain, Impaired flexibility, Postural dysfunction  Visit Diagnosis: Acute pain of right knee  Stiffness of right knee, not elsewhere classified  Other abnormalities of gait and mobility  Other symptoms and signs involving the musculoskeletal system     Problem List  Patient Active Problem List   Diagnosis Date Noted  . Arthrofibrosis of knee joint, right 04/05/2017  . S/P total knee replacement, right 02/15/17 03/01/2017  . Primary osteoarthritis of both knees 10/13/2016  . Chest pain 06/06/2016  . Hyperlipemia 04/16/2013  . HTN (hypertension), benign 04/16/2013  . Impingement syndrome of right shoulder 03/08/2011    Kipp Brood, PT, DPT Physical Therapist  with [facility]  04/18/2017 12:14 PM    [facility] [address] [place], [place], [address] Phone: [phone]   Fax:  [phone]  Name: Christopher [name] MRN: [id number] Date of Birth: 06-09-1960

## 2017-04-19 ENCOUNTER — Encounter: Payer: Self-pay | Admitting: Orthopedic Surgery

## 2017-04-19 ENCOUNTER — Ambulatory Visit (INDEPENDENT_AMBULATORY_CARE_PROVIDER_SITE_OTHER): Payer: BLUE CROSS/BLUE SHIELD | Admitting: Orthopedic Surgery

## 2017-04-19 VITALS — BP 200/130 | HR 73 | Ht 71.0 in | Wt 181.0 lb

## 2017-04-19 DIAGNOSIS — M24661 Ankylosis, right knee: Secondary | ICD-10-CM

## 2017-04-19 DIAGNOSIS — Z96651 Presence of right artificial knee joint: Secondary | ICD-10-CM

## 2017-04-19 MED ORDER — HYDROCODONE-ACETAMINOPHEN 10-325 MG PO TABS: 1.0000 | ORAL_TABLET | ORAL | 0 refills | Status: DC | PRN

## 2017-04-19 NOTE — Progress Notes (Signed)
POST OP VISIT   Patient ID: Christopher [name], male   DOB: Feb 27, 1960, 57 y.o.   MRN: [id number]  Chief Complaint  Patient presents with  . Post-op Follow-up    Manipulation 04/05/17 TKR 02/15/17    Encounter Diagnoses  Name Primary?  [name] Arthrofibrosis of knee joint, right s/p Manipulation 04/05/17 Yes  . S/P total knee replacement, right 02/15/17    This is postop day 14 after manipulation  BP (!) 200/130   Pulse 73   Ht 5\' 11"  (1.803 m)   Wt 181 lb (82.1 kg)   BMI 25.24 kg/m   His extension has not come back as much as we would like but he is flexing close to 90 degrees now is using a cane he is transition to hydrocodone for pain he will see him in about 2 months  7-72 at PT

## 2017-04-20 ENCOUNTER — Other Ambulatory Visit: Payer: Self-pay

## 2017-04-20 ENCOUNTER — Ambulatory Visit (HOSPITAL_COMMUNITY): Payer: BLUE CROSS/BLUE SHIELD

## 2017-04-20 ENCOUNTER — Encounter (HOSPITAL_COMMUNITY): Payer: Self-pay

## 2017-04-20 DIAGNOSIS — M25661 Stiffness of right knee, not elsewhere classified: Secondary | ICD-10-CM

## 2017-04-20 DIAGNOSIS — R29898 Other symptoms and signs involving the musculoskeletal system: Secondary | ICD-10-CM

## 2017-04-20 DIAGNOSIS — R2689 Other abnormalities of gait and mobility: Secondary | ICD-10-CM

## 2017-04-20 DIAGNOSIS — M25561 Pain in right knee: Secondary | ICD-10-CM

## 2017-04-20 NOTE — Therapy (Signed)
[place], [place], [address] Phone: [phone]   Fax:  [phone]  Physical Therapy Treatment  Patient Details  Name: Christopher [name] MRN: [id number] Date of Birth: [birth year] Referring Provider: [name]   Encounter Date: 04/20/2017  PT End of Session - 04/20/17 0842    Visit Number  16    Number of Visits  19    Date for PT Re-Evaluation  05/13/17    Authorization Type  [employer]    Authorization Time Period  03/07/17-04/18/17; New: 04/18/17-05/13/17    PT Start Time  0818    PT Stop Time  0859    PT Time Calculation (min)  41 min    Activity Tolerance  Patient tolerated treatment well;No increased pain    Behavior During Therapy  WFL for tasks assessed/performed       Past Medical History:  Diagnosis Date  . Arthritis   . Atrial fibrillation ([place])    a. occuring in the post-operative setting in 02/2017  . HTN (hypertension)   . Hyperlipemia     Past Surgical History:  Procedure Laterality Date  . EXAM UNDER ANESTHESIA WITH MANIPULATION OF KNEE Right 04/05/2017   Procedure: EXAM UNDER ANESTHESIA WITH MANIPULATION OF KNEE;  Surgeon: Carole Civil, MD;  Location: [facility];  Service: Orthopedics;  Laterality: Right;  . HERNIA REPAIR Right   . KNEE ARTHROSCOPY  right knee  . TOTAL KNEE ARTHROPLASTY Right 02/15/2017   Procedure: TOTAL KNEE ARTHROPLASTY;  Surgeon: Carole Civil, MD;  Location: [facility];  Service: Orthopedics;  Laterality: Right;    There were no vitals filed for this visit.  Subjective Assessment - 04/20/17 0821    Subjective  Patient reports he went for a follow up appointment with Dr. Aline Brochure yesterday and he is overall happy with his improved flexion but concerned abuot his lackign knee extension. He reports he usees the CPM machine almost every morning or at lunch time. [name] also keeping up with his exercises.     Limitations  Lifting;Standing;Walking;House  hold activities    How long can you sit comfortably?  unlimited    How long can you stand comfortably?  35-40 minutes    How long can you walk comfortably?  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  No/denies        [facility] Adult PT Treatment/Exercise - 04/20/17 0001      Knee/Hip Exercises: Stretches   Passive Hamstring Stretch  Right;3 reps;30 seconds    Passive Hamstring Stretch Limitations  8" box; self mobilization with AP pressure on distal femur to increase extension mobilization for knee joint    Knee: Self-Stretch to increase Flexion  Right;3 reps;30 seconds    Knee: Self-Stretch Limitations  12" step; AP mobilization on tibia for flexion      Knee/Hip Exercises: Aerobic   Nustep  5 minutes on level 1; seat 11, BLE (no UE)      Knee/Hip Exercises: Standing   Terminal Knee Extension  Right;Theraband    Theraband Level (Terminal Knee Extension)  Level 4 (Blue)    Terminal Knee Extension Limitations  20x 10 seconds    Rocker Board  2 minutes;Limitations    Rocker Board Limitations  2x 1 minute    SLS  --  Knee/Hip Exercises: Supine   Knee Extension  AROM    Other Supine Knee/Hip Exercises  Contract relax hamstring stretch; 3x 5-8 second contract, 30 second relax/stretch      Knee/Hip Exercises: Prone   Contract/Relax to Increase Flexion  3x 5-8 second contract, 30 second relax/stretch      Manual Therapy   Manual Therapy  --    Joint Mobilization  --        PT Education - 04/20/17 0842    Education provided  Yes    Education Details  Educated on exercsies throughuot session     Person(s) Educated  Patient    Methods  Explanation    Comprehension  Verbalized understanding       PT Short Term Goals - 04/15/17 0935      PT SHORT TERM GOAL #1   Title   Patient to be compliant with correct performance of HEP, to be updated weekly      Time  3    Period  Weeks     Status  Achieved      PT SHORT TERM GOAL #2   Title  Patient will improve ROM for Rt knee extension/flexion by to 5-95 degrees to improve functional mobility with gait and stair ambulation    Baseline  04/15/17: Right knee extension/flexion AROM 10-74 degrees    Time  3    Period  Weeks    Status  On-going      PT SHORT TERM GOAL #3   Title  Patient will perform 5x sit to stand in less than or equal to 12 seconds to improve functional LE strength and decrease fall risk     Baseline  04/15/17: Patient performed 5x sit to stand in 15. 36 seconds    Time  3    Period  Weeks    Status  On-going        PT Long Term Goals - 04/15/17 6950      PT LONG TERM GOAL #1   Title  Patient will improve ROM for Rt knee extension/flexion by to 0-110 degrees to improve functional mobility with gait and stair ambulation    Baseline  04/15/17: Patient's Rt knee extension/flexion AROM was 10-74 degrees.    Time  6    Period  Weeks    Status  On-going      PT LONG TERM GOAL #2   Title  Patient to show 5/5 MMT grade in all tested musculature in order to improve balance and performance during gait and stair ambulation     Baseline  04/15/17: Patient is still limited in right hip extension, right knee flexion, and right knee extension    Time  6    Period  Weeks    Status  On-going      PT LONG TERM GOAL #3   Title  Patient will be participating in regular fitness program for strength and balance training to be independent with maintaining functional mobility and reducing pain; and to return to PLOF with coaching.     Baseline  04/15/17: Patient reported he has not begun this yet.     Time  6    Period  Weeks    Status  On-going      PT LONG TERM GOAL #4   Title   Patient will perform 5x sit to stand and TUG in less than or equal to 12 seconds with LRAD to improve functional LE strength and decrease fall risk  Baseline  04/15/17: Patient performed TUG in less than 12 seconds without cane, but  performed 5x sit to stand in greater than 12 seconds.     Time  6    Period  Weeks    Status  Partially Met        Plan - 04/20/17 0843    Clinical Impression Statement  Patient had a follow up with his MD yesterday who was able to obtain 90 degrees flexion but patient remains greatly limited with knee extension ROM. He has demonstrated good compliance with compression garment use and his ROM was 8-81 degrees today compared to last session (7-72). He demonstrated good carryover in performance of contract relax exercise today. He will continue to benefit from skilled PT services to address current impairments and progress towards goals to achieve goals, reduce pain, and improve mobility and QOL.    Rehab Potential  Good    Clinical Impairments Affecting Rehab Potential  (+) motivated, (-) chronic knee pain    PT Frequency  3x / week    PT Duration  4 weeks    PT Treatment/Interventions  ADLs/Self Care Home Management;Electrical Stimulation;Cryotherapy;DME Instruction;Gait training;Stair training;Functional mobility training;Therapeutic activities;Therapeutic exercise;Balance training;Neuromuscular re-education;Patient/family education;Manual techniques;Passive range of motion;Scar mobilization;Energy conservation;Taping    PT Next Visit Plan  Continue with manual techniques to improve mobility of tibfem/patellofem joint and manual therapy in lengthened position for quad and hamstring tendons due to myofascial restrictions. Continue with PNF, contract relax stretch for right quad tendon. Transition from Nustep to bike for ROM. Continue: TKE, rockerboard. Introduce movement with mobilization for knee flexion stretch    PT Home Exercise Plan  Eval: quad set, heel slide, assisted right knee flexion stretch; 2/8: SAQ, scar mobilization; 03/16/17 - TKE with green TB, low load long duration stretch; 03/23/17: Seated hamstring stretch 3 x 30 seconds 1x/day; 04/18/17 - contract relax stretch for quad/hamstring     Consulted and Agree with Plan of Care  Patient       Patient will benefit from skilled therapeutic intervention in order to improve the following deficits and impairments:  Abnormal gait, Decreased activity tolerance, Decreased endurance, Decreased knowledge of use of DME, Decreased range of motion, Decreased skin integrity, Decreased strength, Hypomobility, Increased fascial restricitons, Decreased balance, Decreased mobility, Decreased scar mobility, Difficulty walking, Increased edema, Pain, Impaired flexibility, Postural dysfunction  Visit Diagnosis: Acute pain of right knee  Stiffness of right knee, not elsewhere classified  Other abnormalities of gait and mobility  Other symptoms and signs involving the musculoskeletal system     Problem List Patient Active Problem List   Diagnosis Date Noted  . Arthrofibrosis of knee joint, right s/p Manipulation 04/05/17 04/05/2017  . S/P total knee replacement, right 02/15/17 03/01/2017  . Primary osteoarthritis of both knees 10/13/2016  . Chest pain 06/06/2016  . Hyperlipemia 04/16/2013  . HTN (hypertension), benign 04/16/2013  . Impingement syndrome of right shoulder 03/08/2011    Kipp Brood, PT, DPT Physical Therapist with [facility]  04/20/2017 12:28 PM    [facility] [address] [place], [place], [address] Phone: [phone]   Fax:  [phone]  Name: Christopher [name] MRN: [id number] Date of Birth: 10-11-1960

## 2017-04-22 ENCOUNTER — Ambulatory Visit (HOSPITAL_COMMUNITY): Payer: BLUE CROSS/BLUE SHIELD

## 2017-04-22 ENCOUNTER — Encounter (HOSPITAL_COMMUNITY): Payer: Self-pay

## 2017-04-22 DIAGNOSIS — R29898 Other symptoms and signs involving the musculoskeletal system: Secondary | ICD-10-CM

## 2017-04-22 DIAGNOSIS — M25661 Stiffness of right knee, not elsewhere classified: Secondary | ICD-10-CM

## 2017-04-22 DIAGNOSIS — M25561 Pain in right knee: Secondary | ICD-10-CM

## 2017-04-22 DIAGNOSIS — R2689 Other abnormalities of gait and mobility: Secondary | ICD-10-CM

## 2017-04-22 NOTE — Therapy (Signed)
[place], [place], [address] Phone: [phone]   Fax:  [phone]  Physical Therapy Treatment  Patient Details  Name: Christopher [name] MRN: [id number] Date of Birth: Jun 16, 1960 Referring Provider: [name]   Encounter Date: 04/22/2017  PT End of Session - 04/22/17 1258    Visit Number  17    Number of Visits  19    Date for PT Re-Evaluation  05/13/17    Authorization Type  [employer]    Authorization Time Period  03/07/17-04/18/17; New: 04/18/17-05/13/17    PT Start Time  1300    PT Stop Time  1343    PT Time Calculation (min)  43 min    Activity Tolerance  Patient tolerated treatment well;No increased pain    Behavior During Therapy  WFL for tasks assessed/performed       Past Medical History:  Diagnosis Date  . Arthritis   . Atrial fibrillation ([place])    a. occuring in the post-operative setting in 02/2017  . HTN (hypertension)   . Hyperlipemia     Past Surgical History:  Procedure Laterality Date  . EXAM UNDER ANESTHESIA WITH MANIPULATION OF KNEE Right 04/05/2017   Procedure: EXAM UNDER ANESTHESIA WITH MANIPULATION OF KNEE;  Surgeon: Carole Civil, MD;  Location: [facility];  Service: Orthopedics;  Laterality: Right;  . HERNIA REPAIR Right   . KNEE ARTHROSCOPY  right knee  . TOTAL KNEE ARTHROPLASTY Right 02/15/2017   Procedure: TOTAL KNEE ARTHROPLASTY;  Surgeon: Carole Civil, MD;  Location: [facility];  Service: Orthopedics;  Laterality: Right;    There were no vitals filed for this visit.  Subjective Assessment - 04/22/17 1259    Subjective  Pt reports that his knee is feeling a whole lot better today. He reports he has good days and bad days. Currently 1/10 achey feeling.    Limitations  Lifting;Standing;Walking;House hold activities    How long can you sit comfortably?  unlimited    How long can you stand comfortably?  35-40 minutes    How long can you walk comfortably?  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  Yes    Pain Score  1     Pain Location  Knee    Pain Orientation  Right    Pain Descriptors / Indicators  Aching;Dull    Pain Type  Surgical pain;Chronic pain    Pain Onset  More than a month ago    Pain Frequency  Intermittent    Aggravating Factors   night time gets worse    Pain Relieving Factors  stretching makes it feel better    Effect of Pain on Daily Activities  difficulty walking but improved since before surgery     Multiple Pain Sites  No           [facility] Adult PT Treatment/Exercise - 04/22/17 0001      Knee/Hip Exercises: Stretches   Passive Hamstring Stretch  Right;3 reps;30 seconds    Passive Hamstring Stretch Limitations  12" box; self mobilization with AP pressure on distal femur to increase extension mobilization for knee joint    Quad Stretch  Right;3 reps;30 seconds;Limitations    Quad Stretch Limitations  prone with rope    Knee: Self-Stretch to increase Flexion  Right;3 reps;30 seconds  Knee: Self-Stretch Limitations  12" step; AP mobilization on tibia for flexion    Gastroc Stretch  Both;3 reps;30 seconds    Gastroc Stretch Limitations  slant board      Knee/Hip Exercises: Aerobic   Nustep  5 minutes on level 2; seat 11, BLE (no UE)      Knee/Hip Exercises: Standing   Terminal Knee Extension  Right;Theraband    Theraband Level (Terminal Knee Extension)  Level 4 (Blue)    Terminal Knee Extension Limitations  20x 10 seconds      Knee/Hip Exercises: Supine   Quad Sets  1 set;10 reps;Right    Quad Sets Limitations  5" holds    Short Arc [employer]  Right;15 reps    Short Arc Quad Sets Limitations  2#, 3" holds, basketball    Knee Extension Limitations  11    Knee Flexion Limitations  73      Knee/Hip Exercises: Prone   Contract/Relax to Increase Flexion  3x 10" second contract, 30 second relax/stretch      Manual  Therapy   Manual Therapy  Joint mobilization    Manual therapy comments  completed separate from the rest of skilled interventions    Joint Mobilization  AP/PA glide to tbibiofemoral joint for flexion/extension, grade III for 3x 30-45 seconds anterior and posterior; patellar mobs sup/inf grade III             PT Education - 04/22/17 1303    Education provided  Yes    Education Details  exercise technique during session    Person(s) Educated  Patient    Methods  Explanation;Demonstration    Comprehension  Verbalized understanding;Returned demonstration       PT Short Term Goals - 04/15/17 0935      PT SHORT TERM GOAL #1   Title   Patient to be compliant with correct performance of HEP, to be updated weekly      Time  3    Period  Weeks    Status  Achieved      PT SHORT TERM GOAL #2   Title  Patient will improve ROM for Rt knee extension/flexion by to 5-95 degrees to improve functional mobility with gait and stair ambulation    Baseline  04/15/17: Right knee extension/flexion AROM 10-74 degrees    Time  3    Period  Weeks    Status  On-going      PT SHORT TERM GOAL #3   Title  Patient will perform 5x sit to stand in less than or equal to 12 seconds to improve functional LE strength and decrease fall risk     Baseline  04/15/17: Patient performed 5x sit to stand in 15. 36 seconds    Time  3    Period  Weeks    Status  On-going        PT Long Term Goals - 04/15/17 6073      PT LONG TERM GOAL #1   Title  Patient will improve ROM for Rt knee extension/flexion by to 0-110 degrees to improve functional mobility with gait and stair ambulation    Baseline  04/15/17: Patient's Rt knee extension/flexion AROM was 10-74 degrees.    Time  6    Period  Weeks    Status  On-going      PT LONG TERM GOAL #2   Title  Patient to show 5/5 MMT grade in all tested musculature in order to improve balance and performance during  gait and stair ambulation     Baseline  04/15/17: Patient is  still limited in right hip extension, right knee flexion, and right knee extension    Time  6    Period  Weeks    Status  On-going      PT LONG TERM GOAL #3   Title  Patient will be participating in regular fitness program for strength and balance training to be independent with maintaining functional mobility and reducing pain; and to return to PLOF with coaching.     Baseline  04/15/17: Patient reported he has not begun this yet.     Time  6    Period  Weeks    Status  On-going      PT LONG TERM GOAL #4   Title   Patient will perform 5x sit to stand and TUG in less than or equal to 12 seconds with LRAD to improve functional LE strength and decrease fall risk     Baseline  04/15/17: Patient performed TUG in less than 12 seconds without cane, but performed 5x sit to stand in greater than 12 seconds.     Time  6    Period  Weeks    Status  Partially Met            Plan - 04/22/17 1345    Clinical Impression Statement  Continued with established POC focusing on knee mobility. Continued to perform AP joint mobs during knee flexion stretch and had pt performing self-mobs for extension during HS stretch. Resumed joint and patellar mobs for improved flexion and extension ROM. Pt tolerated session well but continues to lack ROM in both extension and flexion as he was 11 to 73 this date (was 8-81 deg last visit). Educated pt on highs/lows of progression and that there is approximately 5deg difference between individuals measuring with goniometer, which may have impact on ROM #s this date.  Added prone kene hang to HEP for improved knee extension    Rehab Potential  Good    Clinical Impairments Affecting Rehab Potential  (+) motivated, (-) chronic knee pain    PT Frequency  3x / week    PT Duration  4 weeks    PT Treatment/Interventions  ADLs/Self Care Home Management;Electrical Stimulation;Cryotherapy;DME Instruction;Gait training;Stair training;Functional mobility training;Therapeutic  activities;Therapeutic exercise;Balance training;Neuromuscular re-education;Patient/family education;Manual techniques;Passive range of motion;Scar mobilization;Energy conservation;Taping    PT Next Visit Plan  Continue with manual techniques to improve mobility of tibfem/patellofem joint and manual therapy in lengthened position for quad and hamstring tendons due to myofascial restrictions. Continue with PNF, contract relax stretch for right quad tendon. Transition from Nustep to bike for ROM. Continue: TKE, rockerboard. Introduce movement with mobilization for knee flexion stretch    PT Home Exercise Plan  Eval: quad set, heel slide, assisted right knee flexion stretch; 2/8: SAQ, scar mobilization; 03/16/17 - TKE with green TB, low load long duration stretch; 03/23/17: Seated hamstring stretch 3 x 30 seconds 1x/day; 04/18/17 - contract relax stretch for quad/hamstring    Consulted and Agree with Plan of Care  Patient       Patient will benefit from skilled therapeutic intervention in order to improve the following deficits and impairments:  Abnormal gait, Decreased activity tolerance, Decreased endurance, Decreased knowledge of use of DME, Decreased range of motion, Decreased skin integrity, Decreased strength, Hypomobility, Increased fascial restricitons, Decreased balance, Decreased mobility, Decreased scar mobility, Difficulty walking, Increased edema, Pain, Impaired flexibility, Postural dysfunction  Visit Diagnosis: Acute pain of  right knee  Stiffness of right knee, not elsewhere classified  Other abnormalities of gait and mobility  Other symptoms and signs involving the musculoskeletal system     Problem List Patient Active Problem List   Diagnosis Date Noted  . Arthrofibrosis of knee joint, right s/p Manipulation 04/05/17 04/05/2017  . S/P total knee replacement, right 02/15/17 03/01/2017  . Primary osteoarthritis of both knees 10/13/2016  . Chest pain 06/06/2016  . Hyperlipemia  04/16/2013  . HTN (hypertension), benign 04/16/2013  . Impingement syndrome of right shoulder 03/08/2011       Geraldine Solar PT, DPT  [facility] [address] [place], [place], [address] Phone: [phone]   Fax:  [phone]  Name: Christopher [name] MRN: [id number] Date of Birth: 10-06-1960

## 2017-04-22 NOTE — Patient Instructions (Signed)
  Prone Knee Hang  Laying on stomach, place a rolled up towel underneath thigh just above the knee cap. Slide body down to the edge of table until knee cap is hanging off.   Perform 1-2x/day, 5-10 minutes each

## 2017-04-25 ENCOUNTER — Ambulatory Visit (HOSPITAL_COMMUNITY): Payer: BLUE CROSS/BLUE SHIELD

## 2017-04-25 ENCOUNTER — Telehealth (HOSPITAL_COMMUNITY): Payer: Self-pay

## 2017-04-25 NOTE — Telephone Encounter (Signed)
Patient canceled he is out of town and will not be back  in [place] on time for the appt

## 2017-04-26 ENCOUNTER — Ambulatory Visit (HOSPITAL_COMMUNITY): Payer: BLUE CROSS/BLUE SHIELD | Admitting: Physical Therapy

## 2017-04-26 DIAGNOSIS — M25561 Pain in right knee: Secondary | ICD-10-CM

## 2017-04-26 DIAGNOSIS — R2689 Other abnormalities of gait and mobility: Secondary | ICD-10-CM

## 2017-04-26 DIAGNOSIS — M25661 Stiffness of right knee, not elsewhere classified: Secondary | ICD-10-CM

## 2017-04-26 DIAGNOSIS — R29898 Other symptoms and signs involving the musculoskeletal system: Secondary | ICD-10-CM

## 2017-04-26 NOTE — Therapy (Signed)
[place], [place], [address] Phone: [phone]   Fax:  [phone]  Physical Therapy Treatment  Patient Details  Name: Christopher [name] MRN: [id number] Date of Birth: 10-09-1960 Referring Provider: [name]   Encounter Date: 04/26/2017  PT End of Session - 04/26/17 1052    Visit Number  19    Number of Visits  26    Date for PT Re-Evaluation  05/13/17    Authorization Type  [employer]    Authorization Time Period  03/07/17-04/18/17; New: 04/18/17-05/13/17    PT Start Time  3704    PT Stop Time  1030    PT Time Calculation (min)  42 min    Activity Tolerance  Patient tolerated treatment well;No increased pain    Behavior During Therapy  WFL for tasks assessed/performed       Past Medical History:  Diagnosis Date  . Arthritis   . Atrial fibrillation ([place])    a. occuring in the post-operative setting in 02/2017  . HTN (hypertension)   . Hyperlipemia     Past Surgical History:  Procedure Laterality Date  . EXAM UNDER ANESTHESIA WITH MANIPULATION OF KNEE Right 04/05/2017   Procedure: EXAM UNDER ANESTHESIA WITH MANIPULATION OF KNEE;  Surgeon: Carole Civil, MD;  Location: [facility];  Service: Orthopedics;  Laterality: Right;  . HERNIA REPAIR Right   . KNEE ARTHROSCOPY  right knee  . TOTAL KNEE ARTHROPLASTY Right 02/15/2017   Procedure: TOTAL KNEE ARTHROPLASTY;  Surgeon: Carole Civil, MD;  Location: [facility];  Service: Orthopedics;  Laterality: Right;    There were no vitals filed for this visit.  Subjective Assessment - 04/26/17 0953    Subjective  Pt states no pain just stiffness.  States they picked up his CPM yesterday    Currently in Pain?  No/denies                No data recorded       [facility] Adult PT Treatment/Exercise - 04/26/17 0001      Knee/Hip Exercises: Stretches   Passive Hamstring Stretch  Right;3 reps;30 seconds    Knee: Self-Stretch to increase  Flexion  Right;3 reps;30 seconds    Knee: Self-Stretch Limitations  12" step; AP mobilization on tibia for flexion    Gastroc Stretch  Both;3 reps;30 seconds    Gastroc Stretch Limitations  slant board      Knee/Hip Exercises: Aerobic   Nustep  5 minutes on level 2; seat 11, BLE (no UE)      Knee/Hip Exercises: Standing   Terminal Knee Extension  Right;Theraband    Theraband Level (Terminal Knee Extension)  Level 4 (Blue)    Terminal Knee Extension Limitations  20x 10 seconds      Knee/Hip Exercises: Supine   Quad Sets  1 set;10 reps;Right    Quad Sets Limitations  5" holds    Short Arc [employer]  Right;15 reps    Short Arc Quad Sets Limitations  2#, 3" holds, basketball    Knee Extension Limitations  10 AROM    Knee Flexion Limitations  75 AROM, 82 PROM      Knee/Hip Exercises: Prone   Hamstring Curl  10 reps    Contract/Relax to Increase Flexion  3x 10" second contract, 30 second relax/stretch      Manual Therapy   Manual Therapy  Joint mobilization;Myofascial release;Passive ROM    Manual therapy comments  completed separate  from the rest of skilled interventions    Joint Mobilization  AP/PA glide to tbibiofemoral joint for flexion/extension, grade III for 3x 30-45 seconds anterior and posterior; patellar mobs sup/inf grade III    Myofascial Release  to anterior knee superior and posterior to decrease adhesions and improve mobilty    Passive ROM  contract relax for flexion               PT Short Term Goals - 04/15/17 0935      PT SHORT TERM GOAL #1   Title   Patient to be compliant with correct performance of HEP, to be updated weekly      Time  3    Period  Weeks    Status  Achieved      PT SHORT TERM GOAL #2   Title  Patient will improve ROM for Rt knee extension/flexion by to 5-95 degrees to improve functional mobility with gait and stair ambulation    Baseline  04/15/17: Right knee extension/flexion AROM 10-74 degrees    Time  3    Period  Weeks    Status   On-going      PT SHORT TERM GOAL #3   Title  Patient will perform 5x sit to stand in less than or equal to 12 seconds to improve functional LE strength and decrease fall risk     Baseline  04/15/17: Patient performed 5x sit to stand in 15. 36 seconds    Time  3    Period  Weeks    Status  On-going        PT Long Term Goals - 04/15/17 1937      PT LONG TERM GOAL #1   Title  Patient will improve ROM for Rt knee extension/flexion by to 0-110 degrees to improve functional mobility with gait and stair ambulation    Baseline  04/15/17: Patient's Rt knee extension/flexion AROM was 10-74 degrees.    Time  6    Period  Weeks    Status  On-going      PT LONG TERM GOAL #2   Title  Patient to show 5/5 MMT grade in all tested musculature in order to improve balance and performance during gait and stair ambulation     Baseline  04/15/17: Patient is still limited in right hip extension, right knee flexion, and right knee extension    Time  6    Period  Weeks    Status  On-going      PT LONG TERM GOAL #3   Title  Patient will be participating in regular fitness program for strength and balance training to be independent with maintaining functional mobility and reducing pain; and to return to PLOF with coaching.     Baseline  04/15/17: Patient reported he has not begun this yet.     Time  6    Period  Weeks    Status  On-going      PT LONG TERM GOAL #4   Title   Patient will perform 5x sit to stand and TUG in less than or equal to 12 seconds with LRAD to improve functional LE strength and decrease fall risk     Baseline  04/15/17: Patient performed TUG in less than 12 seconds without cane, but performed 5x sit to stand in greater than 12 seconds.     Time  6    Period  Weeks    Status  Partially Met  Plan - 04/26/17 1053    Clinical Impression Statement  continued with focus on ROM for both flexion and extension.  ROM taken at EOS following  mobility, PROM and joint mobes with  increase of 10-75 AROM, 82 degrees PROM.  Encouraged to continue focus on ROM and completing self manual.  Instructed with MFR to tight scar tissue, mostly superior knee/distal quad.  Pt verbalized understanding.      Rehab Potential  Good    Clinical Impairments Affecting Rehab Potential  (+) motivated, (-) chronic knee pain    PT Frequency  3x / week    PT Duration  4 weeks    PT Treatment/Interventions  ADLs/Self Care Home Management;Electrical Stimulation;Cryotherapy;DME Instruction;Gait training;Stair training;Functional mobility training;Therapeutic activities;Therapeutic exercise;Balance training;Neuromuscular re-education;Patient/family education;Manual techniques;Passive range of motion;Scar mobilization;Energy conservation;Taping    PT Next Visit Plan  Continue with manual techniques to improve mobility of tibfem/patellofem joint and manual therapy in lengthened position for quad and hamstring tendons due to myofascial restrictions. Continue with PNF, contract relax stretch for right quad tendon. Transition from Nustep to bike for ROM. Continue: TKE, rockerboard. Introduce movement with mobilization for knee flexion stretch.      PT Home Exercise Plan  Eval: quad set, heel slide, assisted right knee flexion stretch; 2/8: SAQ, scar mobilization; 03/16/17 - TKE with green TB, low load long duration stretch; 03/23/17: Seated hamstring stretch 3 x 30 seconds 1x/day; 04/18/17 - contract relax stretch for quad/hamstring    Consulted and Agree with Plan of Care  Patient       Patient will benefit from skilled therapeutic intervention in order to improve the following deficits and impairments:  Abnormal gait, Decreased activity tolerance, Decreased endurance, Decreased knowledge of use of DME, Decreased range of motion, Decreased skin integrity, Decreased strength, Hypomobility, Increased fascial restricitons, Decreased balance, Decreased mobility, Decreased scar mobility, Difficulty walking, Increased  edema, Pain, Impaired flexibility, Postural dysfunction  Visit Diagnosis: Acute pain of right knee  Stiffness of right knee, not elsewhere classified  Other abnormalities of gait and mobility  Other symptoms and signs involving the musculoskeletal system     Problem List Patient Active Problem List   Diagnosis Date Noted  . Arthrofibrosis of knee joint, right s/p Manipulation 04/05/17 04/05/2017  . S/P total knee replacement, right 02/15/17 03/01/2017  . Primary osteoarthritis of both knees 10/13/2016  . Chest pain 06/06/2016  . Hyperlipemia 04/16/2013  . HTN (hypertension), benign 04/16/2013  . Impingement syndrome of right shoulder 03/08/2011   Teena Irani, PTA/CLT [phone]  Teena Irani 04/26/2017, 10:57 AM  [facility] [address] [place], [place], [address] Phone: [phone]   Fax:  [phone]  Name: ESTEVAN KERSH MRN: [id number] Date of Birth: 05-Aug-1960

## 2017-04-27 ENCOUNTER — Ambulatory Visit (HOSPITAL_COMMUNITY): Payer: BLUE CROSS/BLUE SHIELD | Admitting: Physical Therapy

## 2017-04-27 DIAGNOSIS — R29898 Other symptoms and signs involving the musculoskeletal system: Secondary | ICD-10-CM

## 2017-04-27 DIAGNOSIS — M25661 Stiffness of right knee, not elsewhere classified: Secondary | ICD-10-CM

## 2017-04-27 DIAGNOSIS — R2689 Other abnormalities of gait and mobility: Secondary | ICD-10-CM

## 2017-04-27 DIAGNOSIS — M25561 Pain in right knee: Secondary | ICD-10-CM | POA: Diagnosis not present

## 2017-04-27 NOTE — Therapy (Signed)
[place], [place], [address] Phone: [phone]   Fax:  [phone]  Physical Therapy Treatment  Patient Details  Name: Christopher [name] MRN: [id number] Date of Birth: October 05, 1960 Referring Provider: [name]   Encounter Date: 04/27/2017  PT End of Session - 04/27/17 0913    Visit Number  20    Number of Visits  26    Date for PT Re-Evaluation  05/13/17    Authorization Type  [employer] [facility]    Authorization Time Period  03/07/17-04/18/17; New: 04/18/17-05/13/17    PT Start Time  0819    PT Stop Time  0904    PT Time Calculation (min)  45 min    Activity Tolerance  Patient tolerated treatment well;No increased pain    Behavior During Therapy  WFL for tasks assessed/performed       Past Medical History:  Diagnosis Date  . Arthritis   . Atrial fibrillation ([place])    a. occuring in the post-operative setting in 02/2017  . HTN (hypertension)   . Hyperlipemia     Past Surgical History:  Procedure Laterality Date  . EXAM UNDER ANESTHESIA WITH MANIPULATION OF KNEE Right 04/05/2017   Procedure: EXAM UNDER ANESTHESIA WITH MANIPULATION OF KNEE;  Surgeon: Carole Civil, MD;  Location: [facility];  Service: Orthopedics;  Laterality: Right;  . HERNIA REPAIR Right   . KNEE ARTHROSCOPY  right knee  . TOTAL KNEE ARTHROPLASTY Right 02/15/2017   Procedure: TOTAL KNEE ARTHROPLASTY;  Surgeon: Carole Civil, MD;  Location: [facility];  Service: Orthopedics;  Laterality: Right;    There were no vitals filed for this visit.  Subjective Assessment - 04/27/17 0822    Subjective  Pt reports he is stiff today.  Says he tries to bend his knee when he walks but sometimes forgets out of habbit.    Currently in Pain?  No/denies                No data recorded       [facility] Adult PT Treatment/Exercise - 04/27/17 0001      Knee/Hip Exercises: Stretches   Passive Hamstring Stretch  Right;3 reps;30 seconds     Knee: Self-Stretch to increase Flexion  Right;3 reps;30 seconds    Knee: Self-Stretch Limitations  12" step; AP mobilization on tibia for flexion    Gastroc Stretch  Both;3 reps;30 seconds    Gastroc Stretch Limitations  slant board      Knee/Hip Exercises: Aerobic   Nustep  5 minutes on level 2; seat 9, BLE (no UE)      Knee/Hip Exercises: Standing   Terminal Knee Extension  Right;Theraband    Theraband Level (Terminal Knee Extension)  Level 4 (Blue)    Terminal Knee Extension Limitations  20x 10 seconds      Knee/Hip Exercises: Supine   Quad Sets  1 set;10 reps;Right    Quad Sets Limitations  5" holds    Heel Slides  Right;10 reps    Knee Extension Limitations  8 AROM    Knee Flexion Limitations  80 AROM      Knee/Hip Exercises: Prone   Hamstring Curl  15 reps    Contract/Relax to Increase Flexion  3x 5" second contract, 10 second relax/stretch    Prone Knee Hang  4 minutes;Limitations    Prone Knee Hang Limitations  with manual techniques and PROM 3X      Manual Therapy   Manual  Therapy  Joint mobilization;Myofascial release;Passive ROM    Manual therapy comments  completed separate from the rest of skilled interventions    Joint Mobilization  AP/PA glide to tbibiofemoral joint for flexion/extension, grade III for 3x 30-45 seconds anterior and posterior; patellar mobs sup/inf grade III    Myofascial Release  to anterior knee superior and posterior to decrease adhesions and improve mobilty    Passive ROM  contract relax for flexion               PT Short Term Goals - 04/15/17 0935      PT SHORT TERM GOAL #1   Title   Patient to be compliant with correct performance of HEP, to be updated weekly      Time  3    Period  Weeks    Status  Achieved      PT SHORT TERM GOAL #2   Title  Patient will improve ROM for Rt knee extension/flexion by to 5-95 degrees to improve functional mobility with gait and stair ambulation    Baseline  04/15/17: Right knee  extension/flexion AROM 10-74 degrees    Time  3    Period  Weeks    Status  On-going      PT SHORT TERM GOAL #3   Title  Patient will perform 5x sit to stand in less than or equal to 12 seconds to improve functional LE strength and decrease fall risk     Baseline  04/15/17: Patient performed 5x sit to stand in 15. 36 seconds    Time  3    Period  Weeks    Status  On-going        PT Long Term Goals - 04/15/17 2482      PT LONG TERM GOAL #1   Title  Patient will improve ROM for Rt knee extension/flexion by to 0-110 degrees to improve functional mobility with gait and stair ambulation    Baseline  04/15/17: Patient's Rt knee extension/flexion AROM was 10-74 degrees.    Time  6    Period  Weeks    Status  On-going      PT LONG TERM GOAL #2   Title  Patient to show 5/5 MMT grade in all tested musculature in order to improve balance and performance during gait and stair ambulation     Baseline  04/15/17: Patient is still limited in right hip extension, right knee flexion, and right knee extension    Time  6    Period  Weeks    Status  On-going      PT LONG TERM GOAL #3   Title  Patient will be participating in regular fitness program for strength and balance training to be independent with maintaining functional mobility and reducing pain; and to return to PLOF with coaching.     Baseline  04/15/17: Patient reported he has not begun this yet.     Time  6    Period  Weeks    Status  On-going      PT LONG TERM GOAL #4   Title   Patient will perform 5x sit to stand and TUG in less than or equal to 12 seconds with LRAD to improve functional LE strength and decrease fall risk     Baseline  04/15/17: Patient performed TUG in less than 12 seconds without cane, but performed 5x sit to stand in greater than 12 seconds.     Time  6  Period  Weeks    Status  Partially Met            Plan - 04/27/17 0914    Clinical Impression Statement  Continued with primary focus on ROM.  Added  prone knee hang with manual to posterior LE as well as PROM with good results.  Able to obtain AROM of 8 degrees following.  Flexion also improved today to 80 degrees at end of session following manual to anterior/medial/lateral  knee and contract relax for flexion.  Pt encouraged to continue working aggresively on ROM at home.    Rehab Potential  Good    Clinical Impairments Affecting Rehab Potential  (+) motivated, (-) chronic knee pain    PT Frequency  3x / week    PT Duration  4 weeks    PT Treatment/Interventions  ADLs/Self Care Home Management;Electrical Stimulation;Cryotherapy;DME Instruction;Gait training;Stair training;Functional mobility training;Therapeutic activities;Therapeutic exercise;Balance training;Neuromuscular re-education;Patient/family education;Manual techniques;Passive range of motion;Scar mobilization;Energy conservation;Taping    PT Next Visit Plan  Continue with manual techniques to improve mobility of tibfem/patellofem joint and manual therapy in lengthened position for quad and hamstring tendons due to myofascial restrictions. Continue with PNF, contract relax stretch for right quad tendon. Transition from Nustep to bike for ROM.  Work on ambulation to reduce antalgia.    PT Home Exercise Plan  Eval: quad set, heel slide, assisted right knee flexion stretch; 2/8: SAQ, scar mobilization; 03/16/17 - TKE with green TB, low load long duration stretch; 03/23/17: Seated hamstring stretch 3 x 30 seconds 1x/day; 04/18/17 - contract relax stretch for quad/hamstring    Consulted and Agree with Plan of Care  Patient       Patient will benefit from skilled therapeutic intervention in order to improve the following deficits and impairments:  Abnormal gait, Decreased activity tolerance, Decreased endurance, Decreased knowledge of use of DME, Decreased range of motion, Decreased skin integrity, Decreased strength, Hypomobility, Increased fascial restricitons, Decreased balance, Decreased  mobility, Decreased scar mobility, Difficulty walking, Increased edema, Pain, Impaired flexibility, Postural dysfunction  Visit Diagnosis: Acute pain of right knee  Stiffness of right knee, not elsewhere classified  Other abnormalities of gait and mobility  Other symptoms and signs involving the musculoskeletal system     Problem List Patient Active Problem List   Diagnosis Date Noted  . Arthrofibrosis of knee joint, right s/p Manipulation 04/05/17 04/05/2017  . S/P total knee replacement, right 02/15/17 03/01/2017  . Primary osteoarthritis of both knees 10/13/2016  . Chest pain 06/06/2016  . Hyperlipemia 04/16/2013  . HTN (hypertension), benign 04/16/2013  . Impingement syndrome of right shoulder 03/08/2011   Teena Irani, PTA/CLT [phone]  Teena Irani 04/27/2017, 9:24 AM  [facility] [address] [place], [place], [address] Phone: [phone]   Fax:  [phone]  Name: Christopher [name] MRN: [id number] Date of Birth: Oct 22, 1960

## 2017-05-01 ENCOUNTER — Other Ambulatory Visit: Payer: Self-pay | Admitting: Family Medicine

## 2017-05-02 ENCOUNTER — Ambulatory Visit (HOSPITAL_COMMUNITY): Payer: BLUE CROSS/BLUE SHIELD | Attending: Orthopedic Surgery

## 2017-05-02 ENCOUNTER — Other Ambulatory Visit: Payer: Self-pay | Admitting: Orthopedic Surgery

## 2017-05-02 ENCOUNTER — Encounter (HOSPITAL_COMMUNITY): Payer: Self-pay

## 2017-05-02 DIAGNOSIS — R29898 Other symptoms and signs involving the musculoskeletal system: Secondary | ICD-10-CM

## 2017-05-02 DIAGNOSIS — M25661 Stiffness of right knee, not elsewhere classified: Secondary | ICD-10-CM | POA: Insufficient documentation

## 2017-05-02 DIAGNOSIS — R2689 Other abnormalities of gait and mobility: Secondary | ICD-10-CM | POA: Insufficient documentation

## 2017-05-02 DIAGNOSIS — M24661 Ankylosis, right knee: Secondary | ICD-10-CM

## 2017-05-02 DIAGNOSIS — M25561 Pain in right knee: Secondary | ICD-10-CM

## 2017-05-02 MED ORDER — HYDROCODONE-ACETAMINOPHEN 7.5-325 MG PO TABS: 1.0000 | ORAL_TABLET | Freq: Four times a day (QID) | ORAL | 0 refills | Status: DC | PRN

## 2017-05-02 NOTE — Telephone Encounter (Signed)
ok 

## 2017-05-02 NOTE — Telephone Encounter (Signed)
Call from patient for refill:  HYDROcodone-acetaminophen (NORCO) 10-325 MG tablet 1 tablet  - [employer], [address] [place]

## 2017-05-02 NOTE — Therapy (Signed)
[place], [place], [address] Phone: [phone]   Fax:  [phone]  Physical Therapy Treatment  Patient Details  Name: Christopher [name] MRN: [id number] Date of Birth: [birth year] Referring Provider: [name]   Encounter Date: 05/02/2017  PT End of Session - 05/02/17 0951    Visit Number  21    Number of Visits  26    Date for PT Re-Evaluation  05/13/17    Authorization Type  [employer]    Authorization Time Period  03/07/17-04/18/17; New: 04/18/17-05/13/17    PT Start Time  0946    PT Stop Time  1027    PT Time Calculation (min)  41 min    Activity Tolerance  Patient tolerated treatment well;No increased pain    Behavior During Therapy  WFL for tasks assessed/performed       Past Medical History:  Diagnosis Date  . Arthritis   . Atrial fibrillation ([place])    a. occuring in the post-operative setting in 02/2017  . HTN (hypertension)   . Hyperlipemia     Past Surgical History:  Procedure Laterality Date  . EXAM UNDER ANESTHESIA WITH MANIPULATION OF KNEE Right 04/05/2017   Procedure: EXAM UNDER ANESTHESIA WITH MANIPULATION OF KNEE;  Surgeon: Carole Civil, MD;  Location: [facility];  Service: Orthopedics;  Laterality: Right;  . HERNIA REPAIR Right   . KNEE ARTHROSCOPY  right knee  . TOTAL KNEE ARTHROPLASTY Right 02/15/2017   Procedure: TOTAL KNEE ARTHROPLASTY;  Surgeon: Carole Civil, MD;  Location: [facility];  Service: Orthopedics;  Laterality: Right;    There were no vitals filed for this visit.  Subjective Assessment - 05/02/17 0952    Subjective  Pt reports he is feeling a whole lot better. He still feels sort of numb at the front of his knee but no pain.    Currently in Pain?  No/denies            [facility] Adult PT Treatment/Exercise - 05/02/17 0001      Knee/Hip Exercises: Stretches   Knee: Self-Stretch to increase Flexion  Right;3 reps;30 seconds    Knee: Self-Stretch  Limitations  12" step; AP mobilization on tibia for flexion    Gastroc Stretch  Both;3 reps;30 seconds    Gastroc Stretch Limitations  slant board      Knee/Hip Exercises: Aerobic   Stationary Bike  x5 mins for mobility, seat 15, rocking      Knee/Hip Exercises: Standing   Knee Flexion  Right;10 reps    Terminal Knee Extension  Right;Theraband    Theraband Level (Terminal Knee Extension)  Level 4 (Blue)    Terminal Knee Extension Limitations  20x 10 seconds    Rocker Board  2 minutes;Limitations    Rocker Board Limitations  R/L      Knee/Hip Exercises: Supine   Short Arc Quad Sets  Right;15 reps    Short Arc Quad Sets Limitations  2# for proprioception, 3" holds, basketball    Knee Extension Limitations  13    Knee Flexion Limitations  82    Other Supine Knee/Hip Exercises  knee flexion on medium physioball x15 reps      Knee/Hip Exercises: Prone   Hamstring Curl  15 reps    Contract/Relax to Increase Flexion  3x 10" second contract, 10 second relax/stretch      Manual Therapy   Manual Therapy  Joint mobilization;Passive ROM;Soft tissue mobilization    Manual  therapy comments  completed separate from the rest of skilled interventions    Joint Mobilization  AP/PA glide to tbibiofemoral joint for flexion/extension, grade III for 3x 30-45 seconds anterior and posterior; patellar mobs sup/inf grade III    Soft tissue mobilization  STM to medial > lateral HS during prone knee hang x4 mins           PT Education - 05/02/17 0951    Education provided  Yes    Education Details  exercise technique    Person(s) Educated  Patient    Methods  Explanation;Demonstration    Comprehension  Verbalized understanding;Returned demonstration       PT Short Term Goals - 04/15/17 0935      PT SHORT TERM GOAL #1   Title   Patient to be compliant with correct performance of HEP, to be updated weekly      Time  3    Period  Weeks    Status  Achieved      PT SHORT TERM GOAL #2   Title   Patient will improve ROM for Rt knee extension/flexion by to 5-95 degrees to improve functional mobility with gait and stair ambulation    Baseline  04/15/17: Right knee extension/flexion AROM 10-74 degrees    Time  3    Period  Weeks    Status  On-going      PT SHORT TERM GOAL #3   Title  Patient will perform 5x sit to stand in less than or equal to 12 seconds to improve functional LE strength and decrease fall risk     Baseline  04/15/17: Patient performed 5x sit to stand in 15. 36 seconds    Time  3    Period  Weeks    Status  On-going        PT Long Term Goals - 04/15/17 8242      PT LONG TERM GOAL #1   Title  Patient will improve ROM for Rt knee extension/flexion by to 0-110 degrees to improve functional mobility with gait and stair ambulation    Baseline  04/15/17: Patient's Rt knee extension/flexion AROM was 10-74 degrees.    Time  6    Period  Weeks    Status  On-going      PT LONG TERM GOAL #2   Title  Patient to show 5/5 MMT grade in all tested musculature in order to improve balance and performance during gait and stair ambulation     Baseline  04/15/17: Patient is still limited in right hip extension, right knee flexion, and right knee extension    Time  6    Period  Weeks    Status  On-going      PT LONG TERM GOAL #3   Title  Patient will be participating in regular fitness program for strength and balance training to be independent with maintaining functional mobility and reducing pain; and to return to PLOF with coaching.     Baseline  04/15/17: Patient reported he has not begun this yet.     Time  6    Period  Weeks    Status  On-going      PT LONG TERM GOAL #4   Title   Patient will perform 5x sit to stand and TUG in less than or equal to 12 seconds with LRAD to improve functional LE strength and decrease fall risk     Baseline  04/15/17: Patient performed TUG in less than 12  seconds without cane, but performed 5x sit to stand in greater than 12 seconds.     Time   6    Period  Weeks    Status  Partially Met            Plan - 05/02/17 1028    Clinical Impression Statement  Continued with established POC focusing on knee mobility. Resumed stationary bike this date to address mobility; pt only able to perform 1/2 revolutions on seat 15 (was on seat 17 in previous sessions so this is an improvement). Rest of session focused on knee mobility. Resumed rockerboard and added supine knee flexion with physioball. Ended with manual to address mobility deficits. Added manual to HS during prone knee hang for improved extension ROM. AROM 13 to 82deg this date.    Rehab Potential  Good    Clinical Impairments Affecting Rehab Potential  (+) motivated, (-) chronic knee pain    PT Frequency  3x / week    PT Duration  4 weeks    PT Treatment/Interventions  ADLs/Self Care Home Management;Electrical Stimulation;Cryotherapy;DME Instruction;Gait training;Stair training;Functional mobility training;Therapeutic activities;Therapeutic exercise;Balance training;Neuromuscular re-education;Patient/family education;Manual techniques;Passive range of motion;Scar mobilization;Energy conservation;Taping    PT Next Visit Plan  continue stationary bike for ROM; Continue with manual techniques to improve mobility of tibfem/patellofem joint and manual therapy in lengthened position for quad and hamstring tendons due to myofascial restrictions. Continue with PNF, contract relax stretch for right quad tendon. Work on ambulation to reduce antalgia.    PT Home Exercise Plan  Eval: quad set, heel slide, assisted right knee flexion stretch; 2/8: SAQ, scar mobilization; 03/16/17 - TKE with green TB, low load long duration stretch; 03/23/17: Seated hamstring stretch 3 x 30 seconds 1x/day; 04/18/17 - contract relax stretch for quad/hamstring    Consulted and Agree with Plan of Care  Patient       Patient will benefit from skilled therapeutic intervention in order to improve the following deficits and  impairments:  Abnormal gait, Decreased activity tolerance, Decreased endurance, Decreased knowledge of use of DME, Decreased range of motion, Decreased skin integrity, Decreased strength, Hypomobility, Increased fascial restricitons, Decreased balance, Decreased mobility, Decreased scar mobility, Difficulty walking, Increased edema, Pain, Impaired flexibility, Postural dysfunction  Visit Diagnosis: Acute pain of right knee  Stiffness of right knee, not elsewhere classified  Other abnormalities of gait and mobility  Other symptoms and signs involving the musculoskeletal system     Problem List Patient Active Problem List   Diagnosis Date Noted  . Arthrofibrosis of knee joint, right s/p Manipulation 04/05/17 04/05/2017  . S/P total knee replacement, right 02/15/17 03/01/2017  . Primary osteoarthritis of both knees 10/13/2016  . Chest pain 06/06/2016  . Hyperlipemia 04/16/2013  . HTN (hypertension), benign 04/16/2013  . Impingement syndrome of right shoulder 03/08/2011        Geraldine Solar PT, DPT  [facility] [address] [place], [place], [address] Phone: [phone]   Fax:  [phone]  Name: Christopher [name] MRN: [id number] Date of Birth: 1960-12-02

## 2017-05-04 ENCOUNTER — Encounter (HOSPITAL_COMMUNITY): Payer: Self-pay | Admitting: Physical Therapy

## 2017-05-04 ENCOUNTER — Ambulatory Visit (HOSPITAL_COMMUNITY): Payer: BLUE CROSS/BLUE SHIELD | Admitting: Physical Therapy

## 2017-05-04 DIAGNOSIS — M25561 Pain in right knee: Secondary | ICD-10-CM | POA: Diagnosis not present

## 2017-05-04 DIAGNOSIS — R2689 Other abnormalities of gait and mobility: Secondary | ICD-10-CM

## 2017-05-04 DIAGNOSIS — R29898 Other symptoms and signs involving the musculoskeletal system: Secondary | ICD-10-CM

## 2017-05-04 DIAGNOSIS — M25661 Stiffness of right knee, not elsewhere classified: Secondary | ICD-10-CM

## 2017-05-04 NOTE — Therapy (Signed)
[facility] [place], [place], [address] Phone: [phone]   Fax:  [phone]  Physical Therapy Treatment  Patient Details  Name: Christopher [name] MRN: [id number] Date of Birth: [birth year] Referring Provider: [name]   Encounter Date: 05/04/2017  PT End of Session - 05/04/17 0928    Visit Number  22    Number of Visits  26    Date for PT Re-Evaluation  05/13/17    Authorization Type  [employer]    Authorization Time Period  03/07/17-04/18/17; New: 04/18/17-05/13/17    PT Start Time  0347    PT Stop Time  0900    PT Time Calculation (min)  43 min    Activity Tolerance  Patient tolerated treatment well;No increased pain    Behavior During Therapy  WFL for tasks assessed/performed       Past Medical History:  Diagnosis Date  . Arthritis   . Atrial fibrillation ([place])    a. occuring in the post-operative setting in 02/2017  . HTN (hypertension)   . Hyperlipemia     Past Surgical History:  Procedure Laterality Date  . EXAM UNDER ANESTHESIA WITH MANIPULATION OF KNEE Right 04/05/2017   Procedure: EXAM UNDER ANESTHESIA WITH MANIPULATION OF KNEE;  Surgeon: Carole Civil, MD;  Location: [facility];  Service: Orthopedics;  Laterality: Right;  . HERNIA REPAIR Right   . KNEE ARTHROSCOPY  right knee  . TOTAL KNEE ARTHROPLASTY Right 02/15/2017   Procedure: TOTAL KNEE ARTHROPLASTY;  Surgeon: Carole Civil, MD;  Location: [facility];  Service: Orthopedics;  Laterality: Right;    There were no vitals filed for this visit.  Subjective Assessment - 05/04/17 0819    Subjective  Patient reported that he isn't having any pain currently. Patient stated he has been doing his exercises at home.     Currently in Pain?  No/denies    Multiple Pain Sites  No                       [facility] Adult PT Treatment/Exercise - 05/04/17 0001      Knee/Hip Exercises: Stretches   Passive Hamstring Stretch  Right;3  reps;30 seconds    Passive Hamstring Stretch Limitations  On 12 inch step    Knee: Self-Stretch to increase Flexion  Right;3 reps;30 seconds    Knee: Self-Stretch Limitations  12" step; AP mobilization on tibia for flexion    Gastroc Stretch  Both;3 reps;30 seconds    Gastroc Stretch Limitations  slant board      Knee/Hip Exercises: Aerobic   Stationary Bike  x5 mins for mobility, seat 14, rocking      Knee/Hip Exercises: Standing   Terminal Knee Extension  Right;Theraband    Theraband Level (Terminal Knee Extension)  Level 4 (Blue)    Terminal Knee Extension Limitations  20x 10 seconds    Rocker Board  2 minutes;Limitations    Rocker Board Limitations  Right and left only      Knee/Hip Exercises: Supine   Short Arc [employer]  Right;15 reps    Short Arc Quad Sets Limitations  2# ankle weight for proprioception, 3 second holds, basketball    Knee Extension Limitations  12    Knee Flexion Limitations  81      Knee/Hip Exercises: Prone   Hamstring Curl  15 reps    Contract/Relax to Increase Flexion  --    Other Prone Exercises  --  Manual Therapy   Manual Therapy  Joint mobilization;Soft tissue mobilization;Other (comment)    Manual therapy comments  completed separate from the rest of skilled interventions    Joint Mobilization  AP/PA glide to tbibiofemoral joint for flexion/extension, grade III for 3x 30-45 seconds anterior and posterior; patellar mobs sup/inf grade III    Soft tissue mobilization  Soft tissue mobilization to medial more than lateral hamstring with patient in supine.     Other Manual Therapy  Patient prone, contract relax to increase knee extension and flexion 10x 10" second contract, 10 second relax/stretch             PT Education - 05/04/17 [phone]    Education provided  Yes    Education Details  Patient was educated on purpose and technique of interventions throughout session.     Person(s) Educated  Patient    Methods   Explanation;Demonstration;Tactile cues;Verbal cues    Comprehension  Returned demonstration;Verbalized understanding       PT Short Term Goals - 04/15/17 0935      PT SHORT TERM GOAL #1   Title   Patient to be compliant with correct performance of HEP, to be updated weekly      Time  3    Period  Weeks    Status  Achieved      PT SHORT TERM GOAL #2   Title  Patient will improve ROM for Rt knee extension/flexion by to 5-95 degrees to improve functional mobility with gait and stair ambulation    Baseline  04/15/17: Right knee extension/flexion AROM 10-74 degrees    Time  3    Period  Weeks    Status  On-going      PT SHORT TERM GOAL #3   Title  Patient will perform 5x sit to stand in less than or equal to 12 seconds to improve functional LE strength and decrease fall risk     Baseline  04/15/17: Patient performed 5x sit to stand in 15. 36 seconds    Time  3    Period  Weeks    Status  On-going        PT Long Term Goals - 04/15/17 9233      PT LONG TERM GOAL #1   Title  Patient will improve ROM for Rt knee extension/flexion by to 0-110 degrees to improve functional mobility with gait and stair ambulation    Baseline  04/15/17: Patient's Rt knee extension/flexion AROM was 10-74 degrees.    Time  6    Period  Weeks    Status  On-going      PT LONG TERM GOAL #2   Title  Patient to show 5/5 MMT grade in all tested musculature in order to improve balance and performance during gait and stair ambulation     Baseline  04/15/17: Patient is still limited in right hip extension, right knee flexion, and right knee extension    Time  6    Period  Weeks    Status  On-going      PT LONG TERM GOAL #3   Title  Patient will be participating in regular fitness program for strength and balance training to be independent with maintaining functional mobility and reducing pain; and to return to PLOF with coaching.     Baseline  04/15/17: Patient reported he has not begun this yet.     Time  6     Period  Weeks    Status  On-going  PT LONG TERM GOAL #4   Title   Patient will perform 5x sit to stand and TUG in less than or equal to 12 seconds with LRAD to improve functional LE strength and decrease fall risk     Baseline  04/15/17: Patient performed TUG in less than 12 seconds without cane, but performed 5x sit to stand in greater than 12 seconds.     Time  6    Period  Weeks    Status  Partially Met            Plan - 05/04/17 0928    Clinical Impression Statement  This session continued to focus on improving patient's right knee range of motion. Continued with current established plan of care. Patient demonstrated about 12 degrees of knee extension this session which was 1 degree improved from last session, however patient's knee flexion range of motion was at 81 degrees which was 1 degree less than what was measured last session. This session continued with contract relax to increase knee flexion and also incorporated contract relax to improve knee extension range of motion. Patient is still limited in both knee flexion and extension range of motion and future sessions should continue to focus on knee range of motion.     Rehab Potential  Good    Clinical Impairments Affecting Rehab Potential  (+) motivated, (-) chronic knee pain    PT Frequency  3x / week    PT Duration  4 weeks    PT Treatment/Interventions  ADLs/Self Care Home Management;Electrical Stimulation;Cryotherapy;DME Instruction;Gait training;Stair training;Functional mobility training;Therapeutic activities;Therapeutic exercise;Balance training;Neuromuscular re-education;Patient/family education;Manual techniques;Passive range of motion;Scar mobilization;Energy conservation;Taping    PT Next Visit Plan  Consider using biodex for passive range of motion. continue stationary bike for ROM; Continue with manual techniques to improve mobility of tibfem/patellofem joint and manual therapy in lengthened position for quad and  hamstring tendons due to myofascial restrictions. Continue with PNF, contract relax stretch for right quad tendon. Work on ambulation to reduce antalgia.    PT Home Exercise Plan  Eval: quad set, heel slide, assisted right knee flexion stretch; 2/8: SAQ, scar mobilization; 03/16/17 - TKE with green TB, low load long duration stretch; 03/23/17: Seated hamstring stretch 3 x 30 seconds 1x/day; 04/18/17 - contract relax stretch for quad/hamstring    Consulted and Agree with Plan of Care  Patient       Patient will benefit from skilled therapeutic intervention in order to improve the following deficits and impairments:  Abnormal gait, Decreased activity tolerance, Decreased endurance, Decreased knowledge of use of DME, Decreased range of motion, Decreased skin integrity, Decreased strength, Hypomobility, Increased fascial restricitons, Decreased balance, Decreased mobility, Decreased scar mobility, Difficulty walking, Increased edema, Pain, Impaired flexibility, Postural dysfunction  Visit Diagnosis: Acute pain of right knee  Stiffness of right knee, not elsewhere classified  Other abnormalities of gait and mobility  Other symptoms and signs involving the musculoskeletal system     Problem List Patient Active Problem List   Diagnosis Date Noted  . Arthrofibrosis of knee joint, right s/p Manipulation 04/05/17 04/05/2017  . S/P total knee replacement, right 02/15/17 03/01/2017  . Primary osteoarthritis of both knees 10/13/2016  . Chest pain 06/06/2016  . Hyperlipemia 04/16/2013  . HTN (hypertension), benign 04/16/2013  . Impingement syndrome of right shoulder 03/08/2011    Clarene Critchley PT, DPT 10:19 AM, 05/04/17 [place], [place], [address] Phone: [phone]   Fax:  [phone]  Name: PHILLIPS GOULETTE MRN: [id number] Date of Birth: 06/19/1960

## 2017-05-06 ENCOUNTER — Encounter (HOSPITAL_COMMUNITY): Payer: Self-pay | Admitting: Physical Therapy

## 2017-05-06 ENCOUNTER — Ambulatory Visit (HOSPITAL_COMMUNITY): Payer: BLUE CROSS/BLUE SHIELD | Admitting: Physical Therapy

## 2017-05-06 DIAGNOSIS — M25561 Pain in right knee: Secondary | ICD-10-CM

## 2017-05-06 DIAGNOSIS — M25661 Stiffness of right knee, not elsewhere classified: Secondary | ICD-10-CM

## 2017-05-06 DIAGNOSIS — R29898 Other symptoms and signs involving the musculoskeletal system: Secondary | ICD-10-CM

## 2017-05-06 DIAGNOSIS — R2689 Other abnormalities of gait and mobility: Secondary | ICD-10-CM

## 2017-05-06 NOTE — Therapy (Signed)
[place], [place], [address] Phone: [phone]   Fax:  [phone]  Physical Therapy Treatment  Patient Details  Name: Christopher [name] MRN: [id number] Date of Birth: 23-Apr-1960 Referring Provider: [name]   Encounter Date: 05/06/2017  PT End of Session - 05/06/17 0826    Visit Number  23    Number of Visits  26    Date for PT Re-Evaluation  05/13/17    Authorization Type  [employer] [facility]    Authorization Time Period  03/07/17-04/18/17; New: 04/18/17-05/13/17    PT Start Time  0817    PT Stop Time  0905    PT Time Calculation (min)  48 min    Activity Tolerance  Patient tolerated treatment well;No increased pain    Behavior During Therapy  WFL for tasks assessed/performed       Past Medical History:  Diagnosis Date  . Arthritis   . Atrial fibrillation ([place])    a. occuring in the post-operative setting in 02/2017  . HTN (hypertension)   . Hyperlipemia     Past Surgical History:  Procedure Laterality Date  . EXAM UNDER ANESTHESIA WITH MANIPULATION OF KNEE Right 04/05/2017   Procedure: EXAM UNDER ANESTHESIA WITH MANIPULATION OF KNEE;  Surgeon: Carole Civil, MD;  Location: [facility];  Service: Orthopedics;  Laterality: Right;  . HERNIA REPAIR Right   . KNEE ARTHROSCOPY  right knee  . TOTAL KNEE ARTHROPLASTY Right 02/15/2017   Procedure: TOTAL KNEE ARTHROPLASTY;  Surgeon: Carole Civil, MD;  Location: [facility];  Service: Orthopedics;  Laterality: Right;    There were no vitals filed for this visit.  Subjective Assessment - 05/06/17 0820    Subjective  Patient reported that he isn't having any pain currently. Patient stated he has been doing his exercises at home.     Limitations  Lifting;Standing;Walking;House hold activities    How long can you sit comfortably?  unlimited    How long can you stand comfortably?  35-40 minutes    How long can you walk comfortably?  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  No/denies    Multiple Pain Sites  No                       [facility] Adult PT Treatment/Exercise - 05/06/17 0001      Knee/Hip Exercises: Stretches   Passive Hamstring Stretch  Right;3 reps;30 seconds    Passive Hamstring Stretch Limitations  On 12 inch step    Knee: Self-Stretch to increase Flexion  Right;3 reps;30 seconds    Knee: Self-Stretch Limitations  12" step      Knee/Hip Exercises: Standing   Terminal Knee Extension  Right;Theraband    Theraband Level (Terminal Knee Extension)  Level 4 (Blue)    Terminal Knee Extension Limitations  20x 10 seconds      Knee/Hip Exercises: Seated   Other Seated Knee/Hip Exercises  Biodex for right knee passive range of motion, therapist adjusting percentage of knee extension and flexion. 10 minutes total starting with 70% knee flexion and extension and progressing to 83% knee flexion and extension.       Knee/Hip Exercises: Supine   Knee Extension Limitations  12    Knee Flexion Limitations  83      Manual  Therapy   Manual Therapy  Joint mobilization;Soft tissue mobilization;Other (comment)    Manual therapy comments  completed separate from the rest of skilled interventions    Joint Mobilization  AP/PA glide to right tbibiofemoral joint for flexion/extension, grade III for 3x 30-45 seconds anterior and posterior    Soft tissue mobilization  Soft tissue mobilization to right hamstring patietn in prone with more focus on medial than lateral hamstring.     Other Manual Therapy  Patient prone, contract relax to increase knee extension and flexion 10x 10" second contract, 10 second relax/stretch             PT Education - 05/06/17 0825    Education provided  Yes    Education Details  Patient was educated on the purpose of the Biodex for passive range of motion and on how to stop it if needed. Patient was  educated on purpos and technique of exercises throughout session.     Person(s) Educated  Patient    Methods  Explanation;Demonstration;Tactile cues;Verbal cues    Comprehension  Verbalized understanding;Returned demonstration       PT Short Term Goals - 04/15/17 0935      PT SHORT TERM GOAL #1   Title   Patient to be compliant with correct performance of HEP, to be updated weekly      Time  3    Period  Weeks    Status  Achieved      PT SHORT TERM GOAL #2   Title  Patient will improve ROM for Rt knee extension/flexion by to 5-95 degrees to improve functional mobility with gait and stair ambulation    Baseline  04/15/17: Right knee extension/flexion AROM 10-74 degrees    Time  3    Period  Weeks    Status  On-going      PT SHORT TERM GOAL #3   Title  Patient will perform 5x sit to stand in less than or equal to 12 seconds to improve functional LE strength and decrease fall risk     Baseline  04/15/17: Patient performed 5x sit to stand in 15. 36 seconds    Time  3    Period  Weeks    Status  On-going        PT Long Term Goals - 04/15/17 1700      PT LONG TERM GOAL #1   Title  Patient will improve ROM for Rt knee extension/flexion by to 0-110 degrees to improve functional mobility with gait and stair ambulation    Baseline  04/15/17: Patient's Rt knee extension/flexion AROM was 10-74 degrees.    Time  6    Period  Weeks    Status  On-going      PT LONG TERM GOAL #2   Title  Patient to show 5/5 MMT grade in all tested musculature in order to improve balance and performance during gait and stair ambulation     Baseline  04/15/17: Patient is still limited in right hip extension, right knee flexion, and right knee extension    Time  6    Period  Weeks    Status  On-going      PT LONG TERM GOAL #3   Title  Patient will be participating in regular fitness program for strength and balance training to be independent with maintaining functional mobility and reducing pain; and to  return to PLOF with coaching.     Baseline  04/15/17: Patient reported he has not begun  this yet.     Time  6    Period  Weeks    Status  On-going      PT LONG TERM GOAL #4   Title   Patient will perform 5x sit to stand and TUG in less than or equal to 12 seconds with LRAD to improve functional LE strength and decrease fall risk     Baseline  04/15/17: Patient performed TUG in less than 12 seconds without cane, but performed 5x sit to stand in greater than 12 seconds.     Time  6    Period  Weeks    Status  Partially Met            Plan - 05/06/17 0911    Clinical Impression Statement  This session continued to focus on improving patient's right knee range of motion. This session initiated Biodex for passive range of motion with therapist adjusting the percentage of knee flexion and extension throughout to patient's tolerance. Patient was able to reach 83% of knee flexion and extension on Biodex this session. Then session progressed to patient performing stretches for knee range of motion, and other therapeutic exercises. At the end of session therapist performed manual therapy in order to improve patient's knee range of motion and mobility. Patient's knee extension range of motion remained at 12 degrees at the end of session, however patient's knee flexion AROM was found to be 83 degrees, an improvement from last session. Patient would benefit from continued skilled physical therapy in order to improve patient's range of motion, strength, and overall functional mobility.     Rehab Potential  Good    Clinical Impairments Affecting Rehab Potential  (+) motivated, (-) chronic knee pain    PT Frequency  3x / week    PT Duration  4 weeks    PT Treatment/Interventions  ADLs/Self Care Home Management;Electrical Stimulation;Cryotherapy;DME Instruction;Gait training;Stair training;Functional mobility training;Therapeutic activities;Therapeutic exercise;Balance training;Neuromuscular  re-education;Patient/family education;Manual techniques;Passive range of motion;Scar mobilization;Energy conservation;Taping    PT Next Visit Plan  Follow-up on effectiveness of Biodex for passive range of motion. Consider continuing stationary bike for ROM; Continue with manual techniques to improve mobility of tibfem/patellofem joint and manual therapy in lengthened position for quad and hamstring tendons due to myofascial restrictions. Continue with PNF, contract relax. Work on ambulation to reduce antalgia.    PT Home Exercise Plan  Eval: quad set, heel slide, assisted right knee flexion stretch; 2/8: SAQ, scar mobilization; 03/16/17 - TKE with green TB, low load long duration stretch; 03/23/17: Seated hamstring stretch 3 x 30 seconds 1x/day; 04/18/17 - contract relax stretch for quad/hamstring    Consulted and Agree with Plan of Care  Patient       Patient will benefit from skilled therapeutic intervention in order to improve the following deficits and impairments:  Abnormal gait, Decreased activity tolerance, Decreased endurance, Decreased knowledge of use of DME, Decreased range of motion, Decreased skin integrity, Decreased strength, Hypomobility, Increased fascial restricitons, Decreased balance, Decreased mobility, Decreased scar mobility, Difficulty walking, Increased edema, Pain, Impaired flexibility, Postural dysfunction  Visit Diagnosis: Acute pain of right knee  Stiffness of right knee, not elsewhere classified  Other abnormalities of gait and mobility  Other symptoms and signs involving the musculoskeletal system     Problem List Patient Active Problem List   Diagnosis Date Noted  . Arthrofibrosis of knee joint, right s/p Manipulation 04/05/17 04/05/2017  . S/P total knee replacement, right 02/15/17 03/01/2017  . Primary osteoarthritis of both  knees 10/13/2016  . Chest pain 06/06/2016  . Hyperlipemia 04/16/2013  . HTN (hypertension), benign 04/16/2013  . Impingement syndrome  of right shoulder 03/08/2011   Clarene Critchley PT, DPT 9:15 AM, 05/06/17 [place], [place], [address] Phone: [phone]   Fax:  [phone]  Name: Christopher [name] MRN: [id number] Date of Birth: 10-19-1960

## 2017-05-09 ENCOUNTER — Encounter (HOSPITAL_COMMUNITY): Payer: Self-pay

## 2017-05-09 ENCOUNTER — Ambulatory Visit (HOSPITAL_COMMUNITY): Payer: BLUE CROSS/BLUE SHIELD

## 2017-05-09 ENCOUNTER — Other Ambulatory Visit: Payer: Self-pay

## 2017-05-09 DIAGNOSIS — R2689 Other abnormalities of gait and mobility: Secondary | ICD-10-CM

## 2017-05-09 DIAGNOSIS — R29898 Other symptoms and signs involving the musculoskeletal system: Secondary | ICD-10-CM

## 2017-05-09 DIAGNOSIS — M25661 Stiffness of right knee, not elsewhere classified: Secondary | ICD-10-CM

## 2017-05-09 DIAGNOSIS — M25561 Pain in right knee: Secondary | ICD-10-CM

## 2017-05-09 NOTE — Therapy (Signed)
[place], [place], [address] Phone: [phone]   Fax:  [phone]  Physical Therapy Treatment  Patient Details  Name: MARTINE TRAGESER MRN: [id number] Date of Birth: 07/22/1960 Referring Provider: [name]   Encounter Date: 05/09/2017  PT End of Session - 05/09/17 0816    Visit Number  24    Number of Visits  26    Date for PT Re-Evaluation  05/13/17    Authorization Type  [employer]    Authorization Time Period  03/07/17-04/18/17; New: 04/18/17-05/13/17    PT Start Time  6962    PT Stop Time  0900    PT Time Calculation (min)  44 min    Activity Tolerance  Patient tolerated treatment well;No increased pain    Behavior During Therapy  WFL for tasks assessed/performed       Past Medical History:  Diagnosis Date  . Arthritis   . Atrial fibrillation ([place])    a. occuring in the post-operative setting in 02/2017  . HTN (hypertension)   . Hyperlipemia     Past Surgical History:  Procedure Laterality Date  . EXAM UNDER ANESTHESIA WITH MANIPULATION OF KNEE Right 04/05/2017   Procedure: EXAM UNDER ANESTHESIA WITH MANIPULATION OF KNEE;  Surgeon: Carole Civil, MD;  Location: [facility];  Service: Orthopedics;  Laterality: Right;  . HERNIA REPAIR Right   . KNEE ARTHROSCOPY  right knee  . TOTAL KNEE ARTHROPLASTY Right 02/15/2017   Procedure: TOTAL KNEE ARTHROPLASTY;  Surgeon: Carole Civil, MD;  Location: [facility];  Service: Orthopedics;  Laterality: Right;    There were no vitals filed for this visit.  Subjective Assessment - 05/09/17 9528    Subjective  Patient is feeling good today and reports he kept up with his exercies this weekend. He felt good after last session on biodex machine. He denies pain today.    Limitations  Lifting;Standing;Walking;House hold activities    How long can you sit comfortably?  unlimited    How long can you stand comfortably?  35-40 minutes    How long can you  walk comfortably?  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  No/denies       [facility] Adult PT Treatment/Exercise - 05/09/17 0001      Knee/Hip Exercises: Stretches   Passive Hamstring Stretch  Right;3 reps;30 seconds    Passive Hamstring Stretch Limitations  On 12 inch step; self mobilization for extension (AP glide on distal femur)    Knee: Self-Stretch to increase Flexion  Right;3 reps;30 seconds    Knee: Self-Stretch Limitations  12" step    Gastroc Stretch  Both;3 reps;30 seconds    Gastroc Stretch Limitations  slant board    Other Knee/Hip Stretches  Contract relax hamstring stretch; 3x 5-8 second contract, 30 second relax/stretch; supine      Knee/Hip Exercises: Standing   Terminal Knee Extension  Right;Theraband    Theraband Level (Terminal Knee Extension)  Level 4 (Blue)    Terminal Knee Extension Limitations  20x 10 seconds      Knee/Hip Exercises: Seated   Other Seated Knee/Hip Exercises  Biodex for right knee passive range of motion, therapist adjusting percentage of knee extension and flexion. 10 minutes total starting with 75% knee flexion and extension and progressing to 85% knee flexion and extension.  Knee/Hip Exercises: Supine   Knee Extension Limitations  10 AROM    Knee Flexion Limitations  85 AROM      Knee/Hip Exercises: Prone   Contract/Relax to Increase Flexion  3x 5-8 second contract, 30 second relax/stretch    Prone Knee Hang  Limitations;Weights    Prone Knee Hang Weights (lbs)  5    Prone Knee Hang Limitations  8 minutes with manual techniques      Manual Therapy   Manual Therapy  Soft tissue mobilization    Manual therapy comments  completed separate from the rest of skilled interventions    Soft tissue mobilization  Soft tissue mobilization to right hamstring patietn in prone with more focus on medial than lateral hamstring.     Other Manual Therapy  --         PT Education - 05/09/17 0848    Education provided  Yes    Education Details  Educated on stretches and exercise form throughout session. Educated on increasing weight for prone knee stretch at home to 5 lbs and increasing time from 10 to 20 minutes.     Person(s) Educated  Patient    Methods  Explanation    Comprehension  Verbalized understanding       PT Short Term Goals - 04/15/17 0935      PT SHORT TERM GOAL #1   Title   Patient to be compliant with correct performance of HEP, to be updated weekly      Time  3    Period  Weeks    Status  Achieved      PT SHORT TERM GOAL #2   Title  Patient will improve ROM for Rt knee extension/flexion by to 5-95 degrees to improve functional mobility with gait and stair ambulation    Baseline  04/15/17: Right knee extension/flexion AROM 10-74 degrees    Time  3    Period  Weeks    Status  On-going      PT SHORT TERM GOAL #3   Title  Patient will perform 5x sit to stand in less than or equal to 12 seconds to improve functional LE strength and decrease fall risk     Baseline  04/15/17: Patient performed 5x sit to stand in 15. 36 seconds    Time  3    Period  Weeks    Status  On-going        PT Long Term Goals - 04/15/17 4656      PT LONG TERM GOAL #1   Title  Patient will improve ROM for Rt knee extension/flexion by to 0-110 degrees to improve functional mobility with gait and stair ambulation    Baseline  04/15/17: Patient's Rt knee extension/flexion AROM was 10-74 degrees.    Time  6    Period  Weeks    Status  On-going      PT LONG TERM GOAL #2   Title  Patient to show 5/5 MMT grade in all tested musculature in order to improve balance and performance during gait and stair ambulation     Baseline  04/15/17: Patient is still limited in right hip extension, right knee flexion, and right knee extension    Time  6    Period  Weeks    Status  On-going      PT LONG TERM GOAL #3   Title  Patient will be participating in  regular fitness program for strength and balance training to be independent with  maintaining functional mobility and reducing pain; and to return to PLOF with coaching.     Baseline  04/15/17: Patient reported he has not begun this yet.     Time  6    Period  Weeks    Status  On-going      PT LONG TERM GOAL #4   Title   Patient will perform 5x sit to stand and TUG in less than or equal to 12 seconds with LRAD to improve functional LE strength and decrease fall risk     Baseline  04/15/17: Patient performed TUG in less than 12 seconds without cane, but performed 5x sit to stand in greater than 12 seconds.     Time  6    Period  Weeks    Status  Partially Met         Plan - 05/09/17 0848    Clinical Impression Statement  Continued to focus on PROM and stretching this session. Performed Biodex this session again as patient reported decreased stiffness following use last session. Continued to review stretching with overpressure for extension and performed manual to hamstring tendons on Rt knee at EOS with prone knee hang. Educated patient to increase weight for prone knee stretch to 5lbs and increase time towards 20 minutes. Continued contract relax stretch for aggressive ROM and encouraged patient to push towards end range when performing exercises. He will continue to benefit from continued skilled physical therapy to improve patient's range of motion, strength, and overall functional mobility.    Rehab Potential  Good    Clinical Impairments Affecting Rehab Potential  (+) motivated, (-) chronic knee pain    PT Frequency  3x / week    PT Duration  4 weeks    PT Treatment/Interventions  ADLs/Self Care Home Management;Electrical Stimulation;Cryotherapy;DME Instruction;Gait training;Stair training;Functional mobility training;Therapeutic activities;Therapeutic exercise;Balance training;Neuromuscular re-education;Patient/family education;Manual techniques;Passive range of motion;Scar  mobilization;Energy conservation;Taping    PT Next Visit Plan  Continue with Biodex for passive range of motion. Consider continuing stationary bike for ROM; Continue with manual techniques to improve mobility of tibfem/patellofem joint and manual therapy in lengthened position for quad and hamstring tendons due to myofascial restrictions. Continue with PNF, contract relax. Work on ambulation to reduce antalgia.    PT Home Exercise Plan  Eval: quad set, heel slide, assisted right knee flexion stretch; 2/8: SAQ, scar mobilization; 03/16/17 - TKE with green TB, low load long duration stretch; 03/23/17: Seated hamstring stretch 3 x 30 seconds 1x/day; 04/18/17 - contract relax stretch for quad/hamstring    Consulted and Agree with Plan of Care  Patient       Patient will benefit from skilled therapeutic intervention in order to improve the following deficits and impairments:  Abnormal gait, Decreased activity tolerance, Decreased endurance, Decreased knowledge of use of DME, Decreased range of motion, Decreased skin integrity, Decreased strength, Hypomobility, Increased fascial restricitons, Decreased balance, Decreased mobility, Decreased scar mobility, Difficulty walking, Increased edema, Pain, Impaired flexibility, Postural dysfunction  Visit Diagnosis: Acute pain of right knee  Stiffness of right knee, not elsewhere classified  Other abnormalities of gait and mobility  Other symptoms and signs involving the musculoskeletal system     Problem List Patient Active Problem List   Diagnosis Date Noted  . Arthrofibrosis of knee joint, right s/p Manipulation 04/05/17 04/05/2017  . S/P total knee replacement, right 02/15/17 03/01/2017  . Primary osteoarthritis of both knees 10/13/2016  . Chest pain 06/06/2016  . Hyperlipemia 04/16/2013  . HTN (hypertension), benign 04/16/2013  .  Impingement syndrome of right shoulder 03/08/2011    Kipp Brood, PT, DPT Physical Therapist with [facility]  05/09/2017 9:01 AM    [facility] [address] [place], [place], [address] Phone: [phone]   Fax:  [phone]  Name: TORETTO TINGLER MRN: [id number] Date of Birth: [birth year]

## 2017-05-11 ENCOUNTER — Encounter (HOSPITAL_COMMUNITY): Payer: Self-pay

## 2017-05-11 ENCOUNTER — Ambulatory Visit (HOSPITAL_COMMUNITY): Payer: BLUE CROSS/BLUE SHIELD

## 2017-05-11 DIAGNOSIS — M25561 Pain in right knee: Secondary | ICD-10-CM

## 2017-05-11 DIAGNOSIS — M25661 Stiffness of right knee, not elsewhere classified: Secondary | ICD-10-CM

## 2017-05-11 DIAGNOSIS — R2689 Other abnormalities of gait and mobility: Secondary | ICD-10-CM

## 2017-05-11 DIAGNOSIS — R29898 Other symptoms and signs involving the musculoskeletal system: Secondary | ICD-10-CM

## 2017-05-11 NOTE — Therapy (Signed)
[place], [place], [address] Phone: [phone]   Fax:  [phone]  Physical Therapy Treatment  Patient Details  Name: Christopher [name] MRN: [id number] Date of Birth: 10-26-1960 Referring Provider: [name]   Encounter Date: 05/11/2017  PT End of Session - 05/11/17 0824    Visit Number  25    Number of Visits  26    Date for PT Re-Evaluation  05/13/17    Authorization Type  [employer]    Authorization Time Period  03/07/17-04/18/17; New: 04/18/17-05/13/17    PT Start Time  0818    PT Stop Time  0908    PT Time Calculation (min)  50 min    Activity Tolerance  Patient tolerated treatment well;No increased pain    Behavior During Therapy  WFL for tasks assessed/performed       Past Medical History:  Diagnosis Date  . Arthritis   . Atrial fibrillation ([place])    a. occuring in the post-operative setting in 02/2017  . HTN (hypertension)   . Hyperlipemia     Past Surgical History:  Procedure Laterality Date  . EXAM UNDER ANESTHESIA WITH MANIPULATION OF KNEE Right 04/05/2017   Procedure: EXAM UNDER ANESTHESIA WITH MANIPULATION OF KNEE;  Surgeon: Carole Civil, MD;  Location: [facility];  Service: Orthopedics;  Laterality: Right;  . HERNIA REPAIR Right   . KNEE ARTHROSCOPY  right knee  . TOTAL KNEE ARTHROPLASTY Right 02/15/2017   Procedure: TOTAL KNEE ARTHROPLASTY;  Surgeon: Carole Civil, MD;  Location: [facility];  Service: Orthopedics;  Laterality: Right;    There were no vitals filed for this visit.  Subjective Assessment - 05/11/17 0823    Subjective  Pt stated knee is feeling good today, continues to have stiffness in the morning.      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  Yes    Pain Score  2     Pain Location  Knee    Pain Orientation  Right    Pain Type  Surgical pain;Chronic pain    Pain Onset   More than a month ago    Pain Frequency  Intermittent    Aggravating Factors   night time gets worse    Pain Relieving Factors  stretching makes it feel better    Effect of Pain on Daily Activities  difficutly walking but improved since before surgery                       [facility] Adult PT Treatment/Exercise - 05/11/17 0001      Knee/Hip Exercises: Stretches   Passive Hamstring Stretch  Right;3 reps;30 seconds    Passive Hamstring Stretch Limitations  On 12 inch step; self mobilization for extension (AP glide on distal femur)    Knee: Self-Stretch to increase Flexion  10 seconds    Knee: Self-Stretch Limitations  15x 10" holds on 12" step    Gastroc Stretch  Both;3 reps;30 seconds    Gastroc Stretch Limitations  slant board    Other Knee/Hip Stretches  Contract relax hamstring stretch; 3x 5-8 second contract, 30 second relax/stretch; supine      Knee/Hip Exercises: Aerobic   Other Aerobic  BIODEX PROM 96% extension; 83% flexion x 8 min      Knee/Hip Exercises: Standing   Terminal Knee  Extension  Right;Theraband    Theraband Level (Terminal Knee Extension)  Level 4 (Blue)    Terminal Knee Extension Limitations  20x 10 seconds    Gait Training  heel to toe mechanics no AD 226 ft      Knee/Hip Exercises: Supine   Knee Extension  AROM    Knee Extension Limitations  10    Knee Flexion  AROM    Knee Flexion Limitations  87      Knee/Hip Exercises: Prone   Contract/Relax to Increase Flexion  3x 5-8 second contract, 30 second relax/stretch    Prone Knee Hang  Limitations;Weights    Prone Knee Hang Weights (lbs)  5    Prone Knee Hang Limitations  8 minutes with manual techniques      Manual Therapy   Manual Therapy  Soft tissue mobilization    Manual therapy comments  completed separate from the rest of skilled interventions    Soft tissue mobilization  Soft tissue mobilization to right hamstring patietn in prone with more focus on medial than lateral hamstring.      Passive ROM  contract relax for flexion               PT Short Term Goals - 04/15/17 0935      PT SHORT TERM GOAL #1   Title   Patient to be compliant with correct performance of HEP, to be updated weekly      Time  3    Period  Weeks    Status  Achieved      PT SHORT TERM GOAL #2   Title  Patient will improve ROM for Rt knee extension/flexion by to 5-95 degrees to improve functional mobility with gait and stair ambulation    Baseline  04/15/17: Right knee extension/flexion AROM 10-74 degrees    Time  3    Period  Weeks    Status  On-going      PT SHORT TERM GOAL #3   Title  Patient will perform 5x sit to stand in less than or equal to 12 seconds to improve functional LE strength and decrease fall risk     Baseline  04/15/17: Patient performed 5x sit to stand in 15. 36 seconds    Time  3    Period  Weeks    Status  On-going        PT Long Term Goals - 04/15/17 9476      PT LONG TERM GOAL #1   Title  Patient will improve ROM for Rt knee extension/flexion by to 0-110 degrees to improve functional mobility with gait and stair ambulation    Baseline  04/15/17: Patient's Rt knee extension/flexion AROM was 10-74 degrees.    Time  6    Period  Weeks    Status  On-going      PT LONG TERM GOAL #2   Title  Patient to show 5/5 MMT grade in all tested musculature in order to improve balance and performance during gait and stair ambulation     Baseline  04/15/17: Patient is still limited in right hip extension, right knee flexion, and right knee extension    Time  6    Period  Weeks    Status  On-going      PT LONG TERM GOAL #3   Title  Patient will be participating in regular fitness program for strength and balance training to be independent with maintaining functional mobility and reducing pain; and to return  to PLOF with coaching.     Baseline  04/15/17: Patient reported he has not begun this yet.     Time  6    Period  Weeks    Status  On-going      PT LONG TERM GOAL #4    Title   Patient will perform 5x sit to stand and TUG in less than or equal to 12 seconds with LRAD to improve functional LE strength and decrease fall risk     Baseline  04/15/17: Patient performed TUG in less than 12 seconds without cane, but performed 5x sit to stand in greater than 12 seconds.     Time  6    Period  Weeks    Status  Partially Met            Plan - 05/11/17 0917    Clinical Impression Statement  Continued session focus with knee mobility.  Continued with Biodex PROM for mobility as pt reports decreased stiffness following use.  Pt continues to present with soft tissue restrictions limiting range especially medial hamstring, manual soft tissue mobilization complete to address tightness.  EOS AROM 10-87 degrees.  Reviewed gait mechanics to improve heel strike and increase strike length to improve extension.      Rehab Potential  Good    Clinical Impairments Affecting Rehab Potential  (+) motivated, (-) chronic knee pain    PT Frequency  3x / week    PT Duration  4 weeks    PT Treatment/Interventions  ADLs/Self Care Home Management;Electrical Stimulation;Cryotherapy;DME Instruction;Gait training;Stair training;Functional mobility training;Therapeutic activities;Therapeutic exercise;Balance training;Neuromuscular re-education;Patient/family education;Manual techniques;Passive range of motion;Scar mobilization;Energy conservation;Taping    PT Next Visit Plan  Reassess next session.  Continue with Biodex for passive range of motion. Consider continuing stationary bike for ROM; Continue with manual techniques to improve mobility of tibfem/patellofem joint and manual therapy in lengthened position for quad and hamstring tendons due to myofascial restrictions. Continue with PNF, contract relax. Work on ambulation to reduce antalgia.    PT Home Exercise Plan  Eval: quad set, heel slide, assisted right knee flexion stretch; 2/8: SAQ, scar mobilization; 03/16/17 - TKE with green TB, low  load long duration stretch; 03/23/17: Seated hamstring stretch 3 x 30 seconds 1x/day; 04/18/17 - contract relax stretch for quad/hamstring       Patient will benefit from skilled therapeutic intervention in order to improve the following deficits and impairments:  Abnormal gait, Decreased activity tolerance, Decreased endurance, Decreased knowledge of use of DME, Decreased range of motion, Decreased skin integrity, Decreased strength, Hypomobility, Increased fascial restricitons, Decreased balance, Decreased mobility, Decreased scar mobility, Difficulty walking, Increased edema, Pain, Impaired flexibility, Postural dysfunction  Visit Diagnosis: Acute pain of right knee  Stiffness of right knee, not elsewhere classified  Other abnormalities of gait and mobility  Other symptoms and signs involving the musculoskeletal system     Problem List Patient Active Problem List   Diagnosis Date Noted  . Arthrofibrosis of knee joint, right s/p Manipulation 04/05/17 04/05/2017  . S/P total knee replacement, right 02/15/17 03/01/2017  . Primary osteoarthritis of both knees 10/13/2016  . Chest pain 06/06/2016  . Hyperlipemia 04/16/2013  . HTN (hypertension), benign 04/16/2013  . Impingement syndrome of right shoulder 03/08/2011   Ihor Austin, LPTA; CBIS [phone]  Aldona Lento 05/11/2017, 9:25 AM  [place], [place], [address] Phone: [phone]   Fax:  [phone]  Name: Christopher [name] MRN: [id number]  Date of Birth: July 10, 1960

## 2017-05-13 ENCOUNTER — Ambulatory Visit (HOSPITAL_COMMUNITY): Payer: BLUE CROSS/BLUE SHIELD | Admitting: Physical Therapy

## 2017-05-13 ENCOUNTER — Encounter (HOSPITAL_COMMUNITY): Payer: Self-pay | Admitting: Physical Therapy

## 2017-05-13 DIAGNOSIS — R2689 Other abnormalities of gait and mobility: Secondary | ICD-10-CM

## 2017-05-13 DIAGNOSIS — M25561 Pain in right knee: Secondary | ICD-10-CM | POA: Diagnosis not present

## 2017-05-13 DIAGNOSIS — M25661 Stiffness of right knee, not elsewhere classified: Secondary | ICD-10-CM

## 2017-05-13 DIAGNOSIS — R29898 Other symptoms and signs involving the musculoskeletal system: Secondary | ICD-10-CM

## 2017-05-13 NOTE — Therapy (Signed)
[place], [place], [address] Phone: [phone]   Fax:  [phone]  Physical Therapy Treatment / Re-assessment  Patient Details  Name: Christopher [name] MRN: [id number] Date of Birth: [birth year] Referring Provider: [name] MD   Encounter Date: 05/13/2017  PT End of Session - 05/13/17 0931    Visit Number  26    Number of Visits  34    Date for PT Re-Evaluation  05/13/17    Authorization Type  [employer] Blue [employer]    Authorization Time Period  03/07/17-04/18/17; 04/18/17-05/13/17; New 05/16/17 - 06/13/17    PT Start Time  4481    PT Stop Time  0907    PT Time Calculation (min)  51 min    Activity Tolerance  Patient tolerated treatment well;No increased pain    Behavior During Therapy  WFL for tasks assessed/performed       Past Medical History:  Diagnosis Date  . Arthritis   . Atrial fibrillation ([place])    a. occuring in the post-operative setting in 02/2017  . HTN (hypertension)   . Hyperlipemia     Past Surgical History:  Procedure Laterality Date  . EXAM UNDER ANESTHESIA WITH MANIPULATION OF KNEE Right 04/05/2017   Procedure: EXAM UNDER ANESTHESIA WITH MANIPULATION OF KNEE;  Surgeon: Carole Civil, MD;  Location: [facility];  Service: Orthopedics;  Laterality: Right;  . HERNIA REPAIR Right   . KNEE ARTHROSCOPY  right knee  . TOTAL KNEE ARTHROPLASTY Right 02/15/2017   Procedure: TOTAL KNEE ARTHROPLASTY;  Surgeon: Carole Civil, MD;  Location: [facility];  Service: Orthopedics;  Laterality: Right;    There were no vitals filed for this visit.  Subjective Assessment - 05/13/17 0818    Subjective  Patient stated he is having some pain in his knee today. Patient stated that he feels like he could still benefit from therapy and that he felt the Biodex machine helped a lot.     Limitations  Lifting;Standing;Walking;House hold activities    How long can you stand comfortably?  35-40 minutes    How  long can you walk comfortably?  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  Yes    Pain Score  2     Pain Location  Knee    Pain Orientation  Right    Pain Descriptors / Indicators  Aching    Pain Type  Chronic pain;Surgical pain    Pain Onset  More than a month ago    Aggravating Factors   Putting a lot of weight on it    Pain Relieving Factors  stretching it    Effect of Pain on Daily Activities  Difficulty with walking but much better since surgery    Multiple Pain Sites  No         [facility] PT Assessment - 05/13/17 0001      Assessment   Medical Diagnosis  Right TKA    Referring Provider  [name] MD    Onset Date/Surgical Date  02/15/17    Hand Dominance  Right    Next MD Visit  06/20/17      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, want to get back to that      Cognition   Overall Cognitive Status  Within Functional Limits for tasks assessed      Observation/Other Assessments   Focus on Therapeutic Outcomes (FOTO)   49% (51% limited)      Circumferential Edema   Circumferential - Right  39.5 cm    Circumferential - Left   36 cm      Single Leg Stance   Comments  Rt LE: 15.75 seconds, Lt LE: 43.45 seconds      AROM   Right Knee Extension  10    Right Knee Flexion  87      Strength    Right Hip Flexion  5/5 was 5/5    Right Hip Extension  4+/5 was 4+/5    Right Hip ABduction  5/5 was 5/5    Left Hip Flexion  5/5 was 5/5    Left Hip Extension  5/5 was 5/5    Left Hip ABduction  5/5 was 5/5    Right Knee Flexion  5/5 was 4+/5    Right Knee Extension  4+/5 was 4+/5    Left Knee Flexion  5/5 was 5/5    Left Knee Extension  5/5 was 5/5    Right Ankle Dorsiflexion  5/5 was 5/5    Left Ankle Dorsiflexion  5/5 was 5/5      Palpation   Patella mobility  hypomobile right patella particularly with superior and inferior joint assessement    Palpation comment  Patient reported tenderness inferior and lateral to right patella      Ambulation/Gait   Ambulation/Gait  Yes    Ambulation/Gait Assistance  6: Modified independent (Device/Increase time)    Ambulation Distance (Feet)  574 Feet No AD, increased time    Assistive device  None    Gait Pattern  Decreased hip/knee flexion - right;Decreased stance time - right;Decreased step length - left;Decreased stride length;Decreased weight shift to right;Antalgic;Step-through pattern    Ambulation Surface  Level    Gait velocity  0.97 m/s      Standardized Balance Assessment   Five times sit to stand comments   13.46 seconds; no use of upper extremity, patient continues to use left leg predominately      Timed Up and Go Test   TUG  Normal TUG    Normal TUG (seconds)  7.82 without assistive device, noted antalgic gait                   [facility] Adult PT Treatment/Exercise - 05/13/17 0001      Knee/Hip Exercises: Stretches   Knee: Self-Stretch to increase Flexion  10 seconds    Knee: Self-Stretch Limitations  10x 10" holds on 12" step      Knee/Hip Exercises: Standing   Terminal Knee Extension  Right;Theraband    Theraband Level (Terminal Knee Extension)  Level 4 (Blue)    Terminal Knee Extension Limitations  20x 10 seconds      Knee/Hip Exercises: Seated   Other Seated Knee/Hip Exercises  Biodex for right knee  passive range of motion, therapist adjusting percentage of knee extension and flexion. 10 minutes total starting with 80% knee flexion and extension and progressing to 85% knee flexion and 96% knee extension. Therapist progressing the percentage to patient's tolerance.       Knee/Hip Exercises: Supine   Knee Extension  AROM    Knee Extension Limitations  10    Knee Flexion  AROM    Knee Flexion Limitations  87             PT Education - 05/13/17 0930    Education provided  Yes    Education Details  Patient was educated on findings of re-assessment and discussed plan of care for continued physical therapy. Educated patient on purpose and technique of exercsies throughout and explained how to push the stop button on the Biodex machine if needed.     Person(s) Educated  Patient    Methods  Explanation    Comprehension  Verbalized understanding       PT Short Term Goals - 05/13/17 0934      PT SHORT TERM GOAL #1   Title   Patient to be compliant with correct performance of HEP, to be updated weekly      Time  2    Period  Weeks    Status  Achieved    Target Date  05/27/17      PT SHORT TERM GOAL #2   Title  Patient will improve ROM for Rt knee extension/flexion by to 5-95 degrees to improve functional mobility with gait and stair ambulation    Baseline  05/13/17: Right knee extension/flexion AROM 10-87 degrees    Time  2    Period  Weeks    Status  On-going    Target Date  05/27/17      PT SHORT TERM GOAL #3   Title  Patient will perform 5x sit to stand in less than or equal to 12 seconds to improve functional LE strength and decrease fall risk     Baseline  05/13/17: Patient performed 5x sit to stand in 13.46 seconds    Time  2    Period  Weeks    Status  On-going    Target Date  05/27/17        PT Long Term Goals - 05/13/17 0936      PT LONG TERM GOAL #1   Title  Patient will improve ROM for Rt knee extension/flexion by to 0-110 degrees to improve functional  mobility with gait and stair ambulation    Baseline  05/13/17: Patient's Rt knee extension/flexion AROM is 10-87 degrees.    Time  4    Period  Weeks    Status  On-going    Target Date  06/10/17      PT LONG TERM GOAL #2   Title  Patient to show 5/5 MMT grade in all tested musculature in order to improve balance and performance during gait and stair ambulation     Baseline  05/13/17: Patient is still limited in right hip extension and right knee extension. Patient did improve to 5/5 strength with right knee flexion.     Time  4    Period  Weeks    Status  On-going    Target Date  06/10/17  PT LONG TERM GOAL #3   Title  Patient will be participating in regular fitness program for strength and balance training to be independent with maintaining functional mobility and reducing pain; and to return to PLOF with coaching.     Baseline  05/13/17: Patient reported he has not begun this yet.     Time  4    Period  Weeks    Status  On-going    Target Date  06/10/17      PT LONG TERM GOAL #4   Title   Patient will perform 5x sit to stand and TUG in less than or equal to 12 seconds with LRAD to improve functional LE strength and decrease fall risk     Baseline  05/13/17: Patient performed TUG in less than 12 seconds without cane, but performed 5x sit to stand in greater than 12 seconds.     Time  4    Period  Weeks    Status  Partially Met    Target Date  06/10/17            Plan - 05/13/17 0934    Clinical Impression Statement  This session performed a re-assessment of patient's goals and progress with therapy. Patient has achieved one short term goal, and patient's time performing the 5x sit to stand has improved since last re-assessment and patient's active right knee range of motion is now 10 degrees extension and 87 degrees flexion. Patient has partially met 1 long term goal, and patient has demonstrated improvement toward reaching goals as patient demonstrated improvement of 5/5  strength in right knee flexion at this assessment and some improvement in right knee active range of motion flexion at this assessment. Patient reported that he thinks the BIODEX passive range of motion machine has helped and would like to continue therapy. Therapist discussed decreasing frequency to slowly move patient to performing exercises on a home exercise program and patient agreed. The remainder of session patient performed therapeutic exercises to improve patient's right knee range of motion. Patient would benefit from additional skilled physical therapy in order to continue addressing patient's deficits in strength, balance, range of motion, and overall functional mobility.     Clinical Presentation  Stable    Clinical Presentation due to:  FOTO, 3 MWT, SLS, 5xSTS, TUG, clinical judgement    Rehab Potential  Good    Clinical Impairments Affecting Rehab Potential  (+) motivated, (-) chronic knee pain    PT Frequency  2x / week    PT Duration  4 weeks 4 additional weeks    PT Treatment/Interventions  ADLs/Self Care Home Management;Electrical Stimulation;Cryotherapy;DME Instruction;Gait training;Stair training;Functional mobility training;Therapeutic activities;Therapeutic exercise;Balance training;Neuromuscular re-education;Patient/family education;Manual techniques;Passive range of motion;Scar mobilization;Energy conservation;Taping    PT Next Visit Plan  Continue with Biodex for passive range of motion. Consider continuing stationary bike for ROM; Continue with manual techniques to improve mobility of tibfem/patellofem joint and manual therapy in lengthened position for quad and hamstring tendons due to myofascial restrictions. Continue with PNF, contract relax. Work on ambulation to reduce antalgia.    PT Home Exercise Plan  Eval: quad set, heel slide, assisted right knee flexion stretch; 2/8: SAQ, scar mobilization; 03/16/17 - TKE with green TB, low load long duration stretch; 03/23/17: Seated  hamstring stretch 3 x 30 seconds 1x/day; 04/18/17 - contract relax stretch for quad/hamstring; 05/13/17: Prone hamstring curl 2x10 1x/day, gave patient blue theraband to progress TKE    Consulted and Agree with Plan of Care  Patient       Patient will benefit from skilled therapeutic intervention in order to improve the following deficits and impairments:  Abnormal gait, Decreased activity tolerance, Decreased endurance, Decreased knowledge of use of DME, Decreased range of motion, Decreased skin integrity, Decreased strength, Hypomobility, Increased fascial restricitons, Decreased balance, Decreased mobility, Decreased scar mobility, Difficulty walking, Increased edema, Pain, Impaired flexibility, Postural dysfunction  Visit Diagnosis: Acute pain of right knee  Stiffness of right knee, not elsewhere classified  Other abnormalities of gait and mobility  Other symptoms and signs involving the musculoskeletal system     Problem List Patient Active Problem List   Diagnosis Date Noted  . Arthrofibrosis of knee joint, right s/p Manipulation 04/05/17 04/05/2017  . S/P total knee replacement, right 02/15/17 03/01/2017  . Primary osteoarthritis of both knees 10/13/2016  . Chest pain 06/06/2016  . Hyperlipemia 04/16/2013  . HTN (hypertension), benign 04/16/2013  . Impingement syndrome of right shoulder 03/08/2011   Clarene Critchley PT, DPT 10:52 AM, 05/13/17 [place], [place], [address] Phone: [phone]   Fax:  [phone]  Name: Christopher [name] MRN: [id number] Date of Birth: April 03, 1960

## 2017-05-13 NOTE — Patient Instructions (Signed)
  PRONE HAMSTRING CURLS While lying face down, slowly bend your knee as you bring your foot towards your buttock. 2 x 10 right leg  Repeat 10 Times Hold 3 Seconds Complete 2 Sets Perform 1 Time(s) a Day

## 2017-05-16 ENCOUNTER — Other Ambulatory Visit: Payer: Self-pay | Admitting: Orthopedic Surgery

## 2017-05-16 DIAGNOSIS — M24661 Ankylosis, right knee: Secondary | ICD-10-CM

## 2017-05-16 MED ORDER — HYDROCODONE-ACETAMINOPHEN 7.5-325 MG PO TABS: 1.0000 | ORAL_TABLET | Freq: Four times a day (QID) | ORAL | 0 refills | Status: DC | PRN

## 2017-05-16 NOTE — Telephone Encounter (Signed)
Patient requests refill on Hydrocodone/Acetaminophn 7.5-325 mgs.  Qty  28  Sig: Take 1 tablet by mouth every 6 (six) hours as needed for moderate pain.  Patient states he uses [facility] on [address]

## 2017-05-17 ENCOUNTER — Ambulatory Visit (HOSPITAL_COMMUNITY): Payer: BLUE CROSS/BLUE SHIELD

## 2017-05-17 ENCOUNTER — Encounter (HOSPITAL_COMMUNITY): Payer: Self-pay

## 2017-05-17 DIAGNOSIS — R2689 Other abnormalities of gait and mobility: Secondary | ICD-10-CM

## 2017-05-17 DIAGNOSIS — M25661 Stiffness of right knee, not elsewhere classified: Secondary | ICD-10-CM

## 2017-05-17 DIAGNOSIS — M25561 Pain in right knee: Secondary | ICD-10-CM

## 2017-05-17 DIAGNOSIS — R29898 Other symptoms and signs involving the musculoskeletal system: Secondary | ICD-10-CM

## 2017-05-17 NOTE — Therapy (Signed)
[place], [place], [address] Phone: [phone]   Fax:  [phone]  Physical Therapy Treatment  Patient Details  Name: Christopher [name] MRN: [id number] Date of Birth: 04/26/1960 Referring Provider: [name], MD   Encounter Date: 05/17/2017  AROM 10-89 degrees   PT End of Session - 05/17/17 0947    Visit Number  27    Number of Visits  34    Date for PT Re-Evaluation  06/13/17 reassessment complete 05/13/17    Authorization Type  [employer] Blue Shield    Authorization Time Period  03/07/17-04/18/17; 04/18/17-05/13/17; New 05/16/17 - 06/13/17    PT Start Time  0900    PT Stop Time  4128    PT Time Calculation (min)  47 min    Activity Tolerance  Patient tolerated treatment well;No increased pain    Behavior During Therapy  WFL for tasks assessed/performed       Past Medical History:  Diagnosis Date  . Arthritis   . Atrial fibrillation ([place])    a. occuring in the post-operative setting in 02/2017  . HTN (hypertension)   . Hyperlipemia     Past Surgical History:  Procedure Laterality Date  . EXAM UNDER ANESTHESIA WITH MANIPULATION OF KNEE Right 04/05/2017   Procedure: EXAM UNDER ANESTHESIA WITH MANIPULATION OF KNEE;  Surgeon: Carole Civil, MD;  Location: [facility];  Service: Orthopedics;  Laterality: Right;  . HERNIA REPAIR Right   . KNEE ARTHROSCOPY  right knee  . TOTAL KNEE ARTHROPLASTY Right 02/15/2017   Procedure: TOTAL KNEE ARTHROPLASTY;  Surgeon: Carole Civil, MD;  Location: [facility];  Service: Orthopedics;  Laterality: Right;    There were no vitals filed for this visit.  Subjective Assessment - 05/17/17 0904    Subjective  Pt stated he is feeling good today, no reports of pain just stiffness.  Feels beneficial with the Biodex machine to loosen up.    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  No/denies          [facility] PT Assessment - 05/17/17 0001      Assessment   Medical Diagnosis  Right TKA    Referring Provider  [name], MD    Onset Date/Surgical Date  02/15/17    Hand Dominance  Right    Next MD Visit  06/20/17    Prior Therapy  ~15 years ago for Right knee scope and HH for this sx ended this past saturday      Precautions   Precautions  None                   [facility] Adult PT Treatment/Exercise - 05/17/17 0001      Knee/Hip Exercises: Stretches   Knee: Self-Stretch to increase Flexion  10 seconds    Knee: Self-Stretch Limitations  10x 10" holds on 12" step      Knee/Hip Exercises: Seated   Other Seated Knee/Hip Exercises  Biodex for right knee passive range of motion, therapist adjusting percentage of knee extension and flexion. 10 minutes total starting with 80% knee flexion and extension and progressing to 85% knee flexion and 96% knee extension. Therapist progressing the percentage to patient's tolerance.       Knee/Hip Exercises: Supine   Knee Extension  PROM;3 sets    Knee Extension Limitations  10  Knee Flexion  PROM;3 sets;AROM    Knee Flexion Limitations  89      Knee/Hip Exercises: Prone   Contract/Relax to Increase Flexion  5x 10" contract and 30" relax/stretch    Other Prone Exercises  TKE 10x      Manual Therapy   Manual Therapy  Soft tissue mobilization;Myofascial release    Manual therapy comments  completed separate from the rest of skilled interventions    Edema Management  Retrograde massage to right lower extremity with right lower extremity elevated     Joint Mobilization  AP/PA glide to right tbibiofemoral joint for flexion/extension, grade III for 3x 30-45 seconds anterior and posterior    Soft tissue mobilization  Soft tissue mobilization to right hamstring patietn in prone with more focus on medial than lateral hamstring and peronals/gastroc.   Supine position quad STM    Myofascial Release  to anterior knee superior and  posterior to decrease adhesions and improve mobilty    Passive ROM  contract relax for flexion               PT Short Term Goals - 05/13/17 0934      PT SHORT TERM GOAL #1   Title   Patient to be compliant with correct performance of HEP, to be updated weekly      Time  2    Period  Weeks    Status  Achieved    Target Date  05/27/17      PT SHORT TERM GOAL #2   Title  Patient will improve ROM for Rt knee extension/flexion by to 5-95 degrees to improve functional mobility with gait and stair ambulation    Baseline  05/13/17: Right knee extension/flexion AROM 10-87 degrees    Time  2    Period  Weeks    Status  On-going    Target Date  05/27/17      PT SHORT TERM GOAL #3   Title  Patient will perform 5x sit to stand in less than or equal to 12 seconds to improve functional LE strength and decrease fall risk     Baseline  05/13/17: Patient performed 5x sit to stand in 13.46 seconds    Time  2    Period  Weeks    Status  On-going    Target Date  05/27/17        PT Long Term Goals - 05/13/17 0936      PT LONG TERM GOAL #1   Title  Patient will improve ROM for Rt knee extension/flexion by to 0-110 degrees to improve functional mobility with gait and stair ambulation    Baseline  05/13/17: Patient's Rt knee extension/flexion AROM is 10-87 degrees.    Time  4    Period  Weeks    Status  On-going    Target Date  06/10/17      PT LONG TERM GOAL #2   Title  Patient to show 5/5 MMT grade in all tested musculature in order to improve balance and performance during gait and stair ambulation     Baseline  05/13/17: Patient is still limited in right hip extension and right knee extension. Patient did improve to 5/5 strength with right knee flexion.     Time  4    Period  Weeks    Status  On-going    Target Date  06/10/17      PT LONG TERM GOAL #3   Title  Patient will be participating  in regular fitness program for strength and balance training to be independent with  maintaining functional mobility and reducing pain; and to return to PLOF with coaching.     Baseline  05/13/17: Patient reported he has not begun this yet.     Time  4    Period  Weeks    Status  On-going    Target Date  06/10/17      PT LONG TERM GOAL #4   Title   Patient will perform 5x sit to stand and TUG in less than or equal to 12 seconds with LRAD to improve functional LE strength and decrease fall risk     Baseline  05/13/17: Patient performed TUG in less than 12 seconds without cane, but performed 5x sit to stand in greater than 12 seconds.     Time  4    Period  Weeks    Status  Partially Met    Target Date  06/10/17            Plan - 05/17/17 1044    Clinical Impression Statement  Continued session focus with knee mobility.  Pt continues to display hard end feel with both flexion and extension and edema present proximal knee.  Pt wearing compression hose to visit and reports daily use.  Manual technqiues complete to reduce soft tissue restrictions to quad, hamstring (medial > lateral) and lateral gastroc/peronals, retro massage for edema control, , myofascial release proximal knee and PROM/contract relax to improve range of motion.  Pt continues to report assistance with the BIODEX PROM (increased to 95% extension and 88% flexion this session.  Due to limited ROM referral sent to MD for JAZ brace to assist with prolonged stretches to improve ROM.  AROM today 10-89 degrees.  Reviewed RICE technqiues and encouraged pt to increase frequency with ice application, same duration to assist with edema.      Rehab Potential  Good    Clinical Impairments Affecting Rehab Potential  (+) motivated, (-) chronic knee pain    PT Frequency  2x / week    PT Duration  4 weeks    PT Treatment/Interventions  ADLs/Self Care Home Management;Electrical Stimulation;Cryotherapy;DME Instruction;Gait training;Stair training;Functional mobility training;Therapeutic activities;Therapeutic exercise;Balance  training;Neuromuscular re-education;Patient/family education;Manual techniques;Passive range of motion;Scar mobilization;Energy conservation;Taping    PT Next Visit Plan  F/u on signed referral for JAZ brace extension and flexion.  Continue with Biodex for passive range of motion. Consider continuing stationary bike for ROM; Continue with manual techniques to improve mobility of tibfem/patellofem joint and manual therapy in lengthened position for quad and hamstring tendons due to myofascial restrictions. Continue with PNF, contract relax. Work on ambulation to reduce antalgia.    PT Home Exercise Plan  Eval: quad set, heel slide, assisted right knee flexion stretch; 2/8: SAQ, scar mobilization; 03/16/17 - TKE with green TB, low load long duration stretch; 03/23/17: Seated hamstring stretch 3 x 30 seconds 1x/day; 04/18/17 - contract relax stretch for quad/hamstring; 05/13/17: Prone hamstring curl 2x10 1x/day, gave patient blue theraband to progress TKE       Patient will benefit from skilled therapeutic intervention in order to improve the following deficits and impairments:  Abnormal gait, Decreased activity tolerance, Decreased endurance, Decreased knowledge of use of DME, Decreased range of motion, Decreased skin integrity, Decreased strength, Hypomobility, Increased fascial restricitons, Decreased balance, Decreased mobility, Decreased scar mobility, Difficulty walking, Increased edema, Pain, Impaired flexibility, Postural dysfunction  Visit Diagnosis: Acute pain of right knee  Stiffness of right knee,  not elsewhere classified  Other abnormalities of gait and mobility  Other symptoms and signs involving the musculoskeletal system     Problem List Patient Active Problem List   Diagnosis Date Noted  . Arthrofibrosis of knee joint, right s/p Manipulation 04/05/17 04/05/2017  . S/P total knee replacement, right 02/15/17 03/01/2017  . Primary osteoarthritis of both knees 10/13/2016  . Chest pain  06/06/2016  . Hyperlipemia 04/16/2013  . HTN (hypertension), benign 04/16/2013  . Impingement syndrome of right shoulder 03/08/2011   Ihor Austin, LPTA; CBIS [phone]  Aldona Lento 05/17/2017, 11:03 AM  [place], [place], [address] Phone: [phone]   Fax:  [phone]  Name: SLADEN PLANCARTE MRN: [id number] Date of Birth: 22-Feb-1960

## 2017-05-19 ENCOUNTER — Ambulatory Visit (HOSPITAL_COMMUNITY): Payer: BLUE CROSS/BLUE SHIELD | Admitting: Physical Therapy

## 2017-05-19 DIAGNOSIS — R2689 Other abnormalities of gait and mobility: Secondary | ICD-10-CM

## 2017-05-19 DIAGNOSIS — M25561 Pain in right knee: Secondary | ICD-10-CM

## 2017-05-19 DIAGNOSIS — R29898 Other symptoms and signs involving the musculoskeletal system: Secondary | ICD-10-CM

## 2017-05-19 DIAGNOSIS — M25661 Stiffness of right knee, not elsewhere classified: Secondary | ICD-10-CM

## 2017-05-19 NOTE — Therapy (Signed)
[place], [place], [address] Phone: [phone]   Fax:  [phone]  Physical Therapy Treatment  Patient Details  Name: Christopher [name] MRN: [id number] Date of Birth: November 21, 1960 Referring Provider: [name], MD   Encounter Date: 05/19/2017  PT End of Session - 05/19/17 1335    Visit Number  28    Number of Visits  34    Date for PT Re-Evaluation  06/13/17 reassessment complete 05/13/17    Authorization Type  [employer] Blue [employer]    Authorization Time Period  03/07/17-04/18/17; 04/18/17-05/13/17; New 05/16/17 - 06/13/17    PT Start Time  7322    PT Stop Time  1031    PT Time Calculation (min)  43 min    Activity Tolerance  Patient tolerated treatment well;No increased pain    Behavior During Therapy  WFL for tasks assessed/performed       Past Medical History:  Diagnosis Date  . Arthritis   . Atrial fibrillation ([place])    a. occuring in the post-operative setting in 02/2017  . HTN (hypertension)   . Hyperlipemia     Past Surgical History:  Procedure Laterality Date  . EXAM UNDER ANESTHESIA WITH MANIPULATION OF KNEE Right 04/05/2017   Procedure: EXAM UNDER ANESTHESIA WITH MANIPULATION OF KNEE;  Surgeon: Carole Civil, MD;  Location: [facility];  Service: Orthopedics;  Laterality: Right;  . HERNIA REPAIR Right   . KNEE ARTHROSCOPY  right knee  . TOTAL KNEE ARTHROPLASTY Right 02/15/2017   Procedure: TOTAL KNEE ARTHROPLASTY;  Surgeon: Carole Civil, MD;  Location: [facility];  Service: Orthopedics;  Laterality: Right;    There were no vitals filed for this visit.  Subjective Assessment - 05/19/17 0954    Subjective  Pt reports no pain currently.  States it aches at night.  Signed order received from MD to get JAS brace.      Currently in Pain?  No/denies                       [facility] Adult PT Treatment/Exercise - 05/19/17 0001      Knee/Hip Exercises: Stretches   Knee: Self-Stretch to  increase Flexion  10 seconds    Knee: Self-Stretch Limitations  10x 10" holds on 12" step      Knee/Hip Exercises: Seated   Other Seated Knee/Hip Exercises  Biodex for right knee passive range of motion, therapist adjusting percentage of knee extension and flexion. 10 minutes total starting with 80% knee flexion and extension and progressing to 85% knee flexion and 96% knee extension. Therapist progressing the percentage to patient's tolerance.       Knee/Hip Exercises: Supine   Knee Extension  PROM;3 sets    Knee Extension Limitations  10    Knee Flexion  PROM;3 sets;AROM    Knee Flexion Limitations  89      Knee/Hip Exercises: Prone   Hamstring Curl  10 reps    Contract/Relax to Increase Flexion  5x 10" contract and 30" relax/stretch    Other Prone Exercises  TKE 10x      Manual Therapy   Manual Therapy  Soft tissue mobilization;Myofascial release    Manual therapy comments  completed separate from the rest of skilled interventions    Edema Management  Retrograde massage to right lower extremity with right lower extremity elevated     Joint Mobilization  AP/PA glide to right tbibiofemoral joint for flexion/extension, grade  III for 3x 30-45 seconds anterior and posterior    Soft tissue mobilization  Soft tissue mobilization to right hamstring patietn in prone with more focus on medial than lateral hamstring and peronals/gastroc.   Supine position quad STM    Myofascial Release  to anterior knee superior and posterior to decrease adhesions and improve mobilty    Passive ROM  contract relax for flexion               PT Short Term Goals - 05/13/17 0934      PT SHORT TERM GOAL #1   Title   Patient to be compliant with correct performance of HEP, to be updated weekly      Time  2    Period  Weeks    Status  Achieved    Target Date  05/27/17      PT SHORT TERM GOAL #2   Title  Patient will improve ROM for Rt knee extension/flexion by to 5-95 degrees to improve functional  mobility with gait and stair ambulation    Baseline  05/13/17: Right knee extension/flexion AROM 10-87 degrees    Time  2    Period  Weeks    Status  On-going    Target Date  05/27/17      PT SHORT TERM GOAL #3   Title  Patient will perform 5x sit to stand in less than or equal to 12 seconds to improve functional LE strength and decrease fall risk     Baseline  05/13/17: Patient performed 5x sit to stand in 13.46 seconds    Time  2    Period  Weeks    Status  On-going    Target Date  05/27/17        PT Long Term Goals - 05/13/17 0936      PT LONG TERM GOAL #1   Title  Patient will improve ROM for Rt knee extension/flexion by to 0-110 degrees to improve functional mobility with gait and stair ambulation    Baseline  05/13/17: Patient's Rt knee extension/flexion AROM is 10-87 degrees.    Time  4    Period  Weeks    Status  On-going    Target Date  06/10/17      PT LONG TERM GOAL #2   Title  Patient to show 5/5 MMT grade in all tested musculature in order to improve balance and performance during gait and stair ambulation     Baseline  05/13/17: Patient is still limited in right hip extension and right knee extension. Patient did improve to 5/5 strength with right knee flexion.     Time  4    Period  Weeks    Status  On-going    Target Date  06/10/17      PT LONG TERM GOAL #3   Title  Patient will be participating in regular fitness program for strength and balance training to be independent with maintaining functional mobility and reducing pain; and to return to PLOF with coaching.     Baseline  05/13/17: Patient reported he has not begun this yet.     Time  4    Period  Weeks    Status  On-going    Target Date  06/10/17      PT LONG TERM GOAL #4   Title   Patient will perform 5x sit to stand and TUG in less than or equal to 12 seconds with LRAD to improve functional LE strength and  decrease fall risk     Baseline  05/13/17: Patient performed TUG in less than 12 seconds without  cane, but performed 5x sit to stand in greater than 12 seconds.     Time  4    Period  Weeks    Status  Partially Met    Target Date  06/10/17            Plan - 05/19/17 1339    Clinical Impression Statement  contiued focus on improving knee mobility.  completed manual to anterior and posterior knee to decrease scar tissue and adhesions.  Pt with continued use of knee high compression hose.  Order received for JAS brace and pateint measured.  All required documents faxed to [facility].  Pt pleased with biodex reporting it really helps to loosen his knee.  ROM remains 10-89.      Rehab Potential  Good    Clinical Impairments Affecting Rehab Potential  (+) motivated, (-) chronic knee pain    PT Frequency  2x / week    PT Duration  4 weeks    PT Treatment/Interventions  ADLs/Self Care Home Management;Electrical Stimulation;Cryotherapy;DME Instruction;Gait training;Stair training;Functional mobility training;Therapeutic activities;Therapeutic exercise;Balance training;Neuromuscular re-education;Patient/family education;Manual techniques;Passive range of motion;Scar mobilization;Energy conservation;Taping    PT Next Visit Plan  Continue with Biodex for passive range of motion. Continue with manual techniques to improve mobility of tibfem/patellofem joint and manual therapy in lengthened position for quad and hamstring tendons due to myofascial restrictions. Continue with PNF, contract relax. Work on ambulation to reduce antalgia.  Follow up with JAS rep contact with pateint now that order and info faxed to [facility].    PT Home Exercise Plan  Eval: quad set, heel slide, assisted right knee flexion stretch; 2/8: SAQ, scar mobilization; 03/16/17 - TKE with green TB, low load long duration stretch; 03/23/17: Seated hamstring stretch 3 x 30 seconds 1x/day; 04/18/17 - contract relax stretch for quad/hamstring; 05/13/17: Prone hamstring curl 2x10 1x/day, gave patient blue theraband to progress TKE       Patient  will benefit from skilled therapeutic intervention in order to improve the following deficits and impairments:  Abnormal gait, Decreased activity tolerance, Decreased endurance, Decreased knowledge of use of DME, Decreased range of motion, Decreased skin integrity, Decreased strength, Hypomobility, Increased fascial restricitons, Decreased balance, Decreased mobility, Decreased scar mobility, Difficulty walking, Increased edema, Pain, Impaired flexibility, Postural dysfunction  Visit Diagnosis: Acute pain of right knee  Stiffness of right knee, not elsewhere classified  Other abnormalities of gait and mobility  Other symptoms and signs involving the musculoskeletal system     Problem List Patient Active Problem List   Diagnosis Date Noted  . Arthrofibrosis of knee joint, right s/p Manipulation 04/05/17 04/05/2017  . S/P total knee replacement, right 02/15/17 03/01/2017  . Primary osteoarthritis of both knees 10/13/2016  . Chest pain 06/06/2016  . Hyperlipemia 04/16/2013  . HTN (hypertension), benign 04/16/2013  . Impingement syndrome of right shoulder 03/08/2011   Teena Irani, PTA/CLT [phone]  Teena Irani 05/19/2017, 1:44 PM  [place] [address] [place], [place], [address] Phone: [phone]   Fax:  [phone]  Name: DRAYCEN LEICHTER MRN: [id number] Date of Birth: 1960/04/16

## 2017-05-20 ENCOUNTER — Telehealth (HOSPITAL_COMMUNITY): Payer: Self-pay | Admitting: Physical Therapy

## 2017-05-24 ENCOUNTER — Ambulatory Visit (HOSPITAL_COMMUNITY): Payer: BLUE CROSS/BLUE SHIELD

## 2017-05-24 ENCOUNTER — Encounter (HOSPITAL_COMMUNITY): Payer: Self-pay

## 2017-05-24 DIAGNOSIS — M25561 Pain in right knee: Secondary | ICD-10-CM | POA: Diagnosis not present

## 2017-05-24 DIAGNOSIS — R2689 Other abnormalities of gait and mobility: Secondary | ICD-10-CM

## 2017-05-24 DIAGNOSIS — M25661 Stiffness of right knee, not elsewhere classified: Secondary | ICD-10-CM

## 2017-05-24 DIAGNOSIS — R29898 Other symptoms and signs involving the musculoskeletal system: Secondary | ICD-10-CM

## 2017-05-24 NOTE — Therapy (Signed)
Christopher [name], [place], [address] Phone: [phone]   Fax:  [phone]  Physical Therapy Treatment  Patient Details  Name: RULON ABDALLA MRN: [id number] Date of Birth: 16-Jul-1960 Referring Provider: [name], MD   Encounter Date: 05/24/2017  PT End of Session - 05/24/17 0823    Visit Number  29    Number of Visits  34    Date for PT Re-Evaluation  06/13/17 reassessment complete 05/13/17    Authorization Type  [employer] Blue Shield    Authorization Time Period  03/07/17-04/18/17; 04/18/17-05/13/17; New 05/16/17 - 06/13/17    PT Start Time  0815    PT Stop Time  0900    PT Time Calculation (min)  45 min    Activity Tolerance  Patient tolerated treatment well;No increased pain    Behavior During Therapy  WFL for tasks assessed/performed       Past Medical History:  Diagnosis Date  . Arthritis   . Atrial fibrillation ([place])    a. occuring in the post-operative setting in 02/2017  . HTN (hypertension)   . Hyperlipemia     Past Surgical History:  Procedure Laterality Date  . EXAM UNDER ANESTHESIA WITH MANIPULATION OF KNEE Right 04/05/2017   Procedure: EXAM UNDER ANESTHESIA WITH MANIPULATION OF KNEE;  Surgeon: Carole Civil, MD;  Location: [facility];  Service: Orthopedics;  Laterality: Right;  . HERNIA REPAIR Right   . KNEE ARTHROSCOPY  right knee  . TOTAL KNEE ARTHROPLASTY Right 02/15/2017   Procedure: TOTAL KNEE ARTHROPLASTY;  Surgeon: Carole Civil, MD;  Location: [facility];  Service: Orthopedics;  Laterality: Right;    There were no vitals filed for this visit.  Subjective Assessment - 05/24/17 0822    Subjective  Pt stated he is feeling good today, stated he watched 2yoa granddaughter over weekend, was tiring.  Received call to receive JAS brace in clinic on Thursday this week.      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  No/denies                       [facility] Adult PT Treatment/Exercise - 05/24/17 0001      Knee/Hip Exercises: Stretches   Passive Hamstring Stretch  Right;3 reps;30 seconds    Passive Hamstring Stretch Limitations  On 12 inch step; self mobilization for extension (AP glide on distal femur)    Quad Stretch  Right;3 reps;30 seconds;Limitations    Quad Stretch Limitations  prone with rope    Knee: Self-Stretch to increase Flexion  10 seconds    Knee: Self-Stretch Limitations  10x 10" holds on 12" step    Gastroc Stretch  Both;3 reps;30 seconds    Gastroc Stretch Limitations  slant board      Knee/Hip Exercises: Aerobic   Other Aerobic  BIODEX PROM 96% extension; 88% flexion x 10 min      Knee/Hip Exercises: Standing   Gait Training  heel to toe mechanics no AD 226 ft x 2      Knee/Hip Exercises: Seated   Other Seated Knee/Hip Exercises  Biodex for right knee passive range of motion, therapist adjusting percentage of knee extension and flexion. 10 minutes total starting with 80% knee flexion and extension and progressing to 85% knee flexion and 96% knee extension. Therapist progressing the percentage to  patient's tolerance.       Manual Therapy   Manual Therapy  Soft tissue mobilization;Myofascial release;Passive ROM    Manual therapy comments  completed separate from the rest of skilled interventions    Joint Mobilization  AP/PA glide to right tbibiofemoral joint for flexion/extension, grade III for 3x 30-45 seconds anterior and posterior    Soft tissue mobilization  Soft tissue mobilization to right hamstring patietn in prone with more focus on medial than lateral hamstring and peronals/gastroc.   Supine position quad STM               PT Short Term Goals - 05/13/17 0934      PT SHORT TERM GOAL #1   Title   Patient to be compliant with correct performance of HEP, to be updated weekly      Time  2    Period  Weeks    Status  Achieved    Target Date   05/27/17      PT SHORT TERM GOAL #2   Title  Patient will improve ROM for Rt knee extension/flexion by to 5-95 degrees to improve functional mobility with gait and stair ambulation    Baseline  05/13/17: Right knee extension/flexion AROM 10-87 degrees    Time  2    Period  Weeks    Status  On-going    Target Date  05/27/17      PT SHORT TERM GOAL #3   Title  Patient will perform 5x sit to stand in less than or equal to 12 seconds to improve functional LE strength and decrease fall risk     Baseline  05/13/17: Patient performed 5x sit to stand in 13.46 seconds    Time  2    Period  Weeks    Status  On-going    Target Date  05/27/17        PT Long Term Goals - 05/13/17 0936      PT LONG TERM GOAL #1   Title  Patient will improve ROM for Rt knee extension/flexion by to 0-110 degrees to improve functional mobility with gait and stair ambulation    Baseline  05/13/17: Patient's Rt knee extension/flexion AROM is 10-87 degrees.    Time  4    Period  Weeks    Status  On-going    Target Date  06/10/17      PT LONG TERM GOAL #2   Title  Patient to show 5/5 MMT grade in all tested musculature in order to improve balance and performance during gait and stair ambulation     Baseline  05/13/17: Patient is still limited in right hip extension and right knee extension. Patient did improve to 5/5 strength with right knee flexion.     Time  4    Period  Weeks    Status  On-going    Target Date  06/10/17      PT LONG TERM GOAL #3   Title  Patient will be participating in regular fitness program for strength and balance training to be independent with maintaining functional mobility and reducing pain; and to return to PLOF with coaching.     Baseline  05/13/17: Patient reported he has not begun this yet.     Time  4    Period  Weeks    Status  On-going    Target Date  06/10/17      PT LONG TERM GOAL #4   Title   Patient will perform  5x sit to stand and TUG in less than or equal to 12 seconds  with LRAD to improve functional LE strength and decrease fall risk     Baseline  05/13/17: Patient performed TUG in less than 12 seconds without cane, but performed 5x sit to stand in greater than 12 seconds.     Time  4    Period  Weeks    Status  Partially Met    Target Date  06/10/17            Plan - 05/24/17 1975    Clinical Impression Statement  Continued session focus with knee mobility.  Pt continues to have hard end feel with flexion and extension.  Pt with good tolerance with PROM, manual myofascial release technique and contract relax for quads and hamstrings with reports of feeling looser at EOS.  AROM 10-90 degrees.  Pt reports meeting wiht JAS representative on Thursday for JAS brace to use at home.  Increased focus with gait mechanics and encouraged pt to slow down and focus on heel strike to improve mechanics.      Rehab Potential  Good    Clinical Impairments Affecting Rehab Potential  (+) motivated, (-) chronic knee pain    PT Frequency  2x / week    PT Duration  4 weeks    PT Treatment/Interventions  ADLs/Self Care Home Management;Electrical Stimulation;Cryotherapy;DME Instruction;Gait training;Stair training;Functional mobility training;Therapeutic activities;Therapeutic exercise;Balance training;Neuromuscular re-education;Patient/family education;Manual techniques;Passive range of motion;Scar mobilization;Energy conservation;Taping    PT Next Visit Plan  Continue with Biodex for passive range of motion. Continue with manual techniques to improve mobility of tibfem/patellofem joint and manual therapy in lengthened position for quad and hamstring tendons due to myofascial restrictions. Continue with PNF, contract relax. Work on ambulation to reduce antalgia.  Follow up with JAS rep contact with pateint now that order and info faxed to [facility].    PT Home Exercise Plan  Eval: quad set, heel slide, assisted right knee flexion stretch; 2/8: SAQ, scar mobilization; 03/16/17 - TKE  with green TB, low load long duration stretch; 03/23/17: Seated hamstring stretch 3 x 30 seconds 1x/day; 04/18/17 - contract relax stretch for quad/hamstring; 05/13/17: Prone hamstring curl 2x10 1x/day, gave patient blue theraband to progress TKE       Patient will benefit from skilled therapeutic intervention in order to improve the following deficits and impairments:  Abnormal gait, Decreased activity tolerance, Decreased endurance, Decreased knowledge of use of DME, Decreased range of motion, Decreased skin integrity, Decreased strength, Hypomobility, Increased fascial restricitons, Decreased balance, Decreased mobility, Decreased scar mobility, Difficulty walking, Increased edema, Pain, Impaired flexibility, Postural dysfunction  Visit Diagnosis: Acute pain of right knee  Stiffness of right knee, not elsewhere classified  Other abnormalities of gait and mobility  Other symptoms and signs involving the musculoskeletal system     Problem List Patient Active Problem List   Diagnosis Date Noted  . Arthrofibrosis of knee joint, right s/p Manipulation 04/05/17 04/05/2017  . S/P total knee replacement, right 02/15/17 03/01/2017  . Primary osteoarthritis of both knees 10/13/2016  . Chest pain 06/06/2016  . Hyperlipemia 04/16/2013  . HTN (hypertension), benign 04/16/2013  . Impingement syndrome of right shoulder 03/08/2011   Ihor Austin, LPTA; CBIS [phone]  Aldona Lento 05/24/2017, 9:56 AM  [place], [place], [address] Phone: [phone]   Fax:  [phone]  Name: HUIE GHUMAN MRN: [id number] Date of Birth: 1960/06/09

## 2017-05-26 ENCOUNTER — Ambulatory Visit (HOSPITAL_COMMUNITY): Payer: BLUE CROSS/BLUE SHIELD | Admitting: Physical Therapy

## 2017-05-26 DIAGNOSIS — M25561 Pain in right knee: Secondary | ICD-10-CM | POA: Diagnosis not present

## 2017-05-26 DIAGNOSIS — M25661 Stiffness of right knee, not elsewhere classified: Secondary | ICD-10-CM

## 2017-05-26 DIAGNOSIS — R29898 Other symptoms and signs involving the musculoskeletal system: Secondary | ICD-10-CM

## 2017-05-26 DIAGNOSIS — R2689 Other abnormalities of gait and mobility: Secondary | ICD-10-CM

## 2017-05-26 NOTE — Therapy (Signed)
[place], [place], [address] Phone: [phone]   Fax:  [phone]  Physical Therapy Treatment  Patient Details  Name: Christopher [name] MRN: [id number] Date of Birth: [birth year] Referring Provider: [name], MD   Encounter Date: 05/26/2017  PT End of Session - 05/26/17 0902    Visit Number  30    Number of Visits  34    Date for PT Re-Evaluation  06/13/17 reassessment complete 05/13/17    Authorization Type  [employer]    Authorization Time Period  03/07/17-04/18/17; 04/18/17-05/13/17; New 05/16/17 - 06/13/17    PT Start Time  2761    PT Stop Time  0902    PT Time Calculation (min)  43 min    Activity Tolerance  Patient tolerated treatment well;No increased pain    Behavior During Therapy  WFL for tasks assessed/performed       Past Medical History:  Diagnosis Date  . Arthritis   . Atrial fibrillation ([place])    a. occuring in the post-operative setting in 02/2017  . HTN (hypertension)   . Hyperlipemia     Past Surgical History:  Procedure Laterality Date  . EXAM UNDER ANESTHESIA WITH MANIPULATION OF KNEE Right 04/05/2017   Procedure: EXAM UNDER ANESTHESIA WITH MANIPULATION OF KNEE;  Surgeon: Carole Civil, MD;  Location: [facility];  Service: Orthopedics;  Laterality: Right;  . HERNIA REPAIR Right   . KNEE ARTHROSCOPY  right knee  . TOTAL KNEE ARTHROPLASTY Right 02/15/2017   Procedure: TOTAL KNEE ARTHROPLASTY;  Surgeon: Carole Civil, MD;  Location: [facility];  Service: Orthopedics;  Laterality: Right;    There were no vitals filed for this visit.  Subjective Assessment - 05/26/17 0821    Subjective  Pt states he is doing well today. STAtes he really doesn't have pain just stiffness.  STates he spoke to the [place] (male) last Friday and she said she would have someone out this week.      Currently in Pain?  No/denies                       [facility] Adult PT  Treatment/Exercise - 05/26/17 0001      Knee/Hip Exercises: Stretches   Passive Hamstring Stretch  Right;3 reps;30 seconds    Passive Hamstring Stretch Limitations  On 12 inch step; self mobilization for extension (AP glide on distal femur)    Knee: Self-Stretch to increase Flexion  10 seconds    Knee: Self-Stretch Limitations  10x 10" holds on 12" step    Gastroc Stretch  Both;3 reps;30 seconds    Gastroc Stretch Limitations  slant board      Knee/Hip Exercises: Aerobic   Other Aerobic  BIODEX PROM starting at 80%, 100% extension; 90% flexion x 10 min      Knee/Hip Exercises: Supine   Knee Extension  PROM;3 sets    Knee Extension Limitations  10    Knee Flexion  PROM;3 sets;AROM    Knee Flexion Limitations  88      Manual Therapy   Manual Therapy  Soft tissue mobilization;Myofascial release;Passive ROM    Manual therapy comments  completed separate from the rest of skilled interventions    Joint Mobilization  --    Soft tissue mobilization  Soft tissue mobilization to right hamstring patietn in prone with more focus on medial than lateral hamstring and peronals/gastroc.   Supine position quad STM  Myofascial Release  to anterior knee superior and posterior to decrease adhesions and improve mobilty             PT Education - 05/26/17 0937    Education provided  Yes    Education Details  given lucas Ziglars number with [employer] to call and set up appt to get brace    Person(s) Educated  Patient    Methods  Explanation;Handout    Comprehension  Verbalized understanding       PT Short Term Goals - 05/13/17 0934      PT SHORT TERM GOAL #1   Title   Patient to be compliant with correct performance of HEP, to be updated weekly      Time  2    Period  Weeks    Status  Achieved    Target Date  05/27/17      PT SHORT TERM GOAL #2   Title  Patient will improve ROM for Rt knee extension/flexion by to 5-95 degrees to improve functional mobility with gait and stair ambulation     Baseline  05/13/17: Right knee extension/flexion AROM 10-87 degrees    Time  2    Period  Weeks    Status  On-going    Target Date  05/27/17      PT SHORT TERM GOAL #3   Title  Patient will perform 5x sit to stand in less than or equal to 12 seconds to improve functional LE strength and decrease fall risk     Baseline  05/13/17: Patient performed 5x sit to stand in 13.46 seconds    Time  2    Period  Weeks    Status  On-going    Target Date  05/27/17        PT Long Term Goals - 05/13/17 0936      PT LONG TERM GOAL #1   Title  Patient will improve ROM for Rt knee extension/flexion by to 0-110 degrees to improve functional mobility with gait and stair ambulation    Baseline  05/13/17: Patient's Rt knee extension/flexion AROM is 10-87 degrees.    Time  4    Period  Weeks    Status  On-going    Target Date  06/10/17      PT LONG TERM GOAL #2   Title  Patient to show 5/5 MMT grade in all tested musculature in order to improve balance and performance during gait and stair ambulation     Baseline  05/13/17: Patient is still limited in right hip extension and right knee extension. Patient did improve to 5/5 strength with right knee flexion.     Time  4    Period  Weeks    Status  On-going    Target Date  06/10/17      PT LONG TERM GOAL #3   Title  Patient will be participating in regular fitness program for strength and balance training to be independent with maintaining functional mobility and reducing pain; and to return to PLOF with coaching.     Baseline  05/13/17: Patient reported he has not begun this yet.     Time  4    Period  Weeks    Status  On-going    Target Date  06/10/17      PT LONG TERM GOAL #4   Title   Patient will perform 5x sit to stand and TUG in less than or equal to 12 seconds with LRAD to improve  functional LE strength and decrease fall risk     Baseline  05/13/17: Patient performed TUG in less than 12 seconds without cane, but performed 5x sit to stand in  greater than 12 seconds.     Time  4    Period  Weeks    Status  Partially Met    Target Date  06/10/17            Plan - 05/26/17 7340    Clinical Impression Statement  continued with primary focus on improving knee flexion and extension.  ROM 10-88 degrees today.  Secretary phoned Norm Parcel with [employer] and he was unaware of meeting today.  Requested pateint call him and set up appt.  Number and instructions given to pateint. continued with myofascial and soft tissue techiques to anterior and posterior knee and surrounding musculature to decrease adhesions and further improve ROM.  Able to increase bildex to 100% extnesion and 95% flexion at end of session.  Pt reported feeling looser at EOS and noted improvement in ambulaton without AD.     Rehab Potential  Good    Clinical Impairments Affecting Rehab Potential  (+) motivated, (-) chronic knee pain    PT Frequency  2x / week    PT Duration  4 weeks    PT Treatment/Interventions  ADLs/Self Care Home Management;Electrical Stimulation;Cryotherapy;DME Instruction;Gait training;Stair training;Functional mobility training;Therapeutic activities;Therapeutic exercise;Balance training;Neuromuscular re-education;Patient/family education;Manual techniques;Passive range of motion;Scar mobilization;Energy conservation;Taping    PT Next Visit Plan  Continue with Biodex for passive range of motion. Continue with manual techniques to improve mobility of tibfem/patellofem joint and manual therapy in lengthened position for quad and hamstring tendons due to myofascial restrictions. Continue with PNF, contract relax. Work on ambulation to reduce antalgia.  Follow up with [employer] rep contact.     PT Home Exercise Plan  Eval: quad set, heel slide, assisted right knee flexion stretch; 2/8: SAQ, scar mobilization; 03/16/17 - TKE with green TB, low load long duration stretch; 03/23/17: Seated hamstring stretch 3 x 30 seconds 1x/day; 04/18/17 - contract relax stretch for  quad/hamstring; 05/13/17: Prone hamstring curl 2x10 1x/day, gave patient blue theraband to progress TKE       Patient will benefit from skilled therapeutic intervention in order to improve the following deficits and impairments:  Abnormal gait, Decreased activity tolerance, Decreased endurance, Decreased knowledge of use of DME, Decreased range of motion, Decreased skin integrity, Decreased strength, Hypomobility, Increased fascial restricitons, Decreased balance, Decreased mobility, Decreased scar mobility, Difficulty walking, Increased edema, Pain, Impaired flexibility, Postural dysfunction  Visit Diagnosis: Acute pain of right knee  Stiffness of right knee, not elsewhere classified  Other abnormalities of gait and mobility  Other symptoms and signs involving the musculoskeletal system     Problem List Patient Active Problem List   Diagnosis Date Noted  . Arthrofibrosis of knee joint, right s/p Manipulation 04/05/17 04/05/2017  . S/P total knee replacement, right 02/15/17 03/01/2017  . Primary osteoarthritis of both knees 10/13/2016  . Chest pain 06/06/2016  . Hyperlipemia 04/16/2013  . HTN (hypertension), benign 04/16/2013  . Impingement syndrome of right shoulder 03/08/2011   Teena Irani, PTA/CLT [phone]  Teena Irani 05/26/2017, 9:41 AM  [place] [address] [place], [place], [address] Phone: [phone]   Fax:  [phone]  Name: FLOYD WADE MRN: [id number] Date of Birth: 25-May-1960

## 2017-05-27 ENCOUNTER — Other Ambulatory Visit: Payer: Self-pay | Admitting: Family Medicine

## 2017-05-30 ENCOUNTER — Other Ambulatory Visit: Payer: Self-pay | Admitting: Orthopedic Surgery

## 2017-05-30 DIAGNOSIS — M24661 Ankylosis, right knee: Secondary | ICD-10-CM

## 2017-05-30 MED ORDER — HYDROCODONE-ACETAMINOPHEN 5-325 MG PO TABS: 1.0000 | ORAL_TABLET | Freq: Four times a day (QID) | ORAL | 0 refills | Status: DC | PRN

## 2017-05-30 NOTE — Telephone Encounter (Signed)
Patient called for refill:  HYDROcodone-acetaminophen (NORCO) 7.5-325 MG tablet 28 tablet  Pharmacy is [facility], [facility]

## 2017-05-30 NOTE — Telephone Encounter (Signed)
This +2 refills will need follow-up office visit 

## 2017-05-30 NOTE — Telephone Encounter (Signed)
Dose changed

## 2017-05-31 ENCOUNTER — Ambulatory Visit (HOSPITAL_COMMUNITY): Payer: BLUE CROSS/BLUE SHIELD

## 2017-05-31 ENCOUNTER — Encounter (HOSPITAL_COMMUNITY): Payer: Self-pay

## 2017-05-31 DIAGNOSIS — R2689 Other abnormalities of gait and mobility: Secondary | ICD-10-CM

## 2017-05-31 DIAGNOSIS — R29898 Other symptoms and signs involving the musculoskeletal system: Secondary | ICD-10-CM

## 2017-05-31 DIAGNOSIS — M25661 Stiffness of right knee, not elsewhere classified: Secondary | ICD-10-CM

## 2017-05-31 DIAGNOSIS — M25561 Pain in right knee: Secondary | ICD-10-CM

## 2017-05-31 NOTE — Therapy (Signed)
[name], [place], [address] Phone: [phone]   Fax:  [phone]  Physical Therapy Treatment  Patient Details  Name: Christopher [name] MRN: [id number] Date of Birth: [birth year] Referring Provider: [name], MD   Encounter Date: 05/31/2017  PT End of Session - 05/31/17 0826    Visit Number  31    Number of Visits  34    Date for PT Re-Evaluation  06/13/17 Reassessment complete on 05/13/17    Authorization Type  [employer] Blue [employer]    Authorization Time Period  03/07/17-04/18/17; 04/18/17-05/13/17; New 05/16/17 - 06/13/17    PT Start Time  0815    PT Stop Time  0900    PT Time Calculation (min)  45 min    Activity Tolerance  Patient tolerated treatment well;No increased pain    Behavior During Therapy  WFL for tasks assessed/performed       Past Medical History:  Diagnosis Date  . Arthritis   . Atrial fibrillation ([place])    a. occuring in the post-operative setting in 02/2017  . HTN (hypertension)   . Hyperlipemia     Past Surgical History:  Procedure Laterality Date  . EXAM UNDER ANESTHESIA WITH MANIPULATION OF KNEE Right 04/05/2017   Procedure: EXAM UNDER ANESTHESIA WITH MANIPULATION OF KNEE;  Surgeon: Carole Civil, MD;  Location: [facility];  Service: Orthopedics;  Laterality: Right;  . HERNIA REPAIR Right   . KNEE ARTHROSCOPY  right knee  . TOTAL KNEE ARTHROPLASTY Right 02/15/2017   Procedure: TOTAL KNEE ARTHROPLASTY;  Surgeon: Carole Civil, MD;  Location: [facility];  Service: Orthopedics;  Laterality: Right;    There were no vitals filed for this visit.  Subjective Assessment - 05/31/17 0823    Subjective  Pt reports he received [employer] braces for flexion and extension, began with them yesterday and feels comfortable with the new braces.  Knee continues to be stiff more at night than in morning.      Patient Stated Goals  Like to walk with no device and [place]    Currently in Pain?  Yes    Pain Score  2     Pain Location  Knee    Pain Orientation  Right    Pain Descriptors / Indicators  Tightness    Pain Type  Surgical pain    Pain Onset  More than a month ago    Pain Frequency  Intermittent    Aggravating Factors   Putting a lot of weight on it    Pain Relieving Factors  stretching it    Effect of Pain on Daily Activities  Difficulty with walking but much better since surgery                       [facility] Adult PT Treatment/Exercise - 05/31/17 0001      Knee/Hip Exercises: Stretches   Passive Hamstring Stretch  Right;3 reps;30 seconds    Passive Hamstring Stretch Limitations  On 12 inch step; self mobilization for extension (AP glide on distal femur)    Knee: Self-Stretch to increase Flexion  10 seconds    Knee: Self-Stretch Limitations  10x 10" holds on 12" step    Gastroc Stretch  Both;3 reps;30 seconds    Gastroc Stretch Limitations  slant board      Knee/Hip Exercises: Aerobic   Other Aerobic  BIODEX PROM starting at  80%, 100% extension; 90% flexion x 10 min      Knee/Hip Exercises: Standing   Knee Flexion  Right;10 reps;Limitations    Knee Flexion Limitations  off 6in step height    Step Down  10 reps;Right;Hand Hold: 1;Step Height: 6";Limitations    Step Down Limitations  for knee flexion    Gait Training  heel to toe mechanics no AD 226 ft x 2      Knee/Hip Exercises: Prone   Contract/Relax to Increase Flexion  5x 10" contract and 30" relax/stretch    Other Prone Exercises  TKE 10x      Manual Therapy   Manual Therapy  Soft tissue mobilization;Myofascial release;Passive ROM    Manual therapy comments  completed separate from the rest of skilled interventions    Soft tissue mobilization  Soft tissue mobilization to right hamstring patietn in prone with more focus on medial than lateral hamstring and peronals/gastroc.   Supine position quad STM    Myofascial Release  to anterior knee superior and posterior  to decrease adhesions and improve mobilty    Passive ROM  contract relax for flexion               PT Short Term Goals - 05/13/17 0934      PT SHORT TERM GOAL #1   Title   Patient to be compliant with correct performance of HEP, to be updated weekly      Time  2    Period  Weeks    Status  Achieved    Target Date  05/27/17      PT SHORT TERM GOAL #2   Title  Patient will improve ROM for Rt knee extension/flexion by to 5-95 degrees to improve functional mobility with gait and stair ambulation    Baseline  05/13/17: Right knee extension/flexion AROM 10-87 degrees    Time  2    Period  Weeks    Status  On-going    Target Date  05/27/17      PT SHORT TERM GOAL #3   Title  Patient will perform 5x sit to stand in less than or equal to 12 seconds to improve functional LE strength and decrease fall risk     Baseline  05/13/17: Patient performed 5x sit to stand in 13.46 seconds    Time  2    Period  Weeks    Status  On-going    Target Date  05/27/17        PT Long Term Goals - 05/13/17 0936      PT LONG TERM GOAL #1   Title  Patient will improve ROM for Rt knee extension/flexion by to 0-110 degrees to improve functional mobility with gait and stair ambulation    Baseline  05/13/17: Patient's Rt knee extension/flexion AROM is 10-87 degrees.    Time  4    Period  Weeks    Status  On-going    Target Date  06/10/17      PT LONG TERM GOAL #2   Title  Patient to show 5/5 MMT grade in all tested musculature in order to improve balance and performance during gait and stair ambulation     Baseline  05/13/17: Patient is still limited in right hip extension and right knee extension. Patient did improve to 5/5 strength with right knee flexion.     Time  4    Period  Weeks    Status  On-going    Target Date  06/10/17      PT LONG TERM GOAL #3   Title  Patient will be participating in regular fitness program for strength and balance training to be independent with maintaining  functional mobility and reducing pain; and to return to PLOF with coaching.     Baseline  05/13/17: Patient reported he has not begun this yet.     Time  4    Period  Weeks    Status  On-going    Target Date  06/10/17      PT LONG TERM GOAL #4   Title   Patient will perform 5x sit to stand and TUG in less than or equal to 12 seconds with LRAD to improve functional LE strength and decrease fall risk     Baseline  05/13/17: Patient performed TUG in less than 12 seconds without cane, but performed 5x sit to stand in greater than 12 seconds.     Time  4    Period  Weeks    Status  Partially Met    Target Date  06/10/17            Plan - 05/31/17 7741    Clinical Impression Statement  Pt continues to present with hard end feel with both flexion and extension.  Session focus with knee mobility.  Continued with gait training to improve stance phase and heel to toe sequence, PROM wiht biodex and manual technqiues to address myofascial and soft tissue restrictions.  AROM 9-89 degrees.  Pt reports good understanding and able to verbalize appropriate wear time with new [facility] brace.    Rehab Potential  Good    Clinical Impairments Affecting Rehab Potential  (+) motivated, (-) chronic knee pain    PT Frequency  2x / week    PT Duration  4 weeks    PT Treatment/Interventions  ADLs/Self Care Home Management;Electrical Stimulation;Cryotherapy;DME Instruction;Gait training;Stair training;Functional mobility training;Therapeutic activities;Therapeutic exercise;Balance training;Neuromuscular re-education;Patient/family education;Manual techniques;Passive range of motion;Scar mobilization;Energy conservation;Taping    PT Next Visit Plan  Continue with Biodex for passive range of motion. Continue with manual techniques to improve mobility of tibfem/patellofem joint and manual therapy in lengthened position for quad and hamstring tendons due to myofascial restrictions. Continue with PNF, contract relax. Work on  ambulation to reduce antalgia.      PT Home Exercise Plan  Eval: quad set, heel slide, assisted right knee flexion stretch; 2/8: SAQ, scar mobilization; 03/16/17 - TKE with green TB, low load long duration stretch; 03/23/17: Seated hamstring stretch 3 x 30 seconds 1x/day; 04/18/17 - contract relax stretch for quad/hamstring; 05/13/17: Prone hamstring curl 2x10 1x/day, gave patient blue theraband to progress TKE       Patient will benefit from skilled therapeutic intervention in order to improve the following deficits and impairments:  Abnormal gait, Decreased activity tolerance, Decreased endurance, Decreased knowledge of use of DME, Decreased range of motion, Decreased skin integrity, Decreased strength, Hypomobility, Increased fascial restricitons, Decreased balance, Decreased mobility, Decreased scar mobility, Difficulty walking, Increased edema, Pain, Impaired flexibility, Postural dysfunction  Visit Diagnosis: Acute pain of right knee  Stiffness of right knee, not elsewhere classified  Other abnormalities of gait and mobility  Other symptoms and signs involving the musculoskeletal system     Problem List Patient Active Problem List   Diagnosis Date Noted  . Arthrofibrosis of knee joint, right s/p Manipulation 04/05/17 04/05/2017  . S/P total knee replacement, right 02/15/17 03/01/2017  . Primary osteoarthritis of both knees 10/13/2016  . Chest pain  06/06/2016  . Hyperlipemia 04/16/2013  . HTN (hypertension), benign 04/16/2013  . Impingement syndrome of right shoulder 03/08/2011   Ihor Austin, LPTA; CBIS [phone]  Aldona Lento 05/31/2017, 9:10 AM  [facility], [place], [address] Phone: [phone]   Fax:  [phone]  Name: TREVAR BOEHRINGER MRN: [id number] Date of Birth: [birth year]

## 2017-06-02 ENCOUNTER — Ambulatory Visit (HOSPITAL_COMMUNITY): Payer: BLUE CROSS/BLUE SHIELD | Attending: Orthopedic Surgery | Admitting: Physical Therapy

## 2017-06-02 ENCOUNTER — Encounter (HOSPITAL_COMMUNITY): Payer: Self-pay | Admitting: Physical Therapy

## 2017-06-02 DIAGNOSIS — M25561 Pain in right knee: Secondary | ICD-10-CM | POA: Insufficient documentation

## 2017-06-02 DIAGNOSIS — R29898 Other symptoms and signs involving the musculoskeletal system: Secondary | ICD-10-CM | POA: Insufficient documentation

## 2017-06-02 DIAGNOSIS — R2689 Other abnormalities of gait and mobility: Secondary | ICD-10-CM

## 2017-06-02 DIAGNOSIS — M25661 Stiffness of right knee, not elsewhere classified: Secondary | ICD-10-CM

## 2017-06-02 NOTE — Therapy (Signed)
[place], [place], [address] Phone: [phone]   Fax:  [phone]  Physical Therapy Treatment  Patient Details  Name: Christopher [name] MRN: [id number] Date of Birth: [birth year] Referring Provider: [name], MD   Encounter Date: 06/02/2017  PT End of Session - 06/02/17 0911    Visit Number  32    Number of Visits  34    Date for PT Re-Evaluation  06/13/17 Reassessment complete on 05/13/17    Authorization Type  [employer]    Authorization Time Period  03/07/17-04/18/17; 04/18/17-05/13/17; New 05/16/17 - 06/13/17    PT Start Time  1638    PT Stop Time  0945    PT Time Calculation (min)  40 min    Activity Tolerance  Patient tolerated treatment well;No increased pain    Behavior During Therapy  WFL for tasks assessed/performed       Past Medical History:  Diagnosis Date  . Arthritis   . Atrial fibrillation ([place])    a. occuring in the post-operative setting in 02/2017  . HTN (hypertension)   . Hyperlipemia     Past Surgical History:  Procedure Laterality Date  . EXAM UNDER ANESTHESIA WITH MANIPULATION OF KNEE Right 04/05/2017   Procedure: EXAM UNDER ANESTHESIA WITH MANIPULATION OF KNEE;  Surgeon: Carole Civil, MD;  Location: [facility];  Service: Orthopedics;  Laterality: Right;  . HERNIA REPAIR Right   . KNEE ARTHROSCOPY  right knee  . TOTAL KNEE ARTHROPLASTY Right 02/15/2017   Procedure: TOTAL KNEE ARTHROPLASTY;  Surgeon: Carole Civil, MD;  Location: [facility];  Service: Orthopedics;  Laterality: Right;    There were no vitals filed for this visit.  Subjective Assessment - 06/02/17 0905    Subjective  Pt states that he is using his JAS 4 x a day.      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  No/denies    Pain Onset  More than a month ago                       [facility] Adult PT  Treatment/Exercise - 06/02/17 0001      Knee/Hip Exercises: Stretches   Passive Hamstring Stretch  Right;3 reps;30 seconds    Passive Hamstring Stretch Limitations  On 12 inch step; self mobilization for extension (AP glide on distal femur)    Quad Stretch  Right;2 reps;60 seconds    Knee: Self-Stretch to increase Flexion  10 seconds    Knee: Self-Stretch Limitations  10x 10" holds on 18" step    Gastroc Stretch  Both;3 reps;30 seconds    Gastroc Stretch Limitations  slant board    Other Knee/Hip Stretches  stand upright with Rt foot flexted on 18": x2'      Knee/Hip Exercises: Standing   Knee Flexion  Right;10 reps;Limitations    Knee Flexion Limitations  off 6in step height    Terminal Knee Extension  Right;10 reps      Knee/Hip Exercises: Seated   Other Seated Knee/Hip Exercises  stool scoot 2 RT       Knee/Hip Exercises: Supine   Quad Sets  AAROM;Strengthening;Right;10 reps    Knee Extension  PROM;3 sets    Knee Extension Limitations  9    Knee Flexion  PROM;3 sets;AROM    Knee Flexion Limitations  89      Knee/Hip Exercises: Prone   Contract/Relax to Increase Flexion  5x 10" contract and 30" relax/stretch    Other Prone Exercises  terminal knee flexion x 5     Other Prone Exercises  TKE 10x      Manual Therapy   Manual Therapy  Joint mobilization;Passive ROM    Joint Mobilization  AP/PA glide to right tbibiofemoral joint for flexion/extension, grade III for 3x 30-45 seconds anterior and posterior    Passive ROM  for both flexion and extension                PT Short Term Goals - 05/13/17 0934      PT SHORT TERM GOAL #1   Title   Patient to be compliant with correct performance of HEP, to be updated weekly      Time  2    Period  Weeks    Status  Achieved    Target Date  05/27/17      PT SHORT TERM GOAL #2   Title  Patient will improve ROM for Rt knee extension/flexion by to 5-95 degrees to improve functional mobility with gait and stair ambulation     Baseline  05/13/17: Right knee extension/flexion AROM 10-87 degrees    Time  2    Period  Weeks    Status  On-going    Target Date  05/27/17      PT SHORT TERM GOAL #3   Title  Patient will perform 5x sit to stand in less than or equal to 12 seconds to improve functional LE strength and decrease fall risk     Baseline  05/13/17: Patient performed 5x sit to stand in 13.46 seconds    Time  2    Period  Weeks    Status  On-going    Target Date  05/27/17        PT Long Term Goals - 05/13/17 0936      PT LONG TERM GOAL #1   Title  Patient will improve ROM for Rt knee extension/flexion by to 0-110 degrees to improve functional mobility with gait and stair ambulation    Baseline  05/13/17: Patient's Rt knee extension/flexion AROM is 10-87 degrees.    Time  4    Period  Weeks    Status  On-going    Target Date  06/10/17      PT LONG TERM GOAL #2   Title  Patient to show 5/5 MMT grade in all tested musculature in order to improve balance and performance during gait and stair ambulation     Baseline  05/13/17: Patient is still limited in right hip extension and right knee extension. Patient did improve to 5/5 strength with right knee flexion.     Time  4    Period  Weeks    Status  On-going    Target Date  06/10/17      PT LONG TERM GOAL #3   Title  Patient will be participating in regular fitness program for strength and balance training to be independent with maintaining functional mobility and reducing pain; and to return to PLOF with coaching.     Baseline  05/13/17: Patient reported he has not begun this yet.     Time  4    Period  Weeks    Status  On-going    Target Date  06/10/17      PT LONG TERM GOAL #4   Title  Patient will perform 5x sit to stand and TUG in less than or equal to 12 seconds with LRAD to improve functional LE strength and decrease fall risk     Baseline  05/13/17: Patient performed TUG in less than 12 seconds without cane, but performed 5x sit to stand in  greater than 12 seconds.     Time  4    Period  Weeks    Status  Partially Met    Target Date  06/10/17            Plan - 06/02/17 0911    Clinical Impression Statement  Added stool scoot,  and quadricep stretch .  PT continues to have significant restrictions which the JAS should help immensley.  Pt ROM currently     Rehab Potential  Good    Clinical Impairments Affecting Rehab Potential  (+) motivated, (-) chronic knee pain    PT Frequency  2x / week    PT Duration  4 weeks    PT Treatment/Interventions  ADLs/Self Care Home Management;Electrical Stimulation;Cryotherapy;DME Instruction;Gait training;Stair training;Functional mobility training;Therapeutic activities;Therapeutic exercise;Balance training;Neuromuscular re-education;Patient/family education;Manual techniques;Passive range of motion;Scar mobilization;Energy conservation;Taping    PT Next Visit Plan  Continue with [employer] for passive range of motion. Continue with manual techniques to improve mobility of tibfem/patellofem joint and manual therapy in lengthened position for quad and hamstring tendons due to myofascial restrictions. Continue with PNF, contract relax. Work on ambulation to reduce antalgia.      PT Home Exercise Plan  Eval: quad set, heel slide, assisted right knee flexion stretch; 2/8: SAQ, scar mobilization; 03/16/17 - TKE with green TB, low load long duration stretch; 03/23/17: Seated hamstring stretch 3 x 30 seconds 1x/day; 04/18/17 - contract relax stretch for quad/hamstring; 05/13/17: Prone hamstring curl 2x10 1x/day, gave patient blue theraband to progress TKE       Patient will benefit from skilled therapeutic intervention in order to improve the following deficits and impairments:  Abnormal gait, Decreased activity tolerance, Decreased endurance, Decreased knowledge of use of DME, Decreased range of motion, Decreased skin integrity, Decreased strength, Hypomobility, Increased fascial restricitons, Decreased  balance, Decreased mobility, Decreased scar mobility, Difficulty walking, Increased edema, Pain, Impaired flexibility, Postural dysfunction  Visit Diagnosis: Acute pain of right knee  Stiffness of right knee, not elsewhere classified  Other abnormalities of gait and mobility  Other symptoms and signs involving the musculoskeletal system     Problem List Patient Active Problem List   Diagnosis Date Noted  . Arthrofibrosis of knee joint, right s/p Manipulation 04/05/17 04/05/2017  . S/P total knee replacement, right 02/15/17 03/01/2017  . Primary osteoarthritis of both knees 10/13/2016  . Chest pain 06/06/2016  . Hyperlipemia 04/16/2013  . HTN (hypertension), benign 04/16/2013  . Impingement syndrome of right shoulder 03/08/2011  Rayetta Humphrey, PT CLT [phone] 06/02/2017, 9:47 AM  [facility] [address] [place], [place], [address] Phone: [phone]   Fax:  [phone]  Name: Christopher [name] MRN: [id number] Date of Birth: 1960/03/06

## 2017-06-07 ENCOUNTER — Ambulatory Visit (HOSPITAL_COMMUNITY): Payer: BLUE CROSS/BLUE SHIELD | Admitting: Physical Therapy

## 2017-06-07 ENCOUNTER — Encounter (HOSPITAL_COMMUNITY): Payer: Self-pay | Admitting: Physical Therapy

## 2017-06-07 DIAGNOSIS — M25561 Pain in right knee: Secondary | ICD-10-CM

## 2017-06-07 DIAGNOSIS — R29898 Other symptoms and signs involving the musculoskeletal system: Secondary | ICD-10-CM

## 2017-06-07 DIAGNOSIS — R2689 Other abnormalities of gait and mobility: Secondary | ICD-10-CM

## 2017-06-07 DIAGNOSIS — M25661 Stiffness of right knee, not elsewhere classified: Secondary | ICD-10-CM

## 2017-06-07 NOTE — Therapy (Signed)
[place], [place], [address] Phone: [phone]   Fax:  [phone]  Physical Therapy Treatment  Patient Details  Name: Christopher [name] MRN: [id number] Date of Birth: Jun 06, 1960 Referring Provider: [name], MD   Encounter Date: 06/07/2017  PT End of Session - 06/07/17 0826    Visit Number  33    Number of Visits  34    Date for PT Re-Evaluation  06/13/17 Reassessment complete on 05/13/17    Authorization Type  [employer]    Authorization Time Period  03/07/17-04/18/17; 04/18/17-05/13/17; New 05/16/17 - 06/13/17    PT Start Time  0815    PT Stop Time  0900    PT Time Calculation (min)  45 min    Activity Tolerance  Patient tolerated treatment well;No increased pain    Behavior During Therapy  WFL for tasks assessed/performed       Past Medical History:  Diagnosis Date  . Arthritis   . Atrial fibrillation ([place])    a. occuring in the post-operative setting in 02/2017  . HTN (hypertension)   . Hyperlipemia     Past Surgical History:  Procedure Laterality Date  . EXAM UNDER ANESTHESIA WITH MANIPULATION OF KNEE Right 04/05/2017   Procedure: EXAM UNDER ANESTHESIA WITH MANIPULATION OF KNEE;  Surgeon: Carole Civil, MD;  Location: [facility];  Service: Orthopedics;  Laterality: Right;  . HERNIA REPAIR Right   . KNEE ARTHROSCOPY  right knee  . TOTAL KNEE ARTHROPLASTY Right 02/15/2017   Procedure: TOTAL KNEE ARTHROPLASTY;  Surgeon: Carole Civil, MD;  Location: [facility];  Service: Orthopedics;  Laterality: Right;    There were no vitals filed for this visit.  Subjective Assessment - 06/07/17 0818    Subjective  Pt states that he was sore after his last session but it calmed down after 24hr.      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  Yes    Pain Score  1     Pain Location  Knee    Pain Orientation  Right    Pain Descriptors / Indicators  Sore    Pain Type  Acute pain    Pain Onset  More than a month ago    Pain Frequency  Intermittent    Aggravating Factors   stretching    Pain Relieving Factors  ice     Effect of Pain on Daily Activities  PT is doing about everything he wants to go                        [facility] Adult PT Treatment/Exercise - 06/07/17 0001      Knee/Hip Exercises: Stretches   Active Hamstring Stretch  Right;3 reps;30 seconds    Quad Stretch  Right;2 reps;60 seconds    Knee: Self-Stretch to increase Flexion  30 seconds    Knee: Self-Stretch Limitations  x5    Gastroc Stretch  Both;3 reps;30 seconds    Gastroc Stretch Limitations  slant board    Other Knee/Hip Stretches  stand upright with Rt foot flexted on 18": x2'      Knee/Hip Exercises: Standing   Knee Flexion  Right;10 reps;Limitations    Knee Flexion Limitations  off 6in step height    Terminal Knee Extension  Right;10 reps    Gait Training  heel to toe mechanics no AD 226 ft x 2      Knee/Hip Exercises: Seated   Other Seated Knee/Hip Exercises  stool scoot 2 RT       Knee/Hip Exercises: Supine   Quad Sets  AAROM;Strengthening;Right;10 reps    Knee Extension  PROM;3 sets    Knee Extension Limitations  9    Knee Flexion  PROM;3 sets;AROM    Knee Flexion Limitations  91      Knee/Hip Exercises: Prone   Hamstring Curl  5 reps    Contract/Relax to Increase Flexion  5x 10" contract and 30" relax/stretch    Other Prone Exercises  terminal knee flexion x 5     Other Prone Exercises  TKE 10x      Manual Therapy   Manual Therapy  Joint mobilization;Passive ROM    Joint Mobilization  AP/PA glide to right tbibiofemoral joint for flexion/extension, grade III for 3x 30-45 seconds anterior and posterior    Passive ROM  for both flexion and extension                PT Short Term Goals - 05/13/17 0934      PT SHORT TERM GOAL #1   Title   Patient to be compliant with correct performance of  HEP, to be updated weekly      Time  2    Period  Weeks    Status  Achieved    Target Date  05/27/17      PT SHORT TERM GOAL #2   Title  Patient will improve ROM for Rt knee extension/flexion by to 5-95 degrees to improve functional mobility with gait and stair ambulation    Baseline  05/13/17: Right knee extension/flexion AROM 10-87 degrees    Time  2    Period  Weeks    Status  On-going    Target Date  05/27/17      PT SHORT TERM GOAL #3   Title  Patient will perform 5x sit to stand in less than or equal to 12 seconds to improve functional LE strength and decrease fall risk     Baseline  05/13/17: Patient performed 5x sit to stand in 13.46 seconds    Time  2    Period  Weeks    Status  On-going    Target Date  05/27/17        PT Long Term Goals - 05/13/17 0936      PT LONG TERM GOAL #1   Title  Patient will improve ROM for Rt knee extension/flexion by to 0-110 degrees to improve functional mobility with gait and stair ambulation    Baseline  05/13/17: Patient's Rt knee extension/flexion AROM is 10-87 degrees.    Time  4    Period  Weeks    Status  On-going    Target Date  06/10/17      PT LONG TERM GOAL #2   Title  Patient to show 5/5 MMT grade in all tested musculature in order to improve balance and performance during gait and stair ambulation     Baseline  05/13/17: Patient is still limited in right hip extension and right knee extension. Patient did improve to 5/5 strength with right knee flexion.     Time  4    Period  Weeks    Status  On-going    Target Date  06/10/17      PT LONG TERM GOAL #3   Title  Patient  will be participating in regular fitness program for strength and balance training to be independent with maintaining functional mobility and reducing pain; and to return to PLOF with coaching.     Baseline  05/13/17: Patient reported he has not begun this yet.     Time  4    Period  Weeks    Status  On-going    Target Date  06/10/17      PT LONG TERM GOAL  #4   Title   Patient will perform 5x sit to stand and TUG in less than or equal to 12 seconds with LRAD to improve functional LE strength and decrease fall risk     Baseline  05/13/17: Patient performed TUG in less than 12 seconds without cane, but performed 5x sit to stand in greater than 12 seconds.     Time  4    Period  Weeks    Status  Partially Met    Target Date  06/10/17            Plan - 06/07/17 0827    Clinical Impression Statement  Continue to emphasis ROM only during treatment.  Pt only has one more treatment.  Emphasized to pt that he will need to wear JAZ 3x a day .     Rehab Potential  Good    Clinical Impairments Affecting Rehab Potential  (+) motivated, (-) chronic knee pain    PT Frequency  2x / week    PT Duration  4 weeks    PT Treatment/Interventions  ADLs/Self Care Home Management;Electrical Stimulation;Cryotherapy;DME Instruction;Gait training;Stair training;Functional mobility training;Therapeutic activities;Therapeutic exercise;Balance training;Neuromuscular re-education;Patient/family education;Manual techniques;Passive range of motion;Scar mobilization;Energy conservation;Taping    PT Next Visit Plan  complete foto as well as reassess for discharge.     PT Home Exercise Plan  Eval: quad set, heel slide, assisted right knee flexion stretch; 2/8: SAQ, scar mobilization; 03/16/17 - TKE with green TB, low load long duration stretch; 03/23/17: Seated hamstring stretch 3 x 30 seconds 1x/day; 04/18/17 - contract relax stretch for quad/hamstring; 05/13/17: Prone hamstring curl 2x10 1x/day, gave patient blue theraband to progress TKE       Patient will benefit from skilled therapeutic intervention in order to improve the following deficits and impairments:  Abnormal gait, Decreased activity tolerance, Decreased endurance, Decreased knowledge of use of DME, Decreased range of motion, Decreased skin integrity, Decreased strength, Hypomobility, Increased fascial restricitons,  Decreased balance, Decreased mobility, Decreased scar mobility, Difficulty walking, Increased edema, Pain, Impaired flexibility, Postural dysfunction  Visit Diagnosis: Acute pain of right knee  Stiffness of right knee, not elsewhere classified  Other abnormalities of gait and mobility  Other symptoms and signs involving the musculoskeletal system     Problem List Patient Active Problem List   Diagnosis Date Noted  . Arthrofibrosis of knee joint, right s/p Manipulation 04/05/17 04/05/2017  . S/P total knee replacement, right 02/15/17 03/01/2017  . Primary osteoarthritis of both knees 10/13/2016  . Chest pain 06/06/2016  . Hyperlipemia 04/16/2013  . HTN (hypertension), benign 04/16/2013  . Impingement syndrome of right shoulder 03/08/2011    Rayetta Humphrey, PT CLT [phone] 06/07/2017, 9:00 AM  [place], [place], [address] Phone: [phone]   Fax:  [phone]  Name: Christopher [name] MRN: [id number] Date of Birth: December 11, 1960

## 2017-06-10 ENCOUNTER — Ambulatory Visit (HOSPITAL_COMMUNITY): Payer: BLUE CROSS/BLUE SHIELD

## 2017-06-10 ENCOUNTER — Encounter (HOSPITAL_COMMUNITY): Payer: Self-pay

## 2017-06-10 DIAGNOSIS — M25561 Pain in right knee: Secondary | ICD-10-CM

## 2017-06-10 DIAGNOSIS — M25661 Stiffness of right knee, not elsewhere classified: Secondary | ICD-10-CM

## 2017-06-10 DIAGNOSIS — R29898 Other symptoms and signs involving the musculoskeletal system: Secondary | ICD-10-CM

## 2017-06-10 DIAGNOSIS — R2689 Other abnormalities of gait and mobility: Secondary | ICD-10-CM

## 2017-06-10 NOTE — Therapy (Signed)
[facility] [place], [place], [address] Phone: [phone]   Fax:  [phone]  Physical Therapy Treatment  Patient Details  Name: Christopher [name] MRN: [id number] Date of Birth: Mar 07, 1960 Referring Provider: [name], MD   Encounter Date: 06/10/2017  PT End of Session - 06/10/17 0856    Visit Number  34    Number of Visits  34    Date for PT Re-Evaluation  06/13/17 Reassessment complete on 05/13/17    Authorization Type  [employer]    Authorization Time Period  03/07/17-04/18/17; 04/18/17-05/13/17; New 05/16/17 - 06/13/17    PT Start Time  0857    PT Stop Time  8882    PT Time Calculation (min)  19 min    Activity Tolerance  Patient tolerated treatment well;No increased pain    Behavior During Therapy  WFL for tasks assessed/performed       Past Medical History:  Diagnosis Date  . Arthritis   . Atrial fibrillation ([place])    a. occuring in the post-operative setting in 02/2017  . HTN (hypertension)   . Hyperlipemia     Past Surgical History:  Procedure Laterality Date  . EXAM UNDER ANESTHESIA WITH MANIPULATION OF KNEE Right 04/05/2017   Procedure: EXAM UNDER ANESTHESIA WITH MANIPULATION OF KNEE;  Surgeon: Carole Civil, MD;  Location: [facility];  Service: Orthopedics;  Laterality: Right;  . HERNIA REPAIR Right   . KNEE ARTHROSCOPY  right knee  . TOTAL KNEE ARTHROPLASTY Right 02/15/2017   Procedure: TOTAL KNEE ARTHROPLASTY;  Surgeon: Carole Civil, MD;  Location: [facility];  Service: Orthopedics;  Laterality: Right;    There were no vitals filed for this visit.  Subjective Assessment - 06/10/17 0859    Subjective  Pt reports that he's feeling good. He denies any knee. He has been using his JAS brace at home.    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  No/denies    Pain Onset  More than a month ago         [facility]  PT Assessment - 06/10/17 0001      Assessment   Medical Diagnosis  Right TKA    Referring Provider  [name], MD    Onset Date/Surgical Date  02/15/17    Hand Dominance  Right    Next MD Visit  06/20/17    Prior Therapy  ~15 years ago for Right knee scope and HH for this sx ended this past saturday      Observation/Other Assessments   Focus on Therapeutic Outcomes (FOTO)   19% limited was 51% limited      AROM   Right Knee Extension  7 was 10    Right Knee Flexion  81 91 on 5/7; was 87 on 4/12      Strength   Right Hip Extension  5/5 was 4+    Right Knee Extension  5/5 was 4+           [facility] Adult PT Treatment/Exercise - 06/10/17 0001      Knee/Hip Exercises: Aerobic   Nustep  x8 mins, L3, seat 11, no UE, for knee ROM (unbilled)          PT Education - 06/10/17 0857    Education provided  Yes    Education Details  discharge plans  Person(s) Educated  Patient    Methods  Explanation    Comprehension  Verbalized understanding       PT Short Term Goals - 06/10/17 0859      PT SHORT TERM GOAL #1   Title   Patient to be compliant with correct performance of HEP, to be updated weekly      Time  2    Period  Weeks    Status  Achieved      PT SHORT TERM GOAL #2   Title  Patient will improve ROM for Rt knee extension/flexion by to 5-95 degrees to improve functional mobility with gait and stair ambulation    Baseline  5/10: 7-81, was 91deg 5/7    Time  2    Period  Weeks    Status  On-going      PT SHORT TERM GOAL #3   Title  Patient will perform 5x sit to stand in less than or equal to 12 seconds to improve functional LE strength and decrease fall risk     Baseline  5/10: 11.13 sec    Time  2    Period  Weeks    Status  Achieved        PT Long Term Goals - 06/10/17 0900      PT LONG TERM GOAL #1   Title  Patient will improve ROM for Rt knee extension/flexion by to 0-110 degrees to improve functional mobility with gait and stair ambulation     Baseline  5/10: 7-81, was 91deg 5/7    Time  4    Period  Weeks    Status  On-going      PT LONG TERM GOAL #2   Title  Patient to show 5/5 MMT grade in all tested musculature in order to improve balance and performance during gait and stair ambulation     Baseline  5/10: 5/5 throughout    Time  4    Period  Weeks    Status  Achieved      PT LONG TERM GOAL #3   Title  Patient will be participating in regular fitness program for strength and balance training to be independent with maintaining functional mobility and reducing pain; and to return to PLOF with coaching.     Baseline  5/10: has not started a regular fitness program but he has returned to coaching    Time  4    Period  Weeks    Status  Partially Met      PT LONG TERM GOAL #4   Title   Patient will perform 5x sit to stand and TUG in less than or equal to 12 seconds with LRAD to improve functional LE strength and decrease fall risk     Baseline  5/10: Patient performed TUG in less than 12 seconds without cane and 5xSTS in <12 sec     Time  4    Period  Weeks    Status  Achieved            Plan - 06/10/17 0920    Clinical Impression Statement  PT reassessed pt's goals and outcome measures this date. Pt has met or partially met all goals except for his STG and LTG for his ROM. His AROM was 7-81deg this date but he was able to achieve 91deg flexion last visit; PT feels this decrease in ROM is likely due to not performing full session of therex prior to measuring this date. Pt  reports not being limited at all with any of his functional tasks and he has returned to coaching. He has his JAS brace and reports compliance with using it at home. At this time, pt does not require any more skilled PT intervention. He was educated to continue the use of his JAS brace 3x/day in order to improve his ROM.     Rehab Potential  Good    Clinical Impairments Affecting Rehab Potential  (+) motivated, (-) chronic knee pain    PT Frequency  2x  / week    PT Duration  4 weeks    PT Treatment/Interventions  ADLs/Self Care Home Management;Electrical Stimulation;Cryotherapy;DME Instruction;Gait training;Stair training;Functional mobility training;Therapeutic activities;Therapeutic exercise;Balance training;Neuromuscular re-education;Patient/family education;Manual techniques;Passive range of motion;Scar mobilization;Energy conservation;Taping    PT Next Visit Plan  discharged    PT Home Exercise Plan  Eval: quad set, heel slide, assisted right knee flexion stretch; 2/8: SAQ, scar mobilization; 03/16/17 - TKE with green TB, low load long duration stretch; 03/23/17: Seated hamstring stretch 3 x 30 seconds 1x/day; 04/18/17 - contract relax stretch for quad/hamstring; 05/13/17: Prone hamstring curl 2x10 1x/day, gave patient blue theraband to progress TKE       Patient will benefit from skilled therapeutic intervention in order to improve the following deficits and impairments:  Abnormal gait, Decreased activity tolerance, Decreased endurance, Decreased knowledge of use of DME, Decreased range of motion, Decreased skin integrity, Decreased strength, Hypomobility, Increased fascial restricitons, Decreased balance, Decreased mobility, Decreased scar mobility, Difficulty walking, Increased edema, Pain, Impaired flexibility, Postural dysfunction  Visit Diagnosis: Acute pain of right knee  Stiffness of right knee, not elsewhere classified  Other abnormalities of gait and mobility  Other symptoms and signs involving the musculoskeletal system     Problem List Patient Active Problem List   Diagnosis Date Noted  . Arthrofibrosis of knee joint, right s/p Manipulation 04/05/17 04/05/2017  . S/P total knee replacement, right 02/15/17 03/01/2017  . Primary osteoarthritis of both knees 10/13/2016  . Chest pain 06/06/2016  . Hyperlipemia 04/16/2013  . HTN (hypertension), benign 04/16/2013  . Impingement syndrome of right shoulder 03/08/2011         Geraldine Solar PT, DPT  [facility] [address] [place], [place], [address] Phone: [phone]   Fax:  [phone]  Name: REYCE LUBECK MRN: [id number] Date of Birth: 1960/04/01

## 2017-06-15 ENCOUNTER — Other Ambulatory Visit: Payer: Self-pay | Admitting: Orthopedic Surgery

## 2017-06-15 MED ORDER — HYDROCODONE-ACETAMINOPHEN 5-325 MG PO TABS: 1.0000 | ORAL_TABLET | Freq: Four times a day (QID) | ORAL | 0 refills | Status: DC | PRN

## 2017-06-15 NOTE — Telephone Encounter (Signed)
Patient called for refill: HYDROcodone-acetaminophen (NORCO/VICODIN) 5-325 MG tablet 28 tablet HYDROcodone-acetaminophen (NORCO/VICODIN) 5-325 MG tablet 28 tablet -[employer], [address] [place]

## 2017-06-20 ENCOUNTER — Ambulatory Visit (INDEPENDENT_AMBULATORY_CARE_PROVIDER_SITE_OTHER): Payer: Self-pay | Admitting: Orthopedic Surgery

## 2017-06-20 ENCOUNTER — Encounter: Payer: Self-pay | Admitting: Orthopedic Surgery

## 2017-06-20 VITALS — BP 151/91 | HR 57 | Ht 71.0 in | Wt 185.0 lb

## 2017-06-20 DIAGNOSIS — Z96651 Presence of right artificial knee joint: Secondary | ICD-10-CM

## 2017-06-20 DIAGNOSIS — M24661 Ankylosis, right knee: Secondary | ICD-10-CM

## 2017-06-20 NOTE — Progress Notes (Signed)
Progress Note   Patient ID: Christopher Christopher [name], Christopher [name]   DOB: 15-Dec-1960, 57 y.o.   MRN: [id number]  Chief Complaint  Patient presents with  . Post-op Follow-up    knee right s/p TKR 02/15/17 and manipulation 04/15/17     Medical decision-making Encounter Diagnoses  Name Primary?  . S/P total knee replacement, right 02/15/17 Yes  . Arthrofibrosis of knee joint, right s/p Manipulation 3/Christopher/19      PLAN: Recommend continued just splint  [name] nowhere near where we would like him to be.  [name] about 3 degrees extension to 85 degrees flexion.  He has some night pain which makes me think he has some back problems that is leading to this failure to progress and recurrent stiffness    No orders of the defined types were placed in this encounter.    Chief Complaint  Patient presents with  . Post-op Follow-up    knee right s/p TKR 02/15/17 and manipulation 04/15/17    57 year old Christopher [name] had a total knee in January had a manipulation in March comes in with complaints of night pain    Review of Systems  Constitutional: Negative for fever.  Skin: Negative.  Negative for rash.   Current Meds  Medication Sig  . amLODipine (NORVASC) Christopher MG tablet Take 1 tablet (Christopher mg total) by mouth daily.  [name] aspirin EC 81 MG tablet Take 81 mg by mouth daily.  [name] atorvastatin (LIPITOR) 10 MG tablet TAKE 1 TABLET BY MOUTH EVERY DAY  . HYDROcodone-acetaminophen (NORCO/VICODIN) Christopher-325 MG tablet Take 1 tablet by mouth every 6 (six) hours as needed for moderate pain.  . methocarbamol (ROBAXIN) 500 MG tablet Take 1 tablet (500 mg total) by mouth every 6 (six) hours as needed for muscle spasms.    Allergies  Allergen Reactions  . Hydrocodone Itching     BP (!) 151/91   Pulse (!) 57   Ht Christopher\' 11"  (1.803 m)   Wt 185 lb (83.9 kg)   BMI 25.80 kg/m   Physical Exam  Musculoskeletal:       Right knee: He exhibits decreased range of motion. He exhibits no swelling, no deformity, no erythema and normal alignment.   Legs:    [name], MD  Christopher/20/2019 9:10 AM

## 2017-06-28 ENCOUNTER — Telehealth: Payer: Self-pay | Admitting: Orthopedic Surgery

## 2017-06-28 NOTE — Telephone Encounter (Signed)
Hydrocodone-Acetaminophen  5/325mg   Qty 28 Tablets  Take 1 tablet by mouth every 6 (six) hours as needed for moderate pain.[name]  PATIENT USES [facility] ON [address]

## 2017-06-29 ENCOUNTER — Other Ambulatory Visit: Payer: Self-pay | Admitting: Orthopedic Surgery

## 2017-06-29 DIAGNOSIS — M24661 Ankylosis, right knee: Secondary | ICD-10-CM

## 2017-06-29 DIAGNOSIS — Z96651 Presence of right artificial knee joint: Secondary | ICD-10-CM

## 2017-06-29 MED ORDER — HYDROCODONE-ACETAMINOPHEN 5-325 MG PO TABS: 1.0000 | ORAL_TABLET | Freq: Four times a day (QID) | ORAL | 0 refills | Status: DC | PRN

## 2017-07-01 ENCOUNTER — Other Ambulatory Visit: Payer: Self-pay | Admitting: Family Medicine

## 2017-07-02 NOTE — Telephone Encounter (Signed)
Please deny prescription needs office visit

## 2017-07-08 ENCOUNTER — Telehealth: Payer: Self-pay | Admitting: *Deleted

## 2017-07-08 MED ORDER — ATORVASTATIN CALCIUM 10 MG PO TABS: ORAL_TABLET | ORAL | 0 refills | Status: DC

## 2017-07-08 NOTE — Telephone Encounter (Signed)
Patient called and scheduled an appointment for 6/17, patient asked if his cholesterol medicine can be refilled to [facility] on [address]. Please advise

## 2017-07-08 NOTE — Telephone Encounter (Signed)
Sent refill into pharmacy, contacted patient and informed him to make sure he keeps appointment. Pt verbalized understanding.

## 2017-07-14 ENCOUNTER — Other Ambulatory Visit: Payer: Self-pay | Admitting: Orthopedic Surgery

## 2017-07-14 DIAGNOSIS — Z96651 Presence of right artificial knee joint: Secondary | ICD-10-CM

## 2017-07-14 DIAGNOSIS — M24661 Ankylosis, right knee: Secondary | ICD-10-CM

## 2017-07-14 NOTE — Telephone Encounter (Signed)
Patient called and requested refill on Hydrocodone/Acetaminophen 5325  Mgs.   Qty  28  Sig: Take 1 tablet by mouth every 6 (six) hours as needed for moderate pain.  Patient states he uses [facility] on [address]

## 2017-07-15 MED ORDER — HYDROCODONE-ACETAMINOPHEN 5-325 MG PO TABS: 1.0000 | ORAL_TABLET | Freq: Four times a day (QID) | ORAL | 0 refills | Status: DC | PRN

## 2017-07-18 ENCOUNTER — Ambulatory Visit: Payer: BLUE CROSS/BLUE SHIELD | Admitting: Orthopedic Surgery

## 2017-07-18 ENCOUNTER — Ambulatory Visit: Payer: Self-pay | Admitting: Nurse Practitioner

## 2017-07-20 ENCOUNTER — Ambulatory Visit: Payer: Medicaid Other | Admitting: Orthopedic Surgery

## 2017-08-01 ENCOUNTER — Other Ambulatory Visit: Payer: Self-pay | Admitting: Orthopedic Surgery

## 2017-08-01 DIAGNOSIS — Z96651 Presence of right artificial knee joint: Secondary | ICD-10-CM

## 2017-08-01 DIAGNOSIS — M24661 Ankylosis, right knee: Secondary | ICD-10-CM

## 2017-08-01 MED ORDER — HYDROCODONE-ACETAMINOPHEN 5-325 MG PO TABS: 1.0000 | ORAL_TABLET | Freq: Four times a day (QID) | ORAL | 0 refills | Status: DC | PRN

## 2017-08-01 NOTE — Telephone Encounter (Signed)
Hydrocodone-Acetaminophen  5/325 MG  Qty  28 Tablets   Take 1 tablet by mouth every 6 (six0) hours as needed for moderate pain.  PATIENT USES  [facility] [address]

## 2017-08-08 ENCOUNTER — Ambulatory Visit: Payer: Medicaid Other | Admitting: Orthopedic Surgery

## 2017-08-10 ENCOUNTER — Encounter: Payer: Self-pay | Admitting: Family Medicine

## 2017-08-15 ENCOUNTER — Telehealth: Payer: Self-pay | Admitting: Family Medicine

## 2017-08-15 ENCOUNTER — Ambulatory Visit: Payer: Medicaid Other | Admitting: Orthopedic Surgery

## 2017-08-15 MED ORDER — METOPROLOL TARTRATE 25 MG PO TABS: 25.0000 mg | ORAL_TABLET | Freq: Two times a day (BID) | ORAL | 0 refills | Status: DC

## 2017-08-15 NOTE — Telephone Encounter (Signed)
Prescription sent electronically to pharmacy.  Patient verbalized understanding.

## 2017-08-15 NOTE — Telephone Encounter (Signed)
Pt need BP med refilled   Has appt here 08/24/17  metoprolol tartrate (LOPRESSOR) 25 MG tablet  Pt's completely out of med since Friday   [facility] - [address]/[place]   Please advise & call pt

## 2017-08-15 NOTE — Telephone Encounter (Signed)
He may have a 30-day supply 1 taken twice daily keep follow-up office visit

## 2017-08-15 NOTE — Telephone Encounter (Signed)
Last refill was not by our office

## 2017-08-16 ENCOUNTER — Encounter: Payer: Self-pay | Admitting: Orthopedic Surgery

## 2017-08-24 ENCOUNTER — Encounter: Payer: Self-pay | Admitting: Family Medicine

## 2017-08-24 ENCOUNTER — Ambulatory Visit (INDEPENDENT_AMBULATORY_CARE_PROVIDER_SITE_OTHER): Payer: Self-pay | Admitting: Family Medicine

## 2017-08-24 VITALS — BP 150/98 | Ht 71.5 in | Wt 190.0 lb

## 2017-08-24 DIAGNOSIS — I10 Essential (primary) hypertension: Secondary | ICD-10-CM

## 2017-08-24 DIAGNOSIS — Z125 Encounter for screening for malignant neoplasm of prostate: Secondary | ICD-10-CM

## 2017-08-24 DIAGNOSIS — Z1211 Encounter for screening for malignant neoplasm of colon: Secondary | ICD-10-CM

## 2017-08-24 DIAGNOSIS — E785 Hyperlipidemia, unspecified: Secondary | ICD-10-CM

## 2017-08-24 DIAGNOSIS — Z79899 Other long term (current) drug therapy: Secondary | ICD-10-CM

## 2017-08-24 MED ORDER — AMLODIPINE BESYLATE 5 MG PO TABS: 5.0000 mg | ORAL_TABLET | Freq: Every day | ORAL | 5 refills | Status: DC

## 2017-08-24 MED ORDER — METOPROLOL TARTRATE 25 MG PO TABS: ORAL_TABLET | ORAL | 5 refills | Status: DC

## 2017-08-24 MED ORDER — ATORVASTATIN CALCIUM 10 MG PO TABS: ORAL_TABLET | ORAL | 5 refills | Status: DC

## 2017-08-24 NOTE — Progress Notes (Signed)
Subjective:    Patient ID: Christopher [name], male    DOB: 1960-03-22, 57 y.o.   MRN: [id number]  HPI Patient is here today to follow up on Chronic health issues.He eats healthy, and exercises.   Patient for blood pressure check up.  The patient does have hypertension.  The patient is on medication.  Patient relates compliance with meds. Todays BP reviewed with the patient. Patient denies issues with medication. Patient relates reasonable diet. Patient tries to minimize salt. Patient aware of BP goals.  Patient here for follow-up regarding cholesterol.  The patient does have hyperlipidemia.  Patient does try to maintain a reasonable diet.  Patient does take the medication on a regular basis.  Denies missing a dose.  The patient denies any obvious side effects.  Prior blood work results reviewed with the patient.  The patient is aware of his cholesterol goals and the need to keep it under good control to lessen the risk of disease.   He sees Dr. Kenton Kingfisher for his right knee replacement.  Patient does relate that he has intermittent knee pain and discomfort limits his walking ability he does try to eat healthy does not smoke or drink  Review of Systems  Constitutional: Negative for diaphoresis and fatigue.  HENT: Negative for congestion and rhinorrhea.   Respiratory: Negative for cough and shortness of breath.   Cardiovascular: Negative for chest pain and leg swelling.  Gastrointestinal: Negative for abdominal pain and diarrhea.  Skin: Negative for color change and rash.  Neurological: Negative for dizziness and headaches.  Psychiatric/Behavioral: Negative for behavioral problems and confusion.       Objective:   Physical Exam  Constitutional: He appears well-nourished. No distress.  HENT:  Head: Normocephalic and atraumatic.  Eyes: Right eye exhibits no discharge. Left eye exhibits no discharge.  Neck: No tracheal deviation present.  Cardiovascular: Normal rate, regular rhythm and normal  heart sounds.  No murmur heard. Pulmonary/Chest: Effort normal and breath sounds normal. No respiratory distress.  Musculoskeletal: He exhibits no edema.  Lymphadenopathy:    He has no cervical adenopathy.  Neurological: [name] alert. Coordination normal.  Skin: Skin is warm and dry.  Psychiatric: He has a normal mood and affect. His behavior is normal.  Vitals reviewed.         Assessment & Plan:  HTN- Patient was seen today as part of a visit regarding hypertension. The importance of healthy diet and regular physical activity was discussed. The importance of compliance with medications discussed.  Ideal goal is to keep blood pressure low elevated levels certainly below 500/93 when possible.  The patient was counseled that keeping blood pressure under control lessen his risk of complications.  The importance of regular follow-ups was discussed with the patient.  Low-salt diet such as DASH recommended.  Regular physical activity was recommended as well.  Patient was advised to keep regular follow-ups.  Aspirin was stopped because the benefit was minimal and the risk of GI bleed elevated  The patient was seen today as part of an evaluation regarding hyperlipidemia.  Recent lab work has been reviewed with the patient as well as the goals for good cholesterol care.  In addition to this medications have been discussed the importance of compliance with diet and medications discussed as well.  Finally the patient is aware that poor control of cholesterol, noncompliance can dramatically increase the risk of complications. The patient will keep regular office visits and the patient does agreed to periodic lab work.  Patient referred for colonoscopy Lab work ordered Prostate exam deferred to next visit Wellness exam and medical checkup in 6 months  Overall I believe the patient is doing well he should do his lab work and do follow-up 6 months

## 2017-08-29 ENCOUNTER — Encounter (INDEPENDENT_AMBULATORY_CARE_PROVIDER_SITE_OTHER): Payer: Self-pay | Admitting: *Deleted

## 2017-08-29 ENCOUNTER — Encounter: Payer: Self-pay | Admitting: Family Medicine

## 2017-09-07 ENCOUNTER — Other Ambulatory Visit: Payer: Self-pay | Admitting: Orthopedic Surgery

## 2017-09-07 ENCOUNTER — Other Ambulatory Visit: Payer: Self-pay | Admitting: Family Medicine

## 2017-09-07 DIAGNOSIS — M24661 Ankylosis, right knee: Secondary | ICD-10-CM

## 2017-09-07 DIAGNOSIS — Z96651 Presence of right artificial knee joint: Secondary | ICD-10-CM

## 2017-09-07 MED ORDER — HYDROCODONE-ACETAMINOPHEN 5-325 MG PO TABS: 1.0000 | ORAL_TABLET | Freq: Four times a day (QID) | ORAL | 0 refills | Status: DC | PRN

## 2017-09-07 NOTE — Telephone Encounter (Signed)
Patient requests refill on Hydrocodone/Acetaminophen 5-325  Mgs.   Qty  28  Sig: Take 1 tablet by mouth every 6 (six) hours as needed for moderate pain.  Patient states he uses [facility] on [address]  He also states that he is having a lot of trouble with his insurance and that is the reason he has not been back in the office lately

## 2017-09-23 ENCOUNTER — Ambulatory Visit: Payer: Self-pay | Admitting: Cardiology

## 2017-09-23 NOTE — Progress Notes (Deleted)
Clinical Summary Christopher [name] is a 57 y.o.male  seen today for follow up of the following medical problems.   1. Chest pain  - previous ER visit 04/2013 with chest pain, thought to be non-cardiac  - Trop neg, EKG with NSR and no acute changes. CXR no acute process  CAD: HTN, HL, father MI 65s.  - denies any recent symptoms since our last visit - he completed a GXT that was negative for ischemia, with excellent functional capacity. Echo did not show any significant pathology.   2. Numbness right side  - describes epsiode of right arm weakness, occurred at grocery while carrying basket in right arm. Lasted for just a few minutes. No LE weakness, no facial droop or slurred speech. Lasted approx < 1 min.   3. Afib - episode after knee surgery Jan 2019 - He was not started on long-term anticoagulation given his CHA2DS2-VASc Score of 1 (HTN) and his arrhythmia occurring in the post-operative setting.   4. HTN     Past Medical History:  Diagnosis Date  . Arthritis   . Atrial fibrillation ([place])    a. occuring in the post-operative setting in 02/2017  . HTN (hypertension)   . Hyperlipemia      Allergies  Allergen Reactions  . Hydrocodone Itching     Current Outpatient Medications  Medication Sig Dispense Refill  . amLODipine (NORVASC) 5 MG tablet Take 1 tablet (5 mg total) by mouth daily. 30 tablet 5  . atorvastatin (LIPITOR) 10 MG tablet TAKE 1 TABLET BY MOUTH ONCE DAILY 30 tablet 1  . HYDROcodone-acetaminophen (NORCO/VICODIN) 5-325 MG tablet Take 1 tablet by mouth every 6 (six) hours as needed for moderate pain. 28 tablet 0  . methocarbamol (ROBAXIN) 500 MG tablet Take 1 tablet (500 mg total) by mouth every 6 (six) hours as needed for muscle spasms. 30 tablet 3  . metoprolol tartrate (LOPRESSOR) 25 MG tablet 1 twice daily 60 tablet 5   No current facility-administered medications for this visit.      Past Surgical History:  Procedure Laterality Date  . EXAM UNDER  ANESTHESIA WITH MANIPULATION OF KNEE Right 04/05/2017   Procedure: EXAM UNDER ANESTHESIA WITH MANIPULATION OF KNEE;  Surgeon: Carole Civil, MD;  Location: [facility];  Service: Orthopedics;  Laterality: Right;  . HERNIA REPAIR Right   . KNEE ARTHROSCOPY  right knee  . TOTAL KNEE ARTHROPLASTY Right 02/15/2017   Procedure: TOTAL KNEE ARTHROPLASTY;  Surgeon: Carole Civil, MD;  Location: [facility];  Service: Orthopedics;  Laterality: Right;     Allergies  Allergen Reactions  . Hydrocodone Itching      Family History  Problem Relation Age of Onset  . Arthritis Unknown   . Cancer Unknown   . Diabetes Unknown   . Other Mother 33       sepsis  . CAD Father 24  . CAD Brother 56  . Diabetes Brother      Social History Christopher [name] reports that he has never smoked. He has never used smokeless tobacco. Christopher [name] reports that he drinks alcohol.   Review of Systems CONSTITUTIONAL: No weight loss, fever, chills, weakness or fatigue.  HEENT: Eyes: No visual loss, blurred vision, double vision or yellow sclerae.No hearing loss, sneezing, congestion, runny nose or sore throat.  SKIN: No rash or itching.  CARDIOVASCULAR:  RESPIRATORY: No shortness of breath, cough or sputum.  GASTROINTESTINAL: No anorexia, nausea, vomiting or diarrhea. No abdominal pain or blood.  GENITOURINARY: No burning on urination, no polyuria NEUROLOGICAL: No headache, dizziness, syncope, paralysis, ataxia, numbness or tingling in the extremities. No change in bowel or bladder control.  MUSCULOSKELETAL: No muscle, back pain, joint pain or stiffness.  LYMPHATICS: No enlarged nodes. No history of splenectomy.  PSYCHIATRIC: No history of depression or anxiety.  ENDOCRINOLOGIC: No reports of sweating, cold or heat intolerance. No polyuria or polydipsia.  [name]   Physical Examination There were no vitals filed for this visit. There were no vitals filed for this visit.  Gen: resting comfortably, no acute  distress HEENT: no scleral icterus, pupils equal round and reactive, no palptable cervical adenopathy,  CV Resp: Clear to auscultation bilaterally GI: abdomen is soft, non-tender, non-distended, normal bowel sounds, no hepatosplenomegaly MSK: extremities are warm, no edema.  Skin: warm, no rash Neuro:  no focal deficits Psych: appropriate affect   Diagnostic Studies 05/23/13 GXT Attending Note  Patient exercised according to the Bruce protocol for 12 minutes achieving a workload of 13.4 METs. Heart increased from 47 bpm up to 150 bpm (88% of THR). Blood pressure increased from 146/80 up to 220/85. The test was stopped due to fatigue, the patient did not experience any chest pain.  Baseline EKG showed NSR. There were no ischemic EKG changes and no significant arrhythmias during stress.  Conclusions  1. Negative exercise stress test for ischemia  2. Duke treadmill score of 12, consistent with low risk for major cardiac events  3. Excellent exercise functional capacity (140% of predicted based on age and gender)  05/2013 Carotid US IMPRESSION: 1. Extensive bilateral intimal thickening but no focal plaque accumulation or stenosis.  05/2013 Echo Study Conclusions  - Left ventricle: The cavity size was normal. Wall thickness was increased in a pattern of mild LVH. Systolic function was normal. The estimated ejection fraction was in the range of 55% to 60%. Wall motion was normal; there were no regional wall motion abnormalities. Left ventricular diastolic function parameters were normal. - Mitral valve: Trivial regurgitation. - Right ventricle: The cavity size was mildly dilated. Systolic function was normal. - Right atrium: The atrium was mildly dilated. Central venous pressure: 56mm Hg (est). - Atrial septum: No defect or patent foramen ovale was identified. - Tricuspid valve: Trivial regurgitation. - Pulmonic valve: Mild regurgitation. - Pulmonary arteries: PA peak pressure:  29mm Hg (S). - Pericardium, extracardiac: There was no pericardial effusion. Impressions:  - Normal LV chanber size with mild LVH and LVEF 55-60%. Normal diastolic function. Mildly dilated RV with normal contraction, mild right atrial enlargement. No obvious PFO or ASD. Normal PASP 29 mmHg. Transthoracic echocardiography. M-mode, complete    06/2016 cardiac CT  FINDINGS: Non-cardiac: See separate report from [facility]. No significant findings on limited lung and soft tissue windows.  Calcium Score:  0  Coronary Arteries: Right dominant with no anomalies  LM:  Normal  LAD:  Normal  D1: Normal  D2: Normal  Circumflex: Normal  OM1:  Normal  AV groove normal  RCA:  Normal  PDA: Normal  PLA:  Normal  IMPRESSION: Normal right dominant coronary arteries  Calcium Score of 0  Normal aortic root 2.8 cm  No proximal pulmonary emboli   Jan 2019 echo Study Conclusions  - Left ventricle: The cavity size was normal. Wall thickness was   increased in a pattern of mild LVH. Systolic function was normal.   The estimated ejection fraction was in the range of 60% to 65%.   Wall motion was normal; there were  no regional wall motion   abnormalities. Left ventricular diastolic function parameters   were normal. - Pulmonic valve: There was mild regurgitation.   Assessment and Plan  1. Chest pain  - no recurrent symptoms - GXT negative for ischemia, with low risk Duke score and excellent functional capacity - continue risk factor modificaiton  2. Right sided numbness  - refer to neuro  - start ASA 81mg  daily   3. Carotid bruit  - no significant disease on recent US  F/u 1 year      Arnoldo Lenis, M.D., F.A.C.C.

## 2017-09-26 ENCOUNTER — Encounter: Payer: Self-pay | Admitting: Cardiology

## 2017-10-13 ENCOUNTER — Encounter (HOSPITAL_COMMUNITY): Payer: Self-pay

## 2017-10-13 NOTE — Therapy (Signed)
[place], [place], [address] Phone: [phone]   Fax:  [phone]  Patient Details  Name: Christopher [name] MRN: [id number] Date of Birth: 12-09-1960 Referring Provider:  No ref. provider found  Encounter Date: 10/13/2017  PHYSICAL THERAPY DISCHARGE SUMMARY  Visits from Start of Care: 34  Current functional level related to goals / functional outcomes: See last note   Remaining deficits: See last note   Education / Equipment: n/a  Plan: Patient agrees to discharge.  Patient goals were partially met. Patient is being discharged due to being pleased with the current functional level.  ?????      Geraldine Solar PT, [facility] [address] [place], [place], [address] Phone: [phone]   Fax:  [phone]

## 2017-11-09 ENCOUNTER — Ambulatory Visit (INDEPENDENT_AMBULATORY_CARE_PROVIDER_SITE_OTHER): Payer: BLUE CROSS/BLUE SHIELD | Admitting: Family Medicine

## 2017-11-09 ENCOUNTER — Encounter: Payer: Self-pay | Admitting: Orthopedic Surgery

## 2017-11-09 ENCOUNTER — Ambulatory Visit (INDEPENDENT_AMBULATORY_CARE_PROVIDER_SITE_OTHER): Payer: BLUE CROSS/BLUE SHIELD | Admitting: Orthopedic Surgery

## 2017-11-09 ENCOUNTER — Encounter: Payer: Self-pay | Admitting: Family Medicine

## 2017-11-09 VITALS — BP 158/91 | HR 84 | Ht 71.5 in | Wt 190.0 lb

## 2017-11-09 VITALS — BP 138/80 | Temp 98.5°F | Ht 71.5 in | Wt 193.0 lb

## 2017-11-09 DIAGNOSIS — K2100 Gastro-esophageal reflux disease with esophagitis, without bleeding: Secondary | ICD-10-CM

## 2017-11-09 DIAGNOSIS — Z96651 Presence of right artificial knee joint: Secondary | ICD-10-CM

## 2017-11-09 DIAGNOSIS — R109 Unspecified abdominal pain: Secondary | ICD-10-CM | POA: Diagnosis not present

## 2017-11-09 MED ORDER — PANTOPRAZOLE SODIUM 40 MG PO TBEC: 40.0000 mg | DELAYED_RELEASE_TABLET | Freq: Every day | ORAL | 3 refills | Status: DC

## 2017-11-09 NOTE — Progress Notes (Signed)
   Subjective:    Patient ID: Christopher [name], male    DOB: 07-07-1960, 57 y.o.   MRN: [id number]  Sinusitis  This is a new problem. Episode onset: 2 weeks. Associated symptoms include congestion and coughing. Pertinent negatives include no chills or ear pain. (Throat burning, headaches, congestion) Past treatments include nothing.  gets a little out of breath No fevers'  Sometimes dizzy  Eating ok Cough two weeks ago off and on  Started as phlegm Having coughing Some chest congestion He describes epigastric discomfort with regurgitation and reflux symptoms into the chest also describes into the back of the throat denies any wheezing or difficulty breathing   Review of Systems  Constitutional: Negative for activity change, chills and fever.  HENT: Positive for congestion and rhinorrhea. Negative for ear pain.   Eyes: Negative for discharge.  Respiratory: Positive for cough. Negative for wheezing.   Cardiovascular: Negative for chest pain.  Gastrointestinal: Negative for nausea and vomiting.  Musculoskeletal: Negative for arthralgias.       Objective:   Physical Exam  Constitutional: He appears well-developed.  HENT:  Head: Normocephalic.  Mouth/Throat: Oropharynx is clear and moist. No oropharyngeal exudate.  Neck: Normal range of motion.  Cardiovascular: Normal rate, regular rhythm and normal heart sounds.  No murmur heard. Pulmonary/Chest: Effort normal and breath sounds normal. He has no wheezes.  Lymphadenopathy:    He has no cervical adenopathy.  Neurological: He exhibits normal muscle tone.  Skin: Skin is warm and dry.  Nursing note and vitals reviewed.  Patient will also do lab work that was ordered back in July       [place]:  I believe what is going on with the patient is reflux with esophagitis and laryngeal discomfort from acid No need to do any type of x-rays  Will go ahead and order lab work Start Protonix Follow-up again in several  weeks Patient will do lab work he will notify us if getting worse I do not feel this is cardiac

## 2017-11-09 NOTE — Patient Instructions (Signed)
Gastroesophageal Reflux Disease, Adult Normally, food travels down the esophagus and stays in the stomach to be digested. If a person has gastroesophageal reflux disease (GERD), food and stomach acid move back up into the esophagus. When this happens, the esophagus becomes sore and swollen (inflamed). Over time, GERD can make small holes (ulcers) in the lining of the esophagus. Follow these instructions at home: Diet  Follow a diet as told by your doctor. You may need to avoid foods and drinks such as: ? Coffee and tea (with or without caffeine). ? Drinks that contain alcohol. ? Energy drinks and sports drinks. ? Carbonated drinks or sodas. ? Chocolate and cocoa. ? Peppermint and mint flavorings. ? Garlic and onions. ? Horseradish. ? Spicy and acidic foods, such as peppers, chili powder, curry powder, vinegar, hot sauces, and BBQ sauce. ? Citrus fruit juices and citrus fruits, such as oranges, lemons, and limes. ? Tomato-based foods, such as red sauce, chili, salsa, and pizza with red sauce. ? Fried and fatty foods, such as donuts, french fries, potato chips, and high-fat dressings. ? High-fat meats, such as hot dogs, rib eye steak, sausage, ham, and bacon. ? High-fat dairy items, such as whole milk, butter, and cream cheese.  Eat small meals often. Avoid eating large meals.  Avoid drinking large amounts of liquid with your meals.  Avoid eating meals during the 2-3 hours before bedtime.  Avoid lying down right after you eat.  Do not exercise right after you eat. General instructions  Pay attention to any changes in your symptoms.  Take over-the-counter and prescription medicines only as told by your doctor. Do not take aspirin, ibuprofen, or other NSAIDs unless your doctor says it is okay.  Do not use any tobacco products, including cigarettes, chewing tobacco, and e-cigarettes. If you need help quitting, ask your doctor.  Wear loose clothes. Do not wear anything tight around  your waist.  Raise (elevate) the head of your bed about 6 inches (15 cm).  Try to lower your stress. If you need help doing this, ask your doctor.  If you are overweight, lose an amount of weight that is healthy for you. Ask your doctor about a safe weight loss goal.  Keep all follow-up visits as told by your doctor. This is important. Contact a doctor if:  You have new symptoms.  You lose weight and you do not know why it is happening.  You have trouble swallowing, or it hurts to swallow.  You have wheezing or a cough that keeps happening.  Your symptoms do not get better with treatment.  You have a hoarse voice. Get help right away if:  You have pain in your arms, neck, jaw, teeth, or back.  You feel sweaty, dizzy, or light-headed.  You have chest pain or shortness of breath.  You throw up (vomit) and your throw up looks like blood or coffee grounds.  You pass out (faint).  Your poop (stool) is bloody or black.  You cannot swallow, drink, or eat. This information is not intended to replace advice given to you by your health care provider. Make sure you discuss any questions you have with your health care provider. Document Released: 07/07/2007 Document Revised: 06/26/2015 Document Reviewed: 05/15/2014 Elsevier Interactive Patient Education  2018 [employer]   Food Choices for Gastroesophageal Reflux Disease, Adult When you have gastroesophageal reflux disease (GERD), the foods you eat and your eating habits are very important. Choosing the right foods can help ease your discomfort. What   guidelines do I need to follow?  Choose fruits, vegetables, whole grains, and low-fat dairy products.  Choose low-fat meat, fish, and poultry.  Limit fats such as oils, salad dressings, butter, nuts, and avocado.  Keep a food diary. This helps you identify foods that cause symptoms.  Avoid foods that cause symptoms. These may be different for everyone.  Eat small meals  often instead of 3 large meals a day.  Eat your meals slowly, in a place where you are relaxed.  Limit fried foods.  Cook foods using methods other than frying.  Avoid drinking alcohol.  Avoid drinking large amounts of liquids with your meals.  Avoid bending over or lying down until 2-3 hours after eating. What foods are not recommended? These are some foods and drinks that may make your symptoms worse: Vegetables Tomatoes. Tomato juice. Tomato and spaghetti sauce. Chili peppers. Onion and garlic. Horseradish. Fruits Oranges, grapefruit, and lemon (fruit and juice). Meats High-fat meats, fish, and poultry. This includes hot dogs, ribs, ham, sausage, salami, and bacon. Dairy Whole milk and chocolate milk. Sour cream. Cream. Butter. Ice cream. Cream cheese. Drinks Coffee and tea. Bubbly (carbonated) drinks or energy drinks. Condiments Hot sauce. Barbecue sauce. Sweets/Desserts Chocolate and cocoa. Donuts. Peppermint and spearmint. Fats and Oils High-fat foods. This includes French fries and potato chips. Other Vinegar. Strong spices. This includes black pepper, white pepper, red pepper, cayenne, curry powder, cloves, ginger, and chili powder. The items listed above may not be a complete list of foods and drinks to avoid. Contact your dietitian for more information. This information is not intended to replace advice given to you by your health care provider. Make sure you discuss any questions you have with your health care provider. Document Released: 07/20/2011 Document Revised: 06/26/2015 Document Reviewed: 11/22/2012 Elsevier Interactive Patient Education  2017 Elsevier Inc.  

## 2017-11-09 NOTE — Progress Notes (Signed)
Chief Complaint  Patient presents with  . 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  If the labs come back normal it essentially rules out infection  I would probably x-ray as well  And then if no infection is looking at revision or live with what he is got.  He would like some pain medication requested an opioid we have referred him to pain management.

## 2017-11-10 ENCOUNTER — Encounter: Payer: Self-pay | Admitting: Cardiology

## 2017-11-10 ENCOUNTER — Encounter: Payer: Self-pay | Admitting: Family Medicine

## 2017-11-10 ENCOUNTER — Ambulatory Visit: Payer: BLUE CROSS/BLUE SHIELD | Admitting: Cardiology

## 2017-11-10 VITALS — BP 120/74 | HR 70 | Ht 71.0 in | Wt 193.0 lb

## 2017-11-10 DIAGNOSIS — R002 Palpitations: Secondary | ICD-10-CM | POA: Diagnosis not present

## 2017-11-10 DIAGNOSIS — I4891 Unspecified atrial fibrillation: Secondary | ICD-10-CM

## 2017-11-10 DIAGNOSIS — R0789 Other chest pain: Secondary | ICD-10-CM

## 2017-11-10 LAB — HEPATIC FUNCTION PANEL
ALK PHOS: 123 IU/L — AB (ref 39–117)
ALT: 14 IU/L (ref 0–44)
AST: 14 IU/L (ref 0–40)
Albumin: 4.8 g/dL (ref 3.5–5.5)
Bilirubin Total: 0.7 mg/dL (ref 0.0–1.2)
Bilirubin, Direct: 0.15 mg/dL (ref 0.00–0.40)
Total Protein: 7.7 g/dL (ref 6.0–8.5)

## 2017-11-10 LAB — BASIC METABOLIC PANEL
BUN / CREAT RATIO: 16 (ref 9–20)
BUN: 17 mg/dL (ref 6–24)
CALCIUM: 9.6 mg/dL (ref 8.7–10.2)
CO2: 27 mmol/L (ref 20–29)
CREATININE: 1.08 mg/dL (ref 0.76–1.27)
Chloride: 103 mmol/L (ref 96–106)
GFR calc non Af Amer: 76 mL/min/{1.73_m2} (ref >59)
GFR, EST AFRICAN AMERICAN: 88 mL/min/{1.73_m2} (ref >59)
Glucose: 98 mg/dL (ref 65–99)
Potassium: 4.2 mmol/L (ref 3.5–5.2)
Sodium: 144 mmol/L (ref 134–144)

## 2017-11-10 LAB — CBC WITH DIFFERENTIAL/PLATELET
BASOS: 1 %
Basophils Absolute: 0 10*3/uL (ref 0.0–0.2)
EOS (ABSOLUTE): 0.1 10*3/uL (ref 0.0–0.4)
EOS: 2 %
HEMATOCRIT: 45.5 % (ref 37.5–51.0)
Hemoglobin: 15.8 g/dL (ref 13.0–17.7)
IMMATURE GRANULOCYTES: 0 %
Immature Grans (Abs): 0 10*3/uL (ref 0.0–0.1)
LYMPHS: 29 %
Lymphocytes Absolute: 1.2 10*3/uL (ref 0.7–3.1)
MCH: 30.5 pg (ref 26.6–33.0)
MCHC: 34.7 g/dL (ref 31.5–35.7)
MCV: 88 fL (ref 79–97)
MONOCYTES: 11 %
Monocytes Absolute: 0.5 10*3/uL (ref 0.1–0.9)
NEUTROS PCT: 57 %
Neutrophils Absolute: 2.5 10*3/uL (ref 1.4–7.0)
Platelets: 228 10*3/uL (ref 150–450)
RBC: 5.18 x10E6/uL (ref 4.14–5.80)
RDW: 13 % (ref 12.3–15.4)
WBC: 4.3 10*3/uL (ref 3.4–10.8)

## 2017-11-10 LAB — LIPID PANEL
Chol/HDL Ratio: 3.8 ratio (ref 0.0–5.0)
Cholesterol, Total: 207 mg/dL — ABNORMAL HIGH (ref 100–199)
HDL: 54 mg/dL (ref >39)
LDL Calculated: 133 mg/dL — ABNORMAL HIGH (ref 0–99)
TRIGLYCERIDES: 99 mg/dL (ref 0–149)
VLDL Cholesterol Cal: 20 mg/dL (ref 5–40)

## 2017-11-10 LAB — LIPASE: Lipase: 15 U/L (ref 13–78)

## 2017-11-10 LAB — PSA: PROSTATE SPECIFIC AG, SERUM: 1.1 ng/mL (ref 0.0–4.0)

## 2017-11-10 MED ORDER — METOPROLOL TARTRATE 25 MG PO TABS: ORAL_TABLET | ORAL | 5 refills | Status: DC

## 2017-11-10 NOTE — Progress Notes (Signed)
Clinical Summary Christopher [name] is a 57 y.o.male seen today for follow up of the following medical problems.   1. Chest pain  - previous ER visit 04/2013 with chest pain, thought to be non-cardiac  - Trop neg, EKG with NSR and no acute changes. CXR no acute process  CAD: HTN, HL, father MI 84s.  - denies any recent symptoms since our last visit - he completed a 05/2013 GXT that was negative for ischemia, with excellent functional capacity. Echo did not show any significant pathology.  06/2016 cardiac CT calcium score of 0  - recent GERD like symptoms, pcp has started him on PPI just yesterday   2. Afib - noted during Jan 2019 admission after knee replacement - CHADS2Vasc score 1, was not started on anticoag. Lopressor increased for rate control, converted back to SR.   - occasional palpitations. Occurs 3-4 times a week. Often with activity. Appears he is no longer taking lopressor, he is not sure what happened to it. Denies any specific side effects    Past Medical History:  Diagnosis Date  . Arthritis   . Atrial fibrillation ([place])    a. occuring in the post-operative setting in 02/2017  . HTN (hypertension)   . Hyperlipemia      Allergies  Allergen Reactions  . Hydrocodone Itching     Current Outpatient Medications  Medication Sig Dispense Refill  . amLODipine (NORVASC) 5 MG tablet Take 1 tablet (5 mg total) by mouth daily. 30 tablet 5  . atorvastatin (LIPITOR) 10 MG tablet TAKE 1 TABLET BY MOUTH ONCE DAILY 30 tablet 1  . metoprolol tartrate (LOPRESSOR) 25 MG tablet 1 twice daily 60 tablet 5  . pantoprazole (PROTONIX) 40 MG tablet Take 1 tablet (40 mg total) by mouth daily. 30 tablet 3   No current facility-administered medications for this visit.      Past Surgical History:  Procedure Laterality Date  . EXAM UNDER ANESTHESIA WITH MANIPULATION OF KNEE Right 04/05/2017   Procedure: EXAM UNDER ANESTHESIA WITH MANIPULATION OF KNEE;  Surgeon: Carole Civil, MD;   Location: [facility];  Service: Orthopedics;  Laterality: Right;  . HERNIA REPAIR Right   . KNEE ARTHROSCOPY  right knee  . TOTAL KNEE ARTHROPLASTY Right 02/15/2017   Procedure: TOTAL KNEE ARTHROPLASTY;  Surgeon: Carole Civil, MD;  Location: [facility];  Service: Orthopedics;  Laterality: Right;     Allergies  Allergen Reactions  . Hydrocodone Itching      Family History  Problem Relation Age of Onset  . Arthritis Unknown   . Cancer Unknown   . Diabetes Unknown   . Other Mother 14       sepsis  . CAD Father 68  . CAD Brother 73  . Diabetes Brother      Social History Mr. Vanorder reports that he has never smoked. He has never used smokeless tobacco. Mr. Kempton reports that he drinks alcohol.   Review of Systems CONSTITUTIONAL: No weight loss, fever, chills, weakness or fatigue.  HEENT: Eyes: No visual loss, blurred vision, double vision or yellow sclerae.No hearing loss, sneezing, congestion, runny nose or sore throat.  SKIN: No rash or itching.  CARDIOVASCULAR: per hpi RESPIRATORY: No shortness of breath, cough or sputum.  GASTROINTESTINAL: No anorexia, nausea, vomiting or diarrhea. No abdominal pain or blood.  GENITOURINARY: No burning on urination, no polyuria NEUROLOGICAL: No headache, dizziness, syncope, paralysis, ataxia, numbness or tingling in the extremities. No change in bowel or bladder control.  MUSCULOSKELETAL: No muscle, back pain, joint pain or stiffness.  LYMPHATICS: No enlarged nodes. No history of splenectomy.  PSYCHIATRIC: No history of depression or anxiety.  ENDOCRINOLOGIC: No reports of sweating, cold or heat intolerance. No polyuria or polydipsia.  [name]   Physical Examination Vitals:   11/10/17 1423  BP: 120/74  Pulse: 70  SpO2: 98%   Vitals:   11/10/17 1423  Weight: 193 lb (87.5 kg)  Height: 5\' 11"  (1.803 m)    Gen: resting comfortably, no acute distress HEENT: no scleral icterus, pupils equal round and reactive, no palptable cervical  adenopathy,  CV: RRR, no m/r/g, no jvd Resp: Clear to auscultation bilaterally GI: abdomen is soft, non-tender, non-distended, normal bowel sounds, no hepatosplenomegaly MSK: extremities are warm, no edema.  Skin: warm, no rash Neuro:  no focal deficits Psych: appropriate affect   Diagnostic Studies 05/23/13 GXT Attending Note  Patient exercised according to the Bruce protocol for 12 minutes achieving a workload of 13.4 METs. Heart increased from 47 bpm up to 150 bpm (88% of THR). Blood pressure increased from 146/80 up to 220/85. The test was stopped due to fatigue, the patient did not experience any chest pain.  Baseline EKG showed NSR. There were no ischemic EKG changes and no significant arrhythmias during stress.  Conclusions  1. Negative exercise stress test for ischemia  2. Duke treadmill score of 12, consistent with low risk for major cardiac events  3. Excellent exercise functional capacity (140% of predicted based on age and gender)  05/2013 Carotid US IMPRESSION: 1. Extensive bilateral intimal thickening but no focal plaque accumulation or stenosis.  05/2013 Echo Study Conclusions  - Left ventricle: The cavity size was normal. Wall thickness was increased in a pattern of mild LVH. Systolic function was normal. The estimated ejection fraction was in the range of 55% to 60%. Wall motion was normal; there were no regional wall motion abnormalities. Left ventricular diastolic function parameters were normal. - Mitral valve: Trivial regurgitation. - Right ventricle: The cavity size was mildly dilated. Systolic function was normal. - Right atrium: The atrium was mildly dilated. Central venous pressure: 103mm Hg (est). - Atrial septum: No defect or patent foramen ovale was identified. - Tricuspid valve: Trivial regurgitation. - Pulmonic valve: Mild regurgitation. - Pulmonary arteries: PA peak pressure: 6mm Hg (S). - Pericardium, extracardiac: There was no  pericardial effusion. Impressions:  - Normal LV chanber size with mild LVH and LVEF 55-60%. Normal diastolic function. Mildly dilated RV with normal contraction, mild right atrial enlargement. No obvious PFO or ASD. Normal PASP 29 mmHg. Transthoracic echocardiography. M-mode, complete   06/2016 Cardiac CT FINDINGS: Non-cardiac: See separate report from [facility]. No significant findings on limited lung and soft tissue windows.  Calcium Score:  0  Coronary Arteries: Right dominant with no anomalies  LM:  Normal  LAD:  Normal  D1: Normal  D2: Normal  Circumflex: Normal  OM1:  Normal  AV groove normal  RCA:  Normal  PDA: Normal  PLA:  Normal  IMPRESSION: Normal right dominant coronary arteries  Calcium Score of 0  Normal aortic root 2.8 cm  No proximal pulmonary emboli   Assessment and Plan  1. Chest pain  - negative cardiac evaluations in the past with normal GXT as well as cardiac CT score of 0.  - pcp recently started him on PPI for GERD symptoms, no further cardiac testing at this time.   2. Afib - initially detected postop knee surgery - he stopped his  lopressor, he is not sure what happened to it. Having palpitations, we will restart lopressor 25mg  bid - CHADS2Vasc score is 1, anticoag is not indicated.  - EKG today shows normal sinus rhythm      Arnoldo Lenis, M.D.

## 2017-11-10 NOTE — Patient Instructions (Signed)

## 2017-11-16 ENCOUNTER — Ambulatory Visit (INDEPENDENT_AMBULATORY_CARE_PROVIDER_SITE_OTHER): Payer: BLUE CROSS/BLUE SHIELD

## 2017-11-16 ENCOUNTER — Other Ambulatory Visit: Payer: Self-pay | Admitting: Orthopedic Surgery

## 2017-11-16 ENCOUNTER — Ambulatory Visit: Payer: BLUE CROSS/BLUE SHIELD | Admitting: Orthopedic Surgery

## 2017-11-16 VITALS — BP 139/82 | HR 67 | Ht 71.0 in | Wt 193.0 lb

## 2017-11-16 DIAGNOSIS — M24661 Ankylosis, right knee: Secondary | ICD-10-CM

## 2017-11-16 NOTE — Progress Notes (Signed)
Chief Complaint  Patient presents with  . Follow-up    Recheck on right knee, DOS 02-15-17.    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  After manipulation he again had greater than 100 degrees of flexion and it deteriorated  He does not have infection his prosthesis looks to be in good position normal posterior femoral offset and normal slope has wrist been restored to the knee implants  Recommend either revision or he will live with what he has  I will see him in 2 months he will think about the surgery I stressed to him that the surgery is quite extensive and no guarantee of resolution  Encounter Diagnosis  Name Primary?  [name] Arthrofibrosis of knee joint, right s/p Manipulation 04/05/17 Yes

## 2017-11-17 ENCOUNTER — Other Ambulatory Visit (INDEPENDENT_AMBULATORY_CARE_PROVIDER_SITE_OTHER): Payer: Self-pay | Admitting: *Deleted

## 2017-11-17 DIAGNOSIS — Z1211 Encounter for screening for malignant neoplasm of colon: Secondary | ICD-10-CM

## 2018-01-18 ENCOUNTER — Ambulatory Visit: Payer: BLUE CROSS/BLUE SHIELD | Admitting: Orthopedic Surgery

## 2018-02-03 ENCOUNTER — Encounter (INDEPENDENT_AMBULATORY_CARE_PROVIDER_SITE_OTHER): Payer: Self-pay | Admitting: *Deleted

## 2018-02-03 ENCOUNTER — Telehealth (INDEPENDENT_AMBULATORY_CARE_PROVIDER_SITE_OTHER): Payer: Self-pay | Admitting: *Deleted

## 2018-02-03 NOTE — Telephone Encounter (Signed)
Patient needs suprep 

## 2018-02-05 ENCOUNTER — Encounter (HOSPITAL_COMMUNITY): Payer: Self-pay | Admitting: Emergency Medicine

## 2018-02-05 ENCOUNTER — Other Ambulatory Visit: Payer: Self-pay

## 2018-02-05 ENCOUNTER — Emergency Department (HOSPITAL_COMMUNITY): Payer: PRIVATE HEALTH INSURANCE

## 2018-02-05 ENCOUNTER — Emergency Department (HOSPITAL_COMMUNITY)
Admission: EM | Admit: 2018-02-05 | Discharge: 2018-02-05 | Disposition: A | Discharge status: Home / Self Care | Payer: PRIVATE HEALTH INSURANCE | Attending: Emergency Medicine | Admitting: Emergency Medicine

## 2018-02-05 DIAGNOSIS — I10 Essential (primary) hypertension: Secondary | ICD-10-CM | POA: Diagnosis not present

## 2018-02-05 DIAGNOSIS — R51 Headache: Secondary | ICD-10-CM | POA: Diagnosis not present

## 2018-02-05 DIAGNOSIS — Z96651 Presence of right artificial knee joint: Secondary | ICD-10-CM | POA: Diagnosis not present

## 2018-02-05 DIAGNOSIS — Z79899 Other long term (current) drug therapy: Secondary | ICD-10-CM | POA: Diagnosis not present

## 2018-02-05 DIAGNOSIS — R519 Headache, unspecified: Secondary | ICD-10-CM

## 2018-02-05 NOTE — ED Triage Notes (Signed)
Pt reports intermittent headache with dizziness for 2 or more weeks. Pt reports went to fire department to have BP checked and was told it was elevated. Denies CP/SOB. Pt states PCP did change his medication approximately 1 month ago.

## 2018-02-05 NOTE — ED Provider Notes (Signed)
[facility] EMERGENCY DEPARTMENT Provider Note   CSN: [id number] Arrival date & time: 02/05/18  1414     History   Chief Complaint Chief Complaint  Patient presents with  . Hypertension    HPI Christopher [name] is a 58 y.o. male.  HPI  59 year old male, he has a known history of arthritis and atrial fibrillation, currently being treated with only atenolol for hypertension.  He presents today with a complaint of a headache which is been present for approximately 3 or 4 weeks though the son who accompanies the patient states that it is actually been going on for several months.  The patient states it is always right parietal occipital location, it is a heavy tight feeling and associated with a feeling of being off balance when it comes on.  It usually lasts a couple minutes but sometimes longer and it only occurs at night or first thing in the morning but never during the day.  There is no fever stiff neck blurred vision slurred speech facial droop, numbness or weakness of the arms or the legs, chest pain coughing or shortness of breath, nausea vomiting or diarrhea, rashes or any other symptoms.  He takes no medications for it but just waits for it to go away.  The patient did go to the fire station where they checked his blood pressure and recommend that he come here because of some elevation of his blood pressure.  The patient reports that he does not smoke cigarettes, drinks only occasional alcohol and has been compliant with his blood pressure medications.  He follows with Dr. Wolfgang [name] locally  Past Medical History:  Diagnosis Date  . Arthritis   . Atrial fibrillation ([place])    a. occuring in the post-operative setting in 02/2017  . HTN (hypertension)   . Hyperlipemia     Patient Active Problem List   Diagnosis Date Noted  . Special screening for malignant neoplasms, colon 11/17/2017  . Arthrofibrosis of knee joint, right s/p Manipulation 04/05/17 04/05/2017  . S/P total knee  replacement, right 02/15/17 03/01/2017  . Primary osteoarthritis of both knees 10/13/2016  . Chest pain 06/06/2016  . Hyperlipemia 04/16/2013  . HTN (hypertension), benign 04/16/2013  . Impingement syndrome of right shoulder 03/08/2011    Past Surgical History:  Procedure Laterality Date  . EXAM UNDER ANESTHESIA WITH MANIPULATION OF KNEE Right 04/05/2017   Procedure: EXAM UNDER ANESTHESIA WITH MANIPULATION OF KNEE;  Surgeon: Carole Civil, MD;  Location: [facility];  Service: Orthopedics;  Laterality: Right;  . HERNIA REPAIR Right   . KNEE ARTHROSCOPY  right knee  . TOTAL KNEE ARTHROPLASTY Right 02/15/2017   Procedure: TOTAL KNEE ARTHROPLASTY;  Surgeon: Carole Civil, MD;  Location: [facility];  Service: Orthopedics;  Laterality: Right;        Home Medications    Prior to Admission medications   Medication Sig Start Date End Date Taking? Authorizing Provider  amLODipine (NORVASC) 5 MG tablet Take 1 tablet (5 mg total) by mouth daily. 08/24/17  Yes Kathyrn Drown, MD  atorvastatin (LIPITOR) 10 MG tablet TAKE 1 TABLET BY MOUTH ONCE DAILY 09/07/17  Yes Kathyrn Drown, MD  metoprolol tartrate (LOPRESSOR) 25 MG tablet 1 twice daily Patient taking differently: Take 25 mg by mouth 2 (two) times daily. 1 twice daily 11/10/17  Yes Branch, Alphonse Guild, MD  pantoprazole (PROTONIX) 40 MG tablet Take 1 tablet (40 mg total) by mouth daily. 11/09/17  Yes Kathyrn Drown, MD  Family History Family History  Problem Relation Age of Onset  . Arthritis Other   . Cancer Other   . Diabetes Other   . Other Mother 33       sepsis  . CAD Father 66  . CAD Brother 77  . Diabetes Brother     Social History Social History   Tobacco Use  . Smoking status: Never Smoker  . Smokeless tobacco: Never Used  Substance Use Topics  . Alcohol use: Yes    Comment: occasional beer  . Drug use: No     Allergies   Hydrocodone   Review of Systems Review of Systems  All other systems reviewed and  are negative.    Physical Exam Updated Vital Signs BP (!) 141/90 (BP Location: Right Arm)   Pulse 64   Temp 97.9 F (36.6 C) (Oral)   Resp 16   Ht 1.803 m (5\' 11" )   Wt 87.5 kg   SpO2 99%   BMI 26.92 kg/m   Physical Exam Vitals signs and nursing note reviewed.  Constitutional:      General: [name] not in acute distress.    Appearance: [name] well-developed.  HENT:     Head: Normocephalic and atraumatic.     Comments: No ttp over the temporal artery    Mouth/Throat:     Pharynx: No oropharyngeal exudate.  Eyes:     General: No scleral icterus.       Right eye: No discharge.        Left eye: No discharge.     Conjunctiva/sclera: Conjunctivae normal.     Pupils: Pupils are equal, round, and reactive to light.  Neck:     Musculoskeletal: Normal range of motion and neck supple.     Thyroid: No thyromegaly.     Vascular: No carotid bruit or JVD.  Cardiovascular:     Rate and Rhythm: Normal rate and regular rhythm.     Heart sounds: Normal heart sounds. No murmur. No friction rub. No gallop.   Pulmonary:     Effort: Pulmonary effort is normal. No respiratory distress.     Breath sounds: Normal breath sounds. No wheezing or rales.  Abdominal:     General: Bowel sounds are normal. There is no distension.     Palpations: Abdomen is soft. There is no mass.     Tenderness: There is no abdominal tenderness.  Musculoskeletal: Normal range of motion.        General: No tenderness.  Lymphadenopathy:     Cervical: No cervical adenopathy.  Skin:    General: Skin is warm and dry.     Findings: No erythema or rash.  Neurological:     Mental Status: [name] alert.     Coordination: Coordination normal.     Comments: Neurologic exam:  Speech clear, pupils equal round reactive to light, extraocular movements intact  Normal peripheral visual fields Cranial nerves III through XII normal including no facial droop Follows commands, moves all extremities x4, normal strength to bilateral  upper and lower extremities at all major muscle groups including grip Sensation normal to light touch and pinprick Coordination intact, no limb ataxia, finger-nose-finger normal, heel shin normal bilaterally Rapid alternating movements normal No pronator drift Gait normal Can heal and toe walk without weakness.   Psychiatric:        Behavior: Behavior normal.      ED Treatments / Results  Labs (all labs ordered are listed, but only abnormal results  are displayed) Labs Reviewed - No data to display  EKG EKG Interpretation  Date/Time:  Sunday February 05 2018 14:49:23 EST Ventricular Rate:  65 PR Interval:    QRS Duration: 79 QT Interval:  403 QTC Calculation: 419 R Axis:   77 Text Interpretation:  Sinus rhythm Confirmed by Fredia Sorrow [phone]) on 02/06/2018 11:28:41 AM   Radiology Ct Head Wo Contrast  Result Date: 02/05/2018 CLINICAL DATA:  Intermittent headache with dizziness 2 or more weeks. EXAM: CT HEAD WITHOUT CONTRAST TECHNIQUE: Contiguous axial images were obtained from the base of the skull through the vertex without intravenous contrast. COMPARISON:  None. FINDINGS: Brain: Ventricles, cisterns and other CSF spaces are normal. There is no mass, mass effect, shift of midline structures or acute hemorrhage. No evidence of acute infarction. Vascular: No hyperdense vessel or unexpected calcification. Skull: Normal. Negative for fracture or focal lesion. Sinuses/Orbits: Orbits are normal. Hypoplastic frontal sinuses. Remaining paranasal sinuses are clear. Other: None. IMPRESSION: No acute findings. Electronically Signed   By: Marin Olp M.D.   On: 02/05/2018 15:52    Procedures Procedures (including critical care time)  Medications Ordered in ED Medications - No data to display   Initial Impression / Assessment and Plan / ED Course  I have reviewed the triage vital signs and the nursing notes.  Pertinent labs & imaging results that were available during my care of  the patient were reviewed by me and considered in my medical decision making (see chart for details).  Clinical Course as of Feb 07 1748  Sun Feb 05, 2018  1610 CT scan of the brain is negative for acute masses, abscesses, stroke or obvious aneurysm.  Patient informed, blood pressure slightly elevated but doubt that this is the cause of the patient's fluctuating headaches.  He will be referred back to his family doctor's office and asked to take some low-dose anti-inflammatories over the next couple of weeks.  Patient agreeable   [BM]    Clinical Course User Index [BM] Noemi Chapel, MD   The timing of the pain suggest that this is more environmental however the patient states that he is not employed and no one else in the family has had any headaches making carbon monoxide poisoning less likely as well.  At this time the patient will have a CT scan of the head due to the neurologic symptoms that come on with a headache.  He has been having this almost daily for weeks and I think that this would be unlikely to be a TIA that came in that chronologic pattern.  The patient is agreeable to outpatient follow-up if CT scan is negative.  [name] not having a headache at this time.  Final Clinical Impressions(s) / ED Diagnoses   Final diagnoses:  Essential hypertension  Intermittent headache    ED Discharge Orders    None       Noemi Chapel, MD 02/06/18 1750

## 2018-02-05 NOTE — Discharge Instructions (Signed)
Please take 200 mg of ibuprofen 3 times a day for the next week then only as needed after that.  You will need to follow-up closely with your doctor for ongoing blood pressure management.  You may need to have increases in your dose but at this time it seems okay for the next couple of days.  Call your doctor's office in the morning to make a follow-up appointment.  You should be seen within the week for a recheck.  Your CT scan was normal, no signs of tumors strokes or bleeding or aneurysms, if you should develop severe or worsening symptoms emergency department follow-up is necessary.

## 2018-02-07 MED ORDER — SUPREP BOWEL PREP KIT 17.5-3.13-1.6 GM/177ML PO SOLN: 1.0000 | Freq: Once | ORAL | 0 refills | Status: AC

## 2018-02-16 ENCOUNTER — Telehealth (INDEPENDENT_AMBULATORY_CARE_PROVIDER_SITE_OTHER): Payer: Self-pay | Admitting: *Deleted

## 2018-02-16 NOTE — Telephone Encounter (Signed)
Referring MD/PCP: scott luking   Procedure: tcs  Reason/Indication:  screening  Has patient had this procedure before?  no  If so, when, by whom and where?    Is there a family history of colon cancer?  no  Who?  What age when diagnosed?    Is patient diabetic?   no      Does patient have prosthetic heart valve or mechanical valve?  no  Do you have a pacemaker?  o  Has patient ever had endocarditis? no  Has patient had joint replacement within last 12 months?  no  Is patient constipated or do they take laxatives? no  Does patient have a history of alcohol/drug use?  no  Is patient on blood thinner such as Coumadin, Plavix and/or Aspirin? no  Medications: atorvastatin 10 mg daily, metoprolol 25 mg daily  Allergies: nkda  Medication Adjustment per Dr Lindi Adie, NP:   Procedure date & time: 03/15/17 at 830

## 2018-02-16 NOTE — Telephone Encounter (Signed)
agree

## 2018-02-27 ENCOUNTER — Ambulatory Visit (INDEPENDENT_AMBULATORY_CARE_PROVIDER_SITE_OTHER): Payer: PRIVATE HEALTH INSURANCE

## 2018-02-27 ENCOUNTER — Ambulatory Visit: Payer: PRIVATE HEALTH INSURANCE | Admitting: Orthopedic Surgery

## 2018-02-27 VITALS — BP 129/64 | HR 95 | Ht 71.0 in | Wt 193.0 lb

## 2018-02-27 DIAGNOSIS — Z96651 Presence of right artificial knee joint: Secondary | ICD-10-CM

## 2018-02-27 DIAGNOSIS — M24661 Ankylosis, right knee: Secondary | ICD-10-CM

## 2018-02-27 NOTE — Progress Notes (Signed)
Chief Complaint  Patient presents with  . Follow-up    Recheck on rigth knee, DOS 04-05-17.    This is basically his one-year follow-up status post right total knee on February 15, 2017 followed by manipulation on March 5  Has persistent lack of flexion has full extension he says he is not hurting  He says he is not interested in having further surgery on the right knee  View of systems: Scale skeletal: [name] complaining of some left knee pain  BP 129/64   Pulse 95   Ht 5\' 11"  (1.803 m)   Wt 193 lb (87.5 kg)   BMI 26.92 kg/m  [name] awake he is alert  he is orientedx3  his mood and affect are normal  he walks with no assistive devices 0 to 60 degrees range of motion knee feels stable no warmth or tenderness incision is clean dry and intact without erythema  He walks fairly well without a limp  Encounter Diagnoses  Name Primary?  [name] Arthrofibrosis of knee joint, right s/p Manipulation 04/05/17 Yes  . S/P total knee replacement, right 02/15/17     X-ray alignment is normal the patella seems scarred down which is where we had difficulty.  It seems as if that is where the scarring occurred the alignment looks good as stated no loosening  Impression normal total knee film  Yearly x-rays  As far as the left knee goes is call on when to have any procedure done

## 2018-03-17 ENCOUNTER — Other Ambulatory Visit: Payer: Self-pay | Admitting: Family Medicine

## 2018-05-09 ENCOUNTER — Other Ambulatory Visit: Payer: Self-pay | Admitting: Family Medicine

## 2018-05-09 NOTE — Telephone Encounter (Signed)
30 days with 2 refills needs office visit by May or June

## 2018-05-10 ENCOUNTER — Ambulatory Visit (INDEPENDENT_AMBULATORY_CARE_PROVIDER_SITE_OTHER): Payer: PRIVATE HEALTH INSURANCE

## 2018-05-10 ENCOUNTER — Other Ambulatory Visit: Payer: Self-pay

## 2018-05-10 ENCOUNTER — Ambulatory Visit: Payer: PRIVATE HEALTH INSURANCE | Admitting: Podiatry

## 2018-05-10 ENCOUNTER — Encounter: Payer: Self-pay | Admitting: Podiatry

## 2018-05-10 VITALS — Temp 97.7°F

## 2018-05-10 DIAGNOSIS — M722 Plantar fascial fibromatosis: Secondary | ICD-10-CM

## 2018-05-10 MED ORDER — MELOXICAM 15 MG PO TABS: 15.0000 mg | ORAL_TABLET | Freq: Every day | ORAL | 1 refills | Status: DC

## 2018-05-10 MED ORDER — METHYLPREDNISOLONE 4 MG PO TBPK: ORAL_TABLET | ORAL | 0 refills | Status: DC

## 2018-05-10 NOTE — Progress Notes (Signed)
   Subjective: 58 y.o. male presents today for evaluation of left foot pain is been going on approximately 2 months now.  Pain is currently 7/10.  Patient denies injury.  Gradual onset now it hurts to walk.  Experiences sharp shooting pains.  He has been taking Tylenol and Advil with little relief.   Past Medical History:  Diagnosis Date  . Arthritis   . Atrial fibrillation ([place])    a. occuring in the post-operative setting in 02/2017  . HTN (hypertension)   . Hyperlipemia      Objective: Physical Exam General: The patient is alert and oriented x3 in no acute distress.  Dermatology: Skin is warm, dry and supple bilateral lower extremities. Negative for open lesions or macerations bilateral.   Vascular: Dorsalis Pedis and Posterior Tibial pulses palpable bilateral.  Capillary fill time is immediate to all digits.  Neurological: Epicritic and protective threshold intact bilateral.   Musculoskeletal: Tenderness to palpation to the plantar aspect of the left heel along the plantar fascia. All other joints range of motion within normal limits bilateral. Strength 5/5 in all groups bilateral.   Radiographic exam: Normal osseous mineralization. Joint spaces preserved. No fracture/dislocation/boney destruction. No other soft tissue abnormalities or radiopaque foreign bodies.   Assessment: 1. Plantar fasciitis left foot  Plan of Care:  1. Patient evaluated. Xrays reviewed.   2. Injection of 0.5cc Celestone soluspan injected into the left plantar fascia.  3. Rx for Medrol Dose Pak placed 4. Rx for Meloxicam ordered for patient. 5. Plantar fascial band(s) dispensed  6. Instructed patient regarding therapies and modalities at home to alleviate symptoms.  7. Return to clinic in 4 weeks.     Edrick Kins, DPM [facility]  Dr. Edrick Kins, DPM    [address], [place] [address]                Office [phone]  Fax  [phone]

## 2018-05-27 ENCOUNTER — Other Ambulatory Visit: Payer: Self-pay | Admitting: Family Medicine

## 2018-05-29 NOTE — Telephone Encounter (Signed)
May have 1 refill  Needs virtual visit

## 2018-06-07 ENCOUNTER — Encounter (INDEPENDENT_AMBULATORY_CARE_PROVIDER_SITE_OTHER): Payer: Self-pay | Admitting: *Deleted

## 2018-06-07 ENCOUNTER — Ambulatory Visit: Payer: PRIVATE HEALTH INSURANCE | Admitting: Podiatry

## 2018-07-01 ENCOUNTER — Other Ambulatory Visit: Payer: Self-pay | Admitting: Family Medicine

## 2018-07-03 ENCOUNTER — Telehealth (INDEPENDENT_AMBULATORY_CARE_PROVIDER_SITE_OTHER): Payer: Self-pay | Admitting: *Deleted

## 2018-07-03 NOTE — Telephone Encounter (Signed)
Referring MD/PCP: scott luking   Procedure: tcs  Reason/Indication:  screening  Has patient had this procedure before?  no             If so, when, by whom and where?    Is there a family history of colon cancer?  no             Who?  What age when diagnosed?    Is patient diabetic?   no                                                  Does patient have prosthetic heart valve or mechanical valve?  no  Do you have a pacemaker?  o  Has patient ever had endocarditis? no  Has patient had joint replacement within last 12 months?  no  Is patient constipated or do they take laxatives? no  Does patient have a history of alcohol/drug use?  no  Is patient on blood thinner such as Coumadin, Plavix and/or Aspirin? no  Medications: atorvastatin 10 mg daily, metoprolol 25 mg daily  Allergies: nkda  Medication Adjustment per Dr Lindi Adie, NP:   Procedure date & time: 07/13/18 at 730

## 2018-07-03 NOTE — Telephone Encounter (Signed)
agree

## 2018-07-04 NOTE — Telephone Encounter (Signed)
May do this +2 refills will need follow-up visit this summer

## 2018-07-10 ENCOUNTER — Other Ambulatory Visit (HOSPITAL_COMMUNITY)
Admission: RE | Admit: 2018-07-10 | Discharge: 2018-07-10 | Disposition: A | Discharge status: Home / Self Care | Payer: PRIVATE HEALTH INSURANCE | Source: Ambulatory Visit | Attending: Internal Medicine | Admitting: Internal Medicine

## 2018-07-10 ENCOUNTER — Other Ambulatory Visit: Payer: Self-pay

## 2018-07-10 DIAGNOSIS — Z1159 Encounter for screening for other viral diseases: Secondary | ICD-10-CM | POA: Diagnosis present

## 2018-07-11 LAB — NOVEL CORONAVIRUS, NAA (HOSP ORDER, SEND-OUT TO REF LAB; TAT 18-24 HRS): SARS-CoV-2, NAA: NOT DETECTED

## 2018-07-13 ENCOUNTER — Encounter (HOSPITAL_COMMUNITY)
Admission: RE | Disposition: A | Discharge status: Home / Self Care | Payer: Self-pay | Source: Home / Self Care | Attending: Internal Medicine

## 2018-07-13 ENCOUNTER — Encounter (HOSPITAL_COMMUNITY): Payer: Self-pay | Admitting: *Deleted

## 2018-07-13 ENCOUNTER — Other Ambulatory Visit: Payer: Self-pay

## 2018-07-13 ENCOUNTER — Ambulatory Visit (HOSPITAL_COMMUNITY)
Admission: RE | Admit: 2018-07-13 | Discharge: 2018-07-13 | Disposition: A | Discharge status: Home / Self Care | Payer: PRIVATE HEALTH INSURANCE | Attending: Internal Medicine | Admitting: Internal Medicine

## 2018-07-13 DIAGNOSIS — Z791 Long term (current) use of non-steroidal anti-inflammatories (NSAID): Secondary | ICD-10-CM | POA: Insufficient documentation

## 2018-07-13 DIAGNOSIS — Z79899 Other long term (current) drug therapy: Secondary | ICD-10-CM | POA: Insufficient documentation

## 2018-07-13 DIAGNOSIS — K219 Gastro-esophageal reflux disease without esophagitis: Secondary | ICD-10-CM | POA: Diagnosis not present

## 2018-07-13 DIAGNOSIS — M1990 Unspecified osteoarthritis, unspecified site: Secondary | ICD-10-CM | POA: Insufficient documentation

## 2018-07-13 DIAGNOSIS — D125 Benign neoplasm of sigmoid colon: Secondary | ICD-10-CM | POA: Insufficient documentation

## 2018-07-13 DIAGNOSIS — Z885 Allergy status to narcotic agent status: Secondary | ICD-10-CM | POA: Diagnosis not present

## 2018-07-13 DIAGNOSIS — E785 Hyperlipidemia, unspecified: Secondary | ICD-10-CM | POA: Insufficient documentation

## 2018-07-13 DIAGNOSIS — D123 Benign neoplasm of transverse colon: Secondary | ICD-10-CM

## 2018-07-13 DIAGNOSIS — Z1211 Encounter for screening for malignant neoplasm of colon: Secondary | ICD-10-CM

## 2018-07-13 DIAGNOSIS — Z96651 Presence of right artificial knee joint: Secondary | ICD-10-CM | POA: Insufficient documentation

## 2018-07-13 DIAGNOSIS — K644 Residual hemorrhoidal skin tags: Secondary | ICD-10-CM | POA: Insufficient documentation

## 2018-07-13 DIAGNOSIS — I10 Essential (primary) hypertension: Secondary | ICD-10-CM | POA: Insufficient documentation

## 2018-07-13 HISTORY — PX: POLYPECTOMY: SHX5525

## 2018-07-13 HISTORY — DX: Gastro-esophageal reflux disease without esophagitis: K21.9

## 2018-07-13 HISTORY — PX: COLONOSCOPY: SHX5424

## 2018-07-13 SURGERY — COLONOSCOPY
Anesthesia: Moderate Sedation

## 2018-07-13 MED ORDER — STERILE WATER FOR IRRIGATION IR SOLN
Status: DC | PRN
  Administered 2018-07-13: 5 mL

## 2018-07-13 MED ORDER — SODIUM CHLORIDE 0.9 % IV SOLN
INTRAVENOUS | Status: DC
  Administered 2018-07-13: 07:00:00 via INTRAVENOUS

## 2018-07-13 MED ORDER — MEPERIDINE HCL 50 MG/ML IJ SOLN
INTRAMUSCULAR | Status: AC
  Filled 2018-07-13: qty 1

## 2018-07-13 MED ORDER — MIDAZOLAM HCL 5 MG/5ML IJ SOLN
INTRAMUSCULAR | Status: DC | PRN
  Administered 2018-07-13 (×2): 2 mg via INTRAVENOUS

## 2018-07-13 MED ORDER — MIDAZOLAM HCL 5 MG/5ML IJ SOLN
INTRAMUSCULAR | Status: AC
  Filled 2018-07-13: qty 10

## 2018-07-13 MED ORDER — MEPERIDINE HCL 50 MG/ML IJ SOLN
INTRAMUSCULAR | Status: DC | PRN
  Administered 2018-07-13 (×2): 25 mg

## 2018-07-13 NOTE — OR Nursing (Signed)
Due to COVID-19 restrictions, discharge instructions discussed with son, Elis Sauber, outside. He verbalized understanding. Denies any further questions.

## 2018-07-13 NOTE — Op Note (Signed)
[facility] Patient Name: Christopher [name] Procedure Date: 07/13/2018 7:04 AM MRN: [id number] Date of Birth: [birth year] Attending MD: Hildred Laser , MD CSN: [id number] Age: 58 Admit Type: Outpatient Procedure:                Colonoscopy Indications:              Screening for colorectal malignant neoplasm Providers:                Hildred Laser, MD, Janeece Riggers, RN, Raphael Gibney,                            Technician Referring MD:             [name]. Wolfgang Phoenix, MD Medicines:                Meperidine 50 mg IV, Midazolam 4 mg IV Complications:            No immediate complications. Estimated Blood Loss:     Estimated blood loss was minimal. Procedure:                Pre-Anesthesia Assessment:                           - Prior to the procedure, a History and Physical                            was performed, and patient medications and                            allergies were reviewed. The patient's tolerance of                            previous anesthesia was also reviewed. The risks                            and benefits of the procedure and the sedation                            options and risks were discussed with the patient.                            All questions were answered, and informed consent                            was obtained. Prior Anticoagulants: The patient has                            taken no previous anticoagulant or antiplatelet                            agents except for NSAID medication. ASA Grade                            Assessment: II - A patient with mild systemic  disease. After reviewing the risks and benefits,                            the patient was deemed in satisfactory condition to                            undergo the procedure.                           After obtaining informed consent, the colonoscope                            was passed under direct vision. Throughout the   procedure, the patient's blood pressure, pulse, and                            oxygen saturations were monitored continuously. The                            [id number]-[id number] ([id number]) scope was introduced through                            the anus and advanced to the the cecum, identified                            by appendiceal orifice and ileocecal valve. The                            colonoscopy was performed without difficulty. The                            patient tolerated the procedure well. The quality                            of the bowel preparation was excellent. The                            terminal ileum, ileocecal valve, appendiceal                            orifice, and rectum were photographed. Scope In: 7:39:11 AM Scope Out: 7:58:55 AM Scope Withdrawal Time: 0 hours 13 minutes 38 seconds  Total Procedure Duration: 0 hours 19 minutes 44 seconds  Findings:      The perianal and digital rectal examinations were normal.      Two sessile polyps were found in the descending colon and hepatic       flexure. The polyps were 4 to 7 mm in size. These polyps were removed       with a cold snare. Resection and retrieval were complete. The pathology       specimen was placed into Bottle Number 1.      A 10 mm polyp was found in the distal sigmoid colon. The polyp was       pedunculated. The polyp was removed with a hot snare. Resection and       retrieval were  complete. The pathology specimen was placed into Bottle       Number 2.      External hemorrhoids were found during retroflexion. The hemorrhoids       were small. Impression:               - Two 4 to 7 mm polyps in the descending colon and                            at the hepatic flexure, removed with a cold snare.                            Resected and retrieved.                           - One 10 mm polyp in the distal sigmoid colon,                            removed with a hot snare. Resected and retrieved.                            - External hemorrhoids. Moderate Sedation:      Moderate (conscious) sedation was administered by the endoscopy nurse       and supervised by the endoscopist. The following parameters were       monitored: oxygen saturation, heart rate, blood pressure, CO2       capnography and response to care. Total physician intraservice time was       23 minutes. Recommendation:           - Patient has a contact number available for                            emergencies. The signs and symptoms of potential                            delayed complications were discussed with the                            patient. Return to normal activities tomorrow.                            Written discharge instructions were provided to the                            patient.                           - Resume previous diet today.                           - Continue present medications.                           - No aspirin, ibuprofen, naproxen, or other  non-steroidal anti-inflammatory drugs for 7 days.                           - Await pathology results.                           - Repeat colonoscopy is recommended. The                            colonoscopy date will be determined after pathology                            results from today's exam become available for                            review. Procedure Code(s):        --- Professional ---                           [phone], Colonoscopy, flexible; with removal of                            tumor(s), polyp(s), or other lesion(s) by snare                            technique                           99153, Moderate sedation; each additional 15                            minutes intraservice time                           G0500, Moderate sedation services provided by the                            same physician or other qualified health care                            professional performing a  gastrointestinal                            endoscopic service that sedation supports,                            requiring the presence of an independent trained                            observer to assist in the monitoring of the                            patient's level of consciousness and physiological                            status; initial 15 minutes of intra-service time;  patient age 79 years or older (additional time may                            be reported with [phone], as appropriate) Diagnosis Code(s):        --- Professional ---                           K63.5, Polyp of colon                           Z12.11, Encounter for screening for malignant                            neoplasm of colon                           K64.4, Residual hemorrhoidal skin tags CPT copyright 2019 [employer]. All rights reserved. The codes documented in this report are preliminary and upon coder review may  be revised to meet current compliance requirements. Hildred Laser, MD Hildred Laser, MD 07/13/2018 8:10:29 AM This report has been signed electronically. Number of Addenda: 0

## 2018-07-13 NOTE — Discharge Instructions (Signed)
No aspirin or NSAIDs for 1 week. Resume other medications as before. Resume usual diet as before. No driving for 24 hours. Physician will call with biopsy results.    Colon Polyps  Polyps are tissue growths inside the body. Polyps can grow in many places, including the large intestine (colon). A polyp may be a round bump or a mushroom-shaped growth. You could have one polyp or several. Most colon polyps are noncancerous (benign). However, some colon polyps can become cancerous over time. Finding and removing the polyps early can help prevent this. What are the causes? The exact cause of colon polyps is not known. What increases the risk? You are more likely to develop this condition if you:  Have a family history of colon cancer or colon polyps.  Are older than 47 or older than 45 if you are African American.  Have inflammatory bowel disease, such as ulcerative colitis or Crohn's disease.  Have certain hereditary conditions, such as: ? Familial adenomatous polyposis. ? Lynch syndrome. ? Turcot syndrome. ? Peutz-Jeghers syndrome.  Are overweight.  Smoke cigarettes.  Do not get enough exercise.  Drink too much alcohol.  Eat a diet that is high in fat and red meat and low in fiber.  Had childhood cancer that was treated with abdominal radiation. What are the signs or symptoms? Most polyps do not cause symptoms. If you have symptoms, they may include:  Blood coming from your rectum when having a bowel movement.  Blood in your stool. The stool may look dark red or black.  Abdominal pain.  A change in bowel habits, such as constipation or diarrhea. How is this diagnosed? This condition is diagnosed with a colonoscopy. This is a procedure in which a lighted, flexible scope is inserted into the anus and then passed into the colon to examine the area. Polyps are sometimes found when a colonoscopy is done as part of routine cancer screening tests. How is this  treated? Treatment for this condition involves removing any polyps that are found. Most polyps can be removed during a colonoscopy. Those polyps will then be tested for cancer. Additional treatment may be needed depending on the results of testing. Follow these instructions at home: Lifestyle  Maintain a healthy weight, or lose weight if recommended by your health care provider.  Exercise every day or as told by your health care provider.  Do not use any products that contain nicotine or tobacco, such as cigarettes and e-cigarettes. If you need help quitting, ask your health care provider.  If you drink alcohol, limit how much you have: ? 0-1 drink a day for women. ? 0-2 drinks a day for men.  Be aware of how much alcohol is in your drink. In the [place] one drink equals one 12 oz bottle of beer (355 mL), one 5 oz glass of wine (148 mL), or one 1 oz shot of hard liquor (44 mL). Eating and drinking   Eat foods that are high in fiber, such as fruits, vegetables, and whole grains.  Eat foods that are high in calcium and vitamin D, such as milk, cheese, yogurt, eggs, liver, fish, and broccoli.  Limit foods that are high in fat, such as fried foods and desserts.  Limit the amount of red meat and processed meat you eat, such as hot dogs, sausage, bacon, and lunch meats. General instructions  Keep all follow-up visits as told by your health care provider. This is important. ? This includes having regularly scheduled colonoscopies. ?  Talk to your health care provider about when you need a colonoscopy. Contact a health care provider if:  You have new or worsening bleeding during a bowel movement.  You have new or increased blood in your stool.  You have a change in bowel habits.  You lose weight for no known reason. Summary  Polyps are tissue growths inside the body. Polyps can grow in many places, including the colon.  Most colon polyps are noncancerous (benign), but some can  become cancerous over time.  This condition is diagnosed with a colonoscopy.  Treatment for this condition involves removing any polyps that are found. Most polyps can be removed during a colonoscopy. This information is not intended to replace advice given to you by your health care provider. Make sure you discuss any questions you have with your health care provider. Document Released: 10/15/2003 Document Revised: 05/05/2017 Document Reviewed: 05/05/2017 Elsevier Interactive Patient Education  2019 [employer]  Hemorrhoids Hemorrhoids are swollen veins that may develop: In the butt (rectum). These are called internal hemorrhoids. Around the opening of the butt (anus). These are called external hemorrhoids. Hemorrhoids can cause pain, itching, or bleeding. Most of the time, they do not cause serious problems. They usually get better with diet changes, lifestyle changes, and other home treatments. What are the causes? This condition may be caused by: Having trouble pooping (constipation). Pushing hard (straining) to poop. Watery poop (diarrhea). Pregnancy. Being very overweight (obese). Sitting for long periods of time. Heavy lifting or other activity that causes you to strain. Anal sex. Riding a bike for a long period of time. What are the signs or symptoms? Symptoms of this condition include: Pain. Itching or soreness in the butt. Bleeding from the butt. Leaking poop. Swelling in the area. One or more lumps around the opening of your butt. How is this diagnosed? A doctor can often diagnose this condition by looking at the affected area. The doctor may also: Do an exam that involves feeling the area with a gloved hand (digital rectal exam). Examine the area inside your butt using a small tube (anoscope). Order blood tests. This may be done if you have lost a lot of blood. Have you get a test that involves looking inside the colon using a flexible tube with a camera on the  end (sigmoidoscopy or colonoscopy). How is this treated? This condition can usually be treated at home. Your doctor may tell you to change what you eat, make lifestyle changes, or try home treatments. If these do not help, procedures can be done to remove the hemorrhoids or make them smaller. These may involve: Placing rubber bands at the base of the hemorrhoids to cut off their blood supply. Injecting medicine into the hemorrhoids to shrink them. Shining a type of light energy onto the hemorrhoids to cause them to fall off. Doing surgery to remove the hemorrhoids or cut off their blood supply. Follow these instructions at home: Eating and drinking  Eat foods that have a lot of fiber in them. These include whole grains, beans, nuts, fruits, and vegetables. Ask your doctor about taking products that have added fiber (fibersupplements). Reduce the amount of fat in your diet. You can do this by: Eating low-fat dairy products. Eating less red meat. Avoiding processed foods. Drink enough fluid to keep your pee (urine) pale yellow. Managing pain and swelling  Take a warm-water bath (sitz bath) for 20 minutes to ease pain. Do this 3-4 times a day. You may do this  in a bathtub or using a portable sitz bath that fits over the toilet. If told, put ice on the painful area. It may be helpful to use ice between your warm baths. Put ice in a plastic bag. Place a towel between your skin and the bag. Leave the ice on for 20 minutes, 2-3 times a day. General instructions Take over-the-counter and prescription medicines only as told by your doctor. Medicated creams and medicines may be used as told. Exercise often. Ask your doctor how much and what kind of exercise is best for you. Go to the bathroom when you have the urge to poop. Do not wait. Avoid pushing too hard when you poop. Keep your butt dry and clean. Use wet toilet paper or moist towelettes after pooping. Do not sit on the toilet for a long  time. Keep all follow-up visits as told by your doctor. This is important. Contact a doctor if you: Have pain and swelling that do not get better with treatment or medicine. Have trouble pooping. Cannot poop. Have pain or swelling outside the area of the hemorrhoids. Get help right away if you have: Bleeding that will not stop. Summary Hemorrhoids are swollen veins in the butt or around the opening of the butt. They can cause pain, itching, or bleeding. Eat foods that have a lot of fiber in them. These include whole grains, beans, nuts, fruits, and vegetables. Take a warm-water bath (sitz bath) for 20 minutes to ease pain. Do this 3-4 times a day. This information is not intended to replace advice given to you by your health care provider. Make sure you discuss any questions you have with your health care provider. Document Released: 10/28/2007 Document Revised: 06/09/2017 Document Reviewed: 06/09/2017 Elsevier Interactive Patient Education  2019 [employer].

## 2018-07-13 NOTE — H&P (Signed)
Christopher [name] is an 58 y.o. male.   Chief Complaint: Patient is here for colonoscopy. HPI: Patient is 58 year old Afro-American male who is here for screening colonoscopy.  He denies abdominal pain change in bowel habits or rectal bleeding.  This is patient's first exam. Family history is negative for CRC.  Past Medical History:  Diagnosis Date  . Arthritis   . Atrial fibrillation ([place])    a. occuring in the post-operative setting in 02/2017  . GERD (gastroesophageal reflux disease)   . HTN (hypertension)   . Hyperlipemia     Past Surgical History:  Procedure Laterality Date  . EXAM UNDER ANESTHESIA WITH MANIPULATION OF KNEE Right 04/05/2017   Procedure: EXAM UNDER ANESTHESIA WITH MANIPULATION OF KNEE;  Surgeon: Carole Civil, MD;  Location: [facility];  Service: Orthopedics;  Laterality: Right;  . HERNIA REPAIR Right   . KNEE ARTHROSCOPY  right knee  . TOTAL KNEE ARTHROPLASTY Right 02/15/2017   Procedure: TOTAL KNEE ARTHROPLASTY;  Surgeon: Carole Civil, MD;  Location: [facility];  Service: Orthopedics;  Laterality: Right;    Family History  Problem Relation Age of Onset  . Arthritis Other   . Cancer Other   . Diabetes Other   . Other Mother 21       sepsis  . CAD Father 1  . CAD Brother 59  . Diabetes Brother   . Colon cancer Neg Hx    Social History:  reports that he has never smoked. He has never used smokeless tobacco. He reports current alcohol use. He reports that he does not use drugs.  Allergies:  Allergies  Allergen Reactions  . Hydrocodone Itching    Medications Prior to Admission  Medication Sig Dispense Refill  . amLODipine (NORVASC) 5 MG tablet Take 1 tablet (5 mg total) by mouth daily. 30 tablet 5  . atorvastatin (LIPITOR) 10 MG tablet TAKE 1 TABLET BY MOUTH EVERY DAY 30 tablet 2  . meloxicam (MOBIC) 15 MG tablet Take 1 tablet (15 mg total) by mouth daily. 30 tablet 1  . methylPREDNISolone (MEDROL DOSEPAK) 4 MG TBPK tablet 6 day dose pack - take as  directed 21 tablet 0  . metoprolol tartrate (LOPRESSOR) 25 MG tablet TAKE 1 TABLET BY MOUTH TWICE DAILY 60 tablet 2  . pantoprazole (PROTONIX) 40 MG tablet TAKE 1 TABLET(40 MG) BY MOUTH DAILY 30 tablet 3    No results found for this or any previous visit (from the past 48 hour(s)). No results found.  ROS  Blood pressure (!) 148/102, pulse 86, temperature 97.8 F (36.6 C), temperature source Oral, resp. rate 16, height 5\' 11"  (1.803 m), weight 87.5 kg, SpO2 98 %. Physical Exam  Constitutional: He appears well-developed and well-nourished.  HENT:  Mouth/Throat: Oropharynx is clear and moist.  Eyes: Conjunctivae are normal. No scleral icterus.  Neck: No thyromegaly present.  Cardiovascular: Normal rate, regular rhythm and normal heart sounds.  No murmur heard. Respiratory: Effort normal and breath sounds normal.  GI: Soft. He exhibits no distension and no mass. There is no abdominal tenderness.  Musculoskeletal:        General: No edema.  Lymphadenopathy:    He has no cervical adenopathy.  Neurological: [name] alert.  Skin: Skin is warm and dry.     Assessment/Plan Average rescreening colonoscopy.  Hildred Laser, MD 07/13/2018, 7:32 AM

## 2018-07-19 ENCOUNTER — Other Ambulatory Visit: Payer: Self-pay | Admitting: Family Medicine

## 2018-07-19 ENCOUNTER — Encounter (HOSPITAL_COMMUNITY): Payer: Self-pay | Admitting: Internal Medicine

## 2018-07-19 NOTE — Telephone Encounter (Signed)
Last med check July 2019

## 2018-07-19 NOTE — Telephone Encounter (Signed)
May have this +1 refill needs follow-up office visit this summer virtual

## 2018-08-24 ENCOUNTER — Other Ambulatory Visit: Payer: Self-pay | Admitting: Family Medicine

## 2018-08-24 NOTE — Telephone Encounter (Signed)
Pt needs virtual or office visit; please contact pt to set up appt. Thank you

## 2018-08-24 NOTE — Telephone Encounter (Signed)
Pt is scheduled for 8/4 virtual visit

## 2018-09-05 ENCOUNTER — Other Ambulatory Visit: Payer: Self-pay

## 2018-09-05 ENCOUNTER — Ambulatory Visit (INDEPENDENT_AMBULATORY_CARE_PROVIDER_SITE_OTHER): Payer: PRIVATE HEALTH INSURANCE | Admitting: Family Medicine

## 2018-09-05 DIAGNOSIS — I10 Essential (primary) hypertension: Secondary | ICD-10-CM

## 2018-09-05 DIAGNOSIS — E785 Hyperlipidemia, unspecified: Secondary | ICD-10-CM | POA: Diagnosis not present

## 2018-09-05 DIAGNOSIS — Z79899 Other long term (current) drug therapy: Secondary | ICD-10-CM | POA: Diagnosis not present

## 2018-09-05 DIAGNOSIS — Z125 Encounter for screening for malignant neoplasm of prostate: Secondary | ICD-10-CM | POA: Diagnosis not present

## 2018-09-05 MED ORDER — METOPROLOL TARTRATE 25 MG PO TABS: ORAL_TABLET | ORAL | 1 refills | Status: DC

## 2018-09-05 MED ORDER — AMLODIPINE BESYLATE 5 MG PO TABS: 5.0000 mg | ORAL_TABLET | Freq: Every day | ORAL | 1 refills | Status: DC

## 2018-09-05 MED ORDER — FAMOTIDINE 40 MG PO TABS: 40.0000 mg | ORAL_TABLET | Freq: Every day | ORAL | 25 refills | Status: DC

## 2018-09-05 MED ORDER — FAMOTIDINE 40 MG PO TABS: 40.0000 mg | ORAL_TABLET | Freq: Every day | ORAL | 1 refills | Status: DC

## 2018-09-05 MED ORDER — ATORVASTATIN CALCIUM 10 MG PO TABS: 10.0000 mg | ORAL_TABLET | Freq: Every day | ORAL | 1 refills | Status: DC

## 2018-09-05 NOTE — Progress Notes (Signed)
Subjective:    Patient ID: Christopher [name], male    DOB: 10/03/1960, 58 y.o.   MRN: [id number]  Hyperlipidemia This is a chronic problem. Pertinent negatives include no chest pain or shortness of breath. Treatments tried: atorvastatin 10mg . There are no compliance problems (takes meds every day, eats healthy, exercises).   Patient feels that his blood pressure overall doing well taking his medicine on a regular basis tries to eat healthy Patient states he takes it very sparingly. Had previous knee surgery I will do a little bit of walking but not a lot of exercise  Reflux related issues.  Overall under good control Pt states no concerns today.  Virtual Visit via Telephone Note  I connected with Christopher [name] on 09/05/18 at  8:30 AM EDT by telephone and verified that I am speaking with the correct person using two identifiers.  Location: Patient: home Provider: office   I discussed the limitations, risks, security and privacy concerns of performing an evaluation and management service by telephone and the availability of in person appointments. I also discussed with the patient that there may be a patient responsible charge related to this service. The patient expressed understanding and agreed to proceed.   History of Present Illness:    Observations/Objective:   Assessment and Plan:   Follow Up Instructions:    I discussed the assessment and treatment plan with the patient. The patient was provided an opportunity to ask questions and all were answered. The patient agreed with the plan and demonstrated an understanding of the instructions.   The patient was advised to call back or seek an in-person evaluation if the symptoms worsen or if the condition fails to improve as anticipated. 15 I provided 15 minutes of non-face-to-face time during this encounter.      Review of Systems  Constitutional: Negative for diaphoresis and fatigue.  HENT: Negative for congestion and  rhinorrhea.   Respiratory: Negative for cough and shortness of breath.   Cardiovascular: Negative for chest pain and leg swelling.  Gastrointestinal: Negative for abdominal pain and diarrhea.  Skin: Negative for color change and rash.  Neurological: Negative for dizziness and headaches.  Psychiatric/Behavioral: Negative for behavioral problems and confusion.       Objective:   Physical Exam  Today's visit was via telephone Physical exam was not possible for this visit       Assessment & Plan:  HTN- Patient was seen today as part of a visit regarding hypertension. The importance of healthy diet and regular physical activity was discussed. The importance of compliance with medications discussed.  Ideal goal is to keep blood pressure low elevated levels certainly below 992/42 when possible.  The patient was counseled that keeping blood pressure under control lessen his risk of complications.  The importance of regular follow-ups was discussed with the patient.  Low-salt diet such as DASH recommended.  Regular physical activity was recommended as well.  Patient was advised to keep regular follow-ups.  The patient was seen today as part of an evaluation regarding hyperlipidemia.  Recent lab work has been reviewed with the patient as well as the goals for good cholesterol care.  In addition to this medications have been discussed the importance of compliance with diet and medications discussed as well.  Finally the patient is aware that poor control of cholesterol, noncompliance can dramatically increase the risk of complications. The patient will keep regular office visits and the patient does agreed to periodic lab work.  I would recommend for the patient to switch from Protonix to famotidine it would be safer with less risk of side effects and less risk for coronavirus

## 2018-11-24 ENCOUNTER — Encounter (HOSPITAL_COMMUNITY): Payer: Self-pay | Admitting: *Deleted

## 2018-11-24 ENCOUNTER — Emergency Department (HOSPITAL_COMMUNITY)
Admission: EM | Admit: 2018-11-24 | Discharge: 2018-11-25 | Disposition: A | Discharge status: Home / Self Care | Payer: PRIVATE HEALTH INSURANCE | Attending: Emergency Medicine | Admitting: Emergency Medicine

## 2018-11-24 ENCOUNTER — Other Ambulatory Visit: Payer: Self-pay

## 2018-11-24 DIAGNOSIS — R519 Headache, unspecified: Secondary | ICD-10-CM | POA: Insufficient documentation

## 2018-11-24 DIAGNOSIS — Z5321 Procedure and treatment not carried out due to patient leaving prior to being seen by health care provider: Secondary | ICD-10-CM | POA: Insufficient documentation

## 2018-11-24 DIAGNOSIS — R5383 Other fatigue: Secondary | ICD-10-CM | POA: Insufficient documentation

## 2018-11-24 NOTE — ED Triage Notes (Signed)
Pt with sore throat, HA and fatigue since last night. Denies any chills or fevers.  Took 200 mg Ibuprofen at 1700

## 2018-11-25 ENCOUNTER — Encounter (HOSPITAL_COMMUNITY): Payer: Self-pay | Admitting: Emergency Medicine

## 2018-11-25 ENCOUNTER — Other Ambulatory Visit: Payer: Self-pay

## 2018-11-25 ENCOUNTER — Emergency Department (HOSPITAL_COMMUNITY)
Admission: EM | Admit: 2018-11-25 | Discharge: 2018-11-25 | Disposition: A | Discharge status: Home / Self Care | Payer: Self-pay | Attending: Emergency Medicine | Admitting: Emergency Medicine

## 2018-11-25 ENCOUNTER — Emergency Department (HOSPITAL_COMMUNITY): Payer: Self-pay

## 2018-11-25 DIAGNOSIS — Z20828 Contact with and (suspected) exposure to other viral communicable diseases: Secondary | ICD-10-CM | POA: Insufficient documentation

## 2018-11-25 DIAGNOSIS — I10 Essential (primary) hypertension: Secondary | ICD-10-CM | POA: Insufficient documentation

## 2018-11-25 DIAGNOSIS — Z96651 Presence of right artificial knee joint: Secondary | ICD-10-CM | POA: Insufficient documentation

## 2018-11-25 DIAGNOSIS — R519 Headache, unspecified: Secondary | ICD-10-CM | POA: Insufficient documentation

## 2018-11-25 DIAGNOSIS — J069 Acute upper respiratory infection, unspecified: Secondary | ICD-10-CM | POA: Insufficient documentation

## 2018-11-25 DIAGNOSIS — Z79899 Other long term (current) drug therapy: Secondary | ICD-10-CM | POA: Insufficient documentation

## 2018-11-25 LAB — URINALYSIS, ROUTINE W REFLEX MICROSCOPIC
Bacteria, UA: NONE SEEN
Bilirubin Urine: NEGATIVE
Glucose, UA: NEGATIVE mg/dL
Ketones, ur: NEGATIVE mg/dL
Leukocytes,Ua: NEGATIVE
Nitrite: NEGATIVE
Protein, ur: NEGATIVE mg/dL
Specific Gravity, Urine: 1.008 (ref 1.005–1.030)
pH: 6 (ref 5.0–8.0)

## 2018-11-25 LAB — COMPREHENSIVE METABOLIC PANEL
ALT: 20 U/L (ref 0–44)
AST: 17 U/L (ref 15–41)
Albumin: 4.3 g/dL (ref 3.5–5.0)
Alkaline Phosphatase: 100 U/L (ref 38–126)
Anion gap: 7 (ref 5–15)
BUN: 15 mg/dL (ref 6–20)
CO2: 29 mmol/L (ref 22–32)
Calcium: 9.2 mg/dL (ref 8.9–10.3)
Chloride: 102 mmol/L (ref 98–111)
Creatinine, Ser: 1.04 mg/dL (ref 0.61–1.24)
GFR calc Af Amer: >60 mL/min (ref >60)
GFR calc non Af Amer: >60 mL/min (ref >60)
Glucose, Bld: 137 mg/dL — ABNORMAL HIGH (ref 70–99)
Potassium: 3.7 mmol/L (ref 3.5–5.1)
Sodium: 138 mmol/L (ref 135–145)
Total Bilirubin: 1 mg/dL (ref 0.3–1.2)
Total Protein: 7.8 g/dL (ref 6.5–8.1)

## 2018-11-25 LAB — CBC
HCT: 47.3 % (ref 39.0–52.0)
Hemoglobin: 15.9 g/dL (ref 13.0–17.0)
MCH: 30.5 pg (ref 26.0–34.0)
MCHC: 33.6 g/dL (ref 30.0–36.0)
MCV: 90.6 fL (ref 80.0–100.0)
Platelets: 220 10*3/uL (ref 150–400)
RBC: 5.22 MIL/uL (ref 4.22–5.81)
RDW: 12.2 % (ref 11.5–15.5)
WBC: 4.3 10*3/uL (ref 4.0–10.5)
nRBC: 0 % (ref 0.0–0.2)

## 2018-11-25 LAB — LIPASE, BLOOD: Lipase: 24 U/L (ref 11–51)

## 2018-11-25 MED ORDER — SODIUM CHLORIDE 0.9% FLUSH: 3.0000 mL | Freq: Once | INTRAVENOUS | Status: DC

## 2018-11-25 MED ORDER — CLONIDINE HCL 0.2 MG PO TABS
0.2000 mg | ORAL_TABLET | Freq: Once | ORAL | Status: AC
  Administered 2018-11-25: 0.2 mg via ORAL
  Filled 2018-11-25: qty 1

## 2018-11-25 NOTE — ED Provider Notes (Signed)
[facility] EMERGENCY DEPARTMENT Provider Note   CSN: [id number] Arrival date & time: 11/25/18  1259     History   Chief Complaint Chief Complaint  Patient presents with  . Abdominal Pain    HPI [name] is a 58 y.o. male.     Patient is a 58 year old gentleman with past medical history of atrial fibrillation, GERD, hypertension, hyperlipidemia presenting to the emergency department for cough with fatigue.  Reports that this is been going on for about 2 or 3 days.  Denies any known sick contacts.  Denies any abdominal pain despite triage note.  Denies any nausea, vomiting, chest pain, shortness of breath, nasal congestion or ear pain.  Reports that sometimes when he stands up too fast he does feel dizzy.  Has not tried anything for relief.     Past Medical History:  Diagnosis Date  . Arthritis   . Atrial fibrillation ([place])    a. occuring in the post-operative setting in 02/2017  . GERD (gastroesophageal reflux disease)   . HTN (hypertension)   . Hyperlipemia     Patient Active Problem List   Diagnosis Date Noted  . Special screening for malignant neoplasms, colon 11/17/2017  . Arthrofibrosis of knee joint, right s/p Manipulation 04/05/17 04/05/2017  . S/P total knee replacement, right 02/15/17 03/01/2017  . Primary osteoarthritis of both knees 10/13/2016  . Chest pain 06/06/2016  . Hyperlipemia 04/16/2013  . HTN (hypertension), benign 04/16/2013  . Impingement syndrome of right shoulder 03/08/2011    Past Surgical History:  Procedure Laterality Date  . COLONOSCOPY N/A 07/13/2018   Procedure: COLONOSCOPY;  Surgeon: Rogene Houston, MD;  Location: [facility];  Service: Endoscopy;  Laterality: N/A;  830  . EXAM UNDER ANESTHESIA WITH MANIPULATION OF KNEE Right 04/05/2017   Procedure: EXAM UNDER ANESTHESIA WITH MANIPULATION OF KNEE;  Surgeon: Carole Civil, MD;  Location: [facility] ORS;  Service: Orthopedics;  Laterality: Right;  . HERNIA REPAIR Right   . KNEE  ARTHROSCOPY  right knee  . POLYPECTOMY  07/13/2018   Procedure: POLYPECTOMY;  Surgeon: Rogene Houston, MD;  Location: [facility];  Service: Endoscopy;;  . TOTAL KNEE ARTHROPLASTY Right 02/15/2017   Procedure: TOTAL KNEE ARTHROPLASTY;  Surgeon: Carole Civil, MD;  Location: [facility] ORS;  Service: Orthopedics;  Laterality: Right;        Home Medications    Prior to Admission medications   Medication Sig Start Date End Date Taking? Authorizing Provider  amLODipine (NORVASC) 5 MG tablet Take 1 tablet (5 mg total) by mouth daily. 09/05/18   Kathyrn Drown, MD  atorvastatin (LIPITOR) 10 MG tablet Take 1 tablet (10 mg total) by mouth daily. 09/05/18   Kathyrn Drown, MD  famotidine (PEPCID) 40 MG tablet Take 1 tablet (40 mg total) by mouth daily. 09/05/18   Kathyrn Drown, MD  famotidine (PEPCID) 40 MG tablet Take 1 tablet (40 mg total) by mouth daily. 09/05/18   Kathyrn Drown, MD  metoprolol tartrate (LOPRESSOR) 25 MG tablet TAKE 1 TABLET BY MOUTH TWICE DAILY 09/05/18   Kathyrn Drown, MD    Family History Family History  Problem Relation Age of Onset  . Arthritis Other   . Cancer Other   . Diabetes Other   . Other Mother 68       sepsis  . CAD Father 72  . CAD Brother 46  . Diabetes Brother   . Colon cancer Neg Hx  Social History Social History   Tobacco Use  . Smoking status: Never Smoker  . Smokeless tobacco: Never Used  Substance Use Topics  . Alcohol use: Yes    Comment: occasional beer  . Drug use: No     Allergies   Hydrocodone   Review of Systems Review of Systems  Constitutional: Positive for fatigue. Negative for activity change, appetite change, chills, diaphoresis, fever and unexpected weight change.  HENT: Negative for congestion, postnasal drip, rhinorrhea and sore throat.   Respiratory: Positive for cough. Negative for chest tightness, shortness of breath and wheezing.   Cardiovascular: Negative for chest pain, palpitations and leg swelling.   Gastrointestinal: Negative for abdominal distention, abdominal pain, constipation, diarrhea, nausea and vomiting.  Genitourinary: Negative for dysuria.  Musculoskeletal: Negative for back pain, gait problem, joint swelling, myalgias, neck pain and neck stiffness.  Skin: Negative for rash and wound.  Allergic/Immunologic: Negative for immunocompromised state.  Neurological: Positive for dizziness. Negative for tremors, seizures, syncope, facial asymmetry, speech difficulty, weakness, light-headedness, numbness and headaches.  Hematological: Does not bruise/bleed easily.     Physical Exam Updated Vital Signs BP (!) 174/97   Pulse 87   Temp 97.7 F (36.5 C) (Oral)   Resp 18   Ht 5\' 11"  (1.803 m)   Wt 87.5 kg   SpO2 100%   BMI 26.92 kg/m   Physical Exam Vitals signs and nursing note reviewed.  Constitutional:      Appearance: Normal appearance.  HENT:     Head: Normocephalic.     Right Ear: Tympanic membrane normal.     Nose: Nose normal. No congestion.     Mouth/Throat:     Mouth: Mucous membranes are moist.  Eyes:     Conjunctiva/sclera: Conjunctivae normal.     Pupils: Pupils are equal, round, and reactive to light.  Cardiovascular:     Rate and Rhythm: Normal rate and regular rhythm.  Pulmonary:     Effort: Pulmonary effort is normal.     Breath sounds: Normal breath sounds. No wheezing, rhonchi or rales.  Chest:     Chest wall: No tenderness.  Abdominal:     General: Abdomen is flat. Bowel sounds are normal. There is no distension.     Palpations: Abdomen is soft.     Tenderness: There is no abdominal tenderness. There is no right CVA tenderness, left CVA tenderness, guarding or rebound.  Skin:    General: Skin is dry.     Capillary Refill: Capillary refill takes less than 2 seconds.  Neurological:     Mental Status: [name] alert.  Psychiatric:        Mood and Affect: Mood normal.      ED Treatments / Results  Labs (all labs ordered are listed, but only  abnormal results are displayed) Labs Reviewed  COMPREHENSIVE METABOLIC PANEL - Abnormal; Notable for the following components:      Result Value   Glucose, Bld 137 (*)    All other components within normal limits  URINALYSIS, ROUTINE W REFLEX MICROSCOPIC - Abnormal; Notable for the following components:   Color, Urine STRAW (*)    Hgb urine dipstick SMALL (*)    All other components within normal limits  SARS CORONAVIRUS 2 (TAT 6-24 HRS)  LIPASE, BLOOD  CBC    EKG EKG Interpretation  Date/Time:  Saturday November 25 2018 14:00:53 EDT Ventricular Rate:  65 PR Interval:    QRS Duration: 84 QT Interval:  387 QTC Calculation: 403 R  Axis:   93 Text Interpretation:  Sinus rhythm Borderline right axis deviation Consider left ventricular hypertrophy Minimal ST elevation, lateral leads since last tracing no significant change Confirmed by Daleen Bo [phone]) on 11/25/2018 2:44:20 PM   Radiology Dg Chest 2 View  Result Date: 11/25/2018 CLINICAL DATA:  Cough, fatigue EXAM: CHEST - 2 VIEW COMPARISON:  06/06/2016 FINDINGS: The heart size and mediastinal contours are within normal limits. Both lungs are clear. The visualized skeletal structures are unremarkable. IMPRESSION: No acute abnormality of the lungs. Electronically Signed   By: Eddie Candle M.D.   On: 11/25/2018 14:16    Procedures Procedures (including critical care time)  Medications Ordered in ED Medications  sodium chloride flush (NS) 0.9 % injection 3 mL (3 mLs Intravenous Not Given 11/25/18 1410)  cloNIDine (CATAPRES) tablet 0.2 mg (0.2 mg Oral Given 11/25/18 1520)     Initial Impression / Assessment and Plan / ED Course  I have reviewed the triage vital signs and the nursing notes.  Pertinent labs & imaging results that were available during my care of the patient were reviewed by me and considered in my medical decision making (see chart for details).  Clinical Course as of Nov 25 1599  Sat Nov 25, 2018  1453  Patient presenting with URI symptoms for 2 days. No nknown covid exposure. Cough, headache, fatigue. Appears well with good sats and tolerating PO, no n/v/d. Labs, chest xray and ekg are unremarkable. [name] not orthostatic. He does have elevated BP despite taking his meds today. Will give dose of clonidine.    [KM]  1553 BP improved w/ clonidine. Patient appears well. Counseled on bp and avoidance of salt and caffeine. I suspect patient has URI. Will swab for covid. Advised on return precautions and f/u with pmd   [KM]    Clinical Course User Index [KM] Alveria Apley, PA-C       Based on review of vitals, medical screening exam, lab work and/or imaging, there does not appear to be an acute, emergent etiology for the patient's symptoms. Counseled pt on good return precautions and encouraged both PCP and ED follow-up as needed.  Prior to discharge, I also discussed incidental imaging findings with patient in detail and advised appropriate, recommended follow-up in detail.  Clinical Impression: 1. Upper respiratory tract infection, unspecified type   2. Bad headache   3. Hypertension, unspecified type     Disposition: Discharge  Prior to providing a prescription for a controlled substance, I independently reviewed the patient's recent prescription history on the [place]. The patient had no recent or regular prescriptions and was deemed appropriate for a brief, less than 3 day prescription of narcotic for acute analgesia.  This note was prepared with assistance of Systems analyst. Occasional wrong-word or sound-a-like substitutions may have occurred due to the inherent limitations of voice recognition software.   Final Clinical Impressions(s) / ED Diagnoses   Final diagnoses:  Upper respiratory tract infection, unspecified type  Bad headache  Hypertension, unspecified type    ED Discharge Orders    None        Alveria Apley, PA-C 11/25/18 1601    Daleen Bo, MD 11/26/18 [phone]

## 2018-11-25 NOTE — ED Notes (Signed)
Pt called with no answer- not in [place]

## 2018-11-25 NOTE — ED Triage Notes (Addendum)
Patient c/o generalized abd pain with nausea and fatigue. Per patient started 2 days ago. Patient also reports dry cough and headache that started at the same time. Denies fever, diarrhea, vomiting, or urinary symptoms.

## 2018-11-25 NOTE — Discharge Instructions (Signed)
I think you may have an upper respiratory infection. This could be the common cold but you are also being tested for covid 19. Please quarantine yourself until you know your results are negative. You can take over the counter cough and cold medications for relief of your symptoms. Your blood pressure was also high today. Make sure you avoid salt in your diet and follow up with your regular doctor. Thank you for allowing me to care for you today. Please return to the emergency department if you have new or worsening symptoms. Take your medications as instructed.

## 2018-11-26 LAB — SARS CORONAVIRUS 2 (TAT 6-24 HRS): SARS Coronavirus 2: NEGATIVE

## 2018-11-28 ENCOUNTER — Telehealth: Payer: Self-pay | Admitting: Family Medicine

## 2018-11-28 NOTE — Telephone Encounter (Signed)
Patient called in and was informed of negative test results.

## 2018-12-04 ENCOUNTER — Ambulatory Visit (INDEPENDENT_AMBULATORY_CARE_PROVIDER_SITE_OTHER): Payer: Self-pay | Admitting: Family Medicine

## 2018-12-04 ENCOUNTER — Encounter: Payer: Self-pay | Admitting: Family Medicine

## 2018-12-04 ENCOUNTER — Other Ambulatory Visit: Payer: Self-pay

## 2018-12-04 VITALS — BP 138/90 | Temp 98.6°F

## 2018-12-04 DIAGNOSIS — Z125 Encounter for screening for malignant neoplasm of prostate: Secondary | ICD-10-CM

## 2018-12-04 DIAGNOSIS — E785 Hyperlipidemia, unspecified: Secondary | ICD-10-CM

## 2018-12-04 MED ORDER — METOPROLOL TARTRATE 25 MG PO TABS: ORAL_TABLET | ORAL | 1 refills | Status: DC

## 2018-12-04 MED ORDER — ATORVASTATIN CALCIUM 10 MG PO TABS: 10.0000 mg | ORAL_TABLET | Freq: Every day | ORAL | 1 refills | Status: DC

## 2018-12-04 MED ORDER — AMLODIPINE BESYLATE 10 MG PO TABS: 10.0000 mg | ORAL_TABLET | Freq: Every day | ORAL | 1 refills | Status: DC

## 2018-12-04 NOTE — Progress Notes (Signed)
   Subjective:    Patient ID: Christopher [name], male    DOB: May 26, 1960, 58 y.o.   MRN: [id number]  HPI  Blood pressure reading in the ER very elevated 170/110 Patient with some sore throat cough fatigue not feeling good Covid was negative Beginning of have been getting clonidine to get his blood pressure down Patient states that his overall energy level was subpar Patient was in the ER for elevated blood pressure also evaluation for possible Covid patient overall doing well currently Denies any Covid symptoms Blood pressure was extremely elevated but doing better now.  Review of Systems  Constitutional: Positive for fatigue. Negative for activity change, chills and fever.  HENT: Negative for congestion, ear pain and rhinorrhea.   Eyes: Negative for discharge.  Respiratory: Negative for cough and wheezing.   Cardiovascular: Negative for chest pain.  Gastrointestinal: Negative for nausea and vomiting.  Musculoskeletal: Negative for arthralgias.       Objective:   Physical Exam Vitals signs reviewed.  Constitutional:      General: [name] not in acute distress. HENT:     Head: Normocephalic and atraumatic.  Eyes:     General:        Right eye: No discharge.        Left eye: No discharge.  Neck:     Trachea: No tracheal deviation.  Cardiovascular:     Rate and Rhythm: Normal rate and regular rhythm.     Heart sounds: Normal heart sounds. No murmur.  Pulmonary:     Effort: Pulmonary effort is normal. No respiratory distress.     Breath sounds: Normal breath sounds.  Lymphadenopathy:     Cervical: No cervical adenopathy.  Skin:    General: Skin is warm and dry.  Neurological:     Mental Status: [name] alert.     Coordination: Coordination normal.  Psychiatric:        Behavior: Behavior normal.           Assessment & Plan:  Blood pressure still not at goal I recommend increasing the amlodipine to 10 mg daily Continue everything else as is Watch diet closely stay  physically active Covid precautions discussed as well.  Follow-up if progressive troubles Patient will do lab work

## 2018-12-04 NOTE — Patient Instructions (Signed)
Christopher [name] Your blood pressure is mildly elevated.  Ideally we would like to see your top number in the 130s and your bottom number in the 80s or less.  Therefore we did increase the dose of your amlodipine.  New dose is 10 mg daily.  Please follow-up by spring[place].  TakeCare-Dr. Nicki Reaper      DASH Eating Plan DASH stands for "Dietary Approaches to Stop Hypertension." The DASH eating plan is a healthy eating plan that has been shown to reduce high blood pressure (hypertension). It may also reduce your risk for type 2 diabetes, heart disease, and stroke. The DASH eating plan may also help with weight loss. What are tips for following this plan?  General guidelines  Avoid eating more than 2,300 mg (milligrams) of salt (sodium) a day. If you have hypertension, you may need to reduce your sodium intake to 1,500 mg a day.  Limit alcohol intake to no more than 1 drink a day for nonpregnant women and 2 drinks a day for men. One drink equals 12 oz of beer, 5 oz of wine, or 1 oz of hard liquor.  Work with your health care provider to maintain a healthy body weight or to lose weight. Ask what an ideal weight is for you.  Get at least 30 minutes of exercise that causes your heart to beat faster (aerobic exercise) most days of the week. Activities may include walking, swimming, or biking.  Work with your health care provider or diet and nutrition specialist (dietitian) to adjust your eating plan to your individual calorie needs. Reading food labels   Check food labels for the amount of sodium per serving. Choose foods with less than 5 percent of the Daily Value of sodium. Generally, foods with less than 300 mg of sodium per serving fit into this eating plan.  To find whole grains, look for the word "whole" as the first word in the ingredient list. Shopping  Buy products labeled as "low-sodium" or "no salt added."  Buy fresh foods. Avoid canned foods and premade or frozen meals. Cooking  Avoid adding  salt when cooking. Use salt-free seasonings or herbs instead of table salt or sea salt. Check with your health care provider or pharmacist before using salt substitutes.  Do not fry foods. Cook foods using healthy methods such as baking, boiling, grilling, and broiling instead.  Cook with heart-healthy oils, such as olive, canola, soybean, or sunflower oil. Meal planning  Eat a balanced diet that includes: ? 5 or more servings of fruits and vegetables each day. At each meal, try to fill half of your plate with fruits and vegetables. ? Up to 6-8 servings of whole grains each day. ? Less than 6 oz of lean meat, poultry, or fish each day. A 3-oz serving of meat is about the same size as a deck of cards. One egg equals 1 oz. ? 2 servings of low-fat dairy each day. ? A serving of nuts, seeds, or beans 5 times each week. ? Heart-healthy fats. Healthy fats called Omega-3 fatty acids are found in foods such as flaxseeds and coldwater fish, like sardines, salmon, and mackerel.  Limit how much you eat of the following: ? Canned or prepackaged foods. ? Food that is high in trans fat, such as fried foods. ? Food that is high in saturated fat, such as fatty meat. ? Sweets, desserts, sugary drinks, and other foods with added sugar. ? Full-fat dairy products.  Do not salt foods before eating.  Try  to eat at least 2 vegetarian meals each week.  Eat more home-cooked food and less restaurant, buffet, and fast food.  When eating at a restaurant, ask that your food be prepared with less salt or no salt, if possible. What foods are recommended? The items listed may not be a complete list. Talk with your dietitian about what dietary choices are best for you. Grains Whole-grain or whole-wheat bread. Whole-grain or whole-wheat pasta. Brown rice. [place]. Bulgur. Whole-grain and low-sodium cereals. Pita bread. Low-fat, low-sodium crackers. Whole-wheat flour tortillas. Vegetables Fresh or frozen  vegetables (raw, steamed, roasted, or grilled). Low-sodium or reduced-sodium tomato and vegetable juice. Low-sodium or reduced-sodium tomato sauce and tomato paste. Low-sodium or reduced-sodium canned vegetables. Fruits All fresh, dried, or frozen fruit. Canned fruit in natural juice (without added sugar). Meat and other protein foods Skinless chicken or [place]. Ground chicken or [place]. Pork with fat trimmed off. Fish and seafood. Egg whites. Dried beans, peas, or lentils. Unsalted nuts, nut butters, and seeds. Unsalted canned beans. Lean cuts of beef with fat trimmed off. Low-sodium, lean deli meat. Dairy Low-fat (1%) or fat-free (skim) milk. Fat-free, low-fat, or reduced-fat cheeses. Nonfat, low-sodium ricotta or cottage cheese. Low-fat or nonfat yogurt. Low-fat, low-sodium cheese. Fats and oils Soft margarine without trans fats. Vegetable oil. Low-fat, reduced-fat, or light mayonnaise and salad dressings (reduced-sodium). Canola, safflower, olive, soybean, and sunflower oils. Avocado. Seasoning and other foods Herbs. Spices. Seasoning mixes without salt. Unsalted popcorn and pretzels. Fat-free sweets. What foods are not recommended? The items listed may not be a complete list. Talk with your dietitian about what dietary choices are best for you. Grains Baked goods made with fat, such as croissants, muffins, or some breads. Dry pasta or rice meal packs. Vegetables Creamed or fried vegetables. Vegetables in a cheese sauce. Regular canned vegetables (not low-sodium or reduced-sodium). Regular canned tomato sauce and paste (not low-sodium or reduced-sodium). Regular tomato and vegetable juice (not low-sodium or reduced-sodium). Angie Fava. Olives. Fruits Canned fruit in a light or heavy syrup. Fried fruit. Fruit in cream or butter sauce. Meat and other protein foods Fatty cuts of meat. Ribs. Fried meat. Berniece Salines. Sausage. Bologna and other processed lunch meats. Salami. Fatback. Hotdogs. Bratwurst.  Salted nuts and seeds. Canned beans with added salt. Canned or smoked fish. Whole eggs or egg yolks. Chicken or [place] with skin. Dairy Whole or 2% milk, cream, and half-and-half. Whole or full-fat cream cheese. Whole-fat or sweetened yogurt. Full-fat cheese. Nondairy creamers. Whipped toppings. Processed cheese and cheese spreads. Fats and oils Butter. Stick margarine. Lard. Shortening. Ghee. Bacon fat. Tropical oils, such as coconut, palm kernel, or palm oil. Seasoning and other foods Salted popcorn and pretzels. Onion salt, garlic salt, seasoned salt, table salt, and sea salt. Worcestershire sauce. Tartar sauce. Barbecue sauce. Teriyaki sauce. Soy sauce, including reduced-sodium. Steak sauce. Canned and packaged gravies. Fish sauce. Oyster sauce. Cocktail sauce. Horseradish that you find on the shelf. Ketchup. Mustard. Meat flavorings and tenderizers. Bouillon cubes. Hot sauce and Tabasco sauce. Premade or packaged marinades. Premade or packaged taco seasonings. Relishes. Regular salad dressings. Where to find more information:  National Heart, Lung, and [place]: https://wilson-eaton.com/  American Heart Association: www.heart.org Summary  The DASH eating plan is a healthy eating plan that has been shown to reduce high blood pressure (hypertension). It may also reduce your risk for type 2 diabetes, heart disease, and stroke.  With the DASH eating plan, you should limit salt (sodium) intake to 2,300 mg a day. If you  have hypertension, you may need to reduce your sodium intake to 1,500 mg a day.  When on the DASH eating plan, aim to eat more fresh fruits and vegetables, whole grains, lean proteins, low-fat dairy, and heart-healthy fats.  Work with your health care provider or diet and nutrition specialist (dietitian) to adjust your eating plan to your individual calorie needs. This information is not intended to replace advice given to you by your health care provider. Make sure you discuss any  questions you have with your health care provider. Document Released: 01/07/2011 Document Revised: 12/31/2016 Document Reviewed: 01/12/2016 Elsevier Patient Education  2020 [employer].

## 2018-12-13 ENCOUNTER — Other Ambulatory Visit: Payer: Self-pay

## 2018-12-13 DIAGNOSIS — Z20822 Contact with and (suspected) exposure to covid-19: Secondary | ICD-10-CM

## 2018-12-14 ENCOUNTER — Telehealth: Payer: Self-pay | Admitting: *Deleted

## 2018-12-14 NOTE — Telephone Encounter (Signed)
Pt called for result of COVID test obtained 12/13/2018; explained turn around time is based on the number of tests that must be run; explained to pt that results in available first in [place], and he will also receive a call with results; explained to pt the importance of answering calls; he would also like for the link to MyChart sent via text to [phone]; link sent per request.

## 2018-12-15 LAB — NOVEL CORONAVIRUS, NAA: SARS-CoV-2, NAA: NOT DETECTED

## 2019-03-02 ENCOUNTER — Ambulatory Visit: Payer: Medicaid Other | Admitting: Orthopedic Surgery

## 2019-03-20 ENCOUNTER — Telehealth: Payer: Self-pay

## 2019-03-20 NOTE — Telephone Encounter (Signed)
Virtual Visit Pre-Appointment Phone Call  "(Name), I am calling you today to discuss your upcoming appointment. We are currently trying to limit exposure to the virus that causes COVID-19 by seeing patients at home rather than in the office."  1. "What is the BEST phone number to call the day of the visit?" - include this in appointment notes  2. "Do you have or have access to (through a family member/friend) a smartphone with video capability that we can use for your visit?" a. If yes - list this number in appt notes as "cell" (if different from BEST phone #) and list the appointment type as a VIDEO visit in appointment notes b. If no - list the appointment type as a PHONE visit in appointment notes  Confirm consent - "In the setting of the current Covid19 crisis, you are scheduled for a (phone or video) visit with your provider on (date) at (time).  Just as we do with many in-office visits, in order for you to participate in this visit, we must obtain consent.  If you'd like, I can send this to your mychart (if signed up) or email for you to review.  Otherwise, I can obtain your verbal consent now.  All virtual visits are billed to your insurance company just like a normal visit would be.  By agreeing to a virtual visit, we'd like you to understand that the technology does not allow for your provider to perform an examination, and thus may limit your provider's ability to fully assess your condition. If your provider identifies any concerns that need to be evaluated in person, we will make arrangements to do so.  Finally, though the technology is pretty good, we cannot assure that it will always work on either your or our end, and in the setting of a video visit, we may have to convert it to a phone-only visit.  In either situation, we cannot ensure that we have a secure connection.  Are you willing to proceed?" STAFF: Did the patient verbally acknowledge consent to telehealth visit? Document  YES/NO here:  3. Advise patient to be prepared - "Two hours prior to your appointment, go ahead and check your blood pressure, pulse, oxygen saturation, and your weight (if you have the equipment to check those) and write them all down. When your visit starts, your provider will ask you for this information. If you have an Apple Watch or Kardia device, please plan to have heart rate information ready on the day of your appointment. Please have a pen and paper handy nearby the day of the visit as well."  4. Give patient instructions for MyChart download to smartphone OR Doximity/Doxy.me as below if video visit (depending on what platform provider is using)  5. Inform patient they will receive a phone call 15 minutes prior to their appointment time (may be from unknown caller ID) so they should be prepared to answer    TELEPHONE CALL NOTE  JACKOB VANANTWERP has been deemed a candidate for a follow-up tele-health visit to limit community exposure during the Covid-19 pandemic. I spoke with the patient via phone to ensure availability of phone/video source, confirm preferred email & phone number, and discuss instructions and expectations.  I reminded Jayvaughn Knippenberg Ramaker to be prepared with any vital sign and/or heart rhythm information that could potentially be obtained via home monitoring, at the time of his visit. I reminded PIUS BULSON to expect a phone call prior to his visit.  Dorothey Baseman 03/20/2019 5:36 PM   INSTRUCTIONS FOR DOWNLOADING THE MYCHART APP TO SMARTPHONE  - The patient must first make sure to have activated MyChart and know their login information - If Apple, go to App Store and type in MyChart in the search bar and download the app. If Android, ask patient to go to [employer] and type in [place] in the search bar and download the app. The app is free but as with any other app downloads, their phone may require them to verify saved payment information or Apple/Android  password.  - The patient will need to then log into the app with their MyChart username and password, and select [facility] as their healthcare provider to link the account. When it is time for your visit, go to the MyChart app, find appointments, and click Begin Video Visit. Be sure to Select Allow for your device to access the Microphone and Camera for your visit. You will then be connected, and your provider will be with you shortly.  **If they have any issues connecting, or need assistance please contact MyChart service desk [phone])**  **If using a computer, in order to ensure the best quality for their visit they will need to use either of the following Internet Browsers: [employer], or Google Chrome**  IF USING DOXIMITY or DOXY.ME - The patient will receive a link just prior to their visit by text.     FULL LENGTH CONSENT FOR TELE-HEALTH VISIT   I hereby voluntarily request, consent and authorize [facility] and its employed or contracted physicians, physician assistants, nurse practitioners or other licensed health care professionals (the Practitioner), to provide me with telemedicine health care services (the "Services") as deemed necessary by the treating Practitioner. I acknowledge and consent to receive the Services by the Practitioner via telemedicine. I understand that the telemedicine visit will involve communicating with the Practitioner through live audiovisual communication technology and the disclosure of certain medical information by electronic transmission. I acknowledge that I have been given the opportunity to request an in-person assessment or other available alternative prior to the telemedicine visit and am voluntarily participating in the telemedicine visit.  I understand that I have the right to withhold or withdraw my consent to the use of telemedicine in the course of my care at any time, without affecting my right to future care or treatment,  and that the Practitioner or I may terminate the telemedicine visit at any time. I understand that I have the right to inspect all information obtained and/or recorded in the course of the telemedicine visit and may receive copies of available information for a reasonable fee.  I understand that some of the potential risks of receiving the Services via telemedicine include:  [name] Delay or interruption in medical evaluation due to technological equipment failure or disruption; . Information transmitted may not be sufficient (e.g. poor resolution of images) to allow for appropriate medical decision making by the Practitioner; and/or  . In rare instances, security protocols could fail, causing a breach of personal health information.  Furthermore, I acknowledge that it is my responsibility to provide information about my medical history, conditions and care that is complete and accurate to the best of my ability. I acknowledge that Practitioner's advice, recommendations, and/or decision may be based on factors not within their control, such as incomplete or inaccurate data provided by me or distortions of diagnostic images or specimens that may result from electronic transmissions. I understand that the practice  of medicine is not an Chief Strategy Officer and that Practitioner makes no warranties or guarantees regarding treatment outcomes. I acknowledge that I will receive a copy of this consent concurrently upon execution via email to the email address I last provided but may also request a printed copy by calling the office of [facility].    I understand that my insurance will be billed for this visit.   I have read or had this consent read to me. . I understand the contents of this consent, which adequately explains the benefits and risks of the Services being provided via telemedicine.  . I have been provided ample opportunity to ask questions regarding this consent and the Services and have had my questions  answered to my satisfaction. . I give my informed consent for the services to be provided through the use of telemedicine in my medical care  By participating in this telemedicine visit I agree to the above.

## 2019-03-21 ENCOUNTER — Encounter: Payer: Self-pay | Admitting: Cardiology

## 2019-03-21 ENCOUNTER — Telehealth (INDEPENDENT_AMBULATORY_CARE_PROVIDER_SITE_OTHER): Payer: Medicaid Other | Admitting: Cardiology

## 2019-03-21 VITALS — Ht 71.0 in | Wt 194.0 lb

## 2019-03-21 DIAGNOSIS — R0789 Other chest pain: Secondary | ICD-10-CM

## 2019-03-21 DIAGNOSIS — I4891 Unspecified atrial fibrillation: Secondary | ICD-10-CM

## 2019-03-21 NOTE — Progress Notes (Signed)
Virtual Visit via Telephone Note   This visit type was conducted due to national recommendations for restrictions regarding the COVID-19 Pandemic (e.g. social distancing) in an effort to limit this patient's exposure and mitigate transmission in our community.  Due to his co-morbid illnesses, this patient is at least at moderate risk for complications without adequate follow up.  This format is felt to be most appropriate for this patient at this time.  The patient did not have access to video technology/had technical difficulties with video requiring transitioning to audio format only (telephone).  All issues noted in this document were discussed and addressed.  No physical exam could be performed with this format.  Please refer to the patient's chart for his  consent to telehealth for [facility].   Date:  03/21/2019   ID:  Christopher [name], DOB [birth year], MRN [id number]  Patient Location: Home Provider Location: Office  PCP:  Kathyrn Drown, MD  Cardiologist:  Carlyle Dolly, MD  Electrophysiologist:  None   Evaluation Performed:  Follow-Up Visit  Chief Complaint:  Follow up  History of Present Illness:    Christopher [name] is a 59 y.o. male seen today for follow up of the following medical problems.   1. Chest pain  - previous ER visit 04/2013 with chest pain, thought to be non-cardiac  - Trop neg, EKG with NSR and no acute changes. CXR no acute process  CAD: HTN, HL, father MI 1s.  - denies any recent symptoms since our last visit - he completed a 05/2013 GXT that was negative for ischemia, with excellent functional capacity. Echo did not show any significant pathology.  06/2016 cardiac CT calcium score of 0 -  GERD like symptoms, pcp has started him on PPI just yesterday  - no significant chest pain since last visit  2. Afib - noted during Jan 2019 admission after knee replacement - CHADS2Vasc score 1, was not started on anticoag. Lopressor increased for rate control,  converted back to SR.   - no recent palpitations. He had stopped lopressor last visit for unclear reasons, without symptoms we have not restarted   The patient does not have symptoms concerning for COVID-19 infection (fever, chills, cough, or new shortness of breath).    Past Medical History:  Diagnosis Date  . Arthritis   . Atrial fibrillation ([place])    a. occuring in the post-operative setting in 02/2017  . GERD (gastroesophageal reflux disease)   . HTN (hypertension)   . Hyperlipemia    Past Surgical History:  Procedure Laterality Date  . COLONOSCOPY N/A 07/13/2018   Procedure: COLONOSCOPY;  Surgeon: Rogene Houston, MD;  Location: [facility];  Service: Endoscopy;  Laterality: N/A;  830  . EXAM UNDER ANESTHESIA WITH MANIPULATION OF KNEE Right 04/05/2017   Procedure: EXAM UNDER ANESTHESIA WITH MANIPULATION OF KNEE;  Surgeon: Carole Civil, MD;  Location: [facility] ORS;  Service: Orthopedics;  Laterality: Right;  . HERNIA REPAIR Right   . KNEE ARTHROSCOPY  right knee  . POLYPECTOMY  07/13/2018   Procedure: POLYPECTOMY;  Surgeon: Rogene Houston, MD;  Location: [facility];  Service: Endoscopy;;  . TOTAL KNEE ARTHROPLASTY Right 02/15/2017   Procedure: TOTAL KNEE ARTHROPLASTY;  Surgeon: Carole Civil, MD;  Location: [facility];  Service: Orthopedics;  Laterality: Right;     Current Meds  Medication Sig  . amLODipine (NORVASC) 10 MG tablet Take 1 tablet (10 mg total) by mouth daily.  [name] atorvastatin (LIPITOR) 10  MG tablet Take 1 tablet (10 mg total) by mouth daily.  . famotidine (PEPCID) 40 MG tablet Take 1 tablet (40 mg total) by mouth daily.  . [DISCONTINUED] famotidine (PEPCID) 40 MG tablet Take 1 tablet (40 mg total) by mouth daily.  . [DISCONTINUED] metoprolol tartrate (LOPRESSOR) 25 MG tablet TAKE 1 TABLET BY MOUTH TWICE DAILY     Allergies:   Hydrocodone   Social History   Tobacco Use  . Smoking status: Never Smoker  . Smokeless tobacco: Never Used  Substance Use  Topics  . Alcohol use: Yes    Comment: occasional beer  . Drug use: No     Family Hx: The patient's family history includes Arthritis in an other family member; CAD (age of onset: 90) in his brother; CAD (age of onset: 20) in his father; Cancer in an other family member; Diabetes in his brother and another family member; Other (age of onset: 63) in his mother. There is no history of Colon cancer.  ROS:   Please see the history of present illness.     All other systems reviewed and are negative.   Prior CV studies:   The following studies were reviewed today:  05/23/13 GXT Attending Note  Patient exercised according to the Bruce protocol for 12 minutes achieving a workload of 13.4 METs. Heart increased from 47 bpm up to 150 bpm (88% of THR). Blood pressure increased from 146/80 up to 220/85. The test was stopped due to fatigue, the patient did not experience any chest pain.  Baseline EKG showed NSR. There were no ischemic EKG changes and no significant arrhythmias during stress.  Conclusions  1. Negative exercise stress test for ischemia  2. Duke treadmill score of 12, consistent with low risk for major cardiac events  3. Excellent exercise functional capacity (140% of predicted based on age and gender)  05/2013 Carotid US IMPRESSION: 1. Extensive bilateral intimal thickening but no focal plaque accumulation or stenosis.  05/2013 Echo Study Conclusions  - Left ventricle: The cavity size was normal. Wall thickness was increased in a pattern of mild LVH. Systolic function was normal. The estimated ejection fraction was in the range of 55% to 60%. Wall motion was normal; there were no regional wall motion abnormalities. Left ventricular diastolic function parameters were normal. - Mitral valve: Trivial regurgitation. - Right ventricle: The cavity size was mildly dilated. Systolic function was normal. - Right atrium: The atrium was mildly dilated. Central venous pressure: 33mm  Hg (est). - Atrial septum: No defect or patent foramen ovale was identified. - Tricuspid valve: Trivial regurgitation. - Pulmonic valve: Mild regurgitation. - Pulmonary arteries: PA peak pressure: 26mm Hg (S). - Pericardium, extracardiac: There was no pericardial effusion. Impressions:  - Normal LV chanber size with mild LVH and LVEF 55-60%. Normal diastolic function. Mildly dilated RV with normal contraction, mild right atrial enlargement. No obvious PFO or ASD. Normal PASP 29 mmHg. Transthoracic echocardiography. M-mode, complete   06/2016 Cardiac CT FINDINGS: Non-cardiac: See separate report from [facility]. No significant findings on limited lung and soft tissue windows.  Calcium Score: 0  Coronary Arteries: Right dominant with no anomalies  LM: Normal  LAD: Normal  D1: Normal  D2: Normal  Circumflex: Normal  OM1: Normal  AV groove normal  RCA: Normal  PDA: Normal  PLA: Normal  IMPRESSION: Normal right dominant coronary arteries  Calcium Score of 0  Normal aortic root 2.8 cm  No proximal pulmonary emboli   Labs/Other Tests and Data Reviewed:  EKG:  No ECG reviewed.  Recent Labs: 11/25/2018: ALT 20; BUN 15; Creatinine, Ser 1.04; Hemoglobin 15.9; Platelets 220; Potassium 3.7; Sodium 138   Recent Lipid Panel Lab Results  Component Value Date/Time   CHOL 207 (H) 11/09/2017 12:03 PM   TRIG 99 11/09/2017 12:03 PM   HDL 54 11/09/2017 12:03 PM   CHOLHDL 3.8 11/09/2017 12:03 PM   CHOLHDL 3.0 06/07/2016 04:40 AM   LDLCALC 133 (H) 11/09/2017 12:03 PM    Wt Readings from Last 3 Encounters:  03/21/19 194 lb (88 kg)  11/25/18 193 lb (87.5 kg)  07/13/18 193 lb (87.5 kg)     Objective:    Vital Signs:  Ht 5\' 11"  (1.803 m)   Wt 194 lb (88 kg)   BMI 27.06 kg/m    Normal affect. Normal speech pattern and tone. Comfortable, no apparent distress. No audible signs of SOB or wheezing.   ASSESSMENT & PLAN:    1.  Chest pain  - negative cardiac evaluations in the past with normal GXT as well as cardiac CT score of 0.  -no recent symptoms, continue to monitor.   2. Afib - initially detected postop knee surgery - he stopped his lopressor, he is not sure what happened to it - no recent symptoms, have not restarted beta blocker - low CHADS2Vasc score of 1, has not required anticoag     F/u 1 year  COVID-19 Education: The signs and symptoms of COVID-19 were discussed with the patient and how to seek care for testing (follow up with PCP or arrange E-visit).  The importance of social distancing was discussed today.  Time:   Today, I have spent 22 minutes with the patient with telehealth technology discussing the above problems.     Medication Adjustments/Labs and Tests Ordered: Current medicines are reviewed at length with the patient today.  Concerns regarding medicines are outlined above.   Tests Ordered: No orders of the defined types were placed in this encounter.   Medication Changes: No orders of the defined types were placed in this encounter.   Follow Up:  In Person in 1 year(s)  Signed, Carlyle Dolly, MD  03/21/2019 11:31 AM    [place]

## 2019-03-21 NOTE — Patient Instructions (Signed)

## 2019-04-26 ENCOUNTER — Ambulatory Visit: Payer: Medicaid Other

## 2019-04-27 ENCOUNTER — Ambulatory Visit: Payer: Medicaid Other | Attending: Internal Medicine

## 2019-04-27 DIAGNOSIS — Z23 Encounter for immunization: Secondary | ICD-10-CM

## 2019-04-27 NOTE — Progress Notes (Signed)
   Covid-19 Vaccination Clinic  Name:  Christopher [name]    MRN: [id number] DOB: 05/01/1960  04/27/2019  Christopher [name] was observed post Covid-19 immunization for 15 minutes without incident. He was provided with Vaccine Information Sheet and instruction to access the V-Safe system.   Christopher [name] was instructed to call 911 with any severe reactions post vaccine: [name] Difficulty breathing  . Swelling of face and throat  . A fast heartbeat  . A bad rash all over body  . Dizziness and weakness   Immunizations Administered    Name Date Dose VIS Date Route   Moderna COVID-19 Vaccine 04/27/2019  9:38 AM 0.5 mL 01/02/2019 Intramuscular   Manufacturer: Moderna   Lot: [id number]   [id number]

## 2019-05-01 IMAGING — CT CT HEART MORPH/PULM VEIN W/ CM & W/O CA SCORE
1 of 6 series · 13 of 20 positions shown, 17 images · non-contrast
Comparison: None.

CLINICAL DATA: Chest pain

EXAM:
Cardiac CTA
MEDICATIONS:
Sub lingual nitro. 4mg
TECHNIQUE: The patient was scanned on a Siemens [REDACTED]ice scanner. Gantry
rotation speed was 300 msecs. Collimation was..6 mm . A 120 kV
retrospective scan was triggered in the acending thoracic aorta at
120 HU's full mA was used for 40-80% of the R-R cycle average HR
during the scan was 50 bpm. The 3D data set was interpreted on a
dedicated work station using MPR, MIP and VRT modes. A total of 80cc
of contrast was used.

[Series 12: corcta 0.6 i26f 2 40 - 80 % · axial · 0.39mm/px · z∈[+1417,+1550]mm · 13 of 3483 slices shown, 17 images]
[im 249/3483  vessel]
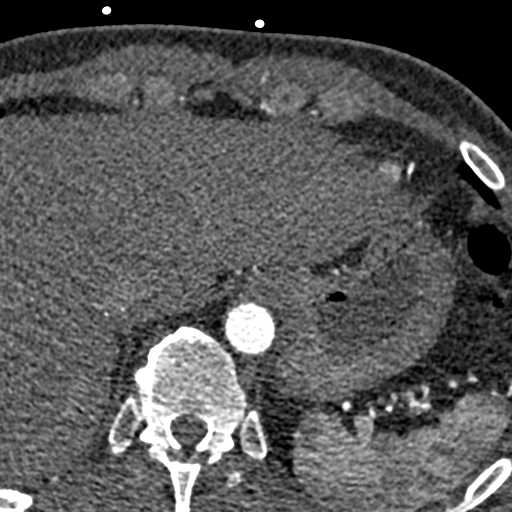
[im 249/3483  lung]
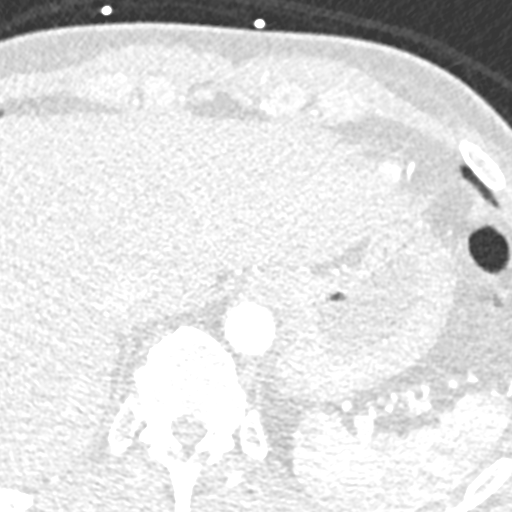
[im 498/3483  vessel]
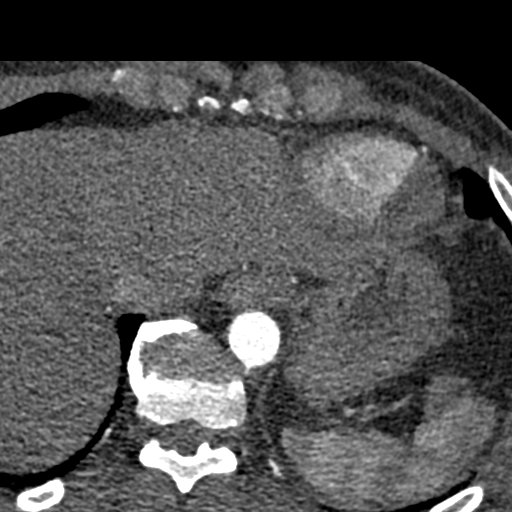
[im 747/3483  vessel]
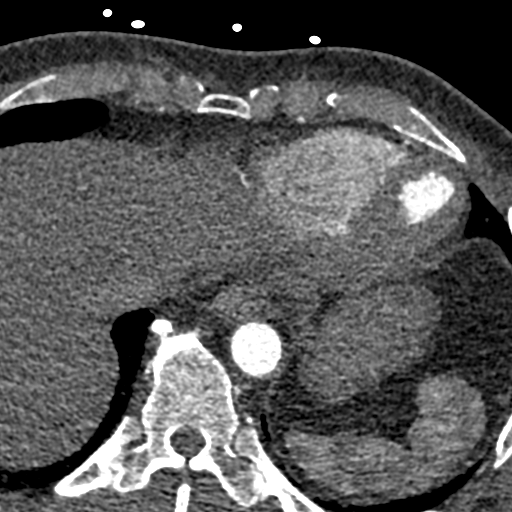
[im 995/3483  vessel]
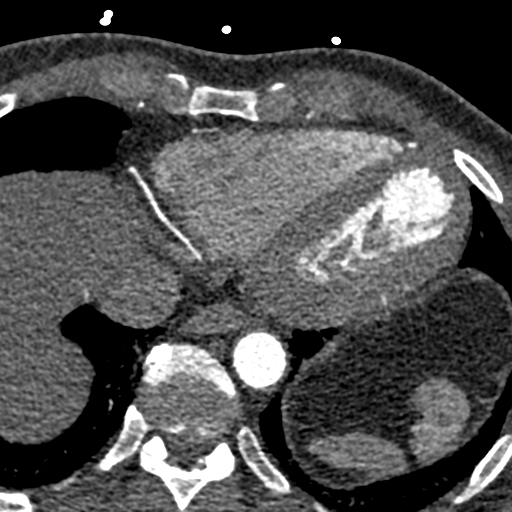
[im 1244/3483  vessel]
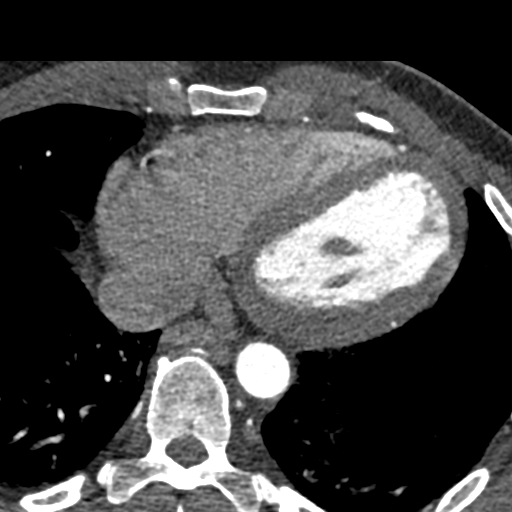
[im 1244/3483  lung]
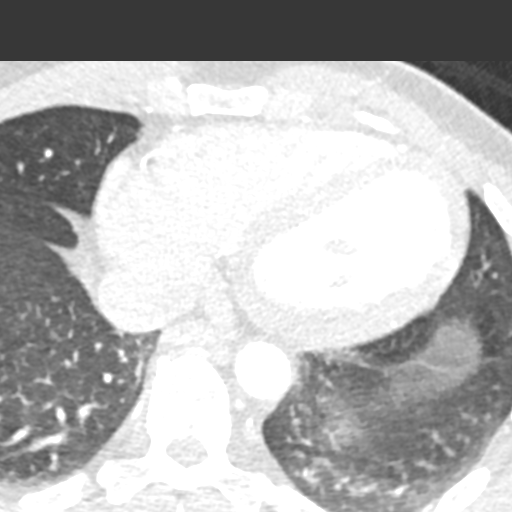
[im 1493/3483  vessel]
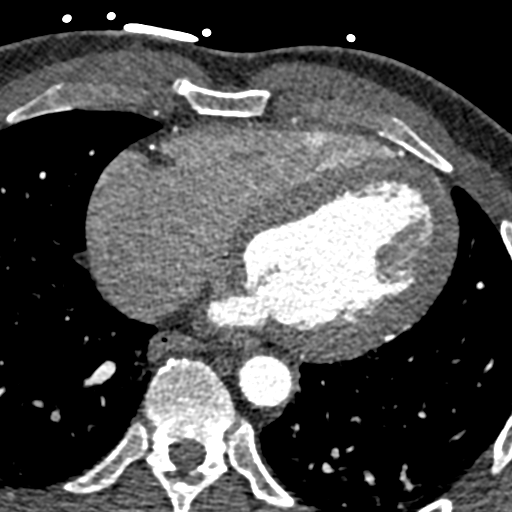
[im 1742/3483  vessel]
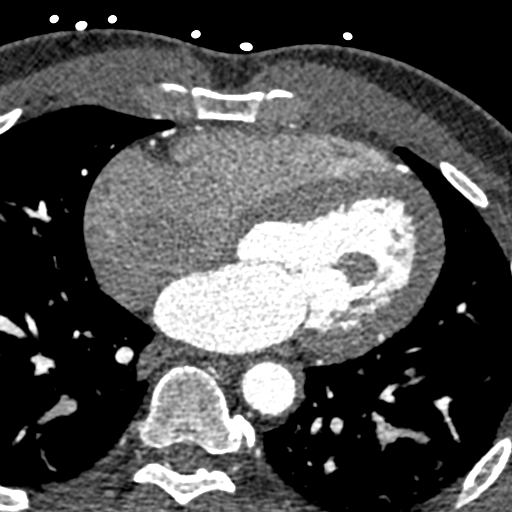
[im 1990/3483  vessel]
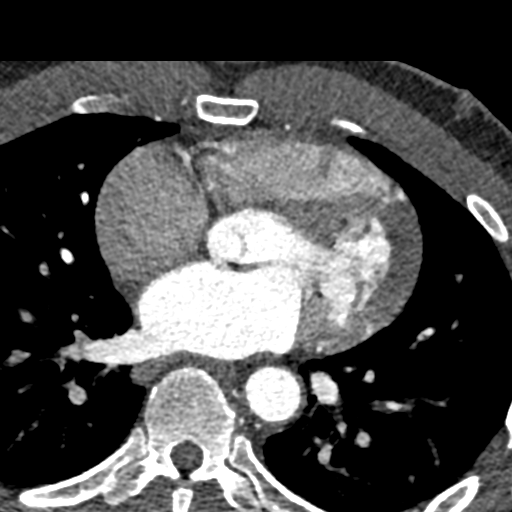
[im 2239/3483  vessel]
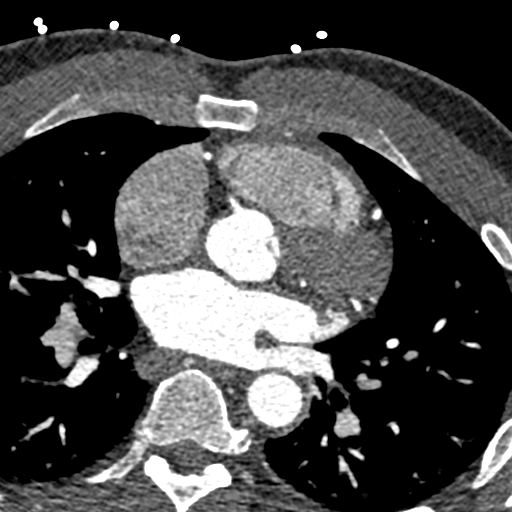
[im 2239/3483  lung]
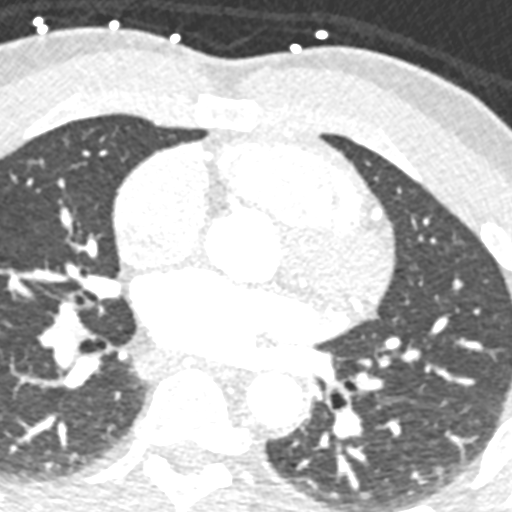
[im 2488/3483  vessel]
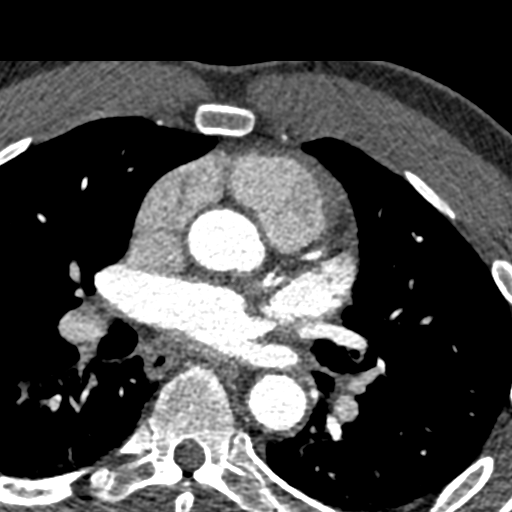
[im 2736/3483  vessel]
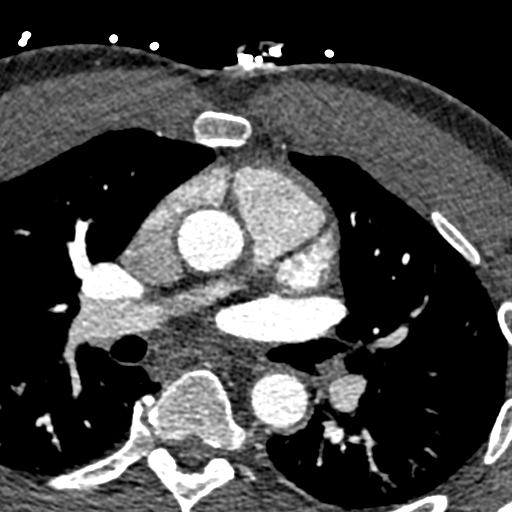
[im 2985/3483  vessel]
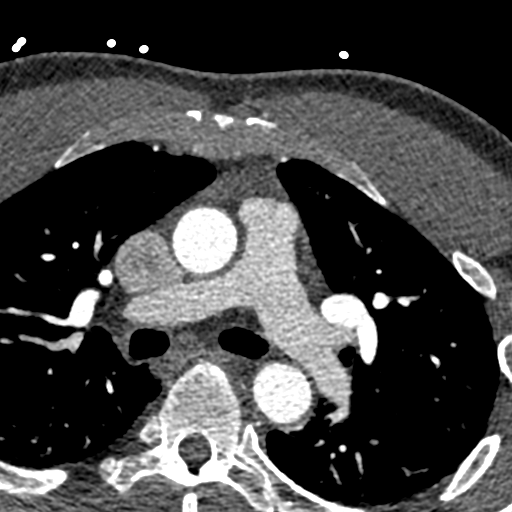
[im 3234/3483  vessel]
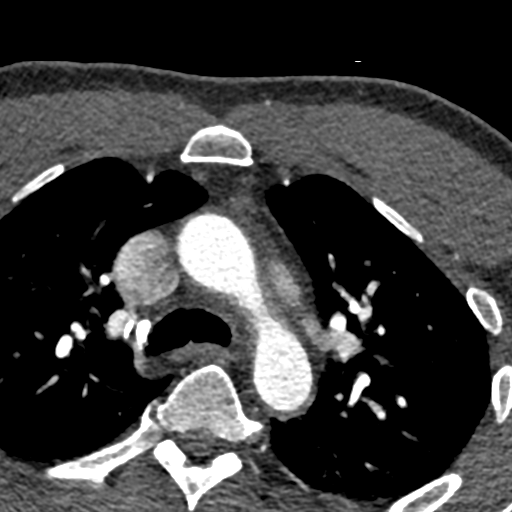
[im 3234/3483  lung]
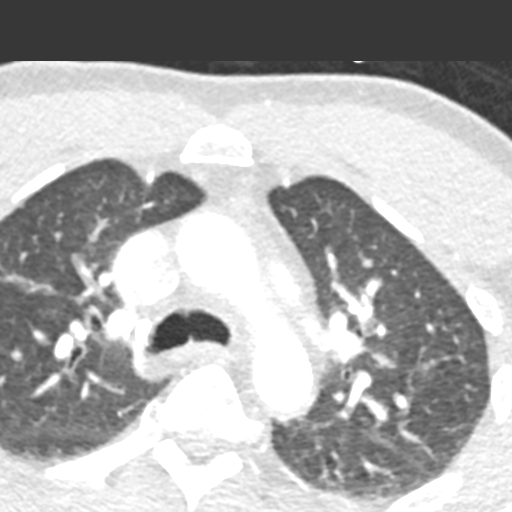

[13 of 20 positions shown; findings below may reference images not displayed]

FINDINGS: Non-cardiac: See separate report from [REDACTED]. No
significant findings on limited lung and soft tissue windows.

Calcium Score:  0

Coronary Arteries: Right dominant with no anomalies

LM:  Normal

LAD:  Normal

D1: Normal

D2: Normal

Circumflex: Normal

OM1:  Normal

AV groove normal

RCA:  Normal

PDA: Normal

PLA:  Normal
IMPRESSION: Normal right dominant coronary arteries

Calcium Score of 0

Normal aortic root 2.8 cm

No proximal pulmonary emboli

Oscar Ney Morelli

EXAM:
OVER-READ INTERPRETATION  CT CHEST

The following report is an over-read performed by radiologist Dr.
Vaquero Pillay [REDACTED] on 06/07/2016. This over-read
does not include interpretation of cardiac or coronary anatomy or
pathology. The coronary CTA interpretation by the cardiologist is
attached.
FINDINGS: Cardiovascular: Heart is borderline in size. Visualized aorta is
normal caliber.

Mediastinum/Nodes: No adenopathy in the lower mediastinum or hila.

Lungs/Pleura: Visualized lungs clear.

Upper Abdomen: Imaging into the upper abdomen shows no acute
findings.

Musculoskeletal: No chest wall mass or suspicious bone lesions
identified.
IMPRESSION: No acute or significant extracardiac abnormality.

## 2019-05-18 ENCOUNTER — Other Ambulatory Visit: Payer: Self-pay | Admitting: *Deleted

## 2019-05-18 MED ORDER — ATORVASTATIN CALCIUM 10 MG PO TABS: 10.0000 mg | ORAL_TABLET | Freq: Every day | ORAL | 0 refills | Status: DC

## 2019-05-26 ENCOUNTER — Other Ambulatory Visit: Payer: Self-pay | Admitting: Family Medicine

## 2019-05-30 ENCOUNTER — Ambulatory Visit: Payer: Medicare HMO | Attending: Internal Medicine

## 2019-05-30 DIAGNOSIS — Z23 Encounter for immunization: Secondary | ICD-10-CM

## 2019-05-30 NOTE — Progress Notes (Signed)
   Covid-19 Vaccination Clinic  Name:  Christopher [name]    MRN: [id number] DOB: 21-Jun-1960  05/30/2019  Mr. Catapano was observed post Covid-19 immunization for 15 minutes without incident. He was provided with Vaccine Information Sheet and instruction to access the V-Safe system.   Mr. Biggs was instructed to call 911 with any severe reactions post vaccine: [name] Difficulty breathing  . Swelling of face and throat  . A fast heartbeat  . A bad rash all over body  . Dizziness and weakness   Immunizations Administered    Name Date Dose VIS Date Route   Moderna COVID-19 Vaccine 05/30/2019  9:29 AM 0.5 mL 01/2019 Intramuscular   Manufacturer: Moderna   Lot: [id number]   [id number]

## 2019-08-24 ENCOUNTER — Other Ambulatory Visit: Payer: Self-pay | Admitting: Family Medicine

## 2019-08-24 NOTE — Telephone Encounter (Signed)
Please contact patient to have him set up appt; then may route back to nurses. Thank you 

## 2019-08-27 NOTE — Telephone Encounter (Signed)
Scheduled 8/23

## 2019-09-24 ENCOUNTER — Other Ambulatory Visit: Payer: Self-pay

## 2019-09-24 ENCOUNTER — Ambulatory Visit (INDEPENDENT_AMBULATORY_CARE_PROVIDER_SITE_OTHER): Payer: Medicare HMO | Admitting: Family Medicine

## 2019-09-24 ENCOUNTER — Encounter: Payer: Self-pay | Admitting: Family Medicine

## 2019-09-24 VITALS — BP 134/88 | HR 82 | Temp 97.6°F | Ht 71.0 in | Wt 193.0 lb

## 2019-09-24 DIAGNOSIS — E785 Hyperlipidemia, unspecified: Secondary | ICD-10-CM

## 2019-09-24 DIAGNOSIS — Z1159 Encounter for screening for other viral diseases: Secondary | ICD-10-CM

## 2019-09-24 DIAGNOSIS — I10 Essential (primary) hypertension: Secondary | ICD-10-CM | POA: Diagnosis not present

## 2019-09-24 DIAGNOSIS — Z79899 Other long term (current) drug therapy: Secondary | ICD-10-CM

## 2019-09-24 DIAGNOSIS — Z125 Encounter for screening for malignant neoplasm of prostate: Secondary | ICD-10-CM | POA: Diagnosis not present

## 2019-09-24 MED ORDER — AMLODIPINE BESYLATE 10 MG PO TABS: ORAL_TABLET | ORAL | 1 refills | Status: DC

## 2019-09-24 MED ORDER — ATORVASTATIN CALCIUM 10 MG PO TABS: ORAL_TABLET | ORAL | 1 refills | Status: DC

## 2019-09-24 NOTE — Progress Notes (Signed)
   Subjective:    Patient ID: Christopher [name], male    DOB: [birth year], 59 y.o.   MRN: [id number]  Hypertension This is a chronic problem. Pertinent negatives include no chest pain, headaches or shortness of breath. Treatments tried: amlodipine 10mg . There are no compliance problems (takes meds every day, eats healthy, walks for exercise).    Cough for 2 weeks.  Relates is more so when he goes outside.  Denies any type of chest tightness pressure pain Denies fever chills sweats Has had his Covid vaccine No shortness of breath  Review of Systems  Constitutional: Negative for activity change.  HENT: Negative for congestion and rhinorrhea.   Respiratory: Negative for cough and shortness of breath.   Cardiovascular: Negative for chest pain.  Gastrointestinal: Negative for abdominal pain, diarrhea, nausea and vomiting.  Genitourinary: Negative for dysuria and hematuria.  Neurological: Negative for weakness and headaches.  Psychiatric/Behavioral: Negative for behavioral problems and confusion.       Objective:   Physical Exam Vitals reviewed.  Constitutional:      General: [name] not in acute distress. HENT:     Head: Normocephalic and atraumatic.  Eyes:     General:        Right eye: No discharge.        Left eye: No discharge.  Neck:     Trachea: No tracheal deviation.  Cardiovascular:     Rate and Rhythm: Normal rate and regular rhythm.     Heart sounds: Normal heart sounds. No murmur heard.   Pulmonary:     Effort: Pulmonary effort is normal. No respiratory distress.     Breath sounds: Normal breath sounds.  Lymphadenopathy:     Cervical: No cervical adenopathy.  Skin:    General: Skin is warm and dry.  Neurological:     Mental Status: [name] alert.     Coordination: Coordination normal.  Psychiatric:        Behavior: Behavior normal.           Assessment & Plan:  Cough-environmental.  No need to do any type of major intervention currently If it does not  resolve in the next 2 to 3 weeks notify us we can do chest x-ray further work-up  HTN- patient seen for follow-up regarding HTN.  Diet, medication compliance, appropriate labs and refills were completed.  Importance of keeping blood pressure under good control to lessen the risk of complications discussed  Hyperlipidemia-importance of diet, weight control, activity, compliance with medications discussed.  Recent labs reviewed.  Any additional labs or refills ordered.  Importance of keeping under good control discussed.  Patient will continue his medicines follow a healthy diet check his lab work.  Patient will do a wellness within the next 6 months

## 2019-09-24 NOTE — Patient Instructions (Signed)

## 2019-11-06 ENCOUNTER — Other Ambulatory Visit: Payer: Self-pay

## 2019-11-06 ENCOUNTER — Ambulatory Visit (INDEPENDENT_AMBULATORY_CARE_PROVIDER_SITE_OTHER): Payer: Medicare HMO | Admitting: Family Medicine

## 2019-11-06 ENCOUNTER — Encounter: Payer: Self-pay | Admitting: Family Medicine

## 2019-11-06 ENCOUNTER — Ambulatory Visit: Payer: Medicare HMO | Admitting: Orthopedic Surgery

## 2019-11-06 VITALS — BP 130/80 | Temp 97.7°F | Wt 190.2 lb

## 2019-11-06 DIAGNOSIS — Z1159 Encounter for screening for other viral diseases: Secondary | ICD-10-CM | POA: Diagnosis not present

## 2019-11-06 DIAGNOSIS — I10 Essential (primary) hypertension: Secondary | ICD-10-CM | POA: Diagnosis not present

## 2019-11-06 DIAGNOSIS — Z125 Encounter for screening for malignant neoplasm of prostate: Secondary | ICD-10-CM | POA: Diagnosis not present

## 2019-11-06 DIAGNOSIS — E785 Hyperlipidemia, unspecified: Secondary | ICD-10-CM | POA: Diagnosis not present

## 2019-11-06 DIAGNOSIS — M5431 Sciatica, right side: Secondary | ICD-10-CM

## 2019-11-06 DIAGNOSIS — Z79899 Other long term (current) drug therapy: Secondary | ICD-10-CM | POA: Diagnosis not present

## 2019-11-06 MED ORDER — OXYCODONE-ACETAMINOPHEN 5-325 MG PO TABS: 1.0000 | ORAL_TABLET | Freq: Four times a day (QID) | ORAL | 0 refills | Status: DC | PRN

## 2019-11-06 NOTE — Progress Notes (Signed)
   Subjective:    Patient ID: Christopher [name], male    DOB: 1960/09/24, 59 y.o.   MRN: [id number]  HPI Patient with low back pain on the right side no apparent injury no known trigger started up out of the blue several days ago radiates down his right leg along the lateral aspect toward the ankle denies any weakness in the leg no loss of bowel or bladder control does have limited range of motion in the right knee because of previous surgery with scar tissue PMH benign patient is disabled and retired Patient arrives with right leg pain from the hip to the toes for 1-1.5 weeks.  Review of Systems  Constitutional: Negative for activity change, fatigue and fever.  HENT: Negative for congestion and rhinorrhea.   Respiratory: Negative for cough and shortness of breath.   Cardiovascular: Negative for chest pain and leg swelling.  Gastrointestinal: Negative for abdominal pain, diarrhea and nausea.  Genitourinary: Negative for dysuria and hematuria.  Neurological: Negative for weakness and headaches.  Psychiatric/Behavioral: Negative for agitation and behavioral problems.       Objective:   Physical Exam Vitals reviewed.  Cardiovascular:     Rate and Rhythm: Normal rate and regular rhythm.     Heart sounds: Normal heart sounds. No murmur heard.   Pulmonary:     Effort: Pulmonary effort is normal.     Breath sounds: Normal breath sounds.  Lymphadenopathy:     Cervical: No cervical adenopathy.  Neurological:     Mental Status: [name] alert.  Psychiatric:        Behavior: Behavior normal.           Assessment & Plan:  Low back nontender negative straight leg raise tight low back along with subjective discomfort down the leg  Consistent with sciatica Recommend anti-inflammatory may use pain medicine for severe pain but not for long-term use Follow-up in 4 to 6 weeks If not dramatically better within 8 weeks physical therapy would be indicated if not well within 12 weeks consider MRI  follow-up sooner problems patient is able to walk on his heels and his toes without difficulty

## 2019-11-07 LAB — LIPID PANEL
Chol/HDL Ratio: 3.7 ratio (ref 0.0–5.0)
Cholesterol, Total: 194 mg/dL (ref 100–199)
HDL: 53 mg/dL (ref >39)
LDL Chol Calc (NIH): 121 mg/dL — ABNORMAL HIGH (ref 0–99)
Triglycerides: 113 mg/dL (ref 0–149)
VLDL Cholesterol Cal: 20 mg/dL (ref 5–40)

## 2019-11-07 LAB — BASIC METABOLIC PANEL
BUN/Creatinine Ratio: 9 (ref 9–20)
BUN: 11 mg/dL (ref 6–24)
CO2: 26 mmol/L (ref 20–29)
Calcium: 9.5 mg/dL (ref 8.7–10.2)
Chloride: 103 mmol/L (ref 96–106)
Creatinine, Ser: 1.22 mg/dL (ref 0.76–1.27)
GFR calc Af Amer: 75 mL/min/{1.73_m2} (ref >59)
GFR calc non Af Amer: 65 mL/min/{1.73_m2} (ref >59)
Glucose: 119 mg/dL — ABNORMAL HIGH (ref 65–99)
Potassium: 3.8 mmol/L (ref 3.5–5.2)
Sodium: 144 mmol/L (ref 134–144)

## 2019-11-07 LAB — HEPATIC FUNCTION PANEL
ALT: 18 IU/L (ref 0–44)
AST: 14 IU/L (ref 0–40)
Albumin: 4.4 g/dL (ref 3.8–4.9)
Alkaline Phosphatase: 124 IU/L — ABNORMAL HIGH (ref 44–121)
Bilirubin Total: 0.6 mg/dL (ref 0.0–1.2)
Bilirubin, Direct: 0.15 mg/dL (ref 0.00–0.40)
Total Protein: 7.1 g/dL (ref 6.0–8.5)

## 2019-11-07 LAB — PSA: Prostate Specific Ag, Serum: 1.3 ng/mL (ref 0.0–4.0)

## 2019-11-07 LAB — HEPATITIS C ANTIBODY: Hep C Virus Ab: 0.1 s/co ratio (ref 0.0–0.9)

## 2019-11-08 ENCOUNTER — Encounter: Payer: Self-pay | Admitting: Family Medicine

## 2019-11-28 ENCOUNTER — Ambulatory Visit: Payer: Medicare HMO | Admitting: Orthopedic Surgery

## 2019-12-03 ENCOUNTER — Ambulatory Visit: Payer: Medicare HMO | Admitting: Family Medicine

## 2019-12-13 ENCOUNTER — Ambulatory Visit (HOSPITAL_COMMUNITY)
Admission: RE | Admit: 2019-12-13 | Discharge: 2019-12-13 | Disposition: A | Discharge status: Home / Self Care | Payer: Medicare HMO | Source: Ambulatory Visit | Attending: Family Medicine | Admitting: Family Medicine

## 2019-12-13 ENCOUNTER — Other Ambulatory Visit: Payer: Self-pay

## 2019-12-13 ENCOUNTER — Encounter: Payer: Self-pay | Admitting: Family Medicine

## 2019-12-13 ENCOUNTER — Ambulatory Visit (INDEPENDENT_AMBULATORY_CARE_PROVIDER_SITE_OTHER): Payer: Medicare HMO | Admitting: Family Medicine

## 2019-12-13 ENCOUNTER — Ambulatory Visit: Payer: Medicare HMO | Admitting: Orthopedic Surgery

## 2019-12-13 VITALS — BP 136/82 | HR 94 | Temp 98.2°F | Wt 191.2 lb

## 2019-12-13 DIAGNOSIS — M25562 Pain in left knee: Secondary | ICD-10-CM

## 2019-12-13 DIAGNOSIS — M25462 Effusion, left knee: Secondary | ICD-10-CM

## 2019-12-13 MED ORDER — DICLOFENAC SODIUM 75 MG PO TBEC: 75.0000 mg | DELAYED_RELEASE_TABLET | Freq: Two times a day (BID) | ORAL | 0 refills | Status: DC

## 2019-12-13 NOTE — Progress Notes (Addendum)
Patient ID: GEO SLONE, male    DOB: 1960/06/02, 59 y.o.   MRN: [id number]   Chief Complaint  Patient presents with  . Knee Pain    Patient reports pain and some swelling when putting presssure on his left knee x 2 days. hx replacement of right knee.    Subjective:  CC: left knee pain  Presents today with a complaint of pain and swelling in his left knee.  2 days ago he felt like his left knee was going to give out, and he fell against the railing.  He does not think that his knee hit anything at that time however it started to swell immediately and the pain has increased.  He has a history of a right knee replacement 3 years ago.  He does use a cane for safety.  Pertinent negatives include no fever, no chills, no shortness of breath, no chest pain, no abdominal pain no urinary symptoms.  He reports that this knee has been sore for a while.    Medical History Yang has a past medical history of Arthritis, Atrial fibrillation ([place]), GERD (gastroesophageal reflux disease), HTN (hypertension), and Hyperlipemia.   Outpatient Encounter Medications as of 12/13/2019  Medication Sig  . amLODipine (NORVASC) 10 MG tablet TAKE 1 TABLET(10 MG) BY MOUTH DAILY  . atorvastatin (LIPITOR) 10 MG tablet TAKE 1 TABLET(10 MG) BY MOUTH DAILY  . diclofenac (VOLTAREN) 75 MG EC tablet Take 1 tablet (75 mg total) by mouth 2 (two) times daily.  . famotidine (PEPCID) 40 MG tablet Take 1 tablet (40 mg total) by mouth daily.  [name] oxyCODONE-acetaminophen (PERCOCET/ROXICET) 5-325 MG tablet Take 1 tablet by mouth every 6 (six) hours as needed.   No facility-administered encounter medications on file as of 12/13/2019.     Review of Systems  Constitutional: Negative for chills and fever.  Respiratory: Negative for shortness of breath.   Cardiovascular: Negative for chest pain.  Gastrointestinal: Negative for abdominal pain.  Musculoskeletal:       Knee soreness, swelling, pain.     Vitals BP 136/82    Pulse 94   Temp 98.2 F (36.8 C)   Wt 191 lb 3.2 oz (86.7 kg)   SpO2 99%   BMI 26.67 kg/m   Objective:   Physical Exam Vitals and nursing note reviewed.  Constitutional:      General: [name] not in acute distress.    Appearance: Normal appearance.  Cardiovascular:     Rate and Rhythm: Normal rate and regular rhythm.     Heart sounds: Normal heart sounds.  Pulmonary:     Effort: Pulmonary effort is normal.     Breath sounds: Normal breath sounds.  Musculoskeletal:        General: Swelling, tenderness and signs of injury present. No deformity.     Comments: Tenderness noted on the medial and lateral sides of the knee.  Tender to touch, and swelling.  Unable to compare to the right knee, as the right knee is an artificial knee.  Denies injury-unsure if this is accurate.  Neurological:     Mental Status: [name] alert.      Assessment and Plan   1. Acute pain of left knee - diclofenac (VOLTAREN) 75 MG EC tablet; Take 1 tablet (75 mg total) by mouth 2 (two) times daily.  Dispense: 30 tablet; Refill: 0 - DG Knee Complete 4 Views Left   Will get x-ray to rule out fracture/osteoarthritis. Start anti -inflammatory with food. Use  ice and compression.  Update: knee x-ray shows: Sizable joint effusion. No fracture or dislocation. Slight narrowing of the joint space medially.  Will refer to ortho for further treatment.   Agrees with plan of care discussed today. Understands warning signs to seek further care: Fever, chills, chest pain, shortness of breath, any significant change in healthcare status. Understands to follow-up with ortho, with [place] if needed.    Pecolia Ades, FNP-C

## 2019-12-13 NOTE — Patient Instructions (Signed)
Start anti-inflammatory with food. Use ice for 20 minutes every 1-2 hours Use a compression sleeve. Get x-ray today.    RICE Therapy for Routine Care of Injuries Many injuries can be cared for with rest, ice, compression, and elevation (RICE therapy). This includes:  Resting the injured part.  Putting ice on the injury.  Putting pressure (compression) on the injury.  Raising the injured part (elevation). Using RICE therapy can help to lessen pain and swelling. Supplies needed:  Ice.  Plastic bag.  Towel.  Elastic bandage.  Pillow or pillows to raise (elevate) your injured body part. How to care for your injury with RICE therapy Rest Limit your normal activities, and try not to use the injured part of your body. You can go back to your normal activities when your doctor says it is okay to do them and you feel okay. Ask your doctor if you should do exercises to help your injury get better. Ice Put ice on the injured area. Do not put ice on your bare skin.  Put ice in a plastic bag.  Place a towel between your skin and the bag.  Leave the ice on for 20 minutes, 2-3 times a day. Use ice on as many days as told by your doctor.  Compression Compression means putting pressure on the injured area. This can be done with an elastic bandage. If an elastic bandage has been put on your injury:  Do not wrap the bandage too tight. Wrap the bandage more loosely if part of your body away from the bandage is blue, swollen, cold, painful, or loses feeling (gets numb).  Take off the bandage and put it on again. Do this every 3-4 hours or as told by your doctor.  See your doctor if the bandage seems to make your problems worse.  Elevation Elevation means keeping the injured area raised. If you can, raise the injured area above your heart or the center of your chest. Contact a doctor if:  You keep having pain and swelling.  Your symptoms get worse. Get help right away if:  You  have sudden bad pain at your injury or lower than your injury.  You have redness or more swelling around your injury.  You have tingling or numbness at your injury or lower than your injury, and it does not go away when you take off the bandage. Summary  Many injuries can be cared for using rest, ice, compression, and elevation (RICE therapy).  You can go back to your normal activities when you feel okay and your doctor says it is okay.  Put ice on the injured area as told by your doctor.  Get help if your symptoms get worse or if you keep having pain and swelling. This information is not intended to replace advice given to you by your health care provider. Make sure you discuss any questions you have with your health care provider. Document Revised: 10/08/2016 Document Reviewed: 10/08/2016 Elsevier Patient Education  [place].

## 2019-12-13 NOTE — Addendum Note (Signed)
Addended by: Patsy Lager on: 12/13/2019 01:19 PM   Modules accepted: Orders

## 2019-12-14 ENCOUNTER — Other Ambulatory Visit: Payer: Self-pay | Admitting: Family Medicine

## 2019-12-14 DIAGNOSIS — M25562 Pain in left knee: Secondary | ICD-10-CM

## 2019-12-31 ENCOUNTER — Encounter: Payer: Medicare HMO | Admitting: Family Medicine

## 2020-01-07 ENCOUNTER — Ambulatory Visit: Payer: Medicare HMO | Admitting: Orthopedic Surgery

## 2020-01-09 IMAGING — CR DG KNEE 1-2V PORT*R*
1 series · 2 of 2 positions shown · non-contrast
Comparison: 10/20/2016

CLINICAL DATA: Right knee replacement

EXAM:
PORTABLE RIGHT KNEE - 1-2 VIEW

[Series 1: ap · 0.17mm/px · 2 of 2 slices shown]
[im 1/2]
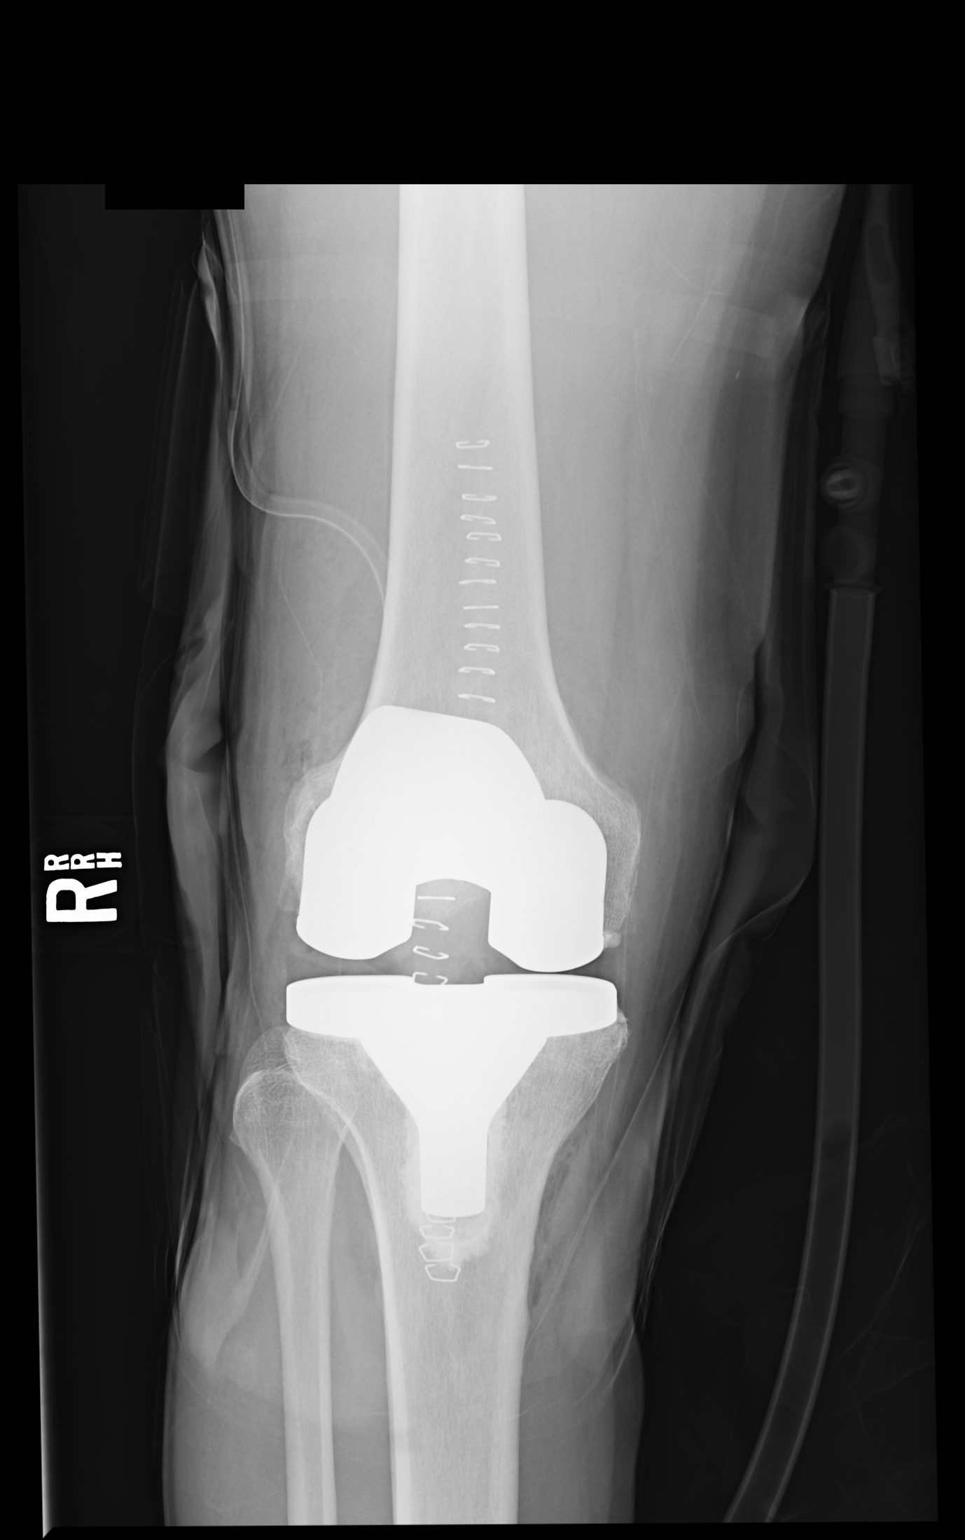
[im 2/2]
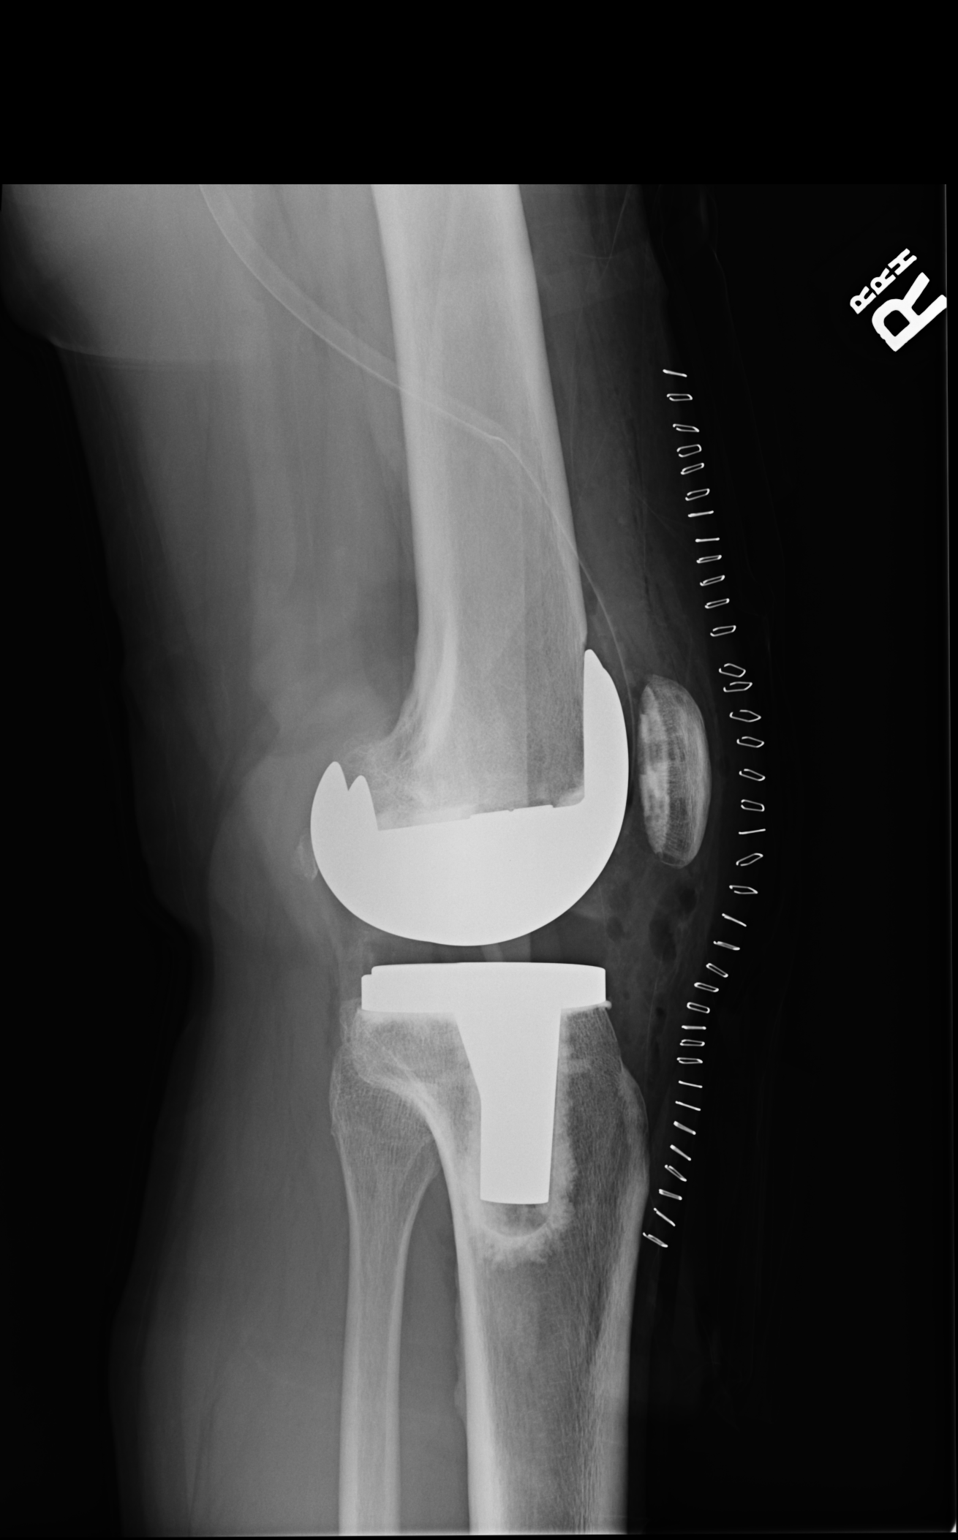

[2 of 2 positions shown; findings below may reference images not displayed]

FINDINGS: Postop right knee replacement. Prosthesis in satisfactory position
alignment. Drain in the suprapatellar bursa. No fracture or
immediate complication
IMPRESSION: Satisfactory right knee replacement

## 2020-03-25 ENCOUNTER — Other Ambulatory Visit: Payer: Self-pay

## 2020-03-25 ENCOUNTER — Encounter: Payer: Self-pay | Admitting: Family Medicine

## 2020-03-25 ENCOUNTER — Ambulatory Visit (INDEPENDENT_AMBULATORY_CARE_PROVIDER_SITE_OTHER): Payer: Medicare HMO | Admitting: Family Medicine

## 2020-03-25 VITALS — BP 138/88 | HR 53 | Temp 97.9°F | Wt 196.2 lb

## 2020-03-25 DIAGNOSIS — I10 Essential (primary) hypertension: Secondary | ICD-10-CM | POA: Diagnosis not present

## 2020-03-25 DIAGNOSIS — E785 Hyperlipidemia, unspecified: Secondary | ICD-10-CM

## 2020-03-25 DIAGNOSIS — R7301 Impaired fasting glucose: Secondary | ICD-10-CM

## 2020-03-25 MED ORDER — ATORVASTATIN CALCIUM 10 MG PO TABS: ORAL_TABLET | ORAL | 1 refills | Status: DC

## 2020-03-25 MED ORDER — AMLODIPINE BESYLATE 10 MG PO TABS: ORAL_TABLET | ORAL | 1 refills | Status: DC

## 2020-03-25 NOTE — Patient Instructions (Signed)

## 2020-03-25 NOTE — Progress Notes (Signed)
   Subjective:    Patient ID: Christopher [name], male    DOB: [birth year], 60 y.o.   MRN: [id number]  Hypertension This is a chronic problem. Pertinent negatives include no chest pain, headaches or shortness of breath. Risk factors for coronary artery disease include male gender. There are no compliance problems.   Taking Amlodipine 10 mg daily and checking blood pressure at least twice a week.  We did discuss a wellness exam.  He defers till later this spring [name] trying to eat healthy and take his medications.   Review of Systems  Constitutional: Negative for activity change.  HENT: Negative for congestion and rhinorrhea.   Respiratory: Negative for cough and shortness of breath.   Cardiovascular: Negative for chest pain.  Gastrointestinal: Negative for abdominal pain, diarrhea, nausea and vomiting.  Genitourinary: Negative for dysuria and hematuria.  Neurological: Negative for weakness and headaches.  Psychiatric/Behavioral: Negative for behavioral problems and confusion.       Objective:   Physical Exam Vitals reviewed.  Constitutional:      General: [name] not in acute distress.    Appearance: [name] well-nourished.  HENT:     Head: Normocephalic and atraumatic.  Eyes:     General:        Right eye: No discharge.        Left eye: No discharge.  Neck:     Trachea: No tracheal deviation.  Cardiovascular:     Rate and Rhythm: Normal rate and regular rhythm.     Heart sounds: Normal heart sounds. No murmur heard.   Pulmonary:     Effort: Pulmonary effort is normal. No respiratory distress.     Breath sounds: Normal breath sounds.  Musculoskeletal:        General: No edema.  Lymphadenopathy:     Cervical: No cervical adenopathy.  Skin:    General: Skin is warm and dry.  Neurological:     Mental Status: [name] alert.     Coordination: Coordination normal.  Psychiatric:        Mood and Affect: Mood and affect normal.        Behavior: Behavior normal.     Blood  pressure checked several times with large cuff Best reading recorded  [place] Visit from 03/25/2020 in [place]  PHQ-2 Total Score 0         Assessment & Plan:  1. HTN (hypertension), benign Blood pressure good control continue current measures watch diet closely avoid excessive salt fit in walking several times a week - Hemoglobin A1c - Lipid Profile - Basic Metabolic Panel (BMET)  2. Hyperlipidemia, unspecified hyperlipidemia type Continue cholesterol medicine recheck lipid profile before next visit - Hemoglobin A1c - Lipid Profile - Basic Metabolic Panel (BMET)  3. Fasting hyperglycemia This was noted on previous lab work he is to check fasting metabolic 7 along with an Z2C to see if he is developing diabetes or prediabetes healthy diet recommended - Hemoglobin A1c - Lipid Profile - Basic Metabolic Panel (BMET)  Wellness exam later this spring

## 2020-04-04 ENCOUNTER — Telehealth: Payer: Self-pay | Admitting: Orthopedic Surgery

## 2020-04-04 NOTE — Telephone Encounter (Signed)
Patient called to request to cancel appointment for yearly total knee replacement; also history of manipulation surgery on same knee; patient had rescheduled 3 times previously. States he is doing okay. Offered reschedule; patient said he will call back to reschedule.

## 2020-04-07 ENCOUNTER — Ambulatory Visit: Payer: Medicare HMO | Admitting: Orthopedic Surgery

## 2020-04-22 DIAGNOSIS — H5200 Hypermetropia, unspecified eye: Secondary | ICD-10-CM | POA: Diagnosis not present

## 2020-05-09 DIAGNOSIS — Z0100 Encounter for examination of eyes and vision without abnormal findings: Secondary | ICD-10-CM | POA: Diagnosis not present

## 2020-05-12 ENCOUNTER — Telehealth: Payer: Self-pay | Admitting: Family Medicine

## 2020-05-12 NOTE — Telephone Encounter (Signed)
Left message for patient to schedule Annual Wellness Visit.  Please schedule with Nurse Health Advisor Shannon Crews, RN at [facility]  

## 2020-05-15 ENCOUNTER — Ambulatory Visit: Payer: Medicare HMO | Admitting: Student

## 2020-07-02 ENCOUNTER — Ambulatory Visit: Payer: Medicare HMO | Admitting: Student

## 2020-07-02 NOTE — Progress Notes (Deleted)
Cardiology Office Note    Date:  07/02/2020   ID:  Christopher [name], DOB 1960/11/10, MRN [id number]  PCP:  Christopher Drown, MD  Cardiologist: Christopher Dolly, MD    No chief complaint on file.   History of Present Illness:    Christopher [name] is a 60 y.o. male with past medical history of chest pain (Coronary CT in 06/2016 showing a calcium score of 0), post-operative atrial fibrillation (occurring following knee replacement in 02/2017), HTN, and HLD who presents to the office today for annual follow-up.   He most recently had a telehealth visit with Dr. Harl [name] in 03/2019 and denied any recent chest pain or palpitations at that time.  He had self discontinued Lopressor in the interim and this was not restarted at the time of his visit.  Had not been on anticoagulation given his CHA2DS2-VASc score of 1 and due to his atrial fibrillation occurring in the postoperative setting.       Past Medical History:  Diagnosis Date  . Arthritis   . Atrial fibrillation ([place])    a. occuring in the post-operative setting in 02/2017  . GERD (gastroesophageal reflux disease)   . HTN (hypertension)   . Hyperlipemia     Past Surgical History:  Procedure Laterality Date  . COLONOSCOPY N/A 07/13/2018   Procedure: COLONOSCOPY;  Surgeon: Christopher Houston, MD;  Location: [facility];  Service: Endoscopy;  Laterality: N/A;  830  . EXAM UNDER ANESTHESIA WITH MANIPULATION OF KNEE Right 04/05/2017   Procedure: EXAM UNDER ANESTHESIA WITH MANIPULATION OF KNEE;  Surgeon: Christopher Civil, MD;  Location: [facility] ORS;  Service: Orthopedics;  Laterality: Right;  . HERNIA REPAIR Right   . KNEE ARTHROSCOPY  right knee  . POLYPECTOMY  07/13/2018   Procedure: POLYPECTOMY;  Surgeon: Christopher Houston, MD;  Location: [facility];  Service: Endoscopy;;  . TOTAL KNEE ARTHROPLASTY Right 02/15/2017   Procedure: TOTAL KNEE ARTHROPLASTY;  Surgeon: Christopher Civil, MD;  Location: [facility];  Service: Orthopedics;  Laterality:  Right;    Current Medications: Outpatient Medications Prior to Visit  Medication Sig Dispense Refill  . amLODipine (NORVASC) 10 MG tablet TAKE 1 TABLET(10 MG) BY MOUTH DAILY 90 tablet 1  . atorvastatin (LIPITOR) 10 MG tablet TAKE 1 TABLET(10 MG) BY MOUTH DAILY 90 tablet 1  . famotidine (PEPCID) 40 MG tablet Take 1 tablet (40 mg total) by mouth daily. 90 tablet 1   No facility-administered medications prior to visit.     Allergies:   Hydrocodone   Social History   Socioeconomic History  . Marital status: Married    Spouse name: Not on file  . Number of children: Not on file  . Years of education: 38  . Highest education level: Not on file  Occupational History  . Occupation: truck Education administrator: [employer]  Tobacco Use  . Smoking status: Never Smoker  . Smokeless tobacco: Never Used  Vaping Use  . Vaping Use: Never used  Substance and Sexual Activity  . Alcohol use: Yes    Comment: occasional beer  . Drug use: No  . Sexual activity: Yes    Birth control/protection: None  Other Topics Concern  . Not on file  Social History Narrative  . Not on file   Social Determinants of Health   Financial Resource Strain: Not on file  Food Insecurity: Not on file  Transportation Needs: Not on file  Physical Activity: Not on file  Stress: Not on file  Social Connections: Not on file     Family History:  The patient's ***family history includes Arthritis in an other family member; CAD (age of onset: 33) in his brother; CAD (age of onset: 6) in his father; Cancer in an other family member; Diabetes in his brother and another family member; Other (age of onset: 40) in his mother.   Review of Systems:    Please see the history of present illness.     All other systems reviewed and are otherwise negative except as noted above.   Physical Exam:    VS:  There were no vitals taken for this visit.   General: Well developed, well nourished,male appearing in no acute  distress. Head: Normocephalic, atraumatic. Neck: No carotid bruits. JVD not elevated.  Lungs: Respirations regular and unlabored, without wheezes or rales.  Heart: ***Regular rate and rhythm. No S3 or S4.  No murmur, no rubs, or gallops appreciated. Abdomen: Appears non-distended. No obvious abdominal masses. Msk:  Strength and tone appear normal for age. No obvious joint deformities or effusions. Extremities: No clubbing or cyanosis. No edema.  Distal pedal pulses are 2+ bilaterally. Neuro: Alert and oriented X 3. Moves all extremities spontaneously. No focal deficits noted. Psych:  Responds to questions appropriately with a normal affect. Skin: No rashes or lesions noted  Wt Readings from Last 3 Encounters:  03/25/20 196 lb 3.2 oz (89 kg)  12/13/19 191 lb 3.2 oz (86.7 kg)  11/06/19 190 lb 3.2 oz (86.3 kg)        Studies/Labs Reviewed:   EKG:  EKG is*** ordered today.  The ekg ordered today demonstrates ***  Recent Labs: 11/06/2019: ALT 18; BUN 11; Creatinine, Ser 1.22; Potassium 3.8; Sodium 144   Lipid Panel    Component Value Date/Time   CHOL 194 11/06/2019 0846   TRIG 113 11/06/2019 0846   HDL 53 11/06/2019 0846   CHOLHDL 3.7 11/06/2019 0846   CHOLHDL 3.0 06/07/2016 0440   VLDL 20 06/07/2016 0440   LDLCALC 121 (H) 11/06/2019 0846    Additional studies/ records that were reviewed today include:   Echocardiogram: 02/2017 Study Conclusions   - Left ventricle: The cavity size was normal. Wall thickness was  increased in a pattern of mild LVH. Systolic function was normal.  The estimated ejection fraction was in the range of 60% to 65%.  Wall motion was normal; there were no regional wall motion  abnormalities. Left ventricular diastolic function parameters  were normal.  - Pulmonic valve: There was mild regurgitation.   Coronary CT: 06/2016 FINDINGS: Non-cardiac: See separate report from [facility]. No significant findings on limited lung and  soft tissue windows.  Calcium Score:  0  Coronary Arteries: Right dominant with no anomalies  LM:  Normal  LAD:  Normal  D1: Normal  D2: Normal  Circumflex: Normal  OM1:  Normal  AV groove normal  RCA:  Normal  PDA: Normal  PLA:  Normal  IMPRESSION: Normal right dominant coronary arteries  Calcium Score of 0  Normal aortic root 2.8 cm  No proximal pulmonary emboli  Assessment:    No diagnosis found.   Plan:   In order of problems listed above:  1. ***    Shared Decision Making/Informed Consent:   {Are you ordering a CV Procedure (e.g. stress test, cath, DCCV, TEE, etc)?   Press F2        :[id number]}    Medication Adjustments/Labs and Tests Ordered: Current medicines  are reviewed at length with the patient today.  Concerns regarding medicines are outlined above.  Medication changes, Labs and Tests ordered today are listed in the Patient Instructions below. There are no Patient Instructions on file for this visit.   Signed, Erma Heritage, PA-C  07/02/2020 7:55 AM    [facility] [address] [place], [place] [address] Phone: [phone] Fax: [phone]

## 2020-09-15 ENCOUNTER — Ambulatory Visit (INDEPENDENT_AMBULATORY_CARE_PROVIDER_SITE_OTHER): Payer: Medicare HMO | Admitting: Family Medicine

## 2020-09-15 ENCOUNTER — Other Ambulatory Visit: Payer: Self-pay | Admitting: *Deleted

## 2020-09-15 ENCOUNTER — Other Ambulatory Visit: Payer: Self-pay

## 2020-09-15 VITALS — BP 114/62 | Wt 193.6 lb

## 2020-09-15 DIAGNOSIS — I10 Essential (primary) hypertension: Secondary | ICD-10-CM

## 2020-09-15 DIAGNOSIS — R519 Headache, unspecified: Secondary | ICD-10-CM

## 2020-09-15 DIAGNOSIS — R7301 Impaired fasting glucose: Secondary | ICD-10-CM

## 2020-09-15 DIAGNOSIS — Z79899 Other long term (current) drug therapy: Secondary | ICD-10-CM

## 2020-09-15 DIAGNOSIS — D170 Benign lipomatous neoplasm of skin and subcutaneous tissue of head, face and neck: Secondary | ICD-10-CM

## 2020-09-15 DIAGNOSIS — E785 Hyperlipidemia, unspecified: Secondary | ICD-10-CM

## 2020-09-15 DIAGNOSIS — Z125 Encounter for screening for malignant neoplasm of prostate: Secondary | ICD-10-CM

## 2020-09-15 NOTE — Progress Notes (Signed)
   Subjective:    Patient ID: Christopher [name], male    DOB: July 04, 1960, 60 y.o.   MRN: [id number]  HPI  Patient arrive with a knot on the back of his head for awhile. Patient states he feels like it is getting bigger.  Some headaches in back of head and neck into the side of the head Present off and on for months Hit his head 1 year ago No double vision no blurred vision  Patient denies any vomiting currently Vision doing okay currently At times the head hurts bad enough to where he has to lay down He has not had these headaches before in the past Blood pressure has been okay  Review of Systems     Objective:   Physical Exam General-in no acute distress Eyes-no discharge Lungs-respiratory rate normal, CTA CV-no murmurs,RRR Extremities skin warm dry no edema Neuro grossly normal Behavior normal, alert Lipoma back of head On the posterior right side soft Neurologic balance seems fine      Assessment & Plan:  New onset severe headaches over the past few months Recommend MRI to look at the brain Given his blurred vision along with new onset along with occasional nocturnal headache hard to rule out more serious issue  Lipoma-referral to ENT patient feels this is getting bigger it is causing him to have discomfort as well as makes it difficult for him to lay his head on that side Surgery can evaluate whether or not to remove it

## 2020-09-26 ENCOUNTER — Ambulatory Visit (HOSPITAL_COMMUNITY)
Admission: RE | Admit: 2020-09-26 | Discharge: 2020-09-26 | Disposition: A | Discharge status: Home / Self Care | Payer: Medicare HMO | Source: Ambulatory Visit | Attending: Family Medicine | Admitting: Family Medicine

## 2020-09-26 ENCOUNTER — Other Ambulatory Visit: Payer: Self-pay

## 2020-09-26 DIAGNOSIS — R519 Headache, unspecified: Secondary | ICD-10-CM | POA: Diagnosis not present

## 2020-09-26 DIAGNOSIS — G9389 Other specified disorders of brain: Secondary | ICD-10-CM | POA: Diagnosis not present

## 2020-09-29 NOTE — Progress Notes (Signed)
Patient has been informed per drs result notes and recommendations, he verbalizes understanding. Patient states since his headaches are not frequent , he prefers to hold off on the neurology referral at this time.

## 2020-10-28 DIAGNOSIS — Z125 Encounter for screening for malignant neoplasm of prostate: Secondary | ICD-10-CM | POA: Diagnosis not present

## 2020-10-28 DIAGNOSIS — I10 Essential (primary) hypertension: Secondary | ICD-10-CM | POA: Diagnosis not present

## 2020-10-28 DIAGNOSIS — R7301 Impaired fasting glucose: Secondary | ICD-10-CM | POA: Diagnosis not present

## 2020-10-28 DIAGNOSIS — E785 Hyperlipidemia, unspecified: Secondary | ICD-10-CM | POA: Diagnosis not present

## 2020-10-28 DIAGNOSIS — Z79899 Other long term (current) drug therapy: Secondary | ICD-10-CM | POA: Diagnosis not present

## 2020-10-29 LAB — HEMOGLOBIN A1C
Est. average glucose Bld gHb Est-mCnc: 134 mg/dL
Hgb A1c MFr Bld: 6.3 % — ABNORMAL HIGH (ref 4.8–5.6)

## 2020-10-29 LAB — BASIC METABOLIC PANEL
BUN/Creatinine Ratio: 12 (ref 9–20)
BUN: 14 mg/dL (ref 6–24)
CO2: 24 mmol/L (ref 20–29)
Calcium: 9.3 mg/dL (ref 8.7–10.2)
Chloride: 99 mmol/L (ref 96–106)
Creatinine, Ser: 1.13 mg/dL (ref 0.76–1.27)
Glucose: 128 mg/dL — ABNORMAL HIGH (ref 70–99)
Potassium: 3.3 mmol/L — ABNORMAL LOW (ref 3.5–5.2)
Sodium: 139 mmol/L (ref 134–144)
eGFR: 75 mL/min/{1.73_m2} (ref >59)

## 2020-10-29 LAB — LIPID PANEL
Chol/HDL Ratio: 3.8 ratio (ref 0.0–5.0)
Cholesterol, Total: 194 mg/dL (ref 100–199)
HDL: 51 mg/dL (ref >39)
LDL Chol Calc (NIH): 119 mg/dL — ABNORMAL HIGH (ref 0–99)
Triglycerides: 136 mg/dL (ref 0–149)
VLDL Cholesterol Cal: 24 mg/dL (ref 5–40)

## 2020-10-29 LAB — HEPATIC FUNCTION PANEL
ALT: 17 IU/L (ref 0–44)
AST: 15 IU/L (ref 0–40)
Albumin: 4.7 g/dL (ref 3.8–4.9)
Alkaline Phosphatase: 130 IU/L — ABNORMAL HIGH (ref 44–121)
Bilirubin Total: 0.8 mg/dL (ref 0.0–1.2)
Bilirubin, Direct: 0.18 mg/dL (ref 0.00–0.40)
Total Protein: 7.3 g/dL (ref 6.0–8.5)

## 2020-10-29 LAB — PSA: Prostate Specific Ag, Serum: 1.5 ng/mL (ref 0.0–4.0)

## 2020-11-19 ENCOUNTER — Ambulatory Visit: Payer: Medicare HMO | Admitting: Family Medicine

## 2020-11-19 ENCOUNTER — Encounter: Payer: Self-pay | Admitting: Family Medicine

## 2020-11-24 ENCOUNTER — Other Ambulatory Visit: Payer: Self-pay

## 2020-11-24 MED ORDER — ATORVASTATIN CALCIUM 10 MG PO TABS: ORAL_TABLET | ORAL | 1 refills | Status: DC

## 2020-11-24 MED ORDER — AMLODIPINE BESYLATE 10 MG PO TABS: ORAL_TABLET | ORAL | 1 refills | Status: DC

## 2020-12-09 ENCOUNTER — Other Ambulatory Visit: Payer: Self-pay

## 2020-12-09 ENCOUNTER — Ambulatory Visit (INDEPENDENT_AMBULATORY_CARE_PROVIDER_SITE_OTHER): Payer: Medicare HMO

## 2020-12-09 VITALS — BP 142/86 | HR 67 | Ht 71.0 in | Wt 195.6 lb

## 2020-12-09 DIAGNOSIS — Z0000 Encounter for general adult medical examination without abnormal findings: Secondary | ICD-10-CM

## 2020-12-09 NOTE — Patient Instructions (Signed)
Christopher [name] , Thank you for taking time to come for your Medicare Wellness Visit. I appreciate your ongoing commitment to your health goals. Please review the following plan we discussed and let me know if I can assist you in the future.   Screening recommendations/referrals: Colonoscopy: Done 07/13/2018 Repeat in 5 years  Recommended yearly ophthalmology/optometry visit for glaucoma screening and checkup Recommended yearly dental visit for hygiene and checkup  Vaccinations: Influenza vaccine: Declined  Pneumococcal vaccine: Not due until age 43 Tdap vaccine: Done 02/01/2014 Repeat in 10 years  Shingles vaccine: Shingrix discussed. Please contact your pharmacy for coverage information.     Covid-19: Done 04/27/19, 05/30/19, and 02/04/20.  Advanced directives: Advance directive discussed with you today. Even though you declined this today, please call our office should you change your mind, and we can give you the proper paperwork for you to fill out.   Conditions/risks identified: Aim for 30 minutes of exercise or brisk walking each day, drink 6-8 glasses of water and eat lots of fruits and vegetables.   Next appointment: Follow up in one year for your annual wellness visit 2023.  Preventive Care 40-64 Years, Male Preventive care refers to lifestyle choices and visits with your health care provider that can promote health and wellness. What does preventive care include? A yearly physical exam. This is also called an annual well check. Dental exams once or twice a year. Routine eye exams. Ask your health care provider how often you should have your eyes checked. Personal lifestyle choices, including: Daily care of your teeth and gums. Regular physical activity. Eating a healthy diet. Avoiding tobacco and drug use. Limiting alcohol use. Practicing safe sex. Taking low-dose aspirin every day starting at age 55. What happens during an annual well check? The services and screenings done  by your health care provider during your annual well check will depend on your age, overall health, lifestyle risk factors, and family history of disease. Counseling  Your health care provider may ask you questions about your: Alcohol use. Tobacco use. Drug use. Emotional well-being. Home and relationship well-being. Sexual activity. Eating habits. Work and work Statistician. Screening  You may have the following tests or measurements: Height, weight, and BMI. Blood pressure. Lipid and cholesterol levels. These may be checked every 5 years, or more frequently if you are over 55 years old. Skin check. Lung cancer screening. You may have this screening every year starting at age 25 if you have a 30-pack-year history of smoking and currently smoke or have quit within the past 15 years. Fecal occult blood test (FOBT) of the stool. You may have this test every year starting at age 56. Flexible sigmoidoscopy or colonoscopy. You may have a sigmoidoscopy every 5 years or a colonoscopy every 10 years starting at age 74. Prostate cancer screening. Recommendations will vary depending on your family history and other risks. Hepatitis C blood test. Hepatitis B blood test. Sexually transmitted disease (STD) testing. Diabetes screening. This is done by checking your blood sugar (glucose) after you have not eaten for a while (fasting). You may have this done every 1-3 years. Discuss your test results, treatment options, and if necessary, the need for more tests with your health care provider. Vaccines  Your health care provider may recommend certain vaccines, such as: Influenza vaccine. This is recommended every year. Tetanus, diphtheria, and acellular pertussis (Tdap, Td) vaccine. You may need a Td booster every 10 years. Zoster vaccine. You may need this after age 58. Pneumococcal  13-valent conjugate (PCV13) vaccine. You may need this if you have certain conditions and have not been  vaccinated. Pneumococcal polysaccharide (PPSV23) vaccine. You may need one or two doses if you smoke cigarettes or if you have certain conditions. Talk to your health care provider about which screenings and vaccines you need and how often you need them. This information is not intended to replace advice given to you by your health care provider. Make sure you discuss any questions you have with your health care provider. Document Released: 02/14/2015 Document Revised: 10/08/2015 Document Reviewed: 11/19/2014 Elsevier Interactive Patient Education  2017 [place] Prevention in the Home Falls can cause injuries. They can happen to people of all ages. There are many things you can do to make your home safe and to help prevent falls. What can I do on the outside of my home? Regularly fix the edges of walkways and driveways and fix any cracks. Remove anything that might make you trip as you walk through a door, such as a raised step or threshold. Trim any bushes or trees on the path to your home. Use bright outdoor lighting. Clear any walking paths of anything that might make someone trip, such as rocks or tools. Regularly check to see if handrails are loose or broken. Make sure that both sides of any steps have handrails. Any raised decks and porches should have guardrails on the edges. Have any leaves, snow, or ice cleared regularly. Use sand or salt on walking paths during winter. Clean up any spills in your garage right away. This includes oil or grease spills. What can I do in the bathroom? Use night lights. Install grab bars by the toilet and in the tub and shower. Do not use towel bars as grab bars. Use non-skid mats or decals in the tub or shower. If you need to sit down in the shower, use a plastic, non-slip stool. Keep the floor dry. Clean up any water that spills on the floor as soon as it happens. Remove soap buildup in the tub or shower regularly. Attach bath mats  securely with double-sided non-slip rug tape. Do not have throw rugs and other things on the floor that can make you trip. What can I do in the bedroom? Use night lights. Make sure that you have a light by your bed that is easy to reach. Do not use any sheets or blankets that are too big for your bed. They should not hang down onto the floor. Have a firm chair that has side arms. You can use this for support while you get dressed. Do not have throw rugs and other things on the floor that can make you trip. What can I do in the kitchen? Clean up any spills right away. Avoid walking on wet floors. Keep items that you use a lot in easy-to-reach places. If you need to reach something above you, use a strong step stool that has a grab bar. Keep electrical cords out of the way. Do not use floor polish or wax that makes floors slippery. If you must use wax, use non-skid floor wax. Do not have throw rugs and other things on the floor that can make you trip. What can I do with my stairs? Do not leave any items on the stairs. Make sure that there are handrails on both sides of the stairs and use them. Fix handrails that are broken or loose. Make sure that handrails are as long as the stairways.  Check any carpeting to make sure that it is firmly attached to the stairs. Fix any carpet that is loose or worn. Avoid having throw rugs at the top or bottom of the stairs. If you do have throw rugs, attach them to the floor with carpet tape. Make sure that you have a light switch at the top of the stairs and the bottom of the stairs. If you do not have them, ask someone to add them for you. What else can I do to help prevent falls? Wear shoes that: Do not have high heels. Have rubber bottoms. Are comfortable and fit you well. Are closed at the toe. Do not wear sandals. If you use a stepladder: Make sure that it is fully opened. Do not climb a closed stepladder. Make sure that both sides of the stepladder  are locked into place. Ask someone to hold it for you, if possible. Clearly mark and make sure that you can see: Any grab bars or handrails. First and last steps. Where the edge of each step is. Use tools that help you move around (mobility aids) if they are needed. These include: Canes. Walkers. Scooters. Crutches. Turn on the lights when you go into a dark area. Replace any light bulbs as soon as they burn out. Set up your furniture so you have a clear path. Avoid moving your furniture around. If any of your floors are uneven, fix them. If there are any pets around you, be aware of where they are. Review your medicines with your doctor. Some medicines can make you feel dizzy. This can increase your chance of falling. Ask your doctor what other things that you can do to help prevent falls. This information is not intended to replace advice given to you by your health care provider. Make sure you discuss any questions you have with your health care provider. Document Released: 11/14/2008 Document Revised: 06/26/2015 Document Reviewed: 02/22/2014 Elsevier Interactive Patient Education  2017 [employer].

## 2020-12-09 NOTE — Progress Notes (Signed)
Subjective:   Christopher [name] is a 60 y.o. male who presents for an Initial Medicare Annual Wellness Visit.  Review of Systems     Cardiac Risk Factors include: advanced age (>37men, >18 women);hypertension;male gender;sedentary lifestyle     Objective:    Today's Vitals   12/09/20 0831  BP: (!) 142/86  Pulse: 67  SpO2: 100%  Weight: 195 lb 9.6 oz (88.7 kg)  Height: 5\' 11"  (1.803 m)   Body mass index is 27.28 kg/m.  Advanced Directives 12/09/2020 11/25/2018 07/13/2018 02/05/2018 05/31/2017 05/24/2017 05/17/2017  Does Patient Have a Medical Advance Directive? No No No No No No No  Would patient like information on creating a medical advance directive? No - Patient declined - No - Patient declined No - Patient declined No - Patient declined No - Patient declined No - Patient declined    Current Medications (verified) Outpatient Encounter Medications as of 12/09/2020  Medication Sig   amLODipine (NORVASC) 10 MG tablet TAKE 1 TABLET(10 MG) BY MOUTH DAILY   atorvastatin (LIPITOR) 10 MG tablet TAKE 1 TABLET(10 MG) BY MOUTH DAILY   famotidine (PEPCID) 40 MG tablet Take 1 tablet (40 mg total) by mouth daily.   No facility-administered encounter medications on file as of 12/09/2020.    Allergies (verified) Hydrocodone   History: Past Medical History:  Diagnosis Date   Arthritis    Atrial fibrillation ([place])    a. occuring in the post-operative setting in 02/2017   GERD (gastroesophageal reflux disease)    HTN (hypertension)    Hyperlipemia    Past Surgical History:  Procedure Laterality Date   COLONOSCOPY N/A 07/13/2018   Procedure: COLONOSCOPY;  Surgeon: Rogene Houston, MD;  [facility];  Service: Endoscopy;  Laterality: N/A;  [place] OF KNEE Right 04/05/2017   Procedure: EXAM UNDER ANESTHESIA WITH MANIPULATION OF KNEE;  Surgeon: Carole Civil, MD;  Location: [facility];  Service: Orthopedics;  Laterality: Right;   HERNIA  REPAIR Right    KNEE ARTHROSCOPY  right knee   POLYPECTOMY  07/13/2018   Procedure: POLYPECTOMY;  Surgeon: Rogene Houston, MD;  Location: [facility];  Service: Endoscopy;;   TOTAL KNEE ARTHROPLASTY Right 02/15/2017   Procedure: TOTAL KNEE ARTHROPLASTY;  Surgeon: Carole Civil, MD;  Location: [facility];  Service: Orthopedics;  Laterality: Right;   Family History  Problem Relation Age of Onset   Arthritis Other    Cancer Other    Diabetes Other    Other Mother 19       sepsis   CAD Father 47   CAD Brother 59   Diabetes Brother    Colon cancer Neg Hx    Social History   Socioeconomic History   Marital status: Married    Spouse name: Tammy   Number of children: 2   Years of education: 12   Highest education level: Not on file  Occupational History   Occupation: truck Education administrator: [employer]  Tobacco Use   Smoking status: Never   Smokeless tobacco: Never  Vaping Use   Vaping Use: Never used  Substance and Sexual Activity   Alcohol use: Yes    Comment: occasional beer   Drug use: No   Sexual activity: Yes    Birth control/protection: None  Other Topics Concern   Not on file  Social History Narrative   1 son and 1 daughter   3 grandchildren  Social Determinants of Health   Financial Resource Strain: Low Risk    Difficulty of Paying Living Expenses: Not hard at all  Food Insecurity: No Food Insecurity   Worried About Charity fundraiser in the Last Year: Never true   Real in the Last Year: Never true  Transportation Needs: No Transportation Needs   Lack of Transportation (Medical): No   Lack of Transportation (Non-Medical): No  Physical Activity: Sufficiently Active   Days of Exercise per Week: 5 days   Minutes of Exercise per Session: 30 min  Stress: No Stress Concern Present   Feeling of Stress : Not at all  Social Connections: Socially Integrated   Frequency of Communication with Friends and Family: More than three times a week    Frequency of Social Gatherings with Friends and Family: More than three times a week   Attends Religious Services: More than 4 times per year   Active Member of [employer] or Organizations: Yes   Attends Music therapist: More than 4 times per year   Marital Status: Married    Tobacco Counseling Counseling given: Not Answered   Clinical Intake:  Pre-visit preparation completed: Yes  Pain : No/denies pain     BMI - recorded: 27.26 Nutritional Status: BMI 25 -29 Overweight Nutritional Risks: None Diabetes: No  How often do you need to have someone help you when you read instructions, pamphlets, or other written materials from your doctor or pharmacy?: 1 - Never  Diabetic?NO  Interpreter Needed?: No  Information entered by :: MJ Taneil Lazarus, LPN   Activities of Daily Living In your present state of health, do you have any difficulty performing the following activities: 12/09/2020  Hearing? N  Vision? N  Difficulty concentrating or making decisions? N  Walking or climbing stairs? N  Dressing or bathing? N  Doing errands, shopping? N  Preparing Food and eating ? N  Using the Toilet? N  In the past six months, have you accidently leaked urine? N  Do you have problems with loss of bowel control? N  Managing your Medications? N  Managing your Finances? N  Housekeeping or managing your Housekeeping? N  Some recent data might be hidden    Patient Care Team: Kathyrn Drown, MD as PCP - General (Family Medicine) Harl Bowie Alphonse Guild, MD as PCP - Cardiology (Cardiology)  Indicate any recent Medical Services you may have received from other than [facility] providers in the past year (date may be approximate).     Assessment:   This is a routine wellness examination for Christopher [name].  Hearing/Vision screen Hearing Screening - Comments:: No hearing issues. Vision Screening - Comments:: [facility], glasses, 2022.  Dietary issues and exercise activities discussed: Current Exercise  Habits: Home exercise routine, Type of exercise: walking, Time (Minutes): 30, Frequency (Times/Week): 5, Weekly Exercise (Minutes/Week): 150, Intensity: Mild, Exercise limited by: orthopedic condition(s);cardiac condition(s)   Goals Addressed   None    Depression Screen PHQ 2/9 Scores 12/09/2020 09/15/2020 03/25/2020 12/13/2019 08/24/2017  PHQ - 2 Score 0 0 0 0 2  PHQ- 9 Score - - - - 9    Fall Risk Fall Risk  12/09/2020 12/13/2019  Falls in the past year? 0 0  Number falls in past yr: 0 -  Injury with Fall? 0 -  Risk for fall due to : Orthopedic patient -  Follow up Falls prevention discussed Falls evaluation completed    [place]:  Any stairs in or around the home? Yes  If so, are there any without handrails? No  Home free of loose throw rugs in walkways, pet beds, electrical cords, etc? Yes  Adequate lighting in your home to reduce risk of falls? Yes   ASSISTIVE DEVICES UTILIZED TO PREVENT FALLS:  Life alert? No  Use of a cane, walker or w/c? No  Grab bars in the bathroom? Yes  Shower chair or bench in shower? Yes  Elevated toilet seat or a handicapped toilet? Yes   TIMED UP AND GO:  Was the test performed? Yes .  Length of time to ambulate 10 feet: 10 sec.   Gait steady and fast without use of assistive device  Cognitive Function:     6CIT Screen 12/09/2020  What Year? 0 points  What month? 0 points  What time? 0 points  Count back from 20 0 points  Months in reverse 4 points  Repeat phrase 0 points  Total Score 4    Immunizations Immunization History  Administered Date(s) Administered   Moderna Sars-Covid-2 Vaccination 04/27/2019, 05/30/2019, 02/04/2020   Td 02/01/2014    TDAP status: Up to date  Flu Vaccine status: Declined, Education has been provided regarding the importance of this vaccine but patient still declined. Advised may receive this vaccine at local pharmacy or Health Dept. Aware to provide a copy of the  vaccination record if obtained from local pharmacy or Health Dept. Verbalized acceptance and understanding.  Pneumococcal vaccine status: Due, Education has been provided regarding the importance of this vaccine. Advised may receive this vaccine at local pharmacy or Health Dept. Aware to provide a copy of the vaccination record if obtained from local pharmacy or Health Dept. Verbalized acceptance and understanding.  Covid-19 vaccine status: Completed vaccines  Qualifies for Shingles Vaccine? Yes   Zostavax completed No   Shingrix Completed?: No.    Education has been provided regarding the importance of this vaccine. Patient has been advised to call insurance company to determine out of pocket expense if they have not yet received this vaccine. Advised may also receive vaccine at local pharmacy or Health Dept. Verbalized acceptance and understanding.  Screening Tests Health Maintenance  Topic Date Due   Pneumococcal Vaccine 40-52 Years old (1 - PCV) Never done   Zoster Vaccines- Shingrix (1 of 2) Never done   COVID-19 Vaccine (4 - Booster for Moderna series) 03/31/2020   COLONOSCOPY (Pts 45-6yrs Insurance coverage will need to be confirmed)  07/13/2023   TETANUS/TDAP  02/02/2024   Hepatitis C Screening  Completed   HIV Screening  Completed   HPV VACCINES  Aged Out   INFLUENZA VACCINE  Discontinued    Health Maintenance  Health Maintenance Due  Topic Date Due   Pneumococcal Vaccine 25-77 Years old (1 - PCV) Never done   Zoster Vaccines- Shingrix (1 of 2) Never done   COVID-19 Vaccine (4 - Booster for Moderna series) 03/31/2020    Colorectal cancer screening: Type of screening: Colonoscopy. Completed 07/13/2018. Repeat every 5 years  Lung Cancer Screening: (Low Dose CT Chest recommended if Age 110-80 years, 30 pack-year currently smoking OR have quit w/in 15years.) does not qualify.    Additional Screening:  Hepatitis C Screening: does qualify; Completed 11/06/2019  Vision  Screening: Recommended annual ophthalmology exams for early detection of glaucoma and other disorders of the eye. Is the patient up to date with their annual eye exam?  Yes  Who is the provider or what is the name  of the office in which the patient attends annual eye exams? [facility] If pt is not established with a provider, would they like to be referred to a provider to establish care? No .   Dental Screening: Recommended annual dental exams for proper oral hygiene  Community Resource Referral / Chronic Care Management: CRR required this visit?  No   CCM required this visit?  No      Plan:     I have personally reviewed and noted the following in the patient's chart:   Medical and social history Use of alcohol, tobacco or illicit drugs  Current medications and supplements including opioid prescriptions. Patient is not currently taking opioid prescriptions. Functional ability and status Nutritional status Physical activity Advanced directives List of other physicians Hospitalizations, surgeries, and ER visits in previous 12 months Vitals Screenings to include cognitive, depression, and falls Referrals and appointments  In addition, I have reviewed and discussed with patient certain preventive protocols, quality metrics, and best practice recommendations. A written personalized care plan for preventive services as well as general preventive health recommendations were provided to patient.     Chriss Driver, LPN   [id number]   Nurse Notes: Up to date on colonoscopy and eye exam. Discussed flu, shingles and pneumococcal vaccines.

## 2021-01-28 ENCOUNTER — Telehealth: Payer: Self-pay | Admitting: *Deleted

## 2021-01-28 NOTE — Chronic Care Management (AMB) (Signed)
Chronic Care Management   Note  01/28/2021 Name: GRAYSYN BACHE MRN: [id number] DOB: September 08, 1960  KEGHAN MCFARREN is a 60 y.o. year old male who is a primary care patient of [name], [name], MD. I reached out to [name] by phone today in response to a referral sent by Mr. Kainoah Bartosiewicz [facility] PCP.  Mr. Piechota was given information about Chronic Care Management services today including:  CCM service includes personalized support from designated clinical staff supervised by his physician, including individualized plan of care and coordination with other care providers 24/7 contact phone numbers for assistance for urgent and routine care needs. Service will only be billed when office clinical staff spend 20 minutes or more in a month to coordinate care. Only one practitioner may furnish and bill the service in a calendar month. The patient may stop CCM services at any time (effective at the end of the month) by phone call to the office staff. The patient is responsible for co-pay (up to 20% after annual deductible is met) if co-pay is required by the individual health plan.   Patient agreed to services and verbal consent obtained.   Follow up plan: Telephone appointment with care management team member scheduled for:02/04/21  [place] Management  Direct Dial: [phone]

## 2021-02-04 ENCOUNTER — Telehealth: Payer: Self-pay | Admitting: *Deleted

## 2021-02-04 ENCOUNTER — Telehealth: Payer: Medicare HMO

## 2021-02-04 NOTE — Telephone Encounter (Signed)
°  Care Management   Follow Up Note   02/04/2021 Name: Christopher [name] MRN: [id number] DOB: [birth year]   Referred by: Kathyrn Drown, MD Reason for referral : Chronic Care Management (HTN, HLD)   An unsuccessful telephone outreach was attempted today. The patient was referred to the case management team for assistance with care management and care coordination.   Follow Up Plan: Telephone follow up appointment with care management team member scheduled for:  upon care guide rescheduling.  Jacqlyn Larsen [facility], BSN RN Case Manager [facility] [phone]

## 2021-02-11 ENCOUNTER — Telehealth: Payer: Self-pay | Admitting: *Deleted

## 2021-02-11 NOTE — Chronic Care Management (AMB) (Signed)
°  Care Management   Note  02/11/2021 Name: Christopher [name] MRN: [id number] DOB: [birth year]  [name] is a 61 y.o. year old male who is a primary care patient of [name], [name], MD and is actively engaged with the care management team. I reached out to [name] by phone today to assist with re-scheduling an initial visit with the RN Case Manager  Follow up plan: Unsuccessful telephone outreach attempt made. A HIPAA compliant phone message was left for the patient providing contact information and requesting a return call.  The care management team will reach out to the patient again over the next 7 days.  If patient returns call to provider office, please advise to call [place] at [phone].  [place] [facility]  Direct Dial: [phone]

## 2021-02-18 NOTE — Chronic Care Management (AMB) (Signed)
°  Care Management   Note  02/18/2021 Name: MASAKI ROTHBAUER MRN: [id number] DOB: September 12, 1960  Tyson Alias Coley is a 61 y.o. year old male who is a primary care patient of [name], [name], MD and is actively engaged with the care management team. I reached out to [name] by phone today to assist with re-scheduling an initial visit with the RN Case Manager  Follow up plan: Telephone appointment with care management team member scheduled for:02/25/21  [place] Management  Direct Dial: [phone]

## 2021-02-25 ENCOUNTER — Ambulatory Visit (INDEPENDENT_AMBULATORY_CARE_PROVIDER_SITE_OTHER): Payer: Medicare HMO | Admitting: *Deleted

## 2021-02-25 DIAGNOSIS — E785 Hyperlipidemia, unspecified: Secondary | ICD-10-CM

## 2021-02-25 DIAGNOSIS — I10 Essential (primary) hypertension: Secondary | ICD-10-CM

## 2021-02-25 NOTE — Chronic Care Management (AMB) (Signed)
Chronic Care Management   CCM RN Visit Note  02/25/2021 Name: Christopher [name] MRN: [id number] DOB: 05-22-1960  Subjective: Christopher [name] is a 61 y.o. year old male who is a primary care patient of [name], [name], MD. The care management team was consulted for assistance with disease management and care coordination needs.    Engaged with patient by telephone for follow up visit in response to provider referral for case management and/or care coordination services.   Consent to Services:  The patient was given information about Chronic Care Management services, agreed to services, and gave verbal consent prior to initiation of services.  Please see initial visit note for detailed documentation.   Patient agreed to services and verbal consent obtained.   Assessment: Review of patient past medical history, allergies, medications, health status, including review of consultants reports, laboratory and other test data, was performed as part of comprehensive evaluation and provision of chronic care management services.   SDOH (Social Determinants of Health) assessments and interventions performed:  SDOH Interventions    Flowsheet Row Most Recent Value  SDOH Interventions   Food Insecurity Interventions Intervention Not Indicated  Transportation Interventions Intervention Not Indicated        CCM Care Plan  Allergies  Allergen Reactions   Hydrocodone Itching    Outpatient Encounter Medications as of 02/25/2021  Medication Sig   amLODipine (NORVASC) 10 MG tablet TAKE 1 TABLET(10 MG) BY MOUTH DAILY   atorvastatin (LIPITOR) 10 MG tablet TAKE 1 TABLET(10 MG) BY MOUTH DAILY   famotidine (PEPCID) 40 MG tablet Take 1 tablet (40 mg total) by mouth daily.   No facility-administered encounter medications on file as of 02/25/2021.    Patient Active Problem List   Diagnosis Date Noted   Acute pain of left knee 12/13/2019   Special screening for malignant neoplasms, colon 11/17/2017    Arthrofibrosis of knee joint, right s/p Manipulation 04/05/17 04/05/2017   S/P total knee replacement, right 02/15/17 03/01/2017   Primary osteoarthritis of both knees 10/13/2016   Chest pain 06/06/2016   Hyperlipemia 04/16/2013   HTN (hypertension), benign 04/16/2013   Impingement syndrome of right shoulder 03/08/2011    Conditions to be addressed/monitored:HTN and HLD  Care Plan : RN Care Manager Plan of Care  Updates made by [name], RN since 02/25/2021 12:00 AM     Problem: No plan of care established for management of chronic disease states  (HTN, HLD)   Priority: High     Long-Range Goal: Development of plan of care for chronic disease management  (HTN, HLD)   Start Date: 02/25/2021  Expected End Date: 08/24/2021  Priority: High  Note:   Current Barriers:  Knowledge Deficits related to plan of care for management of HTN and HLD  Patient does not have Advanced Directives and declines information Patient reports he lives with spouse and adult son, is independent in all aspects of his care, checks blood pressure once weekly with readings " usually good"  Reports tries to eat healthy, has fast food at times, drinks mostly water, walks 2 x per week.  RNCM Clinical Goal(s):  Patient will verbalize understanding of plan for management of HTN and HLD as evidenced by patient report, review of EHR and  through collaboration with RN Care manager, provider, and care team.   Interventions: 1:1 collaboration with primary care provider regarding development and update of comprehensive plan of care as evidenced by provider attestation and co-signature Inter-disciplinary care team collaboration (see longitudinal  plan of care) Evaluation of current treatment plan related to  self management and patient's adherence to plan as established by provider   Hyperlipidemia:  (Status: New goal. Goal on Track (progressing): YES.) Long Term Goal  Lab Results  Component Value Date   CHOL 194  10/28/2020   HDL 51 10/28/2020   LDLCALC 119 (H) 10/28/2020   TRIG 136 10/28/2020   CHOLHDL 3.8 10/28/2020     Medication review performed; medication list updated in electronic medical record.  Provider established cholesterol goals reviewed; Counseled on importance of regular laboratory monitoring as prescribed; Provided HLD educational materials; Reviewed importance of limiting foods high in cholesterol; Screening for signs and symptoms of depression related to chronic disease state;  Assessed social determinant of health barriers;  Reviewed importance of following heart healthy diet Education provided via My Chart-  heart healthy diet Reviewed avoiding fried foods, trans/ saturated fats  Hypertension Interventions:  (Status:  New goal. and Goal on track:  Yes.) Long Term Goal Last practice recorded BP readings:  BP Readings from Last 3 Encounters:  12/09/20 (!) 142/86  09/15/20 114/62  03/25/20 138/88  Most recent eGFR/CrCl:  Lab Results  Component Value Date   EGFR 75 10/28/2020    No components found for: CRCL  Evaluation of current treatment plan related to hypertension self management and patient's adherence to plan as established by provider Reviewed medications with patient and discussed importance of compliance Discussed plans with patient for ongoing care management follow up and provided patient with direct contact information for care management team Advised patient, providing education and rationale, to monitor blood pressure daily and record, calling PCP for findings outside established parameters Discussed complications of poorly controlled blood pressure such as heart disease, stroke, circulatory complications, vision complications, kidney impairment, sexual dysfunction Reviewed importance of following low sodium diet Education provided via My Chart- low sodium diet   Patient Goals/Self-Care Activities: Take medications as prescribed   Attend all scheduled  provider appointments Perform all self care activities independently  Perform IADL's (shopping, preparing meals, housekeeping, managing finances) independently Call provider office for new concerns or questions  check blood pressure weekly write blood pressure results in a log or diary keep a blood pressure log take blood pressure log to all doctor appointments take medications for blood pressure exactly as prescribed eat more whole grains, fruits and vegetables, lean meats and healthy fats - call for medicine refill 2 or 3 days before it runs out - call doctor with any symptoms you believe are related to your medicine - call doctor when you experience any new symptoms Avoid/ limit fried foods, bake or broil whenever possible Continue walking and getting outdoors- keep up the good work       Plan:Telephone follow up appointment with care management team member scheduled for:  05/20/2021  Jacqlyn Larsen [facility], BSN RN Case Manager [place] [phone]

## 2021-02-25 NOTE — Patient Instructions (Signed)
Visit Information   Thank you for taking time to visit with me today. Please don't hesitate to contact me if I can be of assistance to you before our next scheduled telephone appointment.  Following are the goals we discussed today:  Take medications as prescribed   Attend all scheduled provider appointments Perform all self care activities independently  Perform IADL's (shopping, preparing meals, housekeeping, managing finances) independently Call provider office for new concerns or questions  check blood pressure weekly write blood pressure results in a log or diary keep a blood pressure log take blood pressure log to all doctor appointments take medications for blood pressure exactly as prescribed eat more whole grains, fruits and vegetables, lean meats and healthy fats call for medicine refill 2 or 3 days before it runs out call doctor with any symptoms you believe are related to your medicine call doctor when you experience any new symptoms Avoid/ limit fried foods, bake or broil whenever possible Continue walking and getting outdoors- keep up the good work  Our next appointment is by telephone on 05/20/21 at 3 pm  Please call the care guide team at [phone] if you need to cancel or reschedule your appointment.   If you are experiencing a Mental Health or [place] or need someone to talk to, please call the Suicide and Crisis Lifeline: 988 call the [place] National Suicide Prevention Lifeline: 662-315-6224 or TTY: 585 194 7201 TTY (661)744-9567) to talk to a trained counselor call 1-800-273-TALK (toll free, 24 hour hotline) go to [facility] [place] [phone]) call the [place]: [phone] call 911   Following is a copy of your full care plan:  Care Plan : RN Care Manager Plan of Care  Updates made by [name], RN since 02/25/2021 12:00 AM     Problem: No plan of care  established for management of chronic disease states  (HTN, HLD)   Priority: High     Long-Range Goal: Development of plan of care for chronic disease management  (HTN, HLD)   Start Date: 02/25/2021  Expected End Date: 08/24/2021  Priority: High  Note:   Current Barriers:  Knowledge Deficits related to plan of care for management of HTN and HLD  Patient does not have Advanced Directives and declines information Patient reports he lives with spouse and adult son, is independent in all aspects of his care, checks blood pressure once weekly with readings " usually good"  Reports tries to eat healthy, has fast food at times, drinks mostly water, walks 2 x per week.  RNCM Clinical Goal(s):  Patient will verbalize understanding of plan for management of HTN and HLD as evidenced by patient report, review of EHR and  through collaboration with RN Care manager, provider, and care team.   Interventions: 1:1 collaboration with primary care provider regarding development and update of comprehensive plan of care as evidenced by provider attestation and co-signature Inter-disciplinary care team collaboration (see longitudinal plan of care) Evaluation of current treatment plan related to  self management and patient's adherence to plan as established by provider   Hyperlipidemia:  (Status: New goal. Goal on Track (progressing): YES.) Long Term Goal  Lab Results  Component Value Date   CHOL 194 10/28/2020   HDL 51 10/28/2020   LDLCALC 119 (H) 10/28/2020   TRIG 136 10/28/2020   CHOLHDL 3.8 10/28/2020     Medication review performed; medication list updated in electronic medical record.  Provider established  cholesterol goals reviewed; Counseled on importance of regular laboratory monitoring as prescribed; Provided HLD educational materials; Reviewed importance of limiting foods high in cholesterol; Screening for signs and symptoms of depression related to chronic disease state;  Assessed social  determinant of health barriers;  Reviewed importance of following heart healthy diet Education provided via My Chart-  heart healthy diet Reviewed avoiding fried foods, trans/ saturated fats  Hypertension Interventions:  (Status:  New goal. and Goal on track:  Yes.) Long Term Goal Last practice recorded BP readings:  BP Readings from Last 3 Encounters:  12/09/20 (!) 142/86  09/15/20 114/62  03/25/20 138/88  Most recent eGFR/CrCl:  Lab Results  Component Value Date   EGFR 75 10/28/2020    No components found for: CRCL  Evaluation of current treatment plan related to hypertension self management and patient's adherence to plan as established by provider Reviewed medications with patient and discussed importance of compliance Discussed plans with patient for ongoing care management follow up and provided patient with direct contact information for care management team Advised patient, providing education and rationale, to monitor blood pressure daily and record, calling PCP for findings outside established parameters Discussed complications of poorly controlled blood pressure such as heart disease, stroke, circulatory complications, vision complications, kidney impairment, sexual dysfunction Reviewed importance of following low sodium diet Education provided via My Chart- low sodium diet   Patient Goals/Self-Care Activities: Take medications as prescribed   Attend all scheduled provider appointments Perform all self care activities independently  Perform IADL's (shopping, preparing meals, housekeeping, managing finances) independently Call provider office for new concerns or questions  check blood pressure weekly write blood pressure results in a log or diary keep a blood pressure log take blood pressure log to all doctor appointments take medications for blood pressure exactly as prescribed eat more whole grains, fruits and vegetables, lean meats and healthy fats - call for  medicine refill 2 or 3 days before it runs out - call doctor with any symptoms you believe are related to your medicine - call doctor when you experience any new symptoms Avoid/ limit fried foods, bake or broil whenever possible Continue walking and getting outdoors- keep up the good work       Consent to [employer]: Christopher [name] was given information about Chronic Care Management services including:  CCM service includes personalized support from designated clinical staff supervised by his physician, including individualized plan of care and coordination with other care providers 24/7 contact phone numbers for assistance for urgent and routine care needs. Service will only be billed when office clinical staff spend 20 minutes or more in a month to coordinate care. Only one practitioner may furnish and bill the service in a calendar month. The patient may stop CCM services at any time (effective at the end of the month) by phone call to the office staff. The patient will be responsible for cost sharing (co-pay) of up to 20% of the service fee (after annual deductible is met).  Patient agreed to services and verbal consent obtained.   Patient verbalizes understanding of instructions and care plan provided today and agrees to view in [place]. Active MyChart status confirmed with patient.    Telephone follow up appointment with care management team member scheduled for:  05/20/21 Heart-Healthy Eating Plan Many factors influence your heart (coronary) health, including eating and exercise habits. Coronary risk increases with abnormal blood fat (lipid) levels. Heart-healthy meal planning includes limiting unhealthy fats, increasing healthy fats, and making other  diet and lifestyle changes. What is my plan? Your health care provider may recommend that you: Limit your fat intake to _________% or less of your total calories each day. Limit your saturated fat intake to _________% or less of your  total calories each day. Limit the amount of cholesterol in your diet to less than _________ mg per day. What are tips for following this plan? Cooking Cook foods using methods other than frying. Baking, boiling, grilling, and broiling are all good options. Other ways to reduce fat include: Removing the skin from poultry. Removing all visible fats from meats. Steaming vegetables in water or broth. Meal planning  At meals, imagine dividing your plate into fourths: Fill one-half of your plate with vegetables and green salads. Fill one-fourth of your plate with whole grains. Fill one-fourth of your plate with lean protein foods. Eat 4-5 servings of vegetables per day. One serving equals 1 cup raw or cooked vegetable, or 2 cups raw leafy greens. Eat 4-5 servings of fruit per day. One serving equals 1 medium whole fruit,  cup dried fruit,  cup fresh, frozen, or canned fruit, or  cup 100% fruit juice. Eat more foods that contain soluble fiber. Examples include apples, broccoli, carrots, beans, peas, and barley. Aim to get 25-30 g of fiber per day. Increase your consumption of legumes, nuts, and seeds to 4-5 servings per week. One serving of dried beans or legumes equals  cup cooked, 1 serving of nuts is  cup, and 1 serving of seeds equals 1 tablespoon. Fats Choose healthy fats more often. Choose monounsaturated and polyunsaturated fats, such as olive and canola oils, flaxseeds, walnuts, almonds, and seeds. Eat more omega-3 fats. Choose salmon, mackerel, sardines, tuna, flaxseed oil, and ground flaxseeds. Aim to eat fish at least 2 times each week. Check food labels carefully to identify foods with trans fats or high amounts of saturated fat. Limit saturated fats. These are found in animal products, such as meats, butter, and cream. Plant sources of saturated fats include palm oil, palm kernel oil, and coconut oil. Avoid foods with partially hydrogenated oils in them. These contain trans fats.  Examples are stick margarine, some tub margarines, cookies, crackers, and other baked goods. Avoid fried foods. General information Eat more home-cooked food and less restaurant, buffet, and fast food. Limit or avoid alcohol. Limit foods that are high in starch and sugar. Lose weight if you are overweight. Losing just 5-10% of your body weight can help your overall health and prevent diseases such as diabetes and heart disease. Monitor your salt (sodium) intake, especially if you have high blood pressure. Talk with your health care provider about your sodium intake. Try to incorporate more vegetarian meals weekly. What foods can I eat? Fruits All fresh, canned (in natural juice), or frozen fruits. Vegetables Fresh or frozen vegetables (raw, steamed, roasted, or grilled). Green salads. Grains Most grains. Choose whole wheat and whole grains most of the time. Rice and pasta, including brown rice and pastas made with whole wheat. Meats and other proteins Lean, well-trimmed beef, veal, pork, and lamb. Chicken and [place] without skin. All fish and shellfish. Wild duck, rabbit, pheasant, and venison. Egg whites or low-cholesterol egg substitutes. Dried beans, peas, lentils, and tofu. Seeds and most nuts. Dairy Low-fat or nonfat cheeses, including ricotta and mozzarella. Skim or 1% milk (liquid, powdered, or evaporated). Buttermilk made with low-fat milk. Nonfat or low-fat yogurt. Fats and oils Non-hydrogenated (trans-free) margarines. Vegetable oils, including soybean, sesame, sunflower, olive, peanut, safflower,  corn, canola, and cottonseed. Salad dressings or mayonnaise made with a vegetable oil. Beverages Water (mineral or sparkling). Coffee and tea. Diet carbonated beverages. Sweets and desserts Sherbet, gelatin, and fruit ice. Small amounts of dark chocolate. Limit all sweets and desserts. Seasonings and condiments All seasonings and condiments. The items listed above may not be a  complete list of foods and beverages you can eat. Contact a dietitian for more options. What foods are not recommended? Fruits Canned fruit in heavy syrup. Fruit in cream or butter sauce. Fried fruit. Limit coconut. Vegetables Vegetables cooked in cheese, cream, or butter sauce. Fried vegetables. Grains Breads made with saturated or trans fats, oils, or whole milk. Croissants. Sweet rolls. Donuts. High-fat crackers, such as cheese crackers. Meats and other proteins Fatty meats, such as hot dogs, ribs, sausage, bacon, rib-eye roast or steak. High-fat deli meats, such as salami and bologna. Caviar. Domestic duck and goose. Organ meats, such as liver. Dairy Cream, sour cream, cream cheese, and creamed cottage cheese. Whole-milk cheeses. Whole or 2% milk (liquid, evaporated, or condensed). Whole buttermilk. Cream sauce or high-fat cheese sauce. Whole-milk yogurt. Fats and oils Meat fat, or shortening. Cocoa butter, hydrogenated oils, palm oil, coconut oil, palm kernel oil. Solid fats and shortenings, including bacon fat, salt pork, lard, and butter. Nondairy cream substitutes. Salad dressings with cheese or sour cream. Beverages Regular sodas and any drinks with added sugar. Sweets and desserts Frosting. Pudding. Cookies. Cakes. Pies. Milk chocolate or white chocolate. Buttered syrups. Full-fat ice cream or ice cream drinks. The items listed above may not be a complete list of foods and beverages to avoid. Contact a dietitian for more information. Summary Heart-healthy meal planning includes limiting unhealthy fats, increasing healthy fats, and making other diet and lifestyle changes. Lose weight if you are overweight. Losing just 5-10% of your body weight can help your overall health and prevent diseases such as diabetes and heart disease. Focus on eating a balance of foods, including fruits and vegetables, low-fat or nonfat dairy, lean protein, nuts and legumes, whole grains, and heart-healthy  oils and fats. This information is not intended to replace advice given to you by your health care provider. Make sure you discuss any questions you have with your health care provider. Document Revised: 05/29/2020 Document Reviewed: 05/29/2020 Elsevier Patient Education  2022 [place]. Low-Sodium Eating Plan Sodium, which is an element that makes up salt, helps you maintain a healthy balance of fluids in your body. Too much sodium can increase your blood pressure and cause fluid and waste to be held in your body. Your health care provider or dietitian may recommend following this plan if you have high blood pressure (hypertension), kidney disease, liver disease, or heart failure. Eating less sodium can help lower your blood pressure, reduce swelling, and protect your heart, liver, and kidneys. What are tips for following this plan? Reading food labels The Nutrition Facts label lists the amount of sodium in one serving of the food. If you eat more than one serving, you must multiply the listed amount of sodium by the number of servings. Choose foods with less than 140 mg of sodium per serving. Avoid foods with 300 mg of sodium or more per serving. Shopping  Look for lower-sodium products, often labeled as "low-sodium" or "no salt added." Always check the sodium content, even if foods are labeled as "unsalted" or "no salt added." Buy fresh foods. Avoid canned foods and pre-made or frozen meals. Avoid canned, cured, or processed meats. Buy  breads that have less than 80 mg of sodium per slice. Cooking  Eat more home-cooked food and less restaurant, buffet, and fast food. Avoid adding salt when cooking. Use salt-free seasonings or herbs instead of table salt or sea salt. Check with your health care provider or pharmacist before using salt substitutes. Cook with plant-based oils, such as canola, sunflower, or olive oil. Meal planning When eating at a restaurant, ask that your food be  prepared with less salt or no salt, if possible. Avoid dishes labeled as brined, pickled, cured, smoked, or made with soy sauce, miso, or teriyaki sauce. Avoid foods that contain MSG (monosodium glutamate). MSG is sometimes added to [place] food, bouillon, and some canned foods. Make meals that can be grilled, baked, poached, roasted, or steamed. These are generally made with less sodium. General information Most people on this plan should limit their sodium intake to 1,500-2,000 mg (milligrams) of sodium each day. What foods should I eat? Fruits Fresh, frozen, or canned fruit. Fruit juice. Vegetables Fresh or frozen vegetables. "No salt added" canned vegetables. "No salt added" tomato sauce and paste. Low-sodium or reduced-sodium tomato and vegetable juice. Grains Low-sodium cereals, including oats, puffed wheat and rice, and shredded wheat. Low-sodium crackers. Unsalted rice. Unsalted pasta. Low-sodium bread. Whole-grain breads and whole-grain pasta. Meats and other proteins Fresh or frozen (no salt added) meat, poultry, seafood, and fish. Low-sodium canned tuna and salmon. Unsalted nuts. Dried peas, beans, and lentils without added salt. Unsalted canned beans. Eggs. Unsalted nut butters. Dairy Milk. Soy milk. Cheese that is naturally low in sodium, such as ricotta cheese, fresh mozzarella, or Swiss cheese. Low-sodium or reduced-sodium cheese. Cream cheese. Yogurt. Seasonings and condiments Fresh and dried herbs and spices. Salt-free seasonings. Low-sodium mustard and ketchup. Sodium-free salad dressing. Sodium-free light mayonnaise. Fresh or refrigerated horseradish. Lemon juice. Vinegar. Other foods Homemade, reduced-sodium, or low-sodium soups. Unsalted popcorn and pretzels. Low-salt or salt-free chips. The items listed above may not be a complete list of foods and beverages you can eat. Contact a dietitian for more information. What foods should I avoid? Vegetables Sauerkraut, pickled  vegetables, and relishes. Olives. [place] fries. Onion rings. Regular canned vegetables (not low-sodium or reduced-sodium). Regular canned tomato sauce and paste (not low-sodium or reduced-sodium). Regular tomato and vegetable juice (not low-sodium or reduced-sodium). Frozen vegetables in sauces. Grains Instant hot cereals. Bread stuffing, pancake, and biscuit mixes. Croutons. Seasoned rice or pasta mixes. Noodle soup cups. Boxed or frozen macaroni and cheese. Regular salted crackers. Self-rising flour. Meats and other proteins Meat or fish that is salted, canned, smoked, spiced, or pickled. Precooked or cured meat, such as sausages or meat loaves. Berniece Salines. Ham. Pepperoni. Hot dogs. Corned beef. Chipped beef. Salt pork. Jerky. Pickled herring. Anchovies and sardines. Regular canned tuna. Salted nuts. Dairy Processed cheese and cheese spreads. Hard cheeses. Cheese curds. Blue cheese. Feta cheese. String cheese. Regular cottage cheese. Buttermilk. Canned milk. Fats and oils Salted butter. Regular margarine. Ghee. Bacon fat. Seasonings and condiments Onion salt, garlic salt, seasoned salt, table salt, and sea salt. Canned and packaged gravies. Worcestershire sauce. Tartar sauce. Barbecue sauce. Teriyaki sauce. Soy sauce, including reduced-sodium. Steak sauce. Fish sauce. Oyster sauce. Cocktail sauce. Horseradish that you find on the shelf. Regular ketchup and mustard. Meat flavorings and tenderizers. Bouillon cubes. Hot sauce. Pre-made or packaged marinades. Pre-made or packaged taco seasonings. Relishes. Regular salad dressings. Salsa. Other foods Salted popcorn and pretzels. Corn chips and puffs. Potato and tortilla chips. Canned or dried soups. Pizza. Frozen entrees  and pot pies. The items listed above may not be a complete list of foods and beverages you should avoid. Contact a dietitian for more information. Summary Eating less sodium can help lower your blood pressure, reduce swelling, and protect  your heart, liver, and kidneys. Most people on this plan should limit their sodium intake to 1,500-2,000 mg (milligrams) of sodium each day. Canned, boxed, and frozen foods are high in sodium. Restaurant foods, fast foods, and pizza are also very high in sodium. You also get sodium by adding salt to food. Try to cook at home, eat more fresh fruits and vegetables, and eat less fast food and canned, processed, or prepared foods. This information is not intended to replace advice given to you by your health care provider. Make sure you discuss any questions you have with your health care provider. Document Revised: 02/23/2019 Document Reviewed: 12/20/2018 Elsevier Patient Education  2022 [employer].

## 2021-03-03 DIAGNOSIS — E785 Hyperlipidemia, unspecified: Secondary | ICD-10-CM | POA: Diagnosis not present

## 2021-03-03 DIAGNOSIS — I10 Essential (primary) hypertension: Secondary | ICD-10-CM

## 2021-05-20 ENCOUNTER — Ambulatory Visit (INDEPENDENT_AMBULATORY_CARE_PROVIDER_SITE_OTHER): Payer: Medicare HMO | Admitting: *Deleted

## 2021-05-20 DIAGNOSIS — E785 Hyperlipidemia, unspecified: Secondary | ICD-10-CM

## 2021-05-20 DIAGNOSIS — I10 Essential (primary) hypertension: Secondary | ICD-10-CM

## 2021-05-20 NOTE — Patient Instructions (Signed)
Visit Information ? ?Thank you for taking time to visit with me today. Please don't hesitate to contact me if I can be of assistance to you before our next scheduled telephone appointment. ? ?Following are the goals we discussed today:  ?Take medications as prescribed   ?Attend all scheduled provider appointments ?Perform all self care activities independently  ?Perform IADL's (shopping, preparing meals, housekeeping, managing finances) independently ?Call provider office for new concerns or questions  ?check blood pressure weekly ?write blood pressure results in a log or diary ?keep a blood pressure log ?take blood pressure log to all doctor appointments ?take medications for blood pressure exactly as prescribed ?report new symptoms to your doctor ?eat more whole grains, fruits and vegetables, lean meats and healthy fats ?call for medicine refill 2 or 3 days before it runs out ?take all medications exactly as prescribed ?call doctor with any symptoms you believe are related to your medicine ?call doctor when you experience any new symptoms ?go to all doctor appointments as scheduled ?adhere to prescribed diet: heart healthy, low sodium ?Be mindful of foods high in cholesterol, trans/ saturated fats to avoid/ limit, read labels ?Continue walking and getting outdoors- keep up the good work ? ?Our next appointment is by telephone on 08/12/21 at 1045 am ? ?Please call the care guide team at [phone] if you need to cancel or reschedule your appointment.  ? ?If you are experiencing a Mental Health or [place] or need someone to talk to, please call the Suicide and Crisis Lifeline: 988 ?call the [place] National Suicide Prevention Lifeline: 952-279-9576 or TTY: (902) 300-7845 TTY 937-502-7602) to talk to a trained counselor ?call 1-800-273-TALK (toll free, 24 hour hotline) ?go to [facility] [address] [place] [phone]) ?call the [facility]: [phone] ?call 911  ? ?Patient verbalizes understanding of instructions and care plan provided today and agrees to view in [place]. Active MyChart status confirmed with patient.   ? ?Jacqlyn Larsen RNC, BSN ?RN Case Manager ?[place] ?[phone] ? ?

## 2021-05-20 NOTE — Chronic Care Management (AMB) (Signed)
?Chronic Care Management  ? ?CCM RN Visit Note ? ?05/20/2021 ?Name: Christopher [name] MRN: [id number] DOB: 1960/03/26 ? ?Subjective: ?Christopher [name] is a 61 y.o. year old male who is a primary care patient of [name], [name], MD. The care management team was consulted for assistance with disease management and care coordination needs.   ? ?Engaged with patient by telephone for follow up visit in response to provider referral for case management and/or care coordination services.  ? ?Consent to Services:  ?The patient was given information about Chronic Care Management services, agreed to services, and gave verbal consent prior to initiation of services.  Please see initial visit note for detailed documentation.  ? ?Patient agreed to services and verbal consent obtained.  ? ?Assessment: Review of patient past medical history, allergies, medications, health status, including review of consultants reports, laboratory and other test data, was performed as part of comprehensive evaluation and provision of chronic care management services.  ? ?SDOH (Social Determinants of Health) assessments and interventions performed:   ? ?CCM Care Plan ? ?Allergies  ?Allergen Reactions  ? Hydrocodone Itching  ? ? ?Outpatient Encounter Medications as of 05/20/2021  ?Medication Sig  ? amLODipine (NORVASC) 10 MG tablet TAKE 1 TABLET(10 MG) BY MOUTH DAILY  ? atorvastatin (LIPITOR) 10 MG tablet TAKE 1 TABLET(10 MG) BY MOUTH DAILY  ? famotidine (PEPCID) 40 MG tablet Take 1 tablet (40 mg total) by mouth daily.  ? ?No facility-administered encounter medications on file as of 05/20/2021.  ? ? ?Patient Active Problem List  ? Diagnosis Date Noted  ? Acute pain of left knee 12/13/2019  ? Special screening for malignant neoplasms, colon 11/17/2017  ? Arthrofibrosis of knee joint, right s/p Manipulation 04/05/17 04/05/2017  ? S/P total knee replacement, right 02/15/17 03/01/2017  ? Primary osteoarthritis of both knees 10/13/2016  ? Chest pain 06/06/2016  ?  Hyperlipemia 04/16/2013  ? HTN (hypertension), benign 04/16/2013  ? Impingement syndrome of right shoulder 03/08/2011  ? ? ?Conditions to be addressed/monitored:HTN and HLD ? ?Care Plan : RN Care Manager Plan of Care  ?Updates made by [name], RN since 05/20/2021 12:00 AM  ?  ? ?Problem: No plan of care established for management of chronic disease states  (HTN, HLD)   ?Priority: High  ?  ? ?Long-Range Goal: Development of plan of care for chronic disease management  (HTN, HLD)   ?Start Date: 02/25/2021  ?Expected End Date: 11/16/2021  ?Priority: High  ?Note:   ?Current Barriers:  ?Knowledge Deficits related to plan of care for management of HTN and HLD  ?Patient does not have Advanced Directives and declines information ?Patient reports he lives with spouse and adult son, is independent in all aspects of his care, checks blood pressure once weekly with readings " usually good"  Reports tries to eat healthy, has fast food at times, drinks mostly water, walks 2 x per week. No new concerns reported today. ? ?RNCM Clinical Goal(s):  ?Patient will verbalize understanding of plan for management of HTN and HLD as evidenced by patient report, review of EHR and  through collaboration with RN Care manager, provider, and care team.  ? ?Interventions: ?1:1 collaboration with primary care provider regarding development and update of comprehensive plan of care as evidenced by provider attestation and co-signature ?Inter-disciplinary care team collaboration (see longitudinal plan of care) ?Evaluation of current treatment plan related to  self management and patient's adherence to plan as established by provider ? ? ?Hyperlipidemia:  (Status:  New goal. Goal on Track (progressing): YES.) Long Term Goal  ?Lab Results  ?Component Value Date  ? CHOL 194 10/28/2020  ? HDL 51 10/28/2020  ? LDLCALC 119 (H) 10/28/2020  ? TRIG 136 10/28/2020  ? CHOLHDL 3.8 10/28/2020  ?  ? ?Medication review performed; medication list updated in  electronic medical record.  ?Provider established cholesterol goals reviewed; ?Counseled on importance of regular laboratory monitoring as prescribed; ?Reviewed importance of limiting foods high in cholesterol; ?Reinforced importance of following heart healthy diet ?Reinforced avoiding fried foods, trans/ saturated fats, reading labels ? ?Hypertension Interventions:  (Status:  New goal. and Goal on track:  Yes.) Long Term Goal ?Last practice recorded BP readings:  ?BP Readings from Last 3 Encounters:  ?12/09/20 (!) 142/86  ?09/15/20 114/62  ?03/25/20 138/88  ?Most recent eGFR/CrCl:  ?Lab Results  ?Component Value Date  ? EGFR 75 10/28/2020  ?  No components found for: CRCL ? ?Evaluation of current treatment plan related to hypertension self management and patient's adherence to plan as established by provider ?Provided education to patient re: stroke prevention, s/s of heart attack and stroke ?Reviewed medications with patient and discussed importance of compliance ?Advised patient, providing education and rationale, to monitor blood pressure daily and record, calling PCP for findings outside established parameters ?Reinforced importance of following low sodium diet ? ?Patient Goals/Self-Care Activities: ?Take medications as prescribed   ?Attend all scheduled provider appointments ?Perform all self care activities independently  ?Perform IADL's (shopping, preparing meals, housekeeping, managing finances) independently ?Call provider office for new concerns or questions  ?check blood pressure weekly ?write blood pressure results in a log or diary ?keep a blood pressure log ?take blood pressure log to all doctor appointments ?take medications for blood pressure exactly as prescribed ?report new symptoms to your doctor ?eat more whole grains, fruits and vegetables, lean meats and healthy fats ?- call for medicine refill 2 or 3 days before it runs out ?- take all medications exactly as prescribed ?- call doctor with any  symptoms you believe are related to your medicine ?- call doctor when you experience any new symptoms ?- go to all doctor appointments as scheduled ?- adhere to prescribed diet: heart healthy, low sodium ?Be mindful of foods high in cholesterol, trans/ saturated fats to avoid/ limit, read labels ?Continue walking and getting outdoors- keep up the good work ? ? ?  ? ? ?Plan:Telephone follow up appointment with care management team member scheduled for:  08/12/21 ? ?Christopher [name] RNC, BSN ?RN Case Manager ?[place] ?[phone] ? ? ? ? ? ? ? ? ? ?

## 2021-05-27 ENCOUNTER — Other Ambulatory Visit: Payer: Self-pay | Admitting: Family Medicine

## 2021-05-31 DIAGNOSIS — E785 Hyperlipidemia, unspecified: Secondary | ICD-10-CM

## 2021-05-31 DIAGNOSIS — I10 Essential (primary) hypertension: Secondary | ICD-10-CM

## 2021-06-04 ENCOUNTER — Ambulatory Visit: Payer: Medicare HMO | Admitting: Orthopedic Surgery

## 2021-06-04 ENCOUNTER — Encounter: Payer: Self-pay | Admitting: Orthopedic Surgery

## 2021-06-04 ENCOUNTER — Ambulatory Visit (INDEPENDENT_AMBULATORY_CARE_PROVIDER_SITE_OTHER): Payer: Medicare HMO

## 2021-06-04 VITALS — BP 139/77 | HR 73 | Ht 71.0 in | Wt 197.6 lb

## 2021-06-04 DIAGNOSIS — G8929 Other chronic pain: Secondary | ICD-10-CM

## 2021-06-04 DIAGNOSIS — M25511 Pain in right shoulder: Secondary | ICD-10-CM

## 2021-06-04 NOTE — Patient Instructions (Addendum)
Take tylenol 500 mg every 6 hrs for pain  ? ?While we are working on your approval for MRI please go ahead and call to schedule your appointment with [facility] within at least one (1) week.  ? ?Central Scheduling ?[phone]  ?

## 2021-06-04 NOTE — Progress Notes (Addendum)
Chief Complaint  ?Patient presents with  ? Shoulder Pain  ?  Right shld pain for several years, decreased ROM, can't sleep on the right side.  + night pain takes tylenol prn  ? ?61 year old male with chronic right shoulder pain we saw him 4 years ago it is gotten worse he cannot sleep on his right side.  It is hard for him to lift his arm away from his body and he reports weakness without any new trauma.  It appears that the pain has been progressing as as the weakness ? ?Review of systems no shortness of breath chest pain numbness or tingling ? ?Past Medical History:  ?Diagnosis Date  ? Arthritis   ? Atrial fibrillation ([place])   ? a. occuring in the post-operative setting in 02/2017  ? GERD (gastroesophageal reflux disease)   ? HTN (hypertension)   ? Hyperlipemia   ? ?Past Surgical History:  ?Procedure Laterality Date  ? COLONOSCOPY N/A 07/13/2018  ? Procedure: COLONOSCOPY;  Surgeon: Rogene Houston, MD;  Location: [facility];  Service: Endoscopy;  Laterality: N/A;  830  ? EXAM UNDER ANESTHESIA WITH MANIPULATION OF KNEE Right 04/05/2017  ? Procedure: EXAM UNDER ANESTHESIA WITH MANIPULATION OF KNEE;  Surgeon: Carole Civil, MD;  Location: [facility];  Service: Orthopedics;  Laterality: Right;  ? HERNIA REPAIR Right   ? KNEE ARTHROSCOPY  right knee  ? POLYPECTOMY  07/13/2018  ? Procedure: POLYPECTOMY;  Surgeon: Rogene Houston, MD;  Location: [facility];  Service: Endoscopy;;  ? TOTAL KNEE ARTHROPLASTY Right 02/15/2017  ? Procedure: TOTAL KNEE ARTHROPLASTY;  Surgeon: Carole Civil, MD;  Location: [facility];  Service: Orthopedics;  Laterality: Right;  ? ? ?BP 139/77   Pulse 73   Ht '5\' 11"'$  (1.803 m)   Wt 197 lb 9.6 oz (89.6 kg)   BMI 27.56 kg/m?  ? ?[name] awake alert and oriented x3 his mood and affect are normal ? ?He has a lean muscular ectomorphic build ? ?He has weakness in abduction and flexion with limited active range of motion.  I can passively abduct him out to 90 degrees and flexion of 225 degrees  with pain he has a weak abduction test and a weak empty can test ? ?He also has normal strength in the left shoulder when compared ? ?Fortunately he has no external rotation weakness with his arm at his side ? ?Sensation is normal ? ?Pulse perfusion color temperature normal in the right arm no evidence of any lymphadenopathy in the axilla or supraclavicular region ? ?Imaging of the right shoulder shows a normal glenohumeral joint with some mild distal clavicle hypertrophy and joint space narrowing consistent with osteoarthritis ? ?Given a cortisone injection in the subacromial space ? ?Ordered an MRI of his shoulder he probably has a rotator cuff tear is probably chronic question is is it retracted is it repairable is it amenable to reverse replacement or if the cuff is intact does need a total shoulder ? ? ?Procedure note the subacromial injection shoulder RIGHT  ? ? ?Verbal consent was obtained to inject the  RIGHT   Shoulder ? ?Timeout was completed to confirm the injection site is a subacromial space of the  RIGHT  shoulder ? ? ?Medication used Depo-Medrol 40 mg and lidocaine 1% 3 cc ? ?Anesthesia was provided by ethyl chloride ? ?The injection was performed in the RIGHT  posterior subacromial space. After pinning the skin with alcohol and anesthetized the skin with ethyl chloride  the subacromial space was injected using a 20-gauge needle. There were no complications ? ?Sterile dressing was applied. ?  ? ? ? ? ? ? ? ?

## 2021-06-10 ENCOUNTER — Other Ambulatory Visit: Payer: Self-pay

## 2021-06-10 MED ORDER — ATORVASTATIN CALCIUM 10 MG PO TABS: ORAL_TABLET | ORAL | 0 refills | Status: DC

## 2021-06-19 ENCOUNTER — Ambulatory Visit: Payer: Medicare HMO | Admitting: Orthopedic Surgery

## 2021-06-23 ENCOUNTER — Ambulatory Visit (HOSPITAL_COMMUNITY)
Admission: RE | Admit: 2021-06-23 | Discharge: 2021-06-23 | Disposition: A | Discharge status: Home / Self Care | Payer: Medicare HMO | Source: Ambulatory Visit | Attending: Orthopedic Surgery | Admitting: Orthopedic Surgery

## 2021-06-23 DIAGNOSIS — M25511 Pain in right shoulder: Secondary | ICD-10-CM | POA: Insufficient documentation

## 2021-06-23 DIAGNOSIS — G8929 Other chronic pain: Secondary | ICD-10-CM | POA: Insufficient documentation

## 2021-06-23 DIAGNOSIS — M19011 Primary osteoarthritis, right shoulder: Secondary | ICD-10-CM | POA: Diagnosis not present

## 2021-06-26 ENCOUNTER — Ambulatory Visit: Payer: Medicare HMO | Admitting: Orthopedic Surgery

## 2021-06-26 DIAGNOSIS — M19011 Primary osteoarthritis, right shoulder: Secondary | ICD-10-CM | POA: Diagnosis not present

## 2021-06-26 DIAGNOSIS — M19019 Primary osteoarthritis, unspecified shoulder: Secondary | ICD-10-CM

## 2021-06-26 NOTE — Progress Notes (Signed)
FOLLOW UP   Encounter Diagnosis  Name Primary?   Arthritis, shoulder region right  Yes     Chief Complaint  Patient presents with   Shoulder Pain    RT Review MRI  Feels better with injection     Christopher [name] had an injection in his right shoulder and we proceeded with an MRI.  His MRI was able to review and it shows severe arthritis of the right shoulder which is not seen on plain film.  I reviewed the images with him he does not have a cuff tear.  I have personally reviewed these images and that is my own interpretation  I reviewed this with him and he is not ready to go ahead with shoulder arthroplasty.  I think a standard total shoulder would be fine since he has an intact rotator cuff  He will think about this.  I let him know about our shoulder specialist and that I could refer him in-house and if surgery needed I could do the surgery with our doctor here.  He was comfortable with that and I will see him on an as-needed basis for injections as needed.  Chronic problems stable, outside imaging read, risk of progression likely

## 2021-07-21 ENCOUNTER — Other Ambulatory Visit: Payer: Self-pay | Admitting: Family Medicine

## 2021-07-22 ENCOUNTER — Other Ambulatory Visit: Payer: Self-pay

## 2021-08-11 ENCOUNTER — Telehealth: Payer: Self-pay | Admitting: Orthopedic Surgery

## 2021-08-11 NOTE — Telephone Encounter (Signed)
Patient called to relay he has injured his right knee - same knee he had knee replacement on, and manipulation done, in 2019 by Dr Aline Brochure. Discussed appointment, however, due to needing immediate appointment, he will go to urgent care, then will call back regarding any follow up needed.

## 2021-08-12 ENCOUNTER — Emergency Department (HOSPITAL_COMMUNITY)
Admission: EM | Admit: 2021-08-12 | Discharge: 2021-08-12 | Disposition: A | Discharge status: Home / Self Care | Payer: Medicare HMO | Attending: Emergency Medicine | Admitting: Emergency Medicine

## 2021-08-12 ENCOUNTER — Telehealth: Payer: Self-pay

## 2021-08-12 ENCOUNTER — Telehealth: Payer: Self-pay | Admitting: *Deleted

## 2021-08-12 ENCOUNTER — Emergency Department (HOSPITAL_COMMUNITY): Payer: Medicare HMO

## 2021-08-12 ENCOUNTER — Other Ambulatory Visit: Payer: Self-pay

## 2021-08-12 ENCOUNTER — Encounter (HOSPITAL_COMMUNITY): Payer: Self-pay | Admitting: *Deleted

## 2021-08-12 DIAGNOSIS — S86911A Strain of unspecified muscle(s) and tendon(s) at lower leg level, right leg, initial encounter: Secondary | ICD-10-CM | POA: Diagnosis not present

## 2021-08-12 DIAGNOSIS — Y9354 Activity, bowling: Secondary | ICD-10-CM | POA: Diagnosis not present

## 2021-08-12 DIAGNOSIS — E119 Type 2 diabetes mellitus without complications: Secondary | ICD-10-CM | POA: Diagnosis not present

## 2021-08-12 DIAGNOSIS — S76911A Strain of unspecified muscles, fascia and tendons at thigh level, right thigh, initial encounter: Secondary | ICD-10-CM | POA: Diagnosis not present

## 2021-08-12 DIAGNOSIS — W1840XA Slipping, tripping and stumbling without falling, unspecified, initial encounter: Secondary | ICD-10-CM | POA: Insufficient documentation

## 2021-08-12 DIAGNOSIS — Z96651 Presence of right artificial knee joint: Secondary | ICD-10-CM | POA: Diagnosis not present

## 2021-08-12 DIAGNOSIS — Y92007 Garden or yard of unspecified non-institutional (private) residence as the place of occurrence of the external cause: Secondary | ICD-10-CM | POA: Insufficient documentation

## 2021-08-12 DIAGNOSIS — S8991XA Unspecified injury of right lower leg, initial encounter: Secondary | ICD-10-CM | POA: Diagnosis not present

## 2021-08-12 DIAGNOSIS — S79921A Unspecified injury of right thigh, initial encounter: Secondary | ICD-10-CM | POA: Diagnosis present

## 2021-08-12 DIAGNOSIS — T148XXA Other injury of unspecified body region, initial encounter: Secondary | ICD-10-CM

## 2021-08-12 DIAGNOSIS — M25461 Effusion, right knee: Secondary | ICD-10-CM | POA: Diagnosis not present

## 2021-08-12 NOTE — ED Provider Notes (Signed)
[facility] Provider Note   CSN: [id number] Arrival date & time: 08/12/21  1157     History  Chief Complaint  Patient presents with   Knee Pain    Christopher [name] is a 61 y.o. male.   Knee Pain Associated symptoms: no back pain and no neck pain        Christopher [name] is a 61 y.o. male who presents to the Emergency Department complaining of pain of his right thigh and knee x4 days.  He states he was working in the yard and slipped causing his right leg and knee to "bend backwards" describes pain to his anterior right thigh that worsens with movement, improves at rest.  Concerned that he damaged the hardware in his knee.  Denies head, neck or back pain.  No LOC or dizziness.   Home Medications Prior to Admission medications   Medication Sig Start Date End Date Taking? Authorizing Provider  acetaminophen (TYLENOL) 325 MG tablet Take 650 mg by mouth every 6 (six) hours as needed.    [provider]  amLODipine (NORVASC) 10 MG tablet TAKE 1 TABLET(10 MG) BY MOUTH DAILY 07/21/21   Kathyrn Drown, MD  atorvastatin (LIPITOR) 10 MG tablet TAKE 1 TABLET(10 MG) BY MOUTH DAILY 06/10/21   Kathyrn Drown, MD  famotidine (PEPCID) 40 MG tablet Take 1 tablet (40 mg total) by mouth daily. 09/05/18   Kathyrn Drown, MD      Allergies    Hydrocodone    Review of Systems   Review of Systems  Eyes:  Negative for visual disturbance.  Respiratory:  Negative for shortness of breath.   Cardiovascular:  Negative for chest pain.  Gastrointestinal:  Negative for abdominal pain, nausea and vomiting.  Musculoskeletal:  Positive for arthralgias (right knee pain and right thigh pain). Negative for back pain and neck pain.  Skin:  Negative for color change and wound.  Neurological:  Negative for dizziness, syncope, weakness, numbness and headaches.    Physical Exam Updated Vital Signs BP (!) 152/91 (BP Location: Right Arm)   Pulse 77   Temp 97.7 F (36.5 C) (Oral)    Resp 20   Ht '5\' 11"'$  (1.803 m)   SpO2 95%   BMI 27.56 kg/m  Physical Exam Vitals and nursing note reviewed.  Constitutional:      General: [name] not in acute distress.    Appearance: Normal appearance. [name] not toxic-appearing.  HENT:     Head: Atraumatic.  Cardiovascular:     Rate and Rhythm: Normal rate and regular rhythm.     Pulses: Normal pulses.  Pulmonary:     Effort: Pulmonary effort is normal.  Musculoskeletal:        General: Tenderness and signs of injury present. No swelling. Normal range of motion.     Cervical back: Normal range of motion. No tenderness.     Right knee: No swelling, deformity, effusion, erythema or bony tenderness. Normal range of motion.     Right lower leg: No edema.     Left lower leg: No edema.     Comments: Ttp of anterior right upper leg.  No edema or bruising.  Well healed surgical scar of the right anterior knee.  Pt able to ROM w/o difficulty.  No erythema or edema of the joint  Skin:    General: Skin is warm.     Capillary Refill: Capillary refill takes less than 2 seconds.  Neurological:  General: No focal deficit present.     Mental Status: [name] alert.     Sensory: No sensory deficit.     Motor: No weakness.     ED Results / Procedures / Treatments   Labs (all labs ordered are listed, but only abnormal results are displayed) Labs Reviewed - No data to display  EKG None  Radiology DG Knee Complete 4 Views Right  Result Date: 08/12/2021 CLINICAL DATA:  Injury EXAM: RIGHT KNEE - COMPLETE 4 VIEW COMPARISON:  Knee radiograph dated February 27, 2018 FINDINGS: Prior total right knee replacement. Hardware is intact. No evidence of hardware fracture or lucency. Small joint effusion and soft tissue edema, similar to prior. IMPRESSION: Prior total right knee replacement with no acute abnormality. Electronically Signed   By: Yetta Glassman M.D.   On: 08/12/2021 12:33    Procedures Procedures    Medications Ordered in  ED Medications - No data to display  ED Course/ Medical Decision Making/ A&P                           Medical Decision Making Amount and/or Complexity of Data Reviewed Radiology: ordered.   Pt here for eval of right thigh pain after mechanical fall.  Hx of total knee replacement and he concerned about damage to hardware.  Pain likely MSK.  XR ray of knee w/o acute finding.  Pt reassured.  Agrees to symptomatic tx and ortho f/u in 1-2 weeks if needed.          Final Clinical Impression(s) / ED Diagnoses Final diagnoses:  Muscle strain    Rx / DC Orders ED Discharge Orders     None         Kem Parkinson, PA-C 08/14/21 1141    Godfrey Pick, MD 08/14/21 [phone]

## 2021-08-12 NOTE — ED Triage Notes (Signed)
Pt in c/o R knee pain onset x 4 days with injury while working in the yard, pt states, "my knee bent back and I had a total knee replacement on it four years ago." Pt ambulatory with cane, A&O x4

## 2021-08-12 NOTE — Telephone Encounter (Signed)
  Care Management   Follow Up Note   08/12/2021 Name: Christopher [name] MRN: [id number] DOB: 01-15-1961   Referred by: Kathyrn Drown, MD Reason for referral : Care Coordination (HTN, HLD)   An unsuccessful telephone outreach was attempted today. The patient was referred to the case management team for assistance with care management and care coordination.   Follow Up Plan: Telephone follow up appointment with care management team member scheduled for: upon care guide rescheduling.  Jacqlyn Larsen [facility], BSN RN Case Manager [facility] [phone]

## 2021-08-12 NOTE — Discharge Instructions (Signed)
Apply ice packs on and off to the muscles of your right leg.  Continue to use your cane while standing or walking.  Tylenol or ibuprofen if needed for pain.  Follow-up with your orthopedic provider in 1 week for recheck if not improving.

## 2021-08-25 ENCOUNTER — Telehealth: Payer: Self-pay | Admitting: *Deleted

## 2021-08-25 NOTE — Chronic Care Management (AMB) (Signed)
  Chronic Care Management Note  08/25/2021 Name: Christopher [name] MRN: [id number] DOB: 02-13-1960  Christopher [name] [name] is a 61 y.o. year old male who is a primary care patient of [name], [name], MD and is actively engaged with the care management team. I reached out to [name] by phone today to assist with re-scheduling a follow up visit with the RN Case Manager  Follow up plan: Patient declines further follow up and engagement by the care management team. Appropriate care team members and provider have been notified via electronic communication. The care management team is available to follow up with the patient after provider conversation with the patient regarding recommendation for care management engagement and subsequent re-referral to the care management team.    [place]  Direct Dial: [phone]

## 2021-08-27 ENCOUNTER — Ambulatory Visit: Payer: Self-pay | Admitting: *Deleted

## 2021-08-27 DIAGNOSIS — I10 Essential (primary) hypertension: Secondary | ICD-10-CM

## 2021-08-27 DIAGNOSIS — E785 Hyperlipidemia, unspecified: Secondary | ICD-10-CM

## 2021-08-27 NOTE — Chronic Care Management (AMB) (Signed)
   08/27/2021  Christopher [name] [birth year] [id number]   Per message from care guide 08/25/21, pt does not want to continue with care management services, care plan updated and resolved, case closed.  Jacqlyn Larsen [facility], BSN RN Case Manager [facility] [phone]

## 2021-09-03 ENCOUNTER — Other Ambulatory Visit: Payer: Self-pay | Admitting: Family Medicine

## 2021-09-04 NOTE — Telephone Encounter (Signed)
Sent my chart message to schedule appointment 09/04/21

## 2021-09-14 ENCOUNTER — Other Ambulatory Visit: Payer: Self-pay | Admitting: Family Medicine

## 2021-09-16 ENCOUNTER — Other Ambulatory Visit: Payer: Self-pay | Admitting: Family Medicine

## 2021-09-28 ENCOUNTER — Ambulatory Visit: Payer: Medicare HMO | Admitting: Orthopedic Surgery

## 2021-10-01 ENCOUNTER — Other Ambulatory Visit: Payer: Self-pay | Admitting: Family Medicine

## 2021-10-07 ENCOUNTER — Ambulatory Visit (INDEPENDENT_AMBULATORY_CARE_PROVIDER_SITE_OTHER): Payer: Medicare HMO | Admitting: Family Medicine

## 2021-10-07 VITALS — BP 136/84 | HR 65 | Temp 98.2°F | Ht 71.0 in | Wt 196.0 lb

## 2021-10-07 DIAGNOSIS — Z79899 Other long term (current) drug therapy: Secondary | ICD-10-CM

## 2021-10-07 DIAGNOSIS — E785 Hyperlipidemia, unspecified: Secondary | ICD-10-CM | POA: Diagnosis not present

## 2021-10-07 DIAGNOSIS — I10 Essential (primary) hypertension: Secondary | ICD-10-CM | POA: Diagnosis not present

## 2021-10-07 DIAGNOSIS — Z125 Encounter for screening for malignant neoplasm of prostate: Secondary | ICD-10-CM | POA: Diagnosis not present

## 2021-10-07 MED ORDER — FAMOTIDINE 40 MG PO TABS
40.0000 mg | ORAL_TABLET | Freq: Every day | ORAL | 1 refills | Status: DC
Start: 2021-10-07 — End: 2022-04-06

## 2021-10-07 MED ORDER — AMLODIPINE BESYLATE 10 MG PO TABS: ORAL_TABLET | ORAL | 1 refills | Status: DC

## 2021-10-07 MED ORDER — ATORVASTATIN CALCIUM 10 MG PO TABS: ORAL_TABLET | ORAL | 1 refills | Status: DC

## 2021-10-07 NOTE — Progress Notes (Signed)
   Subjective:    Patient ID: Christopher [name], male    DOB: [birth year], 61 y.o.   MRN: [id number]  Hypertension This is a chronic problem. Treatments tried: amlodipine.  Has been out of meds for a couple of days HTN (hypertension), benign - Plan: Hemoglobin A1c, PSA, Lipid panel, Hepatic Function Panel, Basic metabolic panel  Hyperlipidemia, unspecified hyperlipidemia type - Plan: Hemoglobin A1c, PSA, Lipid panel, Hepatic Function Panel, Basic metabolic panel  Screening PSA (prostate specific antigen) - Plan: PSA  High risk medication use - Plan: Hemoglobin A1c, PSA, Lipid panel, Hepatic Function Panel, Basic metabolic panel  On recheck blood pressure good Tries to eat healthy Difficult for him to walk on a regular basis because of knee issues Patient does relate he is trying to eat healthy Denies any major setbacks Tries minimize salt Takes his blood pressure medicine regular basis but recently ran out Does take his cholesterol medicine tolerates it well but recently ran out Patient denies any hematochezia hematuria issues  Review of Systems     Objective:   Physical Exam  General-in no acute distress Eyes-no discharge Lungs-respiratory rate normal, CTA CV-no murmurs,RRR Extremities skin warm dry no edema Neuro grossly normal Behavior normal, alert       Assessment & Plan:  1. HTN (hypertension), benign Continue healthy eating.  Check lab work.  Await the results.  Blood pressure under good control. - Hemoglobin A1c - PSA - Lipid panel - Hepatic Function Panel - Basic metabolic panel  2. Hyperlipidemia, unspecified hyperlipidemia type Continue medication refill sent in healthy diet check lab work await results - Hemoglobin A1c - PSA - Lipid panel - Hepatic Function Panel - Basic metabolic panel  3. Screening PSA (prostate specific antigen) Screening PSA recommended - PSA  4. High risk medication use Labs recommended - Hemoglobin A1c - PSA - Lipid  panel - Hepatic Function Panel - Basic metabolic panel  Patient relates that he will follow-up in the fall for a wellness exam

## 2021-10-07 NOTE — Patient Instructions (Signed)
Hi Christopher [name]  It was good to see you today Continue to eat healthy I sent your medications into the pharmacy take them every day  Please do your blood work at [facility] somewhere in the next 30 days  We will see you for a wellness checkup later this fall  [facility]Care-Dr. Nicki Reaper

## 2021-10-08 NOTE — Telephone Encounter (Signed)
Patient had follow up on 10/07/21

## 2021-10-12 DIAGNOSIS — I10 Essential (primary) hypertension: Secondary | ICD-10-CM | POA: Diagnosis not present

## 2021-10-12 DIAGNOSIS — Z79899 Other long term (current) drug therapy: Secondary | ICD-10-CM | POA: Diagnosis not present

## 2021-10-12 DIAGNOSIS — Z125 Encounter for screening for malignant neoplasm of prostate: Secondary | ICD-10-CM | POA: Diagnosis not present

## 2021-10-12 DIAGNOSIS — E785 Hyperlipidemia, unspecified: Secondary | ICD-10-CM | POA: Diagnosis not present

## 2021-10-13 LAB — BASIC METABOLIC PANEL
BUN/Creatinine Ratio: 10 (ref 10–24)
BUN: 10 mg/dL (ref 8–27)
CO2: 23 mmol/L (ref 20–29)
Calcium: 9.5 mg/dL (ref 8.6–10.2)
Chloride: 103 mmol/L (ref 96–106)
Creatinine, Ser: 1.01 mg/dL (ref 0.76–1.27)
Glucose: 128 mg/dL — ABNORMAL HIGH (ref 70–99)
Potassium: 3.9 mmol/L (ref 3.5–5.2)
Sodium: 145 mmol/L — ABNORMAL HIGH (ref 134–144)
eGFR: 85 mL/min/{1.73_m2} (ref >59)

## 2021-10-13 LAB — LIPID PANEL
Chol/HDL Ratio: 4 ratio (ref 0.0–5.0)
Cholesterol, Total: 188 mg/dL (ref 100–199)
HDL: 47 mg/dL (ref >39)
LDL Chol Calc (NIH): 123 mg/dL — ABNORMAL HIGH (ref 0–99)
Triglycerides: 99 mg/dL (ref 0–149)
VLDL Cholesterol Cal: 18 mg/dL (ref 5–40)

## 2021-10-13 LAB — HEPATIC FUNCTION PANEL
ALT: 17 IU/L (ref 0–44)
AST: 12 IU/L (ref 0–40)
Albumin: 4.5 g/dL (ref 3.8–4.9)
Alkaline Phosphatase: 132 IU/L — ABNORMAL HIGH (ref 44–121)
Bilirubin Total: 0.7 mg/dL (ref 0.0–1.2)
Bilirubin, Direct: 0.17 mg/dL (ref 0.00–0.40)
Total Protein: 7.4 g/dL (ref 6.0–8.5)

## 2021-10-13 LAB — HEMOGLOBIN A1C
Est. average glucose Bld gHb Est-mCnc: 134 mg/dL
Hgb A1c MFr Bld: 6.3 % — ABNORMAL HIGH (ref 4.8–5.6)

## 2021-10-13 LAB — PSA: Prostate Specific Ag, Serum: 1.5 ng/mL (ref 0.0–4.0)

## 2021-10-15 ENCOUNTER — Ambulatory Visit (INDEPENDENT_AMBULATORY_CARE_PROVIDER_SITE_OTHER): Payer: Medicare HMO | Admitting: Family Medicine

## 2021-10-15 ENCOUNTER — Encounter: Payer: Self-pay | Admitting: Family Medicine

## 2021-10-15 VITALS — BP 138/88 | Wt 196.4 lb

## 2021-10-15 DIAGNOSIS — Z79899 Other long term (current) drug therapy: Secondary | ICD-10-CM

## 2021-10-15 DIAGNOSIS — I10 Essential (primary) hypertension: Secondary | ICD-10-CM | POA: Diagnosis not present

## 2021-10-15 DIAGNOSIS — E785 Hyperlipidemia, unspecified: Secondary | ICD-10-CM

## 2021-10-15 DIAGNOSIS — R7303 Prediabetes: Secondary | ICD-10-CM | POA: Insufficient documentation

## 2021-10-15 MED ORDER — ROSUVASTATIN CALCIUM 20 MG PO TABS: 20.0000 mg | ORAL_TABLET | Freq: Every day | ORAL | 1 refills | Status: DC

## 2021-10-15 NOTE — Patient Instructions (Signed)
Results for orders placed or performed in visit on 10/07/21  Hemoglobin A1c  Result Value Ref Range   Hgb A1c MFr Bld 6.3 (H) 4.8 - 5.6 %   Est. average glucose Bld gHb Est-mCnc 134 mg/dL  PSA  Result Value Ref Range   Prostate Specific Ag, Serum 1.5 0.0 - 4.0 ng/mL  Lipid panel  Result Value Ref Range   Cholesterol, Total 188 100 - 199 mg/dL   Triglycerides 99 0 - 149 mg/dL   HDL 47 >39 mg/dL   VLDL Cholesterol Cal 18 5 - 40 mg/dL   LDL Chol Calc (NIH) 123 (H) 0 - 99 mg/dL   Chol/HDL Ratio 4.0 0.0 - 5.0 ratio  Hepatic Function Panel  Result Value Ref Range   Total Protein 7.4 6.0 - 8.5 g/dL   Albumin 4.5 3.8 - 4.9 g/dL   Bilirubin Total 0.7 0.0 - 1.2 mg/dL   Bilirubin, Direct 0.17 0.00 - 0.40 mg/dL   Alkaline Phosphatase 132 (H) 44 - 121 IU/L   AST 12 0 - 40 IU/L   ALT 17 0 - 44 IU/L  Basic metabolic panel  Result Value Ref Range   Glucose 128 (H) 70 - 99 mg/dL   BUN 10 8 - 27 mg/dL   Creatinine, Ser 1.01 0.76 - 1.27 mg/dL   eGFR 85 >59 mL/min/1.73   BUN/Creatinine Ratio 10 10 - 24   Sodium 145 (H) 134 - 144 mmol/L   Potassium 3.9 3.5 - 5.2 mmol/L   Chloride 103 96 - 106 mmol/L   CO2 23 20 - 29 mmol/L   Calcium 9.5 8.6 - 10.2 mg/dL

## 2021-10-15 NOTE — Progress Notes (Signed)
   Subjective:    Patient ID: Christopher [name], male    DOB: Jan 09, 1961, 61 y.o.   MRN: [id number]  HPI Pt arrives to discuss recent labs. PCP advised pt to come in for detailed discussion regarding prediabetes and cholesterol    Review of Systems     Objective:   Physical Exam  Here today for long discussion regarding hyperlipidemia and prediabetes.  We discussed everything in detail we need to adjust his cholesterol medicine      Assessment & Plan:  Hyperlipidemia changed to Crestor 20 mg daily Check labs again in 3 months time Prediabetes healthy diet will send patient information check A1c 3 months Follow-up in 4 months

## 2021-10-16 NOTE — Addendum Note (Signed)
Addended by: Vicente Males on: 10/16/2021 09:24 AM   Modules accepted: Orders

## 2021-10-16 NOTE — Progress Notes (Signed)
10/16/21-Lab orders placed and mailed to patient with note to have done in 3-4 months with follow up office visit in 4 months.

## 2021-12-15 ENCOUNTER — Ambulatory Visit (INDEPENDENT_AMBULATORY_CARE_PROVIDER_SITE_OTHER): Payer: Medicare HMO

## 2021-12-15 VITALS — Ht 71.0 in | Wt 196.0 lb

## 2021-12-15 DIAGNOSIS — Z0000 Encounter for general adult medical examination without abnormal findings: Secondary | ICD-10-CM | POA: Diagnosis not present

## 2021-12-15 NOTE — Patient Instructions (Signed)
Christopher [name] , Thank you for taking time to come for your Medicare Wellness Visit. I appreciate your ongoing commitment to your health goals. Please review the following plan we discussed and let me know if I can assist you in the future.   Screening recommendations/referrals: Colonoscopy: 07/13/18 Recommended yearly ophthalmology/optometry visit for glaucoma screening and checkup Recommended yearly dental visit for hygiene and checkup  Vaccinations: Influenza vaccine: n/d Pneumococcal vaccine: n/d Tdap vaccine: 02/01/14 Shingles vaccine: n/d   Covid-19: 04/27/19, 05/30/19, 02/04/20  Advanced directives: no  Conditions/risks identified: none  Next appointment: Follow up in one year for your annual wellness visit 12/17/22 @ 8:15 am by phone  Preventive Care 40-64 Years, Male Preventive care refers to lifestyle choices and visits with your health care provider that can promote health and wellness. What does preventive care include? A yearly physical exam. This is also called an annual well check. Dental exams once or twice a year. Routine eye exams. Ask your health care provider how often you should have your eyes checked. Personal lifestyle choices, including: Daily care of your teeth and gums. Regular physical activity. Eating a healthy diet. Avoiding tobacco and drug use. Limiting alcohol use. Practicing safe sex. Taking low-dose aspirin every day starting at age 31. What happens during an annual well check? The services and screenings done by your health care provider during your annual well check will depend on your age, overall health, lifestyle risk factors, and family history of disease. Counseling  Your health care provider may ask you questions about your: Alcohol use. Tobacco use. Drug use. Emotional well-being. Home and relationship well-being. Sexual activity. Eating habits. Work and work Statistician. Screening  You may have the following tests or  measurements: Height, weight, and BMI. Blood pressure. Lipid and cholesterol levels. These may be checked every 5 years, or more frequently if you are over 47 years old. Skin check. Lung cancer screening. You may have this screening every year starting at age 71 if you have a 30-pack-year history of smoking and currently smoke or have quit within the past 15 years. Fecal occult blood test (FOBT) of the stool. You may have this test every year starting at age 91. Flexible sigmoidoscopy or colonoscopy. You may have a sigmoidoscopy every 5 years or a colonoscopy every 10 years starting at age 38. Prostate cancer screening. Recommendations will vary depending on your family history and other risks. Hepatitis C blood test. Hepatitis B blood test. Sexually transmitted disease (STD) testing. Diabetes screening. This is done by checking your blood sugar (glucose) after you have not eaten for a while (fasting). You may have this done every 1-3 years. Discuss your test results, treatment options, and if necessary, the need for more tests with your health care provider. Vaccines  Your health care provider may recommend certain vaccines, such as: Influenza vaccine. This is recommended every year. Tetanus, diphtheria, and acellular pertussis (Tdap, Td) vaccine. You may need a Td booster every 10 years. Zoster vaccine. You may need this after age 65. Pneumococcal 13-valent conjugate (PCV13) vaccine. You may need this if you have certain conditions and have not been vaccinated. Pneumococcal polysaccharide (PPSV23) vaccine. You may need one or two doses if you smoke cigarettes or if you have certain conditions. Talk to your health care provider about which screenings and vaccines you need and how often you need them. This information is not intended to replace advice given to you by your health care provider. Make sure you discuss any questions you  have with your health care provider. Document Released:  02/14/2015 Document Revised: 10/08/2015 Document Reviewed: 11/19/2014 Elsevier Interactive Patient Education  2017 [place] Prevention in the Home Falls can cause injuries. They can happen to people of all ages. There are many things you can do to make your home safe and to help prevent falls. What can I do on the outside of my home? Regularly fix the edges of walkways and driveways and fix any cracks. Remove anything that might make you trip as you walk through a door, such as a raised step or threshold. Trim any bushes or trees on the path to your home. Use bright outdoor lighting. Clear any walking paths of anything that might make someone trip, such as rocks or tools. Regularly check to see if handrails are loose or broken. Make sure that both sides of any steps have handrails. Any raised decks and porches should have guardrails on the edges. Have any leaves, snow, or ice cleared regularly. Use sand or salt on walking paths during winter. Clean up any spills in your garage right away. This includes oil or grease spills. What can I do in the bathroom? Use night lights. Install grab bars by the toilet and in the tub and shower. Do not use towel bars as grab bars. Use non-skid mats or decals in the tub or shower. If you need to sit down in the shower, use a plastic, non-slip stool. Keep the floor dry. Clean up any water that spills on the floor as soon as it happens. Remove soap buildup in the tub or shower regularly. Attach bath mats securely with double-sided non-slip rug tape. Do not have throw rugs and other things on the floor that can make you trip. What can I do in the bedroom? Use night lights. Make sure that you have a light by your bed that is easy to reach. Do not use any sheets or blankets that are too big for your bed. They should not hang down onto the floor. Have a firm chair that has side arms. You can use this for support while you get dressed. Do not have  throw rugs and other things on the floor that can make you trip. What can I do in the kitchen? Clean up any spills right away. Avoid walking on wet floors. Keep items that you use a lot in easy-to-reach places. If you need to reach something above you, use a strong step stool that has a grab bar. Keep electrical cords out of the way. Do not use floor polish or wax that makes floors slippery. If you must use wax, use non-skid floor wax. Do not have throw rugs and other things on the floor that can make you trip. What can I do with my stairs? Do not leave any items on the stairs. Make sure that there are handrails on both sides of the stairs and use them. Fix handrails that are broken or loose. Make sure that handrails are as long as the stairways. Check any carpeting to make sure that it is firmly attached to the stairs. Fix any carpet that is loose or worn. Avoid having throw rugs at the top or bottom of the stairs. If you do have throw rugs, attach them to the floor with carpet tape. Make sure that you have a light switch at the top of the stairs and the bottom of the stairs. If you do not have them, ask someone to add them for you.  What else can I do to help prevent falls? Wear shoes that: Do not have high heels. Have rubber bottoms. Are comfortable and fit you well. Are closed at the toe. Do not wear sandals. If you use a stepladder: Make sure that it is fully opened. Do not climb a closed stepladder. Make sure that both sides of the stepladder are locked into place. Ask someone to hold it for you, if possible. Clearly mark and make sure that you can see: Any grab bars or handrails. First and last steps. Where the edge of each step is. Use tools that help you move around (mobility aids) if they are needed. These include: Canes. Walkers. Scooters. Crutches. Turn on the lights when you go into a dark area. Replace any light bulbs as soon as they burn out. Set up your furniture so  you have a clear path. Avoid moving your furniture around. If any of your floors are uneven, fix them. If there are any pets around you, be aware of where they are. Review your medicines with your doctor. Some medicines can make you feel dizzy. This can increase your chance of falling. Ask your doctor what other things that you can do to help prevent falls. This information is not intended to replace advice given to you by your health care provider. Make sure you discuss any questions you have with your health care provider. Document Released: 11/14/2008 Document Revised: 06/26/2015 Document Reviewed: 02/22/2014 Elsevier Interactive Patient Education  2017 [employer].

## 2021-12-15 NOTE — Progress Notes (Signed)
Virtual Visit via Telephone Note  I connected with  Christopher [name] on 12/15/21 at  9:00 AM EST by telephone and verified that I am speaking with the correct person using two identifiers.  Location: Patient: home Provider: [facility] Persons participating in the virtual visit: patient/Nurse Health Advisor   I discussed the limitations, risks, security and privacy concerns of performing an evaluation and management service by telephone and the availability of in person appointments. The patient expressed understanding and agreed to proceed.  Interactive audio and video telecommunications were attempted between this nurse and patient, however failed, due to patient having technical difficulties OR patient did not have access to video capability.  We continued and completed visit with audio only.  Some vital signs may be absent or patient reported.   Dionisio David, LPN  Subjective:   Christopher [name] is a 61 y.o. male who presents for Medicare Annual/Subsequent preventive examination.  Review of Systems     Cardiac Risk Factors include: advanced age (>80mn, >>28women);hypertension;male gender     Objective:    There were no vitals filed for this visit. There is no height or weight on file to calculate BMI.     12/15/2021    9:01 AM 08/12/2021   12:08 PM 02/25/2021   11:32 AM 12/09/2020    8:45 AM 11/25/2018    1:08 PM 07/13/2018    6:39 AM 02/05/2018    2:21 PM  Advanced Directives  Does Patient Have a Medical Advance Directive? No No No No No No No  Would patient like information on creating a medical advance directive? No - Patient declined Yes (ED - Information included in AVS) No - Patient declined No - Patient declined  No - Patient declined No - Patient declined    Current Medications (verified) Outpatient Encounter Medications as of 12/15/2021  Medication Sig   acetaminophen (TYLENOL) 325 MG tablet Take 650 mg by mouth every 6 (six) hours as needed.   amLODipine (NORVASC)  10 MG tablet 1 qd   famotidine (PEPCID) 40 MG tablet Take 1 tablet (40 mg total) by mouth daily.   rosuvastatin (CRESTOR) 20 MG tablet Take 1 tablet (20 mg total) by mouth daily.   [DISCONTINUED] amLODipine (NORVASC) 10 MG tablet TAKE 1 TABLET(10 MG) BY MOUTH DAILY   [DISCONTINUED] atorvastatin (LIPITOR) 10 MG tablet TAKE 1 TABLET(10 MG) BY MOUTH DAILY   [DISCONTINUED] famotidine (PEPCID) 40 MG tablet Take 1 tablet (40 mg total) by mouth daily.   No facility-administered encounter medications on file as of 12/15/2021.    Allergies (verified) Hydrocodone   History: Past Medical History:  Diagnosis Date   Arthritis    Atrial fibrillation ([place]    a. occuring in the post-operative setting in 02/2017   GERD (gastroesophageal reflux disease)    HTN (hypertension)    Hyperlipemia    Past Surgical History:  Procedure Laterality Date   COLONOSCOPY N/A 07/13/2018   Procedure: COLONOSCOPY;  Surgeon: RRogene Houston MD;  [facility];  Service: Endoscopy;  Laterality: N/A;  [place] Right 04/05/2017   Procedure: EXAM UNDER ANESTHESIA WITH MANIPULATION OF KNEE;  Surgeon: HCarole Civil MD;  Location: [facility];  Service: Orthopedics;  Laterality: Right;   HERNIA REPAIR Right    KNEE ARTHROSCOPY  right knee   POLYPECTOMY  07/13/2018   Procedure: POLYPECTOMY;  Surgeon: RRogene Houston MD;  Location: [facility];  Service: Endoscopy;;  TOTAL KNEE ARTHROPLASTY Right 02/15/2017   Procedure: TOTAL KNEE ARTHROPLASTY;  Surgeon: Carole Civil, MD;  Location: [facility];  Service: Orthopedics;  Laterality: Right;   Family History  Problem Relation Age of Onset   Arthritis Other    Cancer Other    Diabetes Other    Other Mother 70       sepsis   CAD Father 41   CAD Brother 52   Diabetes Brother    Colon cancer Neg Hx    Social History   Socioeconomic History   Marital status: Married    Spouse name: Tammy   Number of  children: 2   Years of education: 12   Highest education level: Not on file  Occupational History   Occupation: truck Education administrator: [employer]  Tobacco Use   Smoking status: Never   Smokeless tobacco: Never  Vaping Use   Vaping Use: Never used  Substance and Sexual Activity   Alcohol use: Yes    Comment: occasional beer   Drug use: No   Sexual activity: Yes    Birth control/protection: None  Other Topics Concern   Not on file  Social History Narrative   1 son and 1 daughter   3 grandchildren   Social Determinants of Health   Financial Resource Strain: Low Risk  (12/15/2021)   Overall Financial Resource Strain (CARDIA)    Difficulty of Paying Living Expenses: Not hard at all  Food Insecurity: No Food Insecurity (12/15/2021)   Hunger Vital Sign    Worried About Running Out of Food in the Last Year: Never true    [place] in the Last Year: Never true  Transportation Needs: No Transportation Needs (12/15/2021)   PRAPARE - Hydrologist (Medical): No    Lack of Transportation (Non-Medical): No  Physical Activity: Insufficiently Active (12/15/2021)   Exercise Vital Sign    Days of Exercise per Week: 2 days    Minutes of Exercise per Session: 30 min  Stress: No Stress Concern Present (12/15/2021)   [place]    Feeling of Stress : Not at all  Social Connections: Moderately Integrated (12/15/2021)   Social Connection and Isolation Panel [NHANES]    Frequency of Communication with Friends and Family: More than three times a week    Frequency of Social Gatherings with Friends and Family: More than three times a week    Attends Religious Services: More than 4 times per year    Active Member of [employer] or Organizations: No    Attends Music therapist: Never    Marital Status: Married    Tobacco Counseling Counseling given: Not Answered   Clinical  Intake:  Pre-visit preparation completed: Yes  Pain : No/denies pain     Nutritional Risks: None Diabetes: No  How often do you need to have someone help you when you read instructions, pamphlets, or other written materials from your doctor or pharmacy?: 1 - Never  Diabetic?no  Interpreter Needed?: No  Information entered by :: Kirke Shaggy, LPN   Activities of Daily Living    12/15/2021    9:01 AM  In your present state of health, do you have any difficulty performing the following activities:  Hearing? 0  Vision? 0  Difficulty concentrating or making decisions? 0  Walking or climbing stairs? 0  Dressing or bathing? 0  Doing errands, shopping? 0  Preparing  Food and eating ? N  Using the Toilet? N  In the past six months, have you accidently leaked urine? N  Do you have problems with loss of bowel control? N  Managing your Medications? N  Managing your Finances? N  Housekeeping or managing your Housekeeping? N    Patient Care Team: Kathyrn Drown, MD as PCP - General (Family Medicine) Harl Bowie Alphonse Guild, MD as PCP - Cardiology (Cardiology) Kathyrn Drown, MD as Consulting Physician (Family Medicine)  Indicate any recent Medical Services you may have received from other than [facility] providers in the past year (date may be approximate).     Assessment:   This is a routine wellness examination for [place].  Hearing/Vision screen Hearing Screening - Comments:: No aids Vision Screening - Comments:: Wears glasses- [facility]  Dietary issues and exercise activities discussed: Current Exercise Habits: Home exercise routine, Type of exercise: walking, Time (Minutes): 30, Frequency (Times/Week): 2, Weekly Exercise (Minutes/Week): 60, Intensity: Mild   Goals Addressed             This Visit's Progress    DIET - EAT MORE FRUITS AND VEGETABLES         Depression Screen    12/15/2021    8:59 AM 02/25/2021   11:29 AM 12/09/2020    8:40 AM 09/15/2020    3:48  PM 03/25/2020    8:22 AM 12/13/2019   11:28 AM 08/24/2017    8:41 AM  PHQ 2/9 Scores  PHQ - 2 Score 0 0 0 0 0 0 2  PHQ- 9 Score 0      9    Fall Risk    12/15/2021    9:01 AM 02/25/2021   11:28 AM 12/09/2020    8:45 AM 12/13/2019   11:28 AM  Fall Risk   Falls in the past year? 0 0 0 0  Number falls in past yr: 0  0   Injury with Fall? 0  0   Risk for fall due to : No Fall Risks  Orthopedic patient   Follow up Falls prevention discussed;Falls evaluation completed  Falls prevention discussed Falls evaluation completed    FALL RISK PREVENTION PERTAINING TO THE HOME:  Any stairs in or around the home? Yes  If so, are there any without handrails? No  Home free of loose throw rugs in walkways, pet beds, electrical cords, etc? Yes  Adequate lighting in your home to reduce risk of falls? Yes   ASSISTIVE DEVICES UTILIZED TO PREVENT FALLS:  Life alert? No  Use of a cane, walker or w/c? Yes  Grab bars in the bathroom? Yes  Shower chair or bench in shower? Yes  Elevated toilet seat or a handicapped toilet? Yes     Cognitive Function:        12/15/2021    9:02 AM 12/09/2020    8:49 AM  6CIT Screen  What Year? 0 points 0 points  What month? 0 points 0 points  What time? 0 points 0 points  Count back from 20 0 points 0 points  Months in reverse 0 points 4 points  Repeat phrase 0 points 0 points  Total Score 0 points 4 points    Immunizations Immunization History  Administered Date(s) Administered   Moderna Sars-Covid-2 Vaccination 04/27/2019, 05/30/2019, 02/04/2020   Td 02/01/2014    TDAP status: Up to date  Flu Vaccine status: Due, Education has been provided regarding the importance of this vaccine. Advised may receive this vaccine  at local pharmacy or Health Dept. Aware to provide a copy of the vaccination record if obtained from local pharmacy or Health Dept. Verbalized acceptance and understanding.  Pneumococcal vaccine status: Due, Education has been provided  regarding the importance of this vaccine. Advised may receive this vaccine at local pharmacy or Health Dept. Aware to provide a copy of the vaccination record if obtained from local pharmacy or Health Dept. Verbalized acceptance and understanding.  Covid-19 vaccine status: Completed vaccines  Qualifies for Shingles Vaccine? Yes   Zostavax completed No   Shingrix Completed?: No.    Education has been provided regarding the importance of this vaccine. Patient has been advised to call insurance company to determine out of pocket expense if they have not yet received this vaccine. Advised may also receive vaccine at local pharmacy or Health Dept. Verbalized acceptance and understanding.  Screening Tests Health Maintenance  Topic Date Due   Zoster Vaccines- Shingrix (1 of 2) Never done   COVID-19 Vaccine (4 - Moderna risk series) 03/31/2020   Medicare Annual Wellness (AWV)  12/16/2022   COLONOSCOPY (Pts 45-6yr Insurance coverage will need to be confirmed)  07/13/2023   TETANUS/TDAP  02/02/2024   Hepatitis C Screening  Completed   HIV Screening  Completed   HPV VACCINES  Aged Out   INFLUENZA VACCINE  Discontinued    Health Maintenance  Health Maintenance Due  Topic Date Due   Zoster Vaccines- Shingrix (1 of 2) Never done   COVID-19 Vaccine (4 - Moderna risk series) 03/31/2020    Colorectal cancer screening: Type of screening: Colonoscopy. Completed 07/13/18. Repeat every 5 years  Lung Cancer Screening: (Low Dose CT Chest recommended if Age 60-80years, 30 pack-year currently smoking OR have quit w/in 15years.) does not qualify.   Additional Screening:  Hepatitis C Screening: does qualify; Completed 11/06/19  Vision Screening: Recommended annual ophthalmology exams for early detection of glaucoma and other disorders of the eye. Is the patient up to date with their annual eye exam?  Yes  Who is the provider or what is the name of the office in which the patient attends annual eye  exams? [facility] If pt is not established with a provider, would they like to be referred to a provider to establish care? No .   Dental Screening: Recommended annual dental exams for proper oral hygiene  Community Resource Referral / Chronic Care Management: CRR required this visit?  No   CCM required this visit?  No      Plan:     I have personally reviewed and noted the following in the patient's chart:   Medical and social history Use of alcohol, tobacco or illicit drugs  Current medications and supplements including opioid prescriptions. Patient is not currently taking opioid prescriptions. Functional ability and status Nutritional status Physical activity Advanced directives List of other physicians Hospitalizations, surgeries, and ER visits in previous 12 months Vitals Screenings to include cognitive, depression, and falls Referrals and appointments  In addition, I have reviewed and discussed with patient certain preventive protocols, quality metrics, and best practice recommendations. A written personalized care plan for preventive services as well as general preventive health recommendations were provided to patient.     LDionisio David LPN   [id number]  Nurse Notes: none

## 2022-04-01 ENCOUNTER — Encounter: Payer: Self-pay | Admitting: Radiology

## 2022-04-06 ENCOUNTER — Other Ambulatory Visit: Payer: Self-pay | Admitting: Family Medicine

## 2022-04-21 ENCOUNTER — Other Ambulatory Visit: Payer: Self-pay | Admitting: Family Medicine

## 2022-05-12 DIAGNOSIS — H5203 Hypermetropia, bilateral: Secondary | ICD-10-CM | POA: Diagnosis not present

## 2022-07-05 ENCOUNTER — Other Ambulatory Visit: Payer: Self-pay

## 2022-07-05 ENCOUNTER — Emergency Department (HOSPITAL_COMMUNITY)
Admission: EM | Admit: 2022-07-05 | Discharge: 2022-07-05 | Disposition: A | Discharge status: Home / Self Care | Payer: Medicare HMO | Attending: Emergency Medicine | Admitting: Emergency Medicine

## 2022-07-05 ENCOUNTER — Encounter (HOSPITAL_COMMUNITY): Payer: Self-pay | Admitting: Emergency Medicine

## 2022-07-05 ENCOUNTER — Emergency Department (HOSPITAL_COMMUNITY): Payer: Medicare HMO

## 2022-07-05 DIAGNOSIS — M542 Cervicalgia: Secondary | ICD-10-CM

## 2022-07-05 DIAGNOSIS — I480 Paroxysmal atrial fibrillation: Secondary | ICD-10-CM

## 2022-07-05 DIAGNOSIS — R0789 Other chest pain: Secondary | ICD-10-CM | POA: Diagnosis not present

## 2022-07-05 DIAGNOSIS — R079 Chest pain, unspecified: Secondary | ICD-10-CM | POA: Diagnosis not present

## 2022-07-05 DIAGNOSIS — R531 Weakness: Secondary | ICD-10-CM | POA: Diagnosis present

## 2022-07-05 LAB — CBC
HCT: 44.2 % (ref 39.0–52.0)
Hemoglobin: 15.6 g/dL (ref 13.0–17.0)
MCH: 30.2 pg (ref 26.0–34.0)
MCHC: 35.3 g/dL (ref 30.0–36.0)
MCV: 85.5 fL (ref 80.0–100.0)
Platelets: 237 10*3/uL (ref 150–400)
RBC: 5.17 MIL/uL (ref 4.22–5.81)
RDW: 12.4 % (ref 11.5–15.5)
WBC: 5.3 10*3/uL (ref 4.0–10.5)
nRBC: 0 % (ref 0.0–0.2)

## 2022-07-05 LAB — COMPREHENSIVE METABOLIC PANEL
ALT: 28 U/L (ref 0–44)
AST: 22 U/L (ref 15–41)
Albumin: 4.2 g/dL (ref 3.5–5.0)
Alkaline Phosphatase: 96 U/L (ref 38–126)
Anion gap: 10 (ref 5–15)
BUN: 17 mg/dL (ref 6–20)
CO2: 26 mmol/L (ref 22–32)
Calcium: 8.8 mg/dL — ABNORMAL LOW (ref 8.9–10.3)
Chloride: 104 mmol/L (ref 98–111)
Creatinine, Ser: 1.16 mg/dL (ref 0.61–1.24)
GFR, Estimated: >60 mL/min (ref >60)
Glucose, Bld: 104 mg/dL — ABNORMAL HIGH (ref 70–99)
Potassium: 3.3 mmol/L — ABNORMAL LOW (ref 3.5–5.1)
Sodium: 140 mmol/L (ref 135–145)
Total Bilirubin: 0.5 mg/dL (ref 0.3–1.2)
Total Protein: 7.7 g/dL (ref 6.5–8.1)

## 2022-07-05 LAB — TROPONIN I (HIGH SENSITIVITY)
Troponin I (High Sensitivity): 4 ng/L (ref <18)
Troponin I (High Sensitivity): 4 ng/L (ref <18)

## 2022-07-05 MED ORDER — METHOCARBAMOL 500 MG PO TABS: 500.0000 mg | ORAL_TABLET | Freq: Three times a day (TID) | ORAL | 0 refills | Status: DC | PRN

## 2022-07-05 MED ORDER — KETOROLAC TROMETHAMINE 15 MG/ML IJ SOLN
15.0000 mg | Freq: Once | INTRAMUSCULAR | Status: AC
Start: 2022-07-05 — End: 2022-07-05
  Administered 2022-07-05: 15 mg via INTRAMUSCULAR
  Filled 2022-07-05: qty 1

## 2022-07-05 NOTE — ED Triage Notes (Signed)
Pt reports sudden onset right-sided severe headache and pain down right side of neck, right shoulder, right arm since 1 hour PTA. Pt took aleve at home w/o relief. Denies CP, no n/v/diaphoresis, no dizziness. No known injury. Ambulatory and NAD in triage.

## 2022-07-05 NOTE — ED Provider Notes (Signed)
[place] EMERGENCY DEPARTMENT AT [facility] Provider Note   CSN: [id number] Arrival date & time: 07/05/22  2052     History  Chief Complaint  Patient presents with   Shoulder Pain   Headache    Christopher [name] is a 62 y.o. male.   Shoulder Pain Headache Patient presents with right shoulder pain.  Has had on and off for weeks now.  Will feel like a spasm in the right side of his neck.  Took Aleve at home without relief.  No nausea or vomiting.  Numbness weakness.  States there is pain to move the right shoulder.  Has chest pain at times.     Home Medications Prior to Admission medications   Medication Sig Start Date End Date Taking? Authorizing Provider  methocarbamol (ROBAXIN) 500 MG tablet Take 1 tablet (500 mg total) by mouth every 8 (eight) hours as needed for muscle spasms. 07/05/22  Yes Benjiman Core, MD  acetaminophen (TYLENOL) 325 MG tablet Take 650 mg by mouth every 6 (six) hours as needed.    [provider]  amLODipine (NORVASC) 10 MG tablet TAKE 1 TABLET BY MOUTH EVERY DAY 04/06/22   Babs Sciara, MD  famotidine (PEPCID) 40 MG tablet TAKE 1 TABLET(40 MG) BY MOUTH DAILY 04/06/22   Babs Sciara, MD  rosuvastatin (CRESTOR) 20 MG tablet TAKE 1 TABLET(20 MG) BY MOUTH DAILY 04/21/22   Babs Sciara, MD      Allergies    Hydrocodone    Review of Systems   Review of Systems  Neurological:  Positive for headaches.    Physical Exam Updated Vital Signs BP (!) 156/101   Pulse 80   Temp 98.8 F (37.1 C) (Oral)   Resp (!) 24   Ht 5\' 11"  (1.803 m)   Wt 88.9 kg   SpO2 98%   BMI 27.34 kg/m  Physical Exam Vitals and nursing note reviewed.  HENT:     Head: Normocephalic.  Neck:     Comments: Mild tenderness to muscles on right side of neck.  No swelling. Cardiovascular:     Rate and Rhythm: Normal rate. Rhythm irregular.  Chest:     Chest wall: No tenderness.  Musculoskeletal:     Cervical back: Neck supple.  Skin:    General: Skin  is warm.  Neurological:     Mental Status: [name] alert.   Strong pulse on right arm.  Some pain with movement of right shoulder.  Good range of motion in neck.  ED Results / Procedures / Treatments   Labs (all labs ordered are listed, but only abnormal results are displayed) Labs Reviewed  COMPREHENSIVE METABOLIC PANEL - Abnormal; Notable for the following components:      Result Value   Potassium 3.3 (*)    Glucose, Bld 104 (*)    Calcium 8.8 (*)    All other components within normal limits  CBC  TROPONIN I (HIGH SENSITIVITY)  TROPONIN I (HIGH SENSITIVITY)    EKG EKG Interpretation  Date/Time:  Monday July 05 2022 21:20:39 EDT Ventricular Rate:  93 PR Interval:  190 QRS Duration: 85 QT Interval:  381 QTC Calculation: 409 R Axis:   77 Text Interpretation: Sinus rhythm with flutter initially Ventricular bigeminy Consider left ventricular hypertrophy Confirmed by Benjiman Core [phone]) on 07/05/2022 9:58:45 PM  Radiology DG Chest Portable 1 View  Result Date: 07/05/2022 CLINICAL DATA:  Chest pain and right-sided neck pain, initial encounter EXAM: PORTABLE CHEST 1  VIEW COMPARISON:  11/25/2018 FINDINGS: The heart size and mediastinal contours are within normal limits. Both lungs are clear. The visualized skeletal structures are unremarkable. IMPRESSION: No active disease. Electronically Signed   By: Alcide Clever M.D.   On: 07/05/2022 22:38    Procedures Procedures    Medications Ordered in ED Medications  ketorolac (TORADOL) 15 MG/ML injection 15 mg (15 mg Intramuscular Given 07/05/22 2300)    ED Course/ Medical Decision Making/ A&P                             Medical Decision Making Amount and/or Complexity of Data Reviewed Labs: ordered. Radiology: ordered.  Risk Prescription drug management.   Patient with right neck pain.  I think most likely musculoskeletal.  Differential diagnosis includes musculoskeletal and other cause such as carotid dissection.  Worse  to palpation.  Worse with movement of the right shoulder.  However also found to be in atrial fibrillation.  Review of notes appears had history of same around 5 years ago after a hip surgery.  CHA2DS2-VASc score of 1.  Rate is controlled.  X-ray done reassuring.  Doubt ischemic cause.  Patient is overall rather low risk but potentially still would be a candidate for anticoagulation.  Will have follow-up with cardiology and discussed risk including stroke in the interval but overall we think it is low risk.  Doubt cause of the neck pain related to the A-fib.  Given shot of Toradol and muscle laxer for home.  Discharge.        Final Clinical Impression(s) / ED Diagnoses Final diagnoses:  Paroxysmal atrial fibrillation (HCC)  Neck pain    Rx / DC Orders ED Discharge Orders          Ordered    Ambulatory referral to Cardiology       Comments: If you have not heard from the Cardiology office within the next 72 hours please call [phone].   07/05/22 2313    methocarbamol (ROBAXIN) 500 MG tablet  Every 8 hours PRN        07/05/22 2313              Benjiman Core, MD 07/05/22 2351

## 2022-07-06 ENCOUNTER — Ambulatory Visit: Payer: Medicare HMO | Admitting: Family Medicine

## 2022-07-08 ENCOUNTER — Telehealth: Payer: Self-pay

## 2022-07-08 NOTE — Telephone Encounter (Signed)
Transition Care Management Follow-up Telephone Call Date of discharge and from where: 07/05/2022 [facility] How have you been since you were released from the hospital? Patient is feeling much better Any questions or concerns? No  Items Reviewed: Did the pt receive and understand the discharge instructions provided? Yes  Medications obtained and verified? Yes  Other? No  Any new allergies since your discharge? No  Dietary orders reviewed? Yes Do you have support at home? Yes   Follow up appointments reviewed:  PCP Hospital f/u appt confirmed? Yes  Scheduled to see Lilyan Punt, MD on 07/12/2022 @ [facility] Medicine. Specialist Hospital f/u appt confirmed? No  Scheduled to see  on  @ . Are transportation arrangements needed? No  If their condition worsens, is the pt aware to call PCP or go to the Emergency Dept.? Yes Was the patient provided with contact information for the PCP's office or ED? Yes Was to pt encouraged to call back with questions or concerns? Yes  Tanaysia Bhardwaj [facility]  [facility] Population Health Community Resource Care Guide   [email or web address]  ?? [phone]   Website: [email or web address]  [email or web address]

## 2022-07-12 ENCOUNTER — Ambulatory Visit (INDEPENDENT_AMBULATORY_CARE_PROVIDER_SITE_OTHER): Payer: Medicare HMO | Admitting: Family Medicine

## 2022-07-12 VITALS — BP 159/77 | HR 47 | Ht 71.0 in | Wt 192.2 lb

## 2022-07-12 DIAGNOSIS — I499 Cardiac arrhythmia, unspecified: Secondary | ICD-10-CM

## 2022-07-12 DIAGNOSIS — E785 Hyperlipidemia, unspecified: Secondary | ICD-10-CM

## 2022-07-12 DIAGNOSIS — I10 Essential (primary) hypertension: Secondary | ICD-10-CM | POA: Diagnosis not present

## 2022-07-12 NOTE — Progress Notes (Signed)
   Subjective:    Patient ID: Christopher [name], male    DOB: [birth year], 61 y.o.   MRN: [id number]  HPI  Patient arrives today for hospital follow up.  Was in the ER with shoulder pain still has shoulder pain discomfort hurts to make certain movements previously was seen by Dr. Romeo Apple MRI showed arthritis but the rotator cuff was good he does have limited range of motion  He also had palpitations and fast heart rate  Review of Systems     Objective:   Physical Exam  Lungs are clear hearts regular with an occasional irregular beat pulse otherwise normal decreased range of motion right shoulder      Assessment & Plan:  Gentle range of motion of the exercises of the shoulder recommended Patient defers on going back to Ortho Blood pressure elevated we will bring him back at another visit to recheck EKG did not show any acute changes We will communicate with cardiology to see if they feel the patient needs to have telemetry done before his visit with Dr. Wyline Mood  EKG shows normal sinus rhythm

## 2022-07-15 ENCOUNTER — Other Ambulatory Visit: Payer: Self-pay | Admitting: Family Medicine

## 2022-07-23 ENCOUNTER — Other Ambulatory Visit: Payer: Self-pay | Admitting: Family Medicine

## 2022-08-12 ENCOUNTER — Ambulatory Visit: Payer: Medicare HMO | Admitting: Student

## 2022-08-25 ENCOUNTER — Ambulatory Visit (INDEPENDENT_AMBULATORY_CARE_PROVIDER_SITE_OTHER): Payer: Medicare HMO | Admitting: Family Medicine

## 2022-08-25 VITALS — BP 138/70 | HR 69 | Wt 190.6 lb

## 2022-08-25 DIAGNOSIS — T63461A Toxic effect of venom of wasps, accidental (unintentional), initial encounter: Secondary | ICD-10-CM | POA: Diagnosis not present

## 2022-08-25 NOTE — Progress Notes (Signed)
   Subjective:    Patient ID: Christopher [name], male    DOB: [birth year], 62 y.o.   MRN: [id number]  HPI Patient with multiple bee stings occurred on head neck shoulders chest abdomen legs causes a lot of itching localized swelling no breathing difficulties never had this happen before overwhelmed him at the time but he states he is doing better now   Review of Systems     Objective:   Physical Exam  Lungs clear respiratory rate normal heart regular there is no airway compromise      Assessment & Plan:  Multiple yellowjacket stings recommend Benadryl as needed for itching ibuprofen for the discomfort Tylenol for the discomfort avoid bee stings warning signs regarding anaphylactic shock discussed no sign of anaphylactic shock currently follow-up if ongoing troubles or problems

## 2022-09-07 NOTE — Progress Notes (Signed)
This encounter was created in error - please disregard.

## 2022-09-08 ENCOUNTER — Ambulatory Visit: Payer: Medicare HMO | Attending: Student

## 2022-09-08 ENCOUNTER — Ambulatory Visit: Payer: Medicare HMO | Admitting: Student

## 2022-09-08 ENCOUNTER — Other Ambulatory Visit: Payer: Self-pay | Admitting: Student

## 2022-09-08 ENCOUNTER — Encounter: Payer: Self-pay | Admitting: Student

## 2022-09-08 VITALS — BP 126/86 | HR 88 | Ht 71.0 in | Wt 191.4 lb

## 2022-09-08 DIAGNOSIS — I480 Paroxysmal atrial fibrillation: Secondary | ICD-10-CM | POA: Diagnosis not present

## 2022-09-08 DIAGNOSIS — I10 Essential (primary) hypertension: Secondary | ICD-10-CM | POA: Diagnosis not present

## 2022-09-08 DIAGNOSIS — Z87898 Personal history of other specified conditions: Secondary | ICD-10-CM

## 2022-09-08 DIAGNOSIS — E785 Hyperlipidemia, unspecified: Secondary | ICD-10-CM

## 2022-09-08 NOTE — Patient Instructions (Addendum)
Medication Instructions:  Your physician recommends that you continue on your current medications as directed. Please refer to the Current Medication list given to you today.  Labwork: TSH today or tomorrow @[facility] Non-fasting  Testing/Procedures: Your physician has recommended that you wear a Zio monitor.   This monitor is a medical device that records the heart's electrical activity. Doctors most often use these monitors to diagnose arrhythmias. Arrhythmias are problems with the speed or rhythm of the heartbeat. The monitor is a small device applied to your chest. You can wear one while you do your normal daily activities. While wearing this monitor if you have any symptoms to push the button and record what you felt. Once you have worn this monitor for the period of time provider prescribed (for 14 days), you will return the monitor device in the postage paid box. Once it is returned they will download the data collected and provide [place] with a report which the provider will then review and we will call you with those results. Important tips:  Avoid showering during the first 24 hours of wearing the monitor. Avoid excessive sweating to help maximize wear time. Do not submerge the device, no hot tubs, and no swimming pools. Keep any lotions or oils away from the patch. After 24 hours you may shower with the patch on. Take brief showers with your back facing the shower head.  Do not remove patch once it has been placed because that will interrupt data and decrease adhesive wear time. Push the button when you have any symptoms and write down what you were feeling. Once you have completed wearing your monitor, remove and place into box which has postage paid and place in your outgoing mailbox.  If for some reason you have misplaced your box then call our office and we can provide another box and/or mail it off for you.  Follow-Up: Your physician recommends that you schedule a follow-up  appointment in: 1 year with Dr. Wyline Mood. You will receive a reminder call in about 10 months reminding you to schedule your appointment. If you don't receive this call, please contact our office.  Any Other Special Instructions Will Be Listed Below (If Applicable).  If you need a refill on your cardiac medications before your next appointment, please call your pharmacy.

## 2022-09-08 NOTE — Progress Notes (Signed)
Cardiology Office Note    Date:  09/08/2022  ID:  Christopher [name], DOB 1960-02-26, MRN [id number] Cardiologist: Dina Rich, MD    History of Present Illness:    Christopher [name] is a 62 y.o. 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  His most recent visit was a telehealth visit with Dr. Wyline Mood in 03/2019 and he denied any recent chest pain or palpitations at that time. He had self-discontinued Lopressor in the interim and this was not restarted at the time of his visit. Had not been on anticoagulation given his CHA2DS2-VASc score of 1 and due to his atrial fibrillation occurring in the postoperative setting.   Most recently, he presented to [facility] ED on 08/01/2022 for reported episodes of right shoulder pain intermittently over the past several weeks. Repeat EKG was performed and he was found to be in atrial fibrillation/flutter but rates were well-controlled. He was discharged home and did follow-up with his PCP and was referred back to Cardiology. He had a visit on 08/12/2022 but canceled this.  In talking with the patient today, he reports overall feeling well since his last office visit.  He denies any recent chest pain or palpitations. Reports he was asymptomatic with his arrhythmia during his Emergency Department evaluation. Denies any specific dyspnea on exertion, orthopnea, PND or pitting edema. He does mow over 14 yards per week but typically uses a riding mower. However, he goes to the [facility] and uses an exercise bike a few days a week and denies any anginal symptoms with this.  Studies Reviewed:   EKG: EKG is not ordered today, EKG from 07/05/2022 is reviewed and demonstrates rate-controlled atrial fibrillation, HR 87 with LVH.   Coronary CT: 06/2016 Calcium Score:  0   Coronary  Arteries: Right dominant with no anomalies   LM:  Normal   LAD:  Normal   D1: Normal   D2: Normal   Circumflex: Normal   OM1:  Normal   AV groove normal   RCA:  Normal   PDA: Normal   PLA:  Normal   IMPRESSION: Normal right dominant coronary arteries   Calcium Score of 0   Normal aortic root 2.8 cm   No proximal pulmonary emboli  Echocardiogram: 02/2017 Study Conclusions   - Left ventricle: The cavity size was normal. Wall thickness was    increased in a pattern of mild LVH. Systolic function was normal.    The estimated ejection fraction was in the range of 60% to 65%.    Wall motion was normal; there were no regional wall motion    abnormalities. Left ventricular diastolic function parameters    were normal.  - Pulmonic valve: There was mild regurgitation.   Risk Assessment/Calculations:    CHA2DS2-VASc Score = 1   This indicates a 0.6% annual risk of stroke. The patient's score is based upon: CHF History: 0 HTN History: 1 Diabetes History: 0 Stroke History: 0 Vascular Disease History: 0 Age Score: 0 Gender Score: 0    Physical Exam:   VS:  BP 126/86   Pulse 88   Ht 5\' 11"  (1.803 m)   Wt 191 lb 6.4 oz (86.8 kg)   SpO2 98%   BMI 26.69 kg/m    Wt Readings from Last 3 Encounters:  09/08/22 191 lb 6.4  oz (86.8 kg)  08/25/22 190 lb 9.6 oz (86.5 kg)  07/12/22 192 lb 3.2 oz (87.2 kg)     GEN: Well nourished, well developed male appearing in no acute distress NECK: No JVD; No carotid bruits CARDIAC: RRR, no murmurs, rubs, gallops RESPIRATORY:  Clear to auscultation without rales, wheezing or rhonchi  ABDOMEN: Appears non-distended. No obvious abdominal masses. EXTREMITIES: No clubbing or cyanosis. No pitting edema.  Distal pedal pulses are 2+ bilaterally.   Assessment and Plan:   1. Paroxysmal Atrial Fibrillation - He denies any recent palpitations but did have episodes of atrial fibrillation during his recent Emergency Department  evaluation. Reviewed options with the patient and will plan to place a 2-week Zio patch to assess his overall atrial fibrillation burden. He prefers to avoid additional medical therapy if able, therefore would only initiate AV nodal blocking agents if found to have episodes of tachycardia. - His CHA2DS2-VASc Score is only 1 (HTN) at this time, but we reviewed that once he turns 62 yo this would likely be indicated unless he develops other risk factors in the interim.  2. History of Chest Pain - He denies any recent anginal symptoms and prior Coronary CT in 06/2016 showed a coronary calcium score of 0. Continue with risk factor modification.  3. HTN - BP is well-controlled at 126/86 during today's visit. Continue current medical therapy Amlodipine 10 mg daily.  4. HLD - Followed by his PCP.  He remains on Crestor 20 mg daily.  Signed, Ellsworth Lennox, PA-C

## 2022-09-10 DIAGNOSIS — I480 Paroxysmal atrial fibrillation: Secondary | ICD-10-CM | POA: Diagnosis not present

## 2022-09-29 ENCOUNTER — Telehealth: Payer: Self-pay | Admitting: Cardiology

## 2022-09-29 DIAGNOSIS — I480 Paroxysmal atrial fibrillation: Secondary | ICD-10-CM | POA: Diagnosis not present

## 2022-09-29 NOTE — Telephone Encounter (Signed)
Zio By Meredeth Ide calling with abnormal results. Please advise

## 2022-09-29 NOTE — Telephone Encounter (Signed)
Spoke with [facility] [name] from [employer]. Pts monitor showed 28% A-fib burden with Hr up to 185 bpm for 60 sec. Seven runs of SVT, run of V-tach and 2 pauses longest 3.2 sec. Report uploaded.

## 2022-10-05 ENCOUNTER — Telehealth: Payer: Self-pay | Admitting: *Deleted

## 2022-10-05 DIAGNOSIS — I480 Paroxysmal atrial fibrillation: Secondary | ICD-10-CM

## 2022-10-05 NOTE — Telephone Encounter (Signed)
Pt notified and referral placed.  

## 2022-10-05 NOTE — Telephone Encounter (Signed)
-----   Message from [place] sent at 09/29/2022 12:58 PM EDT ----- Please let the patient know that his heart monitor showed frequent atrial fibrillation with an overall burden of 28% while wearing the monitor for 13 days. Average heart rate was 98 bpm while this occurred. He also had episodes of SVT (fast heart-rate) and frequent beats from the bottom part of the heart called PVC's with a 2.5% burden. Given that he did have pauses and episodes of a low heart rate intermittently (typically while sleeping), Dr. Wyline Mood does recommend that he see an electrophysiologist to discuss further management options of his arrhythmia. If the patient is in agreement, please enter a referral to see Dr. Ladona Ridgel or Dr. Nelly Laurence for atrial fibrillation and pauses.

## 2022-10-15 ENCOUNTER — Ambulatory Visit: Payer: Medicare HMO | Admitting: Cardiology

## 2022-10-26 ENCOUNTER — Other Ambulatory Visit: Payer: Self-pay | Admitting: Family Medicine

## 2022-11-16 ENCOUNTER — Institutional Professional Consult (permissible substitution): Payer: Medicare HMO | Admitting: Cardiovascular Disease

## 2022-12-17 ENCOUNTER — Ambulatory Visit (INDEPENDENT_AMBULATORY_CARE_PROVIDER_SITE_OTHER): Payer: Medicare HMO

## 2022-12-17 VITALS — Ht 71.0 in | Wt 191.0 lb

## 2022-12-17 DIAGNOSIS — Z0000 Encounter for general adult medical examination without abnormal findings: Secondary | ICD-10-CM | POA: Diagnosis not present

## 2022-12-17 NOTE — Patient Instructions (Signed)
Christopher [name] , Thank you for taking time to come for your Medicare Wellness Visit. I appreciate your ongoing commitment to your health goals. Please review the following plan we discussed and let me know if I can assist you in the future.   Referrals/Orders/Follow-Ups/Clinician Recommendations: Aim for 30 minutes of exercise or brisk walking, 6-8 glasses of water, and 5 servings of fruits and vegetables each day.  This is a list of the screening recommended for you and due dates:  Health Maintenance  Topic Date Due   Zoster (Shingles) Vaccine (1 of 2) Never done   COVID-19 Vaccine (4 - 2023-24 season) 10/03/2022   Colon Cancer Screening  07/13/2023   Medicare Annual Wellness Visit  12/17/2023   DTaP/Tdap/Td vaccine (2 - Tdap) 02/02/2024   Hepatitis C Screening  Completed   HIV Screening  Completed   HPV Vaccine  Aged Out   Flu Shot  Discontinued    Advanced directives: (ACP Link)Information on Advanced Care Planning can be found at [facility] [employer] Advance Health Care Directives Advance Health Care Directives ([email or web address]   Next Medicare Annual Wellness Visit scheduled for next year: Yes

## 2022-12-17 NOTE — Progress Notes (Signed)
Subjective:   Christopher [name] is a 62 y.o. male who presents for Medicare Annual/Subsequent preventive examination.  Visit Complete: Virtual I connected with  Christopher [name] on 12/17/22 by a audio enabled telemedicine application and verified that I am speaking with the correct person using two identifiers.  Patient Location: Home  Provider Location: Home Office  I discussed the limitations of evaluation and management by telemedicine. The patient expressed understanding and agreed to proceed.  Vital Signs: Because this visit was a virtual/telehealth visit, some criteria may be missing or patient reported. Any vitals not documented were not able to be obtained and vitals that have been documented are patient reported.  Cardiac Risk Factors include: advanced age (>31men, >41 women);hypertension;male gender     Objective:    Today's Vitals   12/17/22 0816  Weight: 191 lb (86.6 kg)  Height: 5\' 11"  (1.803 m)   Body mass index is 26.64 kg/m.     12/17/2022    9:16 AM 07/05/2022    9:12 PM 12/15/2021    9:01 AM 08/12/2021   12:08 PM 02/25/2021   11:32 AM 12/09/2020    8:45 AM 11/25/2018    1:08 PM  Advanced Directives  Does Patient Have a Medical Advance Directive? No No No No No No No  Would patient like information on creating a medical advance directive? Yes (MAU/Ambulatory/Procedural Areas - Information given)  No - Patient declined Yes (ED - Information included in AVS) No - Patient declined No - Patient declined     Current Medications (verified) Outpatient Encounter Medications as of 12/17/2022  Medication Sig   acetaminophen (TYLENOL) 325 MG tablet Take 650 mg by mouth every 6 (six) hours as needed.   amLODipine (NORVASC) 10 MG tablet TAKE 1 TABLET BY MOUTH EVERY DAY   famotidine (PEPCID) 40 MG tablet TAKE 1 TABLET(40 MG) BY MOUTH DAILY   rosuvastatin (CRESTOR) 20 MG tablet TAKE 1 TABLET(20 MG) BY MOUTH DAILY   methocarbamol (ROBAXIN) 500 MG tablet Take 1 tablet (500  mg total) by mouth every 8 (eight) hours as needed for muscle spasms. (Patient not taking: Reported on 12/17/2022)   No facility-administered encounter medications on file as of 12/17/2022.    Allergies (verified) Hydrocodone   History: Past Medical History:  Diagnosis Date   Arthritis    Atrial fibrillation (HCC)    a. occuring in the post-operative setting in 02/2017   GERD (gastroesophageal reflux disease)    HTN (hypertension)    Hyperlipemia    Past Surgical History:  Procedure Laterality Date   COLONOSCOPY N/A 07/13/2018   Procedure: COLONOSCOPY;  Surgeon: Malissa Hippo, MD;  [facility];  Service: Endoscopy;  Laterality: N/A;  830   EXAM UNDER ANESTHESIA WITH MANIPULATION OF KNEE Right 04/05/2017   Procedure: EXAM UNDER ANESTHESIA WITH MANIPULATION OF KNEE;  Surgeon: Vickki Hearing, MD;  Location: [facility];  Service: Orthopedics;  Laterality: Right;   HERNIA REPAIR Right    KNEE ARTHROSCOPY  right knee   POLYPECTOMY  07/13/2018   Procedure: POLYPECTOMY;  Surgeon: Malissa Hippo, MD;  Location: [facility];  Service: Endoscopy;;   TOTAL KNEE ARTHROPLASTY Right 02/15/2017   Procedure: TOTAL KNEE ARTHROPLASTY;  Surgeon: Vickki Hearing, MD;  Location: [facility];  Service: Orthopedics;  Laterality: Right;   Family History  Problem Relation Age of Onset   Arthritis Other    Cancer Other    Diabetes Other    Other Mother 20  sepsis   CAD Father 68   CAD Brother 76   Diabetes Brother    Colon cancer Neg Hx    Social History   Socioeconomic History   Marital status: Married    Spouse name: Christopher [name]   Number of children: 2   Years of education: 12   Highest education level: Not on file  Occupational History   Occupation: truck Air traffic controller: [employer]  Tobacco Use   Smoking status: Never   Smokeless tobacco: Never  Vaping Use   Vaping status: Never Used  Substance and Sexual Activity   Alcohol use: Not Currently    Comment:  occasional beer   Drug use: No   Sexual activity: Yes    Birth control/protection: None  Other Topics Concern   Not on file  Social History Narrative   1 son and 1 daughter   3 grandchildren   Social Determinants of Health   Financial Resource Strain: Low Risk  (12/17/2022)   Overall Financial Resource Strain (CARDIA)    Difficulty of Paying Living Expenses: Not hard at all  Food Insecurity: No Food Insecurity (12/17/2022)   Hunger Vital Sign    Worried About Running Out of Food in the Last Year: Never true    Ran Out of Food in the Last Year: Never true  Transportation Needs: No Transportation Needs (12/17/2022)   PRAPARE - Administrator, Civil Service (Medical): No    Lack of Transportation (Non-Medical): No  Physical Activity: Insufficiently Active (12/17/2022)   Exercise Vital Sign    Days of Exercise per Week: 3 days    Minutes of Exercise per Session: 30 min  Stress: No Stress Concern Present (12/17/2022)   [employer] of Occupational Health - Occupational Stress Questionnaire    Feeling of Stress : Not at all  Social Connections: Moderately Integrated (12/17/2022)   Social Connection and Isolation Panel [NHANES]    Frequency of Communication with Friends and Family: More than three times a week    Frequency of Social Gatherings with Friends and Family: Three times a week    Attends Religious Services: More than 4 times per year    Active Member of Clubs or Organizations: No    Attends Banker Meetings: Never    Marital Status: Married    Tobacco Counseling Counseling given: Not Answered   Clinical Intake:  Pre-visit preparation completed: Yes  Pain : No/denies pain     Diabetes: No  How often do you need to have someone help you when you read instructions, pamphlets, or other written materials from your doctor or pharmacy?: 1 - Never  Interpreter Needed?: No  Information entered by :: Kandis Fantasia LPN   Activities of  Daily Living    12/17/2022    9:16 AM  In your present state of health, do you have any difficulty performing the following activities:  Hearing? 0  Vision? 0  Difficulty concentrating or making decisions? 0  Walking or climbing stairs? 0  Dressing or bathing? 0  Doing errands, shopping? 0  Preparing Food and eating ? N  Using the Toilet? N  In the past six months, have you accidently leaked urine? N  Do you have problems with loss of bowel control? N  Managing your Medications? N  Managing your Finances? N  Housekeeping or managing your Housekeeping? N    Patient Care Team: Babs Sciara, MD as PCP - General (Family Medicine) Antoine Poche,  MD as PCP - Cardiology (Cardiology) Babs Sciara, MD as Consulting Physician (Family Medicine)  Indicate any recent Medical Services you may have received from other than [facility] providers in the past year (date may be approximate).     Assessment:   This is a routine wellness examination for [place].  Hearing/Vision screen Hearing Screening - Comments:: Denies hearing difficulties   Vision Screening - Comments:: Wears rx glasses - up to date with routine eye exams with Dr. Charise Killian    Goals Addressed   None   Depression Screen    12/17/2022    9:17 AM 08/25/2022    4:14 PM 07/12/2022    3:11 PM 12/15/2021    8:59 AM 02/25/2021   11:29 AM 12/09/2020    8:40 AM 09/15/2020    3:48 PM  PHQ 2/9 Scores  PHQ - 2 Score 0 0 0 0 0 0 0  PHQ- 9 Score 0 0 1 0       Fall Risk    12/17/2022    9:16 AM 08/25/2022    4:14 PM 07/12/2022    3:11 PM 12/15/2021    9:01 AM 02/25/2021   11:28 AM  Fall Risk   Falls in the past year? 0 0 0 0 0  Number falls in past yr: 0 0 0 0   Injury with Fall? 0 0 0 0   Risk for fall due to : No Fall Risks   No Fall Risks   Follow up Falls prevention discussed;Education provided;Falls evaluation completed   Falls prevention discussed;Falls evaluation completed     MEDICARE RISK AT HOME: Medicare Risk  at Home Any stairs in or around the home?: No If so, are there any without handrails?: No Home free of loose throw rugs in walkways, pet beds, electrical cords, etc?: Yes Adequate lighting in your home to reduce risk of falls?: Yes Life alert?: No Use of a cane, walker or w/c?: No Grab bars in the bathroom?: Yes Shower chair or bench in shower?: No Elevated toilet seat or a handicapped toilet?: Yes  TIMED UP AND GO:  Was the test performed?  No    Cognitive Function:        12/17/2022    9:17 AM 12/15/2021    9:02 AM 12/09/2020    8:49 AM  6CIT Screen  What Year? 0 points 0 points 0 points  What month? 0 points 0 points 0 points  What time? 0 points 0 points 0 points  Count back from 20 0 points 0 points 0 points  Months in reverse 0 points 0 points 4 points  Repeat phrase 0 points 0 points 0 points  Total Score 0 points 0 points 4 points    Immunizations Immunization History  Administered Date(s) Administered   Moderna Sars-Covid-2 Vaccination 04/27/2019, 05/30/2019, 02/04/2020   Td 02/01/2014    TDAP status: Up to date  Flu Vaccine status: Declined, Education has been provided regarding the importance of this vaccine but patient still declined. Advised may receive this vaccine at local pharmacy or Health Dept. Aware to provide a copy of the vaccination record if obtained from local pharmacy or Health Dept. Verbalized acceptance and understanding.  Pneumococcal vaccine status: Declined,  Education has been provided regarding the importance of this vaccine but patient still declined. Advised may receive this vaccine at local pharmacy or Health Dept. Aware to provide a copy of the vaccination record if obtained from local pharmacy or Health Dept. Verbalized acceptance and understanding.  Covid-19 vaccine status: Information provided on how to obtain vaccines.   Qualifies for Shingles Vaccine? Yes   Zostavax completed No   Shingrix Completed?: No.    Education has been  provided regarding the importance of this vaccine. Patient has been advised to call insurance company to determine out of pocket expense if they have not yet received this vaccine. Advised may also receive vaccine at local pharmacy or Health Dept. Verbalized acceptance and understanding.  Screening Tests Health Maintenance  Topic Date Due   Zoster Vaccines- Shingrix (1 of 2) Never done   COVID-19 Vaccine (4 - 2023-24 season) 10/03/2022   Colonoscopy  07/13/2023   Medicare Annual Wellness (AWV)  12/17/2023   DTaP/Tdap/Td (2 - Tdap) 02/02/2024   Hepatitis C Screening  Completed   HIV Screening  Completed   HPV VACCINES  Aged Out   INFLUENZA VACCINE  Discontinued    Health Maintenance  Health Maintenance Due  Topic Date Due   Zoster Vaccines- Shingrix (1 of 2) Never done   COVID-19 Vaccine (4 - 2023-24 season) 10/03/2022    Colorectal cancer screening: Type of screening: Colonoscopy. Completed 07/13/18. Repeat every 5 years  Lung Cancer Screening: (Low Dose CT Chest recommended if Age 88-80 years, 20 pack-year currently smoking OR have quit w/in 15years.) does not qualify.   Lung Cancer Screening Referral: n/a  Additional Screening:  Hepatitis C Screening: does qualify; Completed 11/06/19  Vision Screening: Recommended annual ophthalmology exams for early detection of glaucoma and other disorders of the eye. Is the patient up to date with their annual eye exam?  Yes  Who is the provider or what is the name of the office in which the patient attends annual eye exams? Dr. Charise Killian If pt is not established with a provider, would they like to be referred to a provider to establish care? No .   Dental Screening: Recommended annual dental exams for proper oral hygiene  Community Resource Referral / Chronic Care Management: CRR required this visit?  No   CCM required this visit?  No     Plan:     I have personally reviewed and noted the following in the patient's chart:    Medical and social history Use of alcohol, tobacco or illicit drugs  Current medications and supplements including opioid prescriptions. Patient is not currently taking opioid prescriptions. Functional ability and status Nutritional status Physical activity Advanced directives List of other physicians Hospitalizations, surgeries, and ER visits in previous 12 months Vitals Screenings to include cognitive, depression, and falls Referrals and appointments  In addition, I have reviewed and discussed with patient certain preventive protocols, quality metrics, and best practice recommendations. A written personalized care plan for preventive services as well as general preventive health recommendations were provided to patient.     Kandis Fantasia [place], [place]   [id number]   After Visit Summary: (MyChart) Due to this being a telephonic visit, the after visit summary with patients personalized plan was offered to patient via MyChart   Nurse Notes: See telephone note with patient concern

## 2022-12-21 ENCOUNTER — Ambulatory Visit: Payer: Medicare HMO | Admitting: Family Medicine

## 2022-12-21 VITALS — BP 132/83 | Ht 71.0 in | Wt 195.2 lb

## 2022-12-21 DIAGNOSIS — M25511 Pain in right shoulder: Secondary | ICD-10-CM

## 2022-12-21 DIAGNOSIS — G8929 Other chronic pain: Secondary | ICD-10-CM | POA: Diagnosis not present

## 2022-12-21 MED ORDER — MELOXICAM 15 MG PO TABS: 15.0000 mg | ORAL_TABLET | Freq: Every day | ORAL | 0 refills | Status: DC | PRN

## 2022-12-21 NOTE — Progress Notes (Signed)
Subjective:  Patient ID: Christopher [name], male    DOB: August 31, 1960  Age: 62 y.o. MRN: [id number]  CC:   Chief Complaint  Patient presents with   right shoulder pain    HPI:  62 year old male presents for evaluation of the above.  Patient reports pain of his right arm.  He states that it is mostly from the shoulder down.  He has known severe osteoarthritis of the shoulder.  Has been seen by orthopedics previously.  MRI imaging has been reviewed.  He previously got an injection.  Patient reports that his pain has been worsening over the past 3 to 4 weeks.  He has been taking Advil without relief.  He has decreased range of motion of the right shoulder.  No other complaints at this time.  Patient Active Problem List   Diagnosis Date Noted   Right shoulder pain 12/21/2022   Prediabetes 10/15/2021   Arthrofibrosis of knee joint, right s/p Manipulation 04/05/17 04/05/2017   S/P total knee replacement, right 02/15/17 03/01/2017   Primary osteoarthritis of both knees 10/13/2016   Hyperlipemia 04/16/2013   HTN (hypertension), benign 04/16/2013   Impingement syndrome of right shoulder 03/08/2011    Social Hx   Social History   Socioeconomic History   Marital status: Married    Spouse name: Tammy   Number of children: 2   Years of education: 12   Highest education level: Not on file  Occupational History   Occupation: truck Air traffic controller: [employer]  Tobacco Use   Smoking status: Never   Smokeless tobacco: Never  Vaping Use   Vaping status: Never Used  Substance and Sexual Activity   Alcohol use: Not Currently    Comment: occasional beer   Drug use: No   Sexual activity: Yes    Birth control/protection: None  Other Topics Concern   Not on file  Social History Narrative   1 son and 1 daughter   3 grandchildren   Social Determinants of Health   Financial Resource Strain: Low Risk  (12/17/2022)   Overall Financial Resource Strain (CARDIA)    Difficulty of Paying  Living Expenses: Not hard at all  Food Insecurity: No Food Insecurity (12/17/2022)   Hunger Vital Sign    Worried About Running Out of Food in the Last Year: Never true    Ran Out of Food in the Last Year: Never true  Transportation Needs: No Transportation Needs (12/17/2022)   PRAPARE - Administrator, Civil Service (Medical): No    Lack of Transportation (Non-Medical): No  Physical Activity: Insufficiently Active (12/17/2022)   Exercise Vital Sign    Days of Exercise per Week: 3 days    Minutes of Exercise per Session: 30 min  Stress: No Stress Concern Present (12/17/2022)   [employer] of Occupational Health - Occupational Stress Questionnaire    Feeling of Stress : Not at all  Social Connections: Moderately Integrated (12/17/2022)   Social Connection and Isolation Panel [NHANES]    Frequency of Communication with Friends and Family: More than three times a week    Frequency of Social Gatherings with Friends and Family: Three times a week    Attends Religious Services: More than 4 times per year    Active Member of Clubs or Organizations: No    Attends Banker Meetings: Never    Marital Status: Married    Review of Systems Per HPI  Objective:  BP 132/83   Ht  5\' 11"  (1.803 m)   Wt 195 lb 3.2 oz (88.5 kg)   BMI 27.22 kg/m      12/21/2022    2:26 PM 12/17/2022    8:16 AM 09/08/2022    2:41 PM  BP/Weight  Systolic BP 132 -- 126  Diastolic BP 83 -- 86  Wt. (Lbs) 195.2 191 191.4  BMI 27.22 kg/m2 26.64 kg/m2 26.69 kg/m2    Physical Exam Constitutional:      General: [name] not in acute distress.    Appearance: Normal appearance.  HENT:     Head: Normocephalic and atraumatic.  Pulmonary:     Effort: Pulmonary effort is normal. No respiratory distress.  Musculoskeletal:     Comments: Right shoulder: Decreased range of motion particular with flexion. Normal rotator cuff strength. Positive empty can.  Positive Hawkins.  Neurological:      Mental Status: [name] alert.  Psychiatric:        Mood and Affect: Mood normal.        Behavior: Behavior normal.     Lab Results  Component Value Date   WBC 5.3 07/05/2022   HGB 15.6 07/05/2022   HCT 44.2 07/05/2022   PLT 237 07/05/2022   GLUCOSE 104 (H) 07/05/2022   CHOL 188 10/12/2021   TRIG 99 10/12/2021   HDL 47 10/12/2021   LDLCALC 123 (H) 10/12/2021   ALT 28 07/05/2022   AST 22 07/05/2022   NA 140 07/05/2022   K 3.3 (L) 07/05/2022   CL 104 07/05/2022   CREATININE 1.16 07/05/2022   BUN 17 07/05/2022   CO2 26 07/05/2022   TSH 1.140 09/10/2022   PSA 1.41 06/12/2015   INR 1.03 02/09/2017   HGBA1C 6.3 (H) 10/12/2021     Assessment & Plan:   Problem List Items Addressed This Visit       Other   Right shoulder pain - Primary    Chronic problem with recent worsening/exacerbation.  Has known severe osteoarthritis of the right shoulder.  Recommended patient see orthopedics again.  Patient wants to wait at this time.  Meloxicam as directed.       Meds ordered this encounter  Medications   meloxicam (MOBIC) 15 MG tablet    Sig: Take 1 tablet (15 mg total) by mouth daily as needed (Right shoulder pain/arm pain).    Dispense:  30 tablet    Refill:  0   Bryann Gentz DO [facility]

## 2022-12-21 NOTE — Patient Instructions (Signed)
Medication as directed.  Consider returning to see Orthopedics given severity of arthritis.  Dr. Adriana Simas

## 2022-12-21 NOTE — Assessment & Plan Note (Addendum)
Chronic problem with recent worsening/exacerbation.  Has known severe osteoarthritis of the right shoulder.  Recommended patient see orthopedics again.  Patient wants to wait at this time.  Meloxicam as directed.

## 2022-12-27 ENCOUNTER — Ambulatory Visit: Payer: Medicare HMO | Attending: Cardiovascular Disease | Admitting: Cardiovascular Disease

## 2022-12-27 ENCOUNTER — Encounter: Payer: Self-pay | Admitting: Cardiovascular Disease

## 2022-12-27 VITALS — BP 118/84 | HR 79 | Ht 71.0 in | Wt 199.2 lb

## 2022-12-27 DIAGNOSIS — D6869 Other thrombophilia: Secondary | ICD-10-CM | POA: Diagnosis not present

## 2022-12-27 DIAGNOSIS — I480 Paroxysmal atrial fibrillation: Secondary | ICD-10-CM

## 2022-12-27 NOTE — Progress Notes (Signed)
Electrophysiology Office Note:    Date:  12/27/2022   ID:  AMMAAR PEASLEE, DOB [birth year], MRN [id number]  PCP:  Babs Sciara, MD   [facility] Providers Cardiologist:  Dina Rich, MD     Referring MD: Ellsworth Lennox, [place]*   History of Present Illness:    Christopher [name] is a 62 y.o. male with a medical history significant for atrial fibrillation, hypertension, hyperlipidemia, referred for arrhythmia management.     I discussed the use of AI scribe software for clinical note transcription with the patient, who gave verbal consent to proceed.  He was diagnosed with atrial fibrillation (AFib) after a knee surgery in 2019. The patient was managed with a beta-blocker but discontinued it in 2021. The patient was not on anticoagulation due to a low CHA2DS2-VASc score. The patient was admitted to the hospital in June 2024 for episodes of shoulder pain, during which EKG showed AFib and flutter with controlled rates. The patient missed a follow-up visit with cardiology in July. The patient was recently found to have a 24% burden of AFib on a 14-day Zio monitor. The patient reports feeling palpitations and "like a wet dog" at times.  The patient does not recall hitting the button on the Zio monitor to indicate symptoms.     Today, he reports that he feels well and is at baseline.  EKGs/Labs/Other Studies Reviewed Today:     Echocardiogram:  TTE January 2019 EF 60 to 65% normal valve structure   Monitors:  14-day ZIO --  my interpretation Sinus rhythm 40-171 beats minute, average 73 28% burden of atrial fibrillation , 40 to 217 bpm, average 94 bpm 2 pauses of about 3 seconds occurred in the a.m. and were not associated with symptoms Patient triggered episodes were associated with atrial fibrillation     EKG:   EKG Interpretation Date/Time:  Monday December 27 2022 11:09:08 EST Ventricular Rate:  79 PR Interval:  180 QRS Duration:  82 QT  Interval:  358 QTC Calculation: 410 R Axis:   97  Text Interpretation: Sinus rhythm with frequent PACS with aberrent conduction Rightward axis When compared with ECG of 05-Jul-2022 22:11, Sinus rhythm with PACs has replaced atrial fibrillation Confirmed by York Pellant [phone]) on 12/27/2022 11:26:38 AM     Physical Exam:    VS:  BP 118/84 (BP Location: Left Arm, Patient Position: Sitting, Cuff Size: Large)   Pulse 79   Ht 5\' 11"  (1.803 m)   Wt 199 lb 3.2 oz (90.4 kg)   SpO2 96%   BMI 27.78 kg/m     Wt Readings from Last 3 Encounters:  12/27/22 199 lb 3.2 oz (90.4 kg)  12/21/22 195 lb 3.2 oz (88.5 kg)  12/17/22 191 lb (86.6 kg)     GEN: Well nourished, well developed in no acute distress CARDIAC: RRR, no murmurs, rubs, gallops RESPIRATORY:  Normal work of breathing MUSCULOSKELETAL: no edema    ASSESSMENT & PLAN:     Paroxysmal atrial fibrillation Occasional symptoms of lightheadedness, dizziness possibly due to extreme RVR Rates are occasionally very rapid We discussed management options.  He would like to proceed with ablation Start metoprolol 25mg  PO BID  We discussed the indication, rationale, logistics, anticipated benefits, and potential risks of the ablation procedure including but not limited to -- bleed at the groin access site, chest pain, damage to nearby organs such as the diaphragm, lungs, or esophagus, need for a drainage tube, or prolonged hospitalization. I explained that  the risk for stroke, heart attack, need for open chest surgery, or even death is very low but not zero. he  expressed understanding and wishes to proceed.   Secondary hypercoagulable state CHA2DS2-VASc score is 1 for hypertension Will need to take anticoagulation during the periprocedural period      Signed, Maurice Small, MD  12/27/2022 11:43 AM    [facility]

## 2022-12-27 NOTE — Patient Instructions (Signed)
Medication Instructions:  Your physician recommends that you continue on your current medications as directed. Please refer to the Current Medication list given to you today. *If you need a refill on your cardiac medications before your next appointment, please call your pharmacy*   Testing/Procedures: Atrial Fibrillation Ablation - we will contact you to set this up once Dr Morrie Sheldon April 2025 schedule opens up  Your physician has recommended that you have an ablation. Catheter ablation is a medical procedure used to treat some cardiac arrhythmias (irregular heartbeats). During catheter ablation, a long, thin, flexible tube is put into a blood vessel in your groin (upper thigh), or neck. This tube is called an ablation catheter. It is then guided to your heart through the blood vessel. Radio frequency waves destroy small areas of heart tissue where abnormal heartbeats may cause an arrhythmia to start. Please see the instruction sheet given to you today.   Follow-Up: At [facility], you and your health needs are our priority.  As part of our continuing mission to provide you with exceptional heart care, we have created designated Provider Care Teams.  These Care Teams include your primary Cardiologist (physician) and Advanced Practice Providers (APPs -  Physician Assistants and Nurse Practitioners) who all work together to provide you with the care you need, when you need it.  We recommend signing up for the patient portal called "MyChart".  Sign up information is provided on this After Visit Summary.  MyChart is used to connect with patients for Virtual Visits (Telemedicine).  Patients are able to view lab/test results, encounter notes, upcoming appointments, etc.  Non-urgent messages can be sent to your provider as well.   To learn more about what you can do with MyChart, go to [email or web address].    Your next appointment:   We will set up a follow up appointment prior to ablation  once scheduled  Provider:   York Pellant, MD

## 2023-01-15 ENCOUNTER — Other Ambulatory Visit: Payer: Self-pay | Admitting: Family Medicine

## 2023-01-19 ENCOUNTER — Telehealth: Payer: Self-pay

## 2023-01-19 NOTE — Telephone Encounter (Signed)
Attempted to contact patient to schedule ablation - unable to leave message

## 2023-01-22 ENCOUNTER — Other Ambulatory Visit: Payer: Self-pay | Admitting: Family Medicine

## 2023-01-24 ENCOUNTER — Telehealth: Payer: Self-pay

## 2023-01-24 DIAGNOSIS — I480 Paroxysmal atrial fibrillation: Secondary | ICD-10-CM

## 2023-01-24 NOTE — Telephone Encounter (Signed)
Spoke with patient, ablation scheduled on 03/22/23 at 0730 with Dr Nelly Laurence - labs to be completed on 02/21/23. Patient aware that someone will contact him to set up CT scan - no needs at this time

## 2023-01-25 ENCOUNTER — Other Ambulatory Visit: Payer: Self-pay | Admitting: Family Medicine

## 2023-02-03 DIAGNOSIS — I480 Paroxysmal atrial fibrillation: Secondary | ICD-10-CM | POA: Diagnosis not present

## 2023-02-03 LAB — CBC
Hematocrit: 47.1 % (ref 37.5–51.0)
Hemoglobin: 16.3 g/dL (ref 13.0–17.7)
MCH: 30.8 pg (ref 26.6–33.0)
MCHC: 34.6 g/dL (ref 31.5–35.7)
MCV: 89 fL (ref 79–97)
Platelets: 238 10*3/uL (ref 150–450)
RBC: 5.3 x10E6/uL (ref 4.14–5.80)
RDW: 13.1 % (ref 11.6–15.4)
WBC: 4.8 10*3/uL (ref 3.4–10.8)

## 2023-02-04 LAB — BASIC METABOLIC PANEL
BUN/Creatinine Ratio: 13 (ref 10–24)
BUN: 16 mg/dL (ref 8–27)
CO2: 26 mmol/L (ref 20–29)
Calcium: 9.5 mg/dL (ref 8.6–10.2)
Chloride: 101 mmol/L (ref 96–106)
Creatinine, Ser: 1.22 mg/dL (ref 0.76–1.27)
Glucose: 149 mg/dL — ABNORMAL HIGH (ref 70–99)
Potassium: 3.5 mmol/L (ref 3.5–5.2)
Sodium: 144 mmol/L (ref 134–144)
eGFR: 67 mL/min/{1.73_m2} (ref >59)

## 2023-02-09 NOTE — Telephone Encounter (Signed)
-----   Message from Eulas FORBES Furbish sent at 02/04/2023  3:08 PM EST ----- You sent this to me while  I was out on vacay. Yes. Eliquis  5 BID for 4 weeks prior to the ablation. ----- Message ----- From: Interface, [facility] Lab Results In Sent: 02/03/2023   8:35 PM EST To: Eulas FORBES Furbish, MD

## 2023-02-09 NOTE — Telephone Encounter (Signed)
 Attempted to contact patient about starting Eliquis 4 weeks prior to ablation - needs to start 02/21/23 - left message for patient to contact our office back.

## 2023-02-11 ENCOUNTER — Telehealth: Payer: Self-pay

## 2023-02-11 MED ORDER — APIXABAN 5 MG PO TABS: ORAL_TABLET | ORAL | 6 refills | Status: DC

## 2023-02-11 NOTE — Addendum Note (Signed)
 Addended by: Sherle Poe R on: 02/11/2023 09:16 AM   Modules accepted: Orders

## 2023-02-11 NOTE — Telephone Encounter (Signed)
 Patient is returning call.

## 2023-02-11 NOTE — Telephone Encounter (Signed)
-----   Message from Eulas FORBES Furbish sent at 02/04/2023  3:08 PM EST ----- You sent this to me while  I was out on vacay. Yes. Eliquis  5 BID for 4 weeks prior to the ablation. ----- Message ----- From: Interface, [facility] Lab Results In Sent: 02/03/2023   8:35 PM EST To: Eulas FORBES Furbish, MD

## 2023-02-11 NOTE — Telephone Encounter (Signed)
 Spoke with patient, agrees with plan to begin Eliquis 5 mg twice daily on 02/21/23 - ablation scheduled 03/22/23.

## 2023-02-11 NOTE — Telephone Encounter (Signed)
 Attempted to contact patient, left message to call back. Patient needs to start eliqius 5 mg twice daily at least 4 week prior to ablation scheduled on 03/22/23.

## 2023-02-16 ENCOUNTER — Other Ambulatory Visit: Payer: Self-pay | Admitting: Family Medicine

## 2023-02-22 ENCOUNTER — Telehealth (HOSPITAL_COMMUNITY): Payer: Self-pay | Admitting: Emergency Medicine

## 2023-02-22 NOTE — Telephone Encounter (Signed)
Reaching out to patient to offer assistance regarding upcoming cardiac imaging study; pt verbalizes understanding of appt date/time, parking situation and where to check in, pre-test NPO status and medications ordered, and verified current allergies; name and call back number provided for further questions should they arise Cayne Yom RN Navigator Cardiac Imaging [facility] [phone] office [phone] cell 

## 2023-02-23 ENCOUNTER — Ambulatory Visit (HOSPITAL_COMMUNITY)
Admission: RE | Admit: 2023-02-23 | Discharge: 2023-02-23 | Disposition: A | Discharge status: Home / Self Care | Payer: Medicare HMO | Source: Ambulatory Visit | Attending: Cardiovascular Disease | Admitting: Cardiovascular Disease

## 2023-02-23 ENCOUNTER — Encounter: Payer: Self-pay | Admitting: Cardiovascular Disease

## 2023-02-23 DIAGNOSIS — I480 Paroxysmal atrial fibrillation: Secondary | ICD-10-CM | POA: Insufficient documentation

## 2023-02-23 MED ORDER — IOHEXOL 350 MG/ML SOLN
75.0000 mL | Freq: Once | INTRAVENOUS | Status: AC | PRN
  Administered 2023-02-23: 75 mL via INTRAVENOUS

## 2023-02-24 ENCOUNTER — Telehealth: Payer: Self-pay

## 2023-02-24 DIAGNOSIS — I480 Paroxysmal atrial fibrillation: Secondary | ICD-10-CM

## 2023-02-24 NOTE — Telephone Encounter (Signed)
I called pt to inform him that he had his labs done too early for his Ablation. He stated that he wasn't sure when he was supposed to go have them done. He will go Monday 1/27 to [facility] in [place] to have updated labs.

## 2023-03-09 ENCOUNTER — Telehealth: Payer: Self-pay | Admitting: Cardiovascular Disease

## 2023-03-09 NOTE — Telephone Encounter (Signed)
 Pt called in asking if his condition can be treated with medication first before getting ablation. Please advise.

## 2023-03-09 NOTE — Telephone Encounter (Signed)
 Spoke with patient and he states he discussed it with his wife and they are wondering if they can start medication to control his AFIB before doing ablation.

## 2023-03-10 NOTE — Telephone Encounter (Signed)
 Spoke with patient, doesn't believe he is currently in AF but last episode heart rate was controlled per pt. Patient doesn't wish to be started on any additional medication at this time but wanted to check if there was anything else he should be taking prior to procedure, confirmed patient is taking eliquis  as prescribed. Ablation currently scheduled for 03/22/23. No further needs

## 2023-03-11 DIAGNOSIS — I480 Paroxysmal atrial fibrillation: Secondary | ICD-10-CM | POA: Diagnosis not present

## 2023-03-12 LAB — CBC
Hematocrit: 48.2 % (ref 37.5–51.0)
Hemoglobin: 16.8 g/dL (ref 13.0–17.7)
MCH: 30.6 pg (ref 26.6–33.0)
MCHC: 34.9 g/dL (ref 31.5–35.7)
MCV: 88 fL (ref 79–97)
Platelets: 234 10*3/uL (ref 150–450)
RBC: 5.49 x10E6/uL (ref 4.14–5.80)
RDW: 12.6 % (ref 11.6–15.4)
WBC: 3.8 10*3/uL (ref 3.4–10.8)

## 2023-03-12 LAB — BASIC METABOLIC PANEL
BUN/Creatinine Ratio: 8 — ABNORMAL LOW (ref 10–24)
BUN: 9 mg/dL (ref 8–27)
CO2: 25 mmol/L (ref 20–29)
Calcium: 9.3 mg/dL (ref 8.6–10.2)
Chloride: 102 mmol/L (ref 96–106)
Creatinine, Ser: 1.12 mg/dL (ref 0.76–1.27)
Glucose: 149 mg/dL — ABNORMAL HIGH (ref 70–99)
Potassium: 3.9 mmol/L (ref 3.5–5.2)
Sodium: 145 mmol/L — ABNORMAL HIGH (ref 134–144)
eGFR: 75 mL/min/{1.73_m2} (ref >59)

## 2023-03-15 ENCOUNTER — Encounter: Payer: Self-pay | Admitting: Cardiovascular Disease

## 2023-03-21 ENCOUNTER — Telehealth: Payer: Self-pay | Admitting: Cardiovascular Disease

## 2023-03-21 ENCOUNTER — Other Ambulatory Visit (HOSPITAL_COMMUNITY): Payer: Self-pay

## 2023-03-21 ENCOUNTER — Telehealth: Payer: Self-pay

## 2023-03-21 NOTE — Telephone Encounter (Signed)
Pharmacy Patient Advocate Encounter   Received notification from Physician's Office that prior authorization for XARELTO is required/requested.   Insurance verification completed.   The patient is insured through [employer] .   Per test claim: The current 30 day co-pay is, $47.  No PA needed at this time. This test claim was processed through [facility]- copay amounts may vary at other pharmacies due to pharmacy/plan contracts, or as the patient moves through the different stages of their insurance plan.

## 2023-03-21 NOTE — Telephone Encounter (Signed)
Spoke with patient and shared message from Pharm D:  Patient copay appears to be 47 a month which is normal price. Is that what he is being charged?    Patient states he is being charged $297 to fill his Eliquis prescription. He states he has never had to pay out of pocket for the Eliquis.  Informed patient some insurances have a deductible that must be met before they start paying for Rx costs. Instructed patient to contact his insurance company to discuss setting up a payment plan for medication cost.  Patient verbalized understanding and will callback if any further issues/concerns.

## 2023-03-21 NOTE — Telephone Encounter (Signed)
Pharmacy Patient Advocate Encounter   Received notification from Physician's Office that prior authorization for ELIQUIS is required/requested.   Insurance verification completed.   The patient is insured through [employer] .   Per test claim: Refill too soon. PA is not needed at this time. Medication was filled 03/12/23. Next eligible fill date is 04/04/23.

## 2023-03-21 NOTE — Anesthesia Preprocedure Evaluation (Signed)
Anesthesia Evaluation  Patient identified by MRN, date of birth, ID band Patient awake    Reviewed: Allergy & Precautions, H&P , NPO status , Patient's Chart, lab work & pertinent test results  Airway Mallampati: II  TM Distance: >3 FB Neck ROM: Full    Dental no notable dental hx.    Pulmonary neg pulmonary ROS   Pulmonary exam normal breath sounds clear to auscultation       Cardiovascular hypertension, Normal cardiovascular exam+ dysrhythmias Atrial Fibrillation  Rhythm:Regular Rate:Normal     Neuro/Psych negative neurological ROS  negative psych ROS   GI/Hepatic Neg liver ROS,GERD  ,,  Endo/Other  negative endocrine ROS    Renal/GU negative Renal ROS  negative genitourinary   Musculoskeletal negative musculoskeletal ROS (+)    Abdominal   Peds negative pediatric ROS (+)  Hematology negative hematology ROS (+)   Anesthesia Other Findings   Reproductive/Obstetrics negative OB ROS                             Anesthesia Physical Anesthesia Plan  ASA: 3  Anesthesia Plan: General   Post-op Pain Management:    Induction: Intravenous  PONV Risk Score and Plan: 2 and Ondansetron, Dexamethasone and Treatment may vary due to age or medical condition  Airway Management Planned: Oral ETT  Additional Equipment:   Intra-op Plan:   Post-operative Plan: Extubation in OR  Informed Consent: I have reviewed the patients History and Physical, chart, labs and discussed the procedure including the risks, benefits and alternatives for the proposed anesthesia with the patient or authorized representative who has indicated his/her understanding and acceptance.     Dental advisory given  Plan Discussed with: CRNA  Anesthesia Plan Comments:        Anesthesia Quick Evaluation

## 2023-03-21 NOTE — Telephone Encounter (Signed)
Pt c/o medication issue:  1. Name of Medication: apixaban (ELIQUIS) 5 MG TABS tablet   2. How are you currently taking this medication (dosage and times per day)? As written  3. Are you having a reaction (difficulty breathing--STAT)? No   4. What is your medication issue? Pt states he cannot afford it

## 2023-03-21 NOTE — Pre-Procedure Instructions (Signed)
 Instructed patient on the following items: Arrival time 0515 Nothing to eat or drink after midnight No meds AM of procedure Responsible person to drive you home and stay with you for 24 hrs  Have you missed any doses of anti-coagulant Eliquis- takes twice a day, hasn't missed any doses.  Don't take dose in the morning.

## 2023-03-22 ENCOUNTER — Ambulatory Visit (HOSPITAL_COMMUNITY): Payer: Self-pay

## 2023-03-22 ENCOUNTER — Encounter (HOSPITAL_COMMUNITY)
Admission: RE | Disposition: A | Discharge status: Home / Self Care | Payer: Medicare HMO | Source: Home / Self Care | Attending: Cardiovascular Disease

## 2023-03-22 ENCOUNTER — Ambulatory Visit (HOSPITAL_COMMUNITY)
Admission: RE | Admit: 2023-03-22 | Discharge: 2023-03-22 | Disposition: A | Discharge status: Home / Self Care | Payer: Medicare HMO | Attending: Cardiovascular Disease | Admitting: Cardiovascular Disease

## 2023-03-22 ENCOUNTER — Encounter (HOSPITAL_COMMUNITY): Payer: Self-pay | Admitting: Cardiovascular Disease

## 2023-03-22 ENCOUNTER — Ambulatory Visit (HOSPITAL_BASED_OUTPATIENT_CLINIC_OR_DEPARTMENT_OTHER): Payer: Self-pay

## 2023-03-22 DIAGNOSIS — I4891 Unspecified atrial fibrillation: Secondary | ICD-10-CM | POA: Diagnosis not present

## 2023-03-22 DIAGNOSIS — I10 Essential (primary) hypertension: Secondary | ICD-10-CM

## 2023-03-22 DIAGNOSIS — Z79899 Other long term (current) drug therapy: Secondary | ICD-10-CM | POA: Insufficient documentation

## 2023-03-22 DIAGNOSIS — I480 Paroxysmal atrial fibrillation: Secondary | ICD-10-CM | POA: Insufficient documentation

## 2023-03-22 DIAGNOSIS — E785 Hyperlipidemia, unspecified: Secondary | ICD-10-CM

## 2023-03-22 DIAGNOSIS — D6869 Other thrombophilia: Secondary | ICD-10-CM | POA: Insufficient documentation

## 2023-03-22 HISTORY — PX: ATRIAL FIBRILLATION ABLATION: EP1191

## 2023-03-22 SURGERY — ATRIAL FIBRILLATION ABLATION
Anesthesia: General

## 2023-03-22 MED ORDER — CALCIUM CHLORIDE 10 % IV SOLN
INTRAVENOUS | Status: AC
  Filled 2023-03-22: qty 10

## 2023-03-22 MED ORDER — FENTANYL CITRATE (PF) 100 MCG/2ML IJ SOLN
INTRAMUSCULAR | Status: DC | PRN
  Administered 2023-03-22: 100 ug via INTRAVENOUS

## 2023-03-22 MED ORDER — SUGAMMADEX SODIUM 200 MG/2ML IV SOLN
INTRAVENOUS | Status: DC | PRN
  Administered 2023-03-22: 200 mg via INTRAVENOUS

## 2023-03-22 MED ORDER — ONDANSETRON HCL 4 MG/2ML IJ SOLN
INTRAMUSCULAR | Status: DC | PRN
  Administered 2023-03-22: 4 mg via INTRAVENOUS

## 2023-03-22 MED ORDER — PHENYLEPHRINE HCL-NACL 20-0.9 MG/250ML-% IV SOLN
INTRAVENOUS | Status: DC | PRN
  Administered 2023-03-22: 30 ug/min via INTRAVENOUS

## 2023-03-22 MED ORDER — ATROPINE SULFATE 1 MG/ML IV SOLN
INTRAVENOUS | Status: DC | PRN
  Administered 2023-03-22: 1 mg via INTRAVENOUS

## 2023-03-22 MED ORDER — PROTAMINE SULFATE 10 MG/ML IV SOLN
INTRAVENOUS | Status: DC | PRN
  Administered 2023-03-22: 50 mg via INTRAVENOUS

## 2023-03-22 MED ORDER — VASOPRESSIN 20 UNIT/ML IV SOLN
INTRAVENOUS | Status: AC
Start: 2023-03-22 — End: ?
  Filled 2023-03-22: qty 1

## 2023-03-22 MED ORDER — ACETAMINOPHEN 325 MG PO TABS: 650.0000 mg | ORAL_TABLET | ORAL | Status: DC | PRN

## 2023-03-22 MED ORDER — SODIUM CHLORIDE 0.9 % IV SOLN: 250.0000 mL | INTRAVENOUS | Status: DC | PRN

## 2023-03-22 MED ORDER — HEPARIN (PORCINE) IN NACL 1000-0.9 UT/500ML-% IV SOLN
INTRAVENOUS | Status: DC | PRN
  Administered 2023-03-22 (×3): 500 mL

## 2023-03-22 MED ORDER — PHENYLEPHRINE 80 MCG/ML (10ML) SYRINGE FOR IV PUSH (FOR BLOOD PRESSURE SUPPORT)
PREFILLED_SYRINGE | INTRAVENOUS | Status: DC | PRN
  Administered 2023-03-22: 160 ug via INTRAVENOUS

## 2023-03-22 MED ORDER — LIDOCAINE 2% (20 MG/ML) 5 ML SYRINGE
INTRAMUSCULAR | Status: DC | PRN
  Administered 2023-03-22: 100 mg via INTRAVENOUS

## 2023-03-22 MED ORDER — FENTANYL CITRATE (PF) 100 MCG/2ML IJ SOLN
INTRAMUSCULAR | Status: AC
  Filled 2023-03-22: qty 2

## 2023-03-22 MED ORDER — ATROPINE SULFATE 1 MG/10ML IJ SOSY
PREFILLED_SYRINGE | INTRAMUSCULAR | Status: AC
  Filled 2023-03-22: qty 10

## 2023-03-22 MED ORDER — LACTATED RINGERS IV SOLN
INTRAVENOUS | Status: DC | PRN
Start: 2023-03-22 — End: 2023-03-22

## 2023-03-22 MED ORDER — HEPARIN SODIUM (PORCINE) 1000 UNIT/ML IJ SOLN
INTRAMUSCULAR | Status: DC | PRN
Start: 2023-03-22 — End: 2023-03-22
  Administered 2023-03-22: 17000 [IU] via INTRAVENOUS

## 2023-03-22 MED ORDER — ROCURONIUM BROMIDE 10 MG/ML (PF) SYRINGE
PREFILLED_SYRINGE | INTRAVENOUS | Status: DC | PRN
  Administered 2023-03-22: 60 mg via INTRAVENOUS
  Administered 2023-03-22 (×2): 10 mg via INTRAVENOUS

## 2023-03-22 MED ORDER — SODIUM CHLORIDE 0.9 % IV SOLN: INTRAVENOUS | Status: DC

## 2023-03-22 MED ORDER — PROPOFOL 10 MG/ML IV BOLUS
INTRAVENOUS | Status: DC | PRN
  Administered 2023-03-22: 150 mg via INTRAVENOUS

## 2023-03-22 MED ORDER — DEXAMETHASONE SODIUM PHOSPHATE 10 MG/ML IJ SOLN
INTRAMUSCULAR | Status: DC | PRN
  Administered 2023-03-22: 10 mg via INTRAVENOUS

## 2023-03-22 MED ORDER — SODIUM CHLORIDE 0.9% FLUSH: 3.0000 mL | Freq: Two times a day (BID) | INTRAVENOUS | Status: DC

## 2023-03-22 MED ORDER — ONDANSETRON HCL 4 MG/2ML IJ SOLN: 4.0000 mg | Freq: Four times a day (QID) | INTRAMUSCULAR | Status: DC | PRN

## 2023-03-22 MED ORDER — SODIUM CHLORIDE 0.9% FLUSH: 3.0000 mL | INTRAVENOUS | Status: DC | PRN

## 2023-03-22 SURGICAL SUPPLY — 19 items
BAG SNAP BAND KOVER 36X36 (MISCELLANEOUS) IMPLANT
CABLE PFA RX CATH CONN (CABLE) IMPLANT
CATH FARAWAVE ABLATION 31 (CATHETERS) IMPLANT
CATH OCTARAY 2.0 F 3-3-3-3-3 (CATHETERS) IMPLANT
CATH SOUNDSTAR ECO 8FR (CATHETERS) IMPLANT
CATH WEBSTER BI DIR CS D-F CRV (CATHETERS) IMPLANT
CLOSURE PERCLOSE PROSTYLE (VASCULAR PRODUCTS) IMPLANT
COVER SWIFTLINK CONNECTOR (BAG) ×2 IMPLANT
DEVICE CLOSURE MYNXGRIP 6/7F (Vascular Products) IMPLANT
DILATOR VESSEL 38 20CM 16FR (INTRODUCER) IMPLANT
GUIDEWIRE INQWIRE 1.5J.035X260 (WIRE) IMPLANT
INQWIRE 1.5J .035X260CM (WIRE) ×1 IMPLANT
KIT VERSACROSS CNCT FARADRIVE (KITS) IMPLANT
PACK EP LF (CUSTOM PROCEDURE TRAY) ×2 IMPLANT
PAD DEFIB RADIO PHYSIO CONN (PAD) ×2 IMPLANT
SHEATH FARADRIVE STEERABLE (SHEATH) IMPLANT
SHEATH PINNACLE 8F 10CM (SHEATH) IMPLANT
SHEATH PINNACLE 9F 10CM (SHEATH) IMPLANT
SHEATH PROBE COVER 6X72 (BAG) IMPLANT

## 2023-03-22 NOTE — H&P (Signed)
Electrophysiology Office Note:    Date:  03/22/2023   ID:  Christopher [name], DOB May 26, 1960, MRN [id number]  PCP:  Babs Sciara, MD   [facility] Providers Cardiologist:  Dina Rich, MD Electrophysiologist:  Maurice Small, MD     Referring MD: No ref. provider found   History of Present Illness:    Christopher [name] is a 63 y.o. male with a medical history significant for atrial fibrillation, hypertension, hyperlipidemia, referred for arrhythmia management.     I discussed the use of AI scribe software for clinical note transcription with the patient, who gave verbal consent to proceed.  He was diagnosed with atrial fibrillation (AFib) after a knee surgery in 2019. The patient was managed with a beta-blocker but discontinued it in 2021. The patient was not on anticoagulation due to a low CHA2DS2-VASc score. The patient was admitted to the hospital in June 2024 for episodes of shoulder pain, during which EKG showed AFib and flutter with controlled rates. The patient missed a follow-up visit with cardiology in July. The patient was recently found to have a 24% burden of AFib on a 14-day Zio monitor. The patient reports feeling palpitations and "like a wet dog" at times.  The patient does not recall hitting the button on the Zio monitor to indicate symptoms.     Today, he reports that he feels well and is at baseline.  I reviewed the patient's CT and labs. There was no LAA thrombus. he  has not missed any doses of anticoagulation, and he took his dose last night. There have been no changes in the patient's diagnoses, medications, or condition since our recent clinic visit.   EKGs/Labs/Other Studies Reviewed Today:     Echocardiogram:  TTE January 2019 EF 60 to 65% normal valve structure   Monitors:  14-day ZIO --  my interpretation Sinus rhythm 40-171 beats minute, average 73 28% burden of atrial fibrillation , 40 to 217 bpm, average 94 bpm 2 pauses of about 3  seconds occurred in the a.m. and were not associated with symptoms Patient triggered episodes were associated with atrial fibrillation     EKG:         Physical Exam:    VS:  BP (!) 140/84   Pulse 61   Temp 98.1 F (36.7 C) (Oral)   Resp 16   Ht 5\' 11"  (1.803 m)   Wt 89.8 kg   SpO2 98%   BMI 27.62 kg/m     Wt Readings from Last 3 Encounters:  03/22/23 89.8 kg  12/27/22 90.4 kg  12/21/22 88.5 kg     GEN: Well nourished, well developed in no acute distress CARDIAC: RRR, no murmurs, rubs, gallops RESPIRATORY:  Normal work of breathing MUSCULOSKELETAL: no edema    ASSESSMENT & PLAN:     Paroxysmal atrial fibrillation Occasional symptoms of lightheadedness, dizziness possibly due to extreme RVR Rates are occasionally very rapid We discussed management options.  He would like to proceed with ablation Start metoprolol 25mg  PO BID  We discussed the indication, rationale, logistics, anticipated benefits, and potential risks of the ablation procedure including but not limited to -- bleed at the groin access site, chest pain, damage to nearby organs such as the diaphragm, lungs, or esophagus, need for a drainage tube, or prolonged hospitalization. I explained that the risk for stroke, heart attack, need for open chest surgery, or even death is very low but not zero. he  expressed understanding and wishes to  proceed.   Secondary hypercoagulable state CHA2DS2-VASc score is 1 for hypertension Will need to take anticoagulation during the periprocedural period      Signed, Maurice Small, MD  03/22/2023 7:21 AM    [facility]

## 2023-03-22 NOTE — Anesthesia Procedure Notes (Signed)
Procedure Name: Intubation Date/Time: 03/22/2023 7:56 AM  Performed by: Alwyn Ren, CRNAPre-anesthesia Checklist: Patient identified, Emergency Drugs available, Suction available and Patient being monitored Patient Re-evaluated:Patient Re-evaluated prior to induction Oxygen Delivery Method: Circle system utilized Preoxygenation: Pre-oxygenation with 100% oxygen Induction Type: IV induction Ventilation: Mask ventilation without difficulty Laryngoscope Size: Miller and 2 Grade View: Grade II Tube type: Oral Tube size: 7.0 mm Number of attempts: 2 Airway Equipment and Method: Stylet and Oral airway Placement Confirmation: ETT inserted through vocal cords under direct vision, positive ETCO2 and breath sounds checked- equal and bilateral Secured at: 23 cm Tube secured with: Tape Dental Injury: Teeth and Oropharynx as per pre-operative assessment  Comments: 2b view. Intubated second attempt by Dr. Charlynn Grimes

## 2023-03-22 NOTE — Anesthesia Postprocedure Evaluation (Signed)
Anesthesia Post Note  Patient: Christopher [name]  Procedure(s) Performed: ATRIAL FIBRILLATION ABLATION     Patient location during evaluation: PACU Anesthesia Type: General Level of consciousness: awake and alert Pain management: pain level controlled Vital Signs Assessment: post-procedure vital signs reviewed and stable Respiratory status: spontaneous breathing, nonlabored ventilation, respiratory function stable and patient connected to nasal cannula oxygen Cardiovascular status: blood pressure returned to baseline and stable Postop Assessment: no apparent nausea or vomiting Anesthetic complications: no   No notable events documented.  Last Vitals:  Vitals:   03/22/23 0945 03/22/23 1000  BP: 102/68 105/71  Pulse: 77 74  Resp: 18 16  Temp: 36.5 C   SpO2: 93% 95%    Last Pain:  Vitals:   03/22/23 0945  TempSrc: Oral  PainSc:                  [place]

## 2023-03-22 NOTE — Discharge Instructions (Signed)

## 2023-03-22 NOTE — Progress Notes (Signed)
Up and walked and tolerated well; bilat groins stable, no bleeding or hematoma;Dr Mealor in and ok to d/c home

## 2023-03-22 NOTE — Transfer of Care (Signed)
Immediate Anesthesia Transfer of Care Note  Patient: Christopher [name]  Procedure(s) Performed: ATRIAL FIBRILLATION ABLATION  Patient Location: PACU  Anesthesia Type:General  Level of Consciousness: awake  Airway & Oxygen Therapy: Patient Spontanous Breathing  Post-op Assessment: Post -op Vital signs reviewed and stable  Post vital signs: Reviewed and stable  Last Vitals:  Vitals Value Taken Time  BP    Temp    Pulse 75 03/22/23 0923  Resp 15 03/22/23 0923  SpO2 100 % 03/22/23 0923  Vitals shown include unfiled device data.  Last Pain:  Vitals:   03/22/23 0627  TempSrc: Oral  PainSc:          Complications: No notable events documented.

## 2023-03-23 ENCOUNTER — Telehealth (HOSPITAL_COMMUNITY): Payer: Self-pay

## 2023-03-23 LAB — POCT ACTIVATED CLOTTING TIME: Activated Clotting Time: 400 s

## 2023-03-23 NOTE — Telephone Encounter (Signed)
Spoke with patient to complete post procedure follow up call.  Patient reports no complications with groin sites.   Instructions reviewed with patient:  Remove large bandage at puncture site after 24 hours. It is normal to have bruising, tenderness and a pea or marble sized lump/knot at the groin site which can take up to three months to resolve.  Get help right away if you notice sudden swelling at the puncture site.  Check your puncture site every day for signs of infection: fever, redness, swelling, pus drainage, warmth, foul odor or excessive pain. If this occurs, please call the office at [phone], to speak with the nurse. Get help right away if your puncture site is bleeding and the bleeding does not stop after applying firm pressure to the area.  You may continue to have skipped beats/ atrial fibrillation during the first several months after your procedure.  It is very important not to miss any doses of your blood thinner Eliquis. Patient restarted taking this medication on yesterday, 03/22/23.   You will follow up with the Afib clinic on 04/19/23 and follow up with Dr.Augustus Mealor on 07/08/23.   Patient verbalized understanding to all instructions provided.

## 2023-03-25 MED FILL — Calcium Chloride Inj 10%: INTRAVENOUS | Qty: 10 | Status: AC

## 2023-03-25 MED FILL — Vasopressin IV Soln 20 Unit/ML (For IV Infusion): INTRAVENOUS | Qty: 1 | Status: AC

## 2023-03-31 ENCOUNTER — Encounter: Payer: Self-pay | Admitting: Emergency Medicine

## 2023-04-19 ENCOUNTER — Encounter (HOSPITAL_COMMUNITY): Payer: Self-pay | Admitting: Physician Assistant

## 2023-04-19 ENCOUNTER — Ambulatory Visit (HOSPITAL_COMMUNITY)
Admission: RE | Admit: 2023-04-19 | Discharge: 2023-04-19 | Disposition: A | Discharge status: Home / Self Care | Payer: Medicare HMO | Source: Ambulatory Visit | Attending: Physician Assistant | Admitting: Physician Assistant

## 2023-04-19 VITALS — BP 136/80 | HR 72 | Ht 71.0 in | Wt 199.8 lb

## 2023-04-19 DIAGNOSIS — I10 Essential (primary) hypertension: Secondary | ICD-10-CM | POA: Diagnosis not present

## 2023-04-19 DIAGNOSIS — I480 Paroxysmal atrial fibrillation: Secondary | ICD-10-CM | POA: Diagnosis not present

## 2023-04-19 DIAGNOSIS — Z7901 Long term (current) use of anticoagulants: Secondary | ICD-10-CM | POA: Insufficient documentation

## 2023-04-19 NOTE — Progress Notes (Signed)
 Primary Care Physician: Babs Sciara, MD Primary Cardiologist: Dina Rich, MD Electrophysiologist: Maurice Small, MD  Referring Physician: [name] is a 63 y.o. male with a history of HTN, HLD, atrial fibrillation who presents for follow up in the [facility].  The patient was initially diagnosed with atrial fibrillation in 2019 following knee surgery. He wore a cardiac monitor 09/2022 which showed 24% afib burden. He underwent afib ablation with Dr Nelly Laurence on 03/22/23. Patient is on Eliquis for stroke prevention.   Patient presents today for follow up for atrial fibrillation. He remains in SR, he denies any interim symptoms of afib. He denies chest pain or groin issues.   Today, he denies symptoms of palpitations, chest pain, shortness of breath, orthopnea, PND, lower extremity edema, dizziness, presyncope, syncope, snoring, daytime somnolence, bleeding, or neurologic sequela. The patient is tolerating medications without difficulties and is otherwise without complaint today.    Atrial Fibrillation Risk Factors:  he does not have symptoms or diagnosis of sleep apnea. he does not have a history of rheumatic fever.   Atrial Fibrillation Management history:  Previous antiarrhythmic drugs: none Previous cardioversions: none Previous ablations: 03/22/23 Anticoagulation history: Eliquis  ROS- All systems are reviewed and negative except as per the HPI above.  Past Medical History:  Diagnosis Date   Arthritis    Atrial fibrillation (HCC)    a. occuring in the post-operative setting in 02/2017   GERD (gastroesophageal reflux disease)    HTN (hypertension)    Hyperlipemia     Current Outpatient Medications  Medication Sig Dispense Refill   amLODipine (NORVASC) 10 MG tablet TAKE 1 TABLET BY MOUTH EVERY DAY 90 tablet 0   apixaban (ELIQUIS) 5 MG TABS tablet Take 1 tablet by mouth twice daily - to start on 02/21/23 60 tablet 6    famotidine (PEPCID) 40 MG tablet TAKE 1 TABLET(40 MG) BY MOUTH DAILY 90 tablet 0   meloxicam (MOBIC) 15 MG tablet TAKE 1 TABLET(15 MG) BY MOUTH DAILY AS NEEDED FOR RIGHT SHOULDER PAIN OR ARM PAIN 30 tablet 1   rosuvastatin (CRESTOR) 20 MG tablet TAKE 1 TABLET(20 MG) BY MOUTH DAILY 90 tablet 0   No current facility-administered medications for this encounter.    Physical Exam: BP 136/80   Pulse 72   Ht 5\' 11"  (1.803 m)   Wt 90.6 kg   BMI 27.87 kg/m   GEN: Well nourished, well developed in no acute distress CARDIAC: Regular rate and rhythm, no murmurs, rubs, gallops RESPIRATORY:  Clear to auscultation without rales, wheezing or rhonchi  ABDOMEN: Soft, non-tender, non-distended EXTREMITIES:  No edema; No deformity   Wt Readings from Last 3 Encounters:  04/19/23 90.6 kg  03/22/23 89.8 kg  12/27/22 90.4 kg     EKG today demonstrates  SR Vent. rate 72 BPM PR interval 176 ms QRS duration 82 ms QT/QTcB 376/411 ms  Echo 02/16/17 demonstrated  - Left ventricle: The cavity size was normal. Wall thickness was    increased in a pattern of mild LVH. Systolic function was normal.    The estimated ejection fraction was in the range of 60% to 65%.    Wall motion was normal; there were no regional wall motion    abnormalities. Left ventricular diastolic function parameters    were normal.  - Pulmonic valve: There was mild regurgitation.     CHA2DS2-VASc Score = 1  The patient's score is based upon: CHF  History: 0 HTN History: 1 Diabetes History: 0 Stroke History: 0 Vascular Disease History: 0 Age Score: 0 Gender Score: 0       ASSESSMENT AND PLAN: Paroxysmal Atrial Fibrillation (ICD10:  I48.0) The patient's CHA2DS2-VASc score is 1, indicating a 0.6% annual risk of stroke.   S/p afib ablation 03/22/23 Patient appears to be maintaining SR.  Continue Eliquis 5 mg BID with no missed doses for 3 months post ablation.   HTN Stable on current regimen   Follow up with Dr  Nelly Laurence as scheduled.        [name] PA-C Afib Clinic [facility] [address] [place], [place] [address] [phone]

## 2023-04-22 ENCOUNTER — Other Ambulatory Visit: Payer: Self-pay | Admitting: Family Medicine

## 2023-05-30 ENCOUNTER — Encounter: Payer: Self-pay | Admitting: Nurse Practitioner

## 2023-05-30 ENCOUNTER — Ambulatory Visit (INDEPENDENT_AMBULATORY_CARE_PROVIDER_SITE_OTHER): Admitting: Nurse Practitioner

## 2023-05-30 ENCOUNTER — Ambulatory Visit: Payer: Self-pay

## 2023-05-30 VITALS — BP 134/82 | HR 73 | Temp 98.1°F | Ht 71.0 in | Wt 196.0 lb

## 2023-05-30 DIAGNOSIS — M7541 Impingement syndrome of right shoulder: Secondary | ICD-10-CM

## 2023-05-30 DIAGNOSIS — G8929 Other chronic pain: Secondary | ICD-10-CM

## 2023-05-30 DIAGNOSIS — M25511 Pain in right shoulder: Secondary | ICD-10-CM

## 2023-05-30 MED ORDER — OXYCODONE-ACETAMINOPHEN 5-325 MG PO TABS: 1.0000 | ORAL_TABLET | ORAL | 0 refills | Status: AC | PRN

## 2023-05-30 NOTE — Telephone Encounter (Signed)
 Noted.

## 2023-05-30 NOTE — Progress Notes (Signed)
 Subjective:    Patient ID: Christopher [name], male    DOB: 09/17/1960, 63 y.o.   MRN: [id number]  HPI Presents for recheck of chronic right shoulder pain.  Last seen for this at our office on 12/21/2022.  Has had MRI and injection in the past.  Symptoms have been getting steadily worse.  Limited rotation of the shoulder.  Burning pain from the shoulder down to the hand.  No weakness but pain noted with any lifting or pulling.  No specific history of injury.  His wife is disabled and he has to lift and pull to help her with ambulation and movement.  States she weighs about 98 pounds.  Patient has tried topical analgesics, lidocaine  patches and Tylenol .  Is currently on Eliquis  and limited in what he can take.  According to patient he may be coming off this soon depending on his upcoming cardiology appointment.  Patient tried some oxycodone  10 mg a couple of times that he has access to at home.  States it greatly relieved his symptoms.  Denies any adverse effects.  Describes his pain during the day as 7/8 out of 10.  At nighttime pain is significantly worse running well over a 10, describing it as a 15.   Review of Systems  Constitutional:  Positive for fatigue.  Respiratory:  Negative for cough, chest tightness and shortness of breath.   Cardiovascular:  Negative for chest pain.  Musculoskeletal:        Right shoulder and arm pain      05/30/2023   10:20 AM  Depression screen PHQ 2/9  Decreased Interest 0  Down, Depressed, Hopeless 0  PHQ - 2 Score 0  Altered sleeping 0  Tired, decreased energy 0  Change in appetite 0  Feeling bad or failure about yourself  0  Trouble concentrating 0  Moving slowly or fidgety/restless 0  Suicidal thoughts 0  PHQ-9 Score 0  Difficult doing work/chores Not difficult at all      05/30/2023   10:20 AM 08/25/2022    4:14 PM 07/12/2022    3:12 PM  GAD 7 : Generalized Anxiety Score  Nervous, Anxious, on Edge 0 0 0  Control/stop worrying 0 0 0  Worry too  much - different things 0 0 1  Trouble relaxing 0 0 0  Restless 1 0 0  Easily annoyed or irritable 0 0 0  Afraid - awful might happen 0 0 0  Total GAD 7 Score 1 0 1  Anxiety Difficulty Not difficult at all Not difficult at all Not difficult at all         Objective:   Physical Exam NAD.  Alert, oriented.  Lungs clear.  Heart regular rate rhythm.  Slight increased sloping noted in the right shoulder with anterior positioning.  Tenderness noted around the [facility] joint and anterior shoulder joint.  Patient cannot rotate his shoulder above shoulder joint line.  Limited movement above his head compared to the left.  Internal and external rotation limited.  Hand and arm strength 5+ bilaterally.  Strong radial pulse.  Tenderness noted with palpation of the arm down to the hand.  Sensation grossly intact. Today's Vitals   05/30/23 1015  BP: 134/82  Pulse: 73  Temp: 98.1 F (36.7 C)  SpO2: 97%  Weight: 196 lb (88.9 kg)  Height: 5\' 11"  (1.803 m)   Body mass index is 27.34 kg/m.  EXAM: MRI OF THE RIGHT SHOULDER WITHOUT CONTRAST   TECHNIQUE: Multiplanar, multisequence  MR imaging of the shoulder was performed. No intravenous contrast was administered.   COMPARISON:  Radiographs dated Jun 04, 2021   FINDINGS: Rotator cuff: Supraspinatus tendinopathy without discrete tear. Infraspinatus tendon is intact. Teres minor tendon is intact. Subscapularis tendon is intact.   Muscles: No muscle atrophy or edema. No intramuscular fluid collection or hematoma.   Biceps Long Head: Intraarticular and extraarticular portions of the biceps tendon are intact.   Acromioclavicular Joint: Severe arthropathy of the acromioclavicular joint characterized by joint capsular thickening subchondral cystic changes and prominent subacromial osteophytes. No subacromial/subdeltoid bursal fluid.   Glenohumeral Joint: No joint effusion. Near complete loss of the articular cartilage with subchondral cystic changes  of the glenoid.   Labrum: Advanced degeneration/diminution of the labrum without evidence of acute tear.   Bones: No fracture or dislocation. No aggressive osseous lesion. Large humeral head and neck junction and glenoid marginal osteophytes. Intraosseous cystic changes about the posterior aspect of the humeral head about the insertion of the infraspinatus.   Other: No fluid collection or hematoma.   IMPRESSION: 1.  Severe glenohumeral osteoarthritis.   2.  Severe acromioclavicular osteoarthritis.   3. Mild supraspinatus tendinopathy without discrete tear. The remaining rotator cuff tendons are unremarkable. No rotator cuff muscle atrophy.   4.  No appreciable joint effusion.     Electronically Signed   By: Imran  Ahmed D.O.   On: 06/24/2021 09:26       Assessment & Plan:   Problem List Items Addressed This Visit       Musculoskeletal and Integument   Impingement syndrome of right shoulder   Relevant Medications   oxyCODONE -acetaminophen  (PERCOCET/ROXICET) 5-325 MG tablet   Other Relevant Orders   Ambulatory referral to Orthopedic Surgery     Other   Right shoulder pain - Primary   Relevant Orders   Ambulatory referral to Orthopedic Surgery   Meds ordered this encounter  Medications   oxyCODONE -acetaminophen  (PERCOCET/ROXICET) 5-325 MG tablet    Sig: Take 1 tablet by mouth every 4 (four) hours as needed for up to 5 days.    Dispense:  30 tablet    Refill:  0    Has taken oxycodone  recently without any itching or side effects    Supervising Provider:   Charlotta Cook A [[id number]]   Referred to orthopedic surgery as soon as possible for evaluation and treatment. Patient has a history of itching with hydrocodone  but was able to take oxycodone  without any difficulty.  Will also need to avoid oral NSAIDs due to current Eliquis  use.  Short-term prescription for oxycodone  5 mg as directed.  Drowsiness precautions.  Mainly use for nighttime due to the severity of his  pain.  Recommend topicals.  Gentle exercises.  The most important thing is to get him back to orthopedics due to the severity of his symptoms.  He agrees with this plan. Recheck here as needed.

## 2023-05-30 NOTE — Telephone Encounter (Signed)
 Chief Complaint: right shoulder pain Symptoms: right shoulder pain, intermittent left sided chest pain, intermittent SOB Frequency: x weeks-months Pertinent Negatives: Patient denies injury, chronic neck or back pain, heart attack, nausea, vomiting, SOB, fever, dizziness Disposition: [x] ED /[] Urgent Care (no appt availability in office) / [] Appointment(In office/virtual)/ []  [place] [facility]/ [] Home Care/ [] Refused Recommended Disposition /[] [place] [facility]/ []  Follow-up with PCP Additional Notes: Patient states he has been taking Tylenol  for his right shoulder pain (chronic). Patient requesting an appointment for his right shoulder pain. While triaging patient he admitted to chest pain as well intermittently with SOB. Patient states he has not been seen for his chest pain. Advised patient to go to ED right away and follow up with cardiology after ED visit. Patient agreeable.  Copied from CRM [id number]. Topic: Clinical - Red Word Triage >> May 30, 2023  8:44 AM Donald Frost wrote: Red Word that prompted transfer to Nurse Triage: The patient called in stating he has been having severe right shoulder pain. He says sometimes he can't even lay on his right side and since it is worse at night it is hard for him to sleep. I will transfer him to [facility] Reason for Disposition  Pain also in shoulder(s) or arm(s) or jaw  (Exception: Pain is clearly made worse by movement.)  Answer Assessment - Initial Assessment Questions 1. ONSET: "When did the pain start?"     X 3-5 months. Worsening.  2. LOCATION: "Where is the pain located?"     Right shoulder radiates down to hand.  3. PAIN: "How bad is the pain?" (Scale 1-10; or mild, moderate, severe)   - MILD (1-3): doesn't interfere with normal activities   - MODERATE (4-7): interferes with normal activities (e.g., work or school) or awakens from sleep   - SEVERE (8-10): excruciating pain, unable to do any normal activities, unable to move arm at all  due to pain     5/10 at present, states at night it can get severe.  4. WORK OR EXERCISE: "Has there been any recent work or exercise that involved this part of the body?"     Denies.  5. CAUSE: "What do you think is causing the shoulder pain?"     Unsure.  6. OTHER SYMPTOMS: "Do you have any other symptoms?" (e.g., neck pain, swelling, rash, fever, numbness, weakness)     Tingling/burning intermittent, intermittent back pain  7. PREGNANCY: "Is there any chance you are pregnant?" "When was your last menstrual period?"     N/A  Answer Assessment - Initial Assessment Questions 1. LOCATION: "Where does it hurt?"       Left sided.  2. RADIATION: "Does the pain go anywhere else?" (e.g., into neck, jaw, arms, back)     Denies.  3. ONSET: "When did the chest pain begin?" (Minutes, hours or days)      He states it has been weeks.  4. PATTERN: "Does the pain come and go, or has it been constant since it started?"  "Does it get worse with exertion?"      Comes and goes, states it is random. He states he feels it in the early morning and then it goes away.  5. DURATION: "How long does it last" (e.g., seconds, minutes, hours)     Couple minutes (sometimes 5 minutes and sometimes less).  6. SEVERITY: "How bad is the pain?"  (e.g., Scale 1-10; mild, moderate, or severe)    - MILD (1-3): doesn't interfere with normal  activities     - MODERATE (4-7): interferes with normal activities or awakens from sleep    - SEVERE (8-10): excruciating pain, unable to do any normal activities       0/10, not present now.  7. CARDIAC RISK FACTORS: "Do you have any history of heart problems or risk factors for heart disease?" (e.g., angina, prior heart attack; diabetes, high blood pressure, high cholesterol, smoker, or strong family history of heart disease)     HE states he has a defibrillator   8. PULMONARY RISK FACTORS: "Do you have any history of lung disease?"  (e.g., blood clots in lung, asthma,  emphysema, birth control pills)     Denies.  9. CAUSE: "What do you think is causing the chest pain?"     Unsure.  10. OTHER SYMPTOMS: "Do you have any other symptoms?" (e.g., dizziness, nausea, vomiting, sweating, fever, difficulty breathing, cough)       Denies.  11. PREGNANCY: "Is there any chance you are pregnant?" "When was your last menstrual period?"       N/A.  Protocols used: Shoulder Pain-A-AH, Chest Pain-A-AH

## 2023-06-06 ENCOUNTER — Telehealth: Payer: Self-pay | Admitting: Cardiovascular Disease

## 2023-06-06 NOTE — Telephone Encounter (Signed)
 Pt c/o medication issue:  1. Name of Medication: apixaban  (ELIQUIS ) 5 MG TABS tablet   2. How are you currently taking this medication (dosage and times per day)? Take 1 tablet by mouth twice daily - to start on 02/21/23   3. Are you having a reaction (difficulty breathing--STAT)? No  4. What is your medication issue? Pt would like a c/b regarding when and if he is able to stop taking medication. Please advise

## 2023-06-10 ENCOUNTER — Encounter (INDEPENDENT_AMBULATORY_CARE_PROVIDER_SITE_OTHER): Payer: Self-pay | Admitting: *Deleted

## 2023-06-13 ENCOUNTER — Encounter: Payer: Self-pay | Admitting: Family Medicine

## 2023-06-13 ENCOUNTER — Ambulatory Visit (INDEPENDENT_AMBULATORY_CARE_PROVIDER_SITE_OTHER): Admitting: Family Medicine

## 2023-06-13 ENCOUNTER — Ambulatory Visit: Payer: Self-pay

## 2023-06-13 VITALS — BP 142/84 | HR 83 | Temp 97.3°F | Ht 71.0 in | Wt 197.0 lb

## 2023-06-13 DIAGNOSIS — M1712 Unilateral primary osteoarthritis, left knee: Secondary | ICD-10-CM | POA: Diagnosis not present

## 2023-06-13 MED ORDER — OXYCODONE-ACETAMINOPHEN 5-325 MG PO TABS: 1.0000 | ORAL_TABLET | Freq: Three times a day (TID) | ORAL | 0 refills | Status: DC | PRN

## 2023-06-13 NOTE — Patient Instructions (Signed)
 Rest, ice.  Medication as directed.  Referral placed to Dr. Phyllis Breeze.

## 2023-06-13 NOTE — Progress Notes (Signed)
 Subjective:  Patient ID: Christopher [name], male    DOB: 03-11-1960  Age: 63 y.o. MRN: [id number]  CC:   Chief Complaint  Patient presents with   Knee Pain    Knee pain and swelling     HPI:  63 year old male presents with left knee pain.   Ongoing over the past 1-2 months. Located laterally. Severe at times. Worse with activity. Associated swelling. Has known OA. Better with rest. Has not seen Ortho. Cannot use NSAID due to Eliquis .   Patient Active Problem List   Diagnosis Date Noted   Primary osteoarthritis of left knee 06/13/2023   Paroxysmal atrial fibrillation (HCC) 04/19/2023   Right shoulder pain 12/21/2022   Prediabetes 10/15/2021   Arthrofibrosis of knee joint, right s/p Manipulation 04/05/17 04/05/2017   S/P total knee replacement, right 02/15/17 03/01/2017   Primary osteoarthritis of both knees 10/13/2016   Hyperlipemia 04/16/2013   HTN (hypertension), benign 04/16/2013   Impingement syndrome of right shoulder 03/08/2011    Social Hx   Social History   Socioeconomic History   Marital status: Married    Spouse name: Tammy   Number of children: 2   Years of education: 12   Highest education level: Not on file  Occupational History   Occupation: truck Air traffic controller: [employer]  Tobacco Use   Smoking status: Never   Smokeless tobacco: Never   Tobacco comments:    Never smoked 04/19/23  Vaping Use   Vaping status: Never Used  Substance and Sexual Activity   Alcohol use: Not Currently    Comment: occasional beer   Drug use: No   Sexual activity: Yes    Birth control/protection: None  Other Topics Concern   Not on file  Social History Narrative   1 son and 1 daughter   3 grandchildren   Social Drivers of Corporate investment banker Strain: Low Risk  (12/17/2022)   Overall Financial Resource Strain (CARDIA)    Difficulty of Paying Living Expenses: Not hard at all  Food Insecurity: No Food Insecurity (12/17/2022)   Hunger Vital Sign     Worried About Running Out of Food in the Last Year: Never true    Ran Out of Food in the Last Year: Never true  Transportation Needs: No Transportation Needs (12/17/2022)   PRAPARE - Administrator, Civil Service (Medical): No    Lack of Transportation (Non-Medical): No  Physical Activity: Insufficiently Active (12/17/2022)   Exercise Vital Sign    Days of Exercise per Week: 3 days    Minutes of Exercise per Session: 30 min  Stress: No Stress Concern Present (12/17/2022)   [employer] of Occupational Health - Occupational Stress Questionnaire    Feeling of Stress : Not at all  Social Connections: Moderately Integrated (12/17/2022)   Social Connection and Isolation Panel [NHANES]    Frequency of Communication with Friends and Family: More than three times a week    Frequency of Social Gatherings with Friends and Family: Three times a week    Attends Religious Services: More than 4 times per year    Active Member of Clubs or Organizations: No    Attends Banker Meetings: Never    Marital Status: Married    Review of Systems Per HPI  Objective:  BP (!) 142/84   Pulse 83   Temp (!) 97.3 F (36.3 C)   Ht 5\' 11"  (1.803 m)   Wt 197  lb (89.4 kg)   SpO2 96%   BMI 27.48 kg/m      06/13/2023    4:08 PM 05/30/2023   10:15 AM 04/19/2023   11:06 AM  BP/Weight  Systolic BP 142 134 136  Diastolic BP 84 82 80  Wt. (Lbs) 197 196 199.8  BMI 27.48 kg/m2 27.34 kg/m2 27.87 kg/m2    Physical Exam Vitals and nursing note reviewed.  Constitutional:      General: [name] not in acute distress.    Appearance: Normal appearance.  HENT:     Head: Normocephalic and atraumatic.  Pulmonary:     Effort: Pulmonary effort is normal. No respiratory distress.  Musculoskeletal:     Comments: Left knee -ligaments intact.  No appreciable effusion.  Neurological:     Mental Status: [name] alert.     Lab Results  Component Value Date   WBC 3.8 03/11/2023   HGB 16.8  03/11/2023   HCT 48.2 03/11/2023   PLT 234 03/11/2023   GLUCOSE 149 (H) 03/11/2023   CHOL 188 10/12/2021   TRIG 99 10/12/2021   HDL 47 10/12/2021   LDLCALC 123 (H) 10/12/2021   ALT 28 07/05/2022   AST 22 07/05/2022   NA 145 (H) 03/11/2023   K 3.9 03/11/2023   CL 102 03/11/2023   CREATININE 1.12 03/11/2023   BUN 9 03/11/2023   CO2 25 03/11/2023   TSH 1.140 09/10/2022   PSA 1.41 06/12/2015   INR 1.03 02/09/2017   HGBA1C 6.3 (H) 10/12/2021     Assessment & Plan:  Primary osteoarthritis of left knee Assessment & Plan: Referring back to Ortho.  Oxycodone  as needed for severe pain.  Orders: -     Ambulatory referral to Orthopedic Surgery  Other orders -     oxyCODONE -Acetaminophen ; Take 1 tablet by mouth every 8 (eight) hours as needed for severe pain (pain score 7-10).  Dispense: 15 tablet; Refill: 0   Gaberial Cada DO [facility]

## 2023-06-13 NOTE — Telephone Encounter (Signed)
 noted

## 2023-06-13 NOTE — Telephone Encounter (Signed)
 Left a message for patient to call office back regarding Eliquis .   Patient able to stop medication 12m post ablation (06/19/23) if no missed doses.

## 2023-06-13 NOTE — Telephone Encounter (Signed)
  Chief Complaint: knee swelling Symptoms: left knee swelling and painful when bending or walking Frequency: x 1-2 months Pertinent Negatives: Patient denies recent injury or overuse, redness, chest pain, SOB, calf pain, fever. Disposition: [] ED /[] Urgent Care (no appt availability in office) / [x] Appointment(In office/virtual)/ []  [place] [facility]/ [] Home Care/ [] Refused Recommended Disposition /[] [place] [facility]/ []  Follow-up with PCP Additional Notes: Patient states he had surgery on his right knee in the past. He states now pain and swelling in his left knee. Patient states he has been treating it at home with Tylenol . Patient agreeable to acute visit with available provider today.  Copied from CRM [id number]. Topic: Clinical - Red Word Triage >> Jun 13, 2023  9:15 AM Juluis Ok wrote: [facility] that prompted transfer to Nurse Triage: lt knee swelling Reason for Disposition  Knee swelling is a chronic symptom (recurrent or ongoing AND present > 4 weeks)  Answer Assessment - Initial Assessment Questions 1. LOCATION: "Where is the swelling located?"  (e.g., left, right, both knees)     Left knee.  2. ONSET: "When did the swelling start?" "Does it come and go, or is it there all the time?"     About a month or two, constant. All the time.  3. SWELLING: "How bad is the swelling?" Or, "How large is it?" (e.g., mild, moderate, severe; size of localized swelling)    - NONE: No joint swelling.   - LOCALIZED: Localized; small area of puffy or swollen skin (e.g., insect bite, skin irritation).   - MILD: Joint looks or feels mildly swollen or puffy.   - MODERATE: Swollen; interferes with normal activities (e.g., work or school); can't move joint normally (bend and straighten completely); may be limping.   - SEVERE: Very swollen; can't move swollen joint at all; limping a lot or unable to walk.     Mild.  4. PAIN: "Is there any pain?" If Yes, ask: "How bad is it?" (Scale 1-10; or mild,  moderate, severe)   - NONE (0): no pain.   - MILD (1-3): doesn't interfere with normal activities.    - MODERATE (4-7): interferes with normal activities (e.g., work or school) or awakens from sleep, limping.    - SEVERE (8-10): excruciating pain, unable to do any normal activities, unable to walk.      Yes, comes and goes. 8-10/10 when walking on it. Denies pain at this time while sitting down.  5. SETTING: "Has there been any recent work, exercise or other activity that involved that part of the body?"      Denies.  6. AGGRAVATING FACTORS: "What makes the knee swelling worse?" (e.g., walking, climbing stairs, running)     Bending or walking.  7. ASSOCIATED SYMPTOMS: "Is there any pain or redness?"     Pain with movement.  8. OTHER SYMPTOMS: "Do you have any other symptoms?" (e.g., chest pain, difficulty breathing, fever, calf pain)     Denies.  9. PREGNANCY: "Is there any chance you are pregnant?" "When was your last menstrual period?"     N/A.  Protocols used: Knee Swelling-A-AH

## 2023-06-13 NOTE — Assessment & Plan Note (Signed)
 Referring back to Ortho.  Oxycodone  as needed for severe pain.

## 2023-06-14 NOTE — Telephone Encounter (Signed)
 Patient informed and verbalized understanding of plan.

## 2023-06-22 ENCOUNTER — Other Ambulatory Visit (INDEPENDENT_AMBULATORY_CARE_PROVIDER_SITE_OTHER): Payer: Self-pay

## 2023-06-22 ENCOUNTER — Ambulatory Visit: Admitting: Orthopaedic Surgery

## 2023-06-22 ENCOUNTER — Encounter: Payer: Self-pay | Admitting: Orthopaedic Surgery

## 2023-06-22 VITALS — BP 142/84 | Ht 71.0 in | Wt 197.0 lb

## 2023-06-22 DIAGNOSIS — M1712 Unilateral primary osteoarthritis, left knee: Secondary | ICD-10-CM

## 2023-06-22 DIAGNOSIS — M25562 Pain in left knee: Secondary | ICD-10-CM | POA: Diagnosis not present

## 2023-06-22 DIAGNOSIS — G8929 Other chronic pain: Secondary | ICD-10-CM

## 2023-06-22 MED ORDER — METHYLPREDNISOLONE ACETATE 40 MG/ML IJ SUSP
40.0000 mg | Freq: Once | INTRAMUSCULAR | Status: AC
  Administered 2023-06-22: 40 mg via INTRA_ARTICULAR

## 2023-06-22 NOTE — Patient Instructions (Signed)
 Follow up in 3 weeks with [facility].

## 2023-06-22 NOTE — Addendum Note (Signed)
 Addended by: [place] Snow T on: 06/22/2023 10:13 AM   Modules accepted: Orders

## 2023-06-22 NOTE — Progress Notes (Signed)
 My left knee hurts now.  [name] post right total knee by Dr. Phyllis Breeze.  He has done well with that.  He has begun to have more pain in the left knee with swelling, popping.  He has occasional giving way.  He has no new trauma.  He has seen his family doctor about this and I have reviewed the notes. He has been on pain medicine, Percocet 5 which helps.  Left knee has effusion, crepitus, ROM 0 to 110, medial joint line pain, weakly positive medial [place], NV intact, no distal edema.  Gait is good.  X-rays were done of the left knee, reported separately.  Encounter Diagnosis  Name Primary?   Chronic pain of left knee Yes   PROCEDURE NOTE:  The patient requests injections of the left knee , verbal consent was obtained.  The left knee was prepped appropriately after time out was performed.   Sterile technique was observed and injection of 1 cc of DepoMedrol 40 mg with several cc's of plain xylocaine . Anesthesia was provided by ethyl chloride and a 20-gauge needle was used to inject the knee area. The injection was tolerated well.  A band aid dressing was applied.  The patient was advised to apply ice later today and tomorrow to the injection sight as needed.  Resume his NSAIDs.  Return in three weeks.  Call if any problem.  Precautions discussed.  Electronically Signed Pleasant Brilliant, MD 5/21/20258:43 AM

## 2023-07-04 ENCOUNTER — Ambulatory Visit: Admitting: Orthopedic Surgery

## 2023-07-08 ENCOUNTER — Encounter: Payer: Self-pay | Admitting: Cardiovascular Disease

## 2023-07-08 ENCOUNTER — Ambulatory Visit: Payer: Medicare HMO | Attending: Cardiovascular Disease | Admitting: Cardiovascular Disease

## 2023-07-08 VITALS — BP 138/88 | HR 98 | Ht 71.0 in | Wt 194.0 lb

## 2023-07-08 DIAGNOSIS — I480 Paroxysmal atrial fibrillation: Secondary | ICD-10-CM | POA: Diagnosis not present

## 2023-07-08 NOTE — Progress Notes (Signed)
 Electrophysiology Office Note:    Date:  07/08/2023   ID:  AMIT LEECE, DOB 1960-12-15, MRN [id number]  PCP:  Bennet Brasil, MD   [facility] Providers Cardiologist:  Armida Lander, MD Electrophysiologist:  Efraim Grange, MD     Referring MD: Bennet Brasil, MD   History of Present Illness:    Christopher [name] is a 63 y.o. male with a medical history significant for atrial fibrillation, hypertension, hyperlipidemia, referred for arrhythmia management.     I discussed the use of AI scribe software for clinical note transcription with the patient, who gave verbal consent to proceed.  He was diagnosed with atrial fibrillation (AFib) after a knee surgery in 2019. The patient was managed with a beta-blocker but discontinued it in 2021. The patient was not on anticoagulation due to a low CHA2DS2-VASc score. The patient was admitted to the hospital in June 2024 for episodes of shoulder pain, during which EKG showed AFib and flutter with controlled rates. The patient missed a follow-up visit with cardiology in July. The patient was recently found to have a 24% burden of AFib on a 14-day Zio monitor. The patient reports feeling palpitations and "like a wet dog" at times.  The patient does not recall hitting the button on the Zio monitor to indicate symptoms.  He underwent atrial fibrillation ablation on March 22, 2023.  Prior to the ablation, he would have episodes of rapid palpitations and chest pain.  He has not had these since the ablation.     Today, he reports that he feels well and is at baseline.  EKGs/Labs/Other Studies Reviewed Today:     Echocardiogram:  TTE January 2019 EF 60 to 65% normal valve structure   Monitors:  14-day ZIO --  my interpretation Sinus rhythm 40-171 beats minute, average 73 28% burden of atrial fibrillation , 40 to 217 bpm, average 94 bpm 2 pauses of about 3 seconds occurred in the a.m. and were not associated with  symptoms Patient triggered episodes were associated with atrial fibrillation     EKG:   EKG Interpretation Date/Time:  Friday July 08 2023 14:37:32 EDT Ventricular Rate:  98 PR Interval:  170 QRS Duration:  84 QT Interval:  328 QTC Calculation: 418 R Axis:   76  Text Interpretation: Normal sinus rhythm Anterior infarct (cited on or before 08-Jul-2023) When compared with ECG of 19-Apr-2023 11:09, No significant change was found Confirmed by Marlane Silver [id number]) on 07/08/2023 2:48:23 PM     Physical Exam:    VS:  BP 138/88 (BP Location: Left Arm)   Pulse 98   Ht 5\' 11"  (1.803 m)   Wt 194 lb (88 kg)   SpO2 98%   BMI 27.06 kg/m     Wt Readings from Last 3 Encounters:  07/08/23 194 lb (88 kg)  06/22/23 197 lb (89.4 kg)  06/13/23 197 lb (89.4 kg)     GEN: Well nourished, well developed in no acute distress CARDIAC: RRR, no murmurs, rubs, gallops RESPIRATORY:  Normal work of breathing MUSCULOSKELETAL: no edema    ASSESSMENT & PLAN:     Paroxysmal atrial fibrillation Occasional symptoms of lightheadedness, dizziness possibly due to extreme RVR Rates are occasionally very rapid Status post atrial fibrillation ablation on March 22, 2023 No symptoms of recurrence since ablation  Secondary hypercoagulable state CHA2DS2-VASc score is 1 for hypertension With low Chads2Vasc and no ecidence of AF recurrence, the risk of anticoagulation outweighs the benefit I have exhorted  him to call us  if her has recurrence of AF symptoms.      Signed, Efraim Grange, MD  07/08/2023 2:49 PM    [facility]

## 2023-07-08 NOTE — Patient Instructions (Signed)

## 2023-07-13 ENCOUNTER — Ambulatory Visit: Admitting: Orthopaedic Surgery

## 2023-07-18 ENCOUNTER — Other Ambulatory Visit: Payer: Self-pay | Admitting: Family Medicine

## 2023-08-05 ENCOUNTER — Other Ambulatory Visit: Payer: Self-pay

## 2023-08-05 ENCOUNTER — Emergency Department (HOSPITAL_COMMUNITY)

## 2023-08-05 ENCOUNTER — Emergency Department (HOSPITAL_COMMUNITY)
Admission: EM | Admit: 2023-08-05 | Discharge: 2023-08-05 | Disposition: A | Discharge status: Home / Self Care | Attending: Emergency Medicine | Admitting: Emergency Medicine

## 2023-08-05 ENCOUNTER — Encounter (HOSPITAL_COMMUNITY): Payer: Self-pay

## 2023-08-05 DIAGNOSIS — M25511 Pain in right shoulder: Secondary | ICD-10-CM | POA: Diagnosis not present

## 2023-08-05 DIAGNOSIS — I10 Essential (primary) hypertension: Secondary | ICD-10-CM | POA: Diagnosis not present

## 2023-08-05 DIAGNOSIS — M5030 Other cervical disc degeneration, unspecified cervical region: Secondary | ICD-10-CM | POA: Diagnosis not present

## 2023-08-05 DIAGNOSIS — M19011 Primary osteoarthritis, right shoulder: Secondary | ICD-10-CM | POA: Diagnosis not present

## 2023-08-05 DIAGNOSIS — D170 Benign lipomatous neoplasm of skin and subcutaneous tissue of head, face and neck: Secondary | ICD-10-CM

## 2023-08-05 DIAGNOSIS — M25571 Pain in right ankle and joints of right foot: Secondary | ICD-10-CM | POA: Diagnosis not present

## 2023-08-05 DIAGNOSIS — M542 Cervicalgia: Secondary | ICD-10-CM | POA: Diagnosis not present

## 2023-08-05 DIAGNOSIS — M79603 Pain in arm, unspecified: Secondary | ICD-10-CM | POA: Diagnosis not present

## 2023-08-05 DIAGNOSIS — R221 Localized swelling, mass and lump, neck: Secondary | ICD-10-CM | POA: Diagnosis not present

## 2023-08-05 DIAGNOSIS — M47819 Spondylosis without myelopathy or radiculopathy, site unspecified: Secondary | ICD-10-CM | POA: Diagnosis not present

## 2023-08-05 DIAGNOSIS — M79601 Pain in right arm: Secondary | ICD-10-CM | POA: Diagnosis not present

## 2023-08-05 LAB — CBC WITH DIFFERENTIAL/PLATELET
Abs Immature Granulocytes: 0.01 K/uL (ref 0.00–0.07)
Basophils Absolute: 0 K/uL (ref 0.0–0.1)
Basophils Relative: 1 %
Eosinophils Absolute: 0.1 K/uL (ref 0.0–0.5)
Eosinophils Relative: 2 %
HCT: 43.7 % (ref 39.0–52.0)
Hemoglobin: 15.7 g/dL (ref 13.0–17.0)
Immature Granulocytes: 0 %
Lymphocytes Relative: 29 %
Lymphs Abs: 1.3 K/uL (ref 0.7–4.0)
MCH: 30.8 pg (ref 26.0–34.0)
MCHC: 35.9 g/dL (ref 30.0–36.0)
MCV: 85.9 fL (ref 80.0–100.0)
Monocytes Absolute: 0.5 K/uL (ref 0.1–1.0)
Monocytes Relative: 12 %
Neutro Abs: 2.5 K/uL (ref 1.7–7.7)
Neutrophils Relative %: 56 %
Platelets: 246 K/uL (ref 150–400)
RBC: 5.09 MIL/uL (ref 4.22–5.81)
RDW: 12.4 % (ref 11.5–15.5)
WBC: 4.5 K/uL (ref 4.0–10.5)
nRBC: 0 % (ref 0.0–0.2)

## 2023-08-05 LAB — BASIC METABOLIC PANEL WITH GFR
Anion gap: 8 (ref 5–15)
BUN: 15 mg/dL (ref 8–23)
CO2: 29 mmol/L (ref 22–32)
Calcium: 8.9 mg/dL (ref 8.9–10.3)
Chloride: 103 mmol/L (ref 98–111)
Creatinine, Ser: 1.05 mg/dL (ref 0.61–1.24)
GFR, Estimated: >60 mL/min (ref >60)
Glucose, Bld: 124 mg/dL — ABNORMAL HIGH (ref 70–99)
Potassium: 3.4 mmol/L — ABNORMAL LOW (ref 3.5–5.1)
Sodium: 140 mmol/L (ref 135–145)

## 2023-08-05 LAB — TROPONIN I (HIGH SENSITIVITY): Troponin I (High Sensitivity): 4 ng/L (ref <18)

## 2023-08-05 MED ORDER — PREDNISONE 10 MG PO TABS
40.0000 mg | ORAL_TABLET | Freq: Every day | ORAL | 0 refills | Status: AC
Start: 2023-08-05 — End: 2023-08-10

## 2023-08-05 MED ORDER — IOHEXOL 300 MG/ML  SOLN
75.0000 mL | Freq: Once | INTRAMUSCULAR | Status: AC | PRN
  Administered 2023-08-05: 75 mL via INTRAVENOUS

## 2023-08-05 MED ORDER — METHOCARBAMOL 750 MG PO TABS: 750.0000 mg | ORAL_TABLET | Freq: Three times a day (TID) | ORAL | 0 refills | Status: DC

## 2023-08-05 NOTE — ED Provider Notes (Signed)
 [place] EMERGENCY DEPARTMENT AT [facility] Provider Note   CSN: [id number] Arrival date & time: 08/05/23  9075     Patient presents with: Right Shoulder Pain   Christopher [name] is a 63 y.o. male.   Patient is a 63 year old male who presents to the emergency department with a chief complaint of right shoulder pain which has been ongoing for about the past 3 to 4 weeks.  Patient notes that he did previously see his primary care doctor for this but has not seen his orthopedic provider regarding the pain.  He does note that last night he began having a sharp stabbing pain to his right shoulder that felt like a shock going down his right arm.  He notes he did have difficulty with moving his right arm at that time but notes that this has since resolved.  Patient notes that the pain does radiate up into the right side of his neck.  He denies any recent falls or blunt trauma.  He denies any recent heavy lifting.  He denies any previous injuries or surgeries to the affected shoulder.  He notes that he has had no associated chest pain or shortness of breath.  He denies any abnormal headaches, dizziness, lightheadedness or syncope.        Prior to Admission medications   Medication Sig Start Date End Date Taking? Authorizing Provider  amLODipine  (NORVASC ) 10 MG tablet TAKE 1 TABLET BY MOUTH EVERY DAY 07/18/23   Alphonsa Glendia LABOR, MD  famotidine  (PEPCID ) 40 MG tablet TAKE 1 TABLET(40 MG) BY MOUTH DAILY 07/18/23   Alphonsa Glendia LABOR, MD  oxyCODONE -acetaminophen  (PERCOCET/ROXICET) 5-325 MG tablet Take 1 tablet by mouth every 8 (eight) hours as needed for severe pain (pain score 7-10). 06/13/23   Cook, Jayce G, DO  rosuvastatin  (CRESTOR ) 20 MG tablet TAKE 1 TABLET(20 MG) BY MOUTH DAILY 01/25/23   Alphonsa Glendia LABOR, MD    Allergies: Patient has no active allergies.    Review of Systems  Musculoskeletal:        Right shoulder pain  All other systems reviewed and are negative.   Updated Vital  Signs BP (!) 147/90 (BP Location: Left Arm)   Pulse 71   Temp 98 F (36.7 C) (Oral)   Resp 20   Ht 5' 11 (1.803 m)   Wt 90.7 kg   SpO2 94%   BMI 27.89 kg/m   Physical Exam Vitals and nursing note reviewed.  Constitutional:      Appearance: Normal appearance.  HENT:     Head: Normocephalic and atraumatic.     Nose: Nose normal.     Mouth/Throat:     Mouth: Mucous membranes are moist.  Eyes:     Extraocular Movements: Extraocular movements intact.     Conjunctiva/sclera: Conjunctivae normal.     Pupils: Pupils are equal, round, and reactive to light.  Neck:     Comments: Tenderness palpation along the right trapezius muscle Cardiovascular:     Rate and Rhythm: Normal rate and regular rhythm.     Pulses: Normal pulses.     Heart sounds: Normal heart sounds. No murmur heard.    No gallop.  Pulmonary:     Effort: Pulmonary effort is normal. No respiratory distress.     Breath sounds: Normal breath sounds. No stridor. No wheezing, rhonchi or rales.  Chest:     Chest wall: No tenderness.  Musculoskeletal:        General: Normal range of motion.  Cervical back: Normal range of motion and neck supple.     Comments: Tenderness palpation along the anterior aspect of the right shoulder, nontender palpation of the right elbow, wrist, hand, radial pulses are 2+ distally, sensation intact distally, cap refill less than 2 seconds distally, radial, ulnar, median, axillary nerve function intact distally, no obvious deformity or bruising, no skin breakdown or ulceration, no lacerations or abrasions  Skin:    General: Skin is warm and dry.  Neurological:     General: No focal deficit present.     Mental Status: [name] alert and oriented to person, place, and time. Mental status is at baseline.     Cranial Nerves: No cranial nerve deficit.     Sensory: No sensory deficit.     Motor: No weakness.     Coordination: Coordination normal.     Gait: Gait normal.  Psychiatric:        Mood  and Affect: Mood normal.        Behavior: Behavior normal.        Thought Content: Thought content normal.        Judgment: Judgment normal.     (all labs ordered are listed, but only abnormal results are displayed) Labs Reviewed - No data to display  EKG: None  Radiology: No results found.   Procedures   Medications Ordered in the ED - No data to display  Clinical Course as of 08/05/23 1331  Fri Aug 05, 2023  1023 On reevaluation patient does have a localized area of swelling along the right superior lateral aspect of the neck.  This does appear to be most consistent with lipoma but will obtain further imaging as this may be the source of the patient's pain. [CR]    Clinical Course User Index [CR] Daralene Lonni BIRCH, PA-C                                 Medical Decision Making Amount and/or Complexity of Data Reviewed Labs: ordered. Radiology: ordered.  Risk Prescription drug management.   This patient presents to the ED for concern of right-sided neck and shoulder pain differential diagnosis includes muscle spasm, fracture, sprain, vertebral aneurysm or dissection, lipoma, abscess, cellulitis, radiculopathy    Additional history obtained:  Additional history obtained from family External records from outside source obtained and reviewed including medical records   Lab Tests:  I Ordered, and personally interpreted labs.  The pertinent results include: No leukocytosis, no anemia, normal kidney function, unremarkable electrolytes, normal troponin   Imaging Studies ordered:  I ordered imaging studies including x-ray of cervical spine, right shoulder, CT scan of neck I independently visualized and interpreted imaging which showed no acute osseous injury or lesions, localized area of swelling to the right posterior aspect of the neck I agree with the radiologist interpretation   Problem List / ED Course:  Patient is doing well at this time and is stable  for discharge home.  Discussed with patient the localized area of swelling to the right side of the neck does appear to be secondary with a lipoma.  There is no clinical indication for cellulitis or abscess summation at this point.  Do suspect that the symptoms have been experiencing is secondary to radiculopathy and will treat accordingly on outpatient basis.  Will recommend close follow-up with general surgery for continued evaluation of the area of swelling.  Will also recommend close follow-up  with his primary care doctor.  Patient already has an orthopedic surgeon that he follows up with on an outpatient basis.  He was directed to follow-up with them as well for continued pain in his shoulder.  Do not suspect ACS at this time as troponin is within normal limits and EKG has no acute ischemic changes.  He has no clinical indication for line etiology such as septic joint or gout.  Strict turn precautions were provided for any new or worsening symptoms.  Patient voiced understanding and had no additional questions.  Patient case was fully discussed with attending physician who is in agreement to plan at this time.   Social Determinants of Health:  None        Final diagnoses:  None    ED Discharge Orders     None          Daralene Lonni JONETTA DEVONNA 08/05/23 1334    Charlyn Sora, MD 08/05/23 1346

## 2023-08-05 NOTE — ED Triage Notes (Signed)
 Pt states his right shoulder has been hurting for about 3-4 weeks. States that last night he felt a shock go through his arm and that is why he wants to have it checked out today. No falls as stated per pt, no heavy lifting, etc. Hx of a.fib, HTN, and recently was told to stop taking his blood thinner. Not diabetic.

## 2023-08-05 NOTE — Discharge Instructions (Signed)
 Please follow-up closely with your primary care doctor and general surgery on an outpatient basis for reevaluation of the area of swelling to the back of your neck.  Return to emergency department immediately for any new or worsening symptoms.

## 2023-08-08 ENCOUNTER — Encounter: Payer: Self-pay | Admitting: Physician Assistant

## 2023-08-08 ENCOUNTER — Ambulatory Visit: Payer: Self-pay

## 2023-08-08 ENCOUNTER — Ambulatory Visit: Admitting: Physician Assistant

## 2023-08-08 VITALS — BP 138/80 | HR 87 | Temp 98.2°F | Ht 71.0 in | Wt 198.0 lb

## 2023-08-08 DIAGNOSIS — G8929 Other chronic pain: Secondary | ICD-10-CM | POA: Diagnosis not present

## 2023-08-08 DIAGNOSIS — M25511 Pain in right shoulder: Secondary | ICD-10-CM

## 2023-08-08 MED ORDER — GABAPENTIN 100 MG PO CAPS: 100.0000 mg | ORAL_CAPSULE | Freq: Three times a day (TID) | ORAL | 1 refills | Status: DC

## 2023-08-08 NOTE — Assessment & Plan Note (Addendum)
 Patient presents today with chronic right shoulder pain. Recent imaging from ER visit revealed significant degenerative changes, no acute fractures or dislocations. Generalized right shoulder tenderess today, decreased ROM with overhead abduction, he is neurovascularly intact. Advised patient to finish steroid burst and continue meloxicam  daily. Will trial gabapentin  100 mg tid for nerve related pain. Advised patient to follow up with orthopedics as previously referred and discussed today. Warning signs and ER precautions discussed.

## 2023-08-08 NOTE — Telephone Encounter (Signed)
 FYI Only or Action Required?: FYI only for provider.  Patient was last seen in primary care on 06/13/2023 by Cook, Jayce G, DO. Called Nurse Triage reporting Shoulder Pain. Symptoms began yesterday. Interventions attempted: OTC medications: Tylenol , Ibuprofen . Symptoms are: unchanged.  Triage Disposition: See PCP When Office is Open (Within 3 Days)  Patient/caregiver understands and will follow disposition?: Yes    Copied from CRM [id number]. Topic: Clinical - Red Word Triage >> Aug 08, 2023  9:59 AM Tiffini S wrote: [facility] that prompted transfer to Nurse Triage: Patient have questions about his lab results and is asking to talk with a nurse with concerns. Patient is having severe pain in the right shoulder. Transfer call to triage nurse. Reason for Disposition  [1] MODERATE pain (e.g., interferes with normal activities) AND [2] present > 3 days  Answer Assessment - Initial Assessment Questions 1. ONSET: When did the pain start?     Last Night  2. LOCATION: Where is the pain located?     Right Shoulder  3. PAIN: How bad is the pain? (Scale 1-10; or mild, moderate, severe)   - MILD (1-3): doesn't interfere with normal activities   - MODERATE (4-7): interferes with normal activities (e.g., work or school) or awakens from sleep   - SEVERE (8-10): excruciating pain, unable to do any normal activities, unable to move arm at all due to pain     6  4. WORK OR EXERCISE: Has there been any recent work or exercise that involved this part of the body?     Unsure  5. CAUSE: What do you think is causing the shoulder pain?     Unsure  6. OTHER SYMPTOMS: Do you have any other symptoms? (e.g., neck pain, swelling, rash, fever, numbness, weakness)  Swelling, Numbness, Weakness  Feels like a bolt a lightning  Protocols used: Shoulder Pain-A-AH

## 2023-08-08 NOTE — Progress Notes (Signed)
   Acute Office Visit  Subjective:     Patient ID: Christopher [name], male    DOB: [birth year], 63 y.o.   MRN: [id number]   Patient presents today for ER follow up regarding chronic right shoulder pain. He states lengthy history of right shoulder pain, but recently has been experiencing lightning bolt pain from his shoulder to his fingers, prompting his ER visit. At that time imaging was performed and patient treated with muscle relaxer and steroid burst. Today, patient continues to experience intermittent neuropathic type pain from his shoulder to his fingers. He denies worsening pain, numbness, or decreased strength. He endorses decreased range of motion with overhead abduction. He has previously been referred to orthopedics, however has yet to make an appointment.     Review of Systems  Constitutional:  Negative for chills, fever, malaise/fatigue and weight loss.  Musculoskeletal:  Positive for joint pain. Negative for back pain, falls, myalgias and neck pain.  Neurological:  Negative for tingling, tremors, sensory change and weakness.        Objective:     BP 138/80   Pulse 87   Temp 98.2 F (36.8 C)   Ht 5' 11 (1.803 m)   Wt 198 lb (89.8 kg)   SpO2 100%   BMI 27.62 kg/m   Physical Exam Constitutional:      Appearance: Normal appearance.  HENT:     Head: Normocephalic.     Mouth/Throat:     Mouth: Mucous membranes are moist.     Pharynx: Oropharynx is clear.  Eyes:     Extraocular Movements: Extraocular movements intact.     Conjunctiva/sclera: Conjunctivae normal.  Cardiovascular:     Rate and Rhythm: Normal rate and regular rhythm.     Heart sounds: Normal heart sounds. No murmur heard. Pulmonary:     Effort: Pulmonary effort is normal.     Breath sounds: Normal breath sounds. No wheezing or rales.  Musculoskeletal:     Right shoulder: Tenderness and bony tenderness present. No swelling, deformity, effusion, laceration or crepitus. Decreased range of motion.   Skin:    General: Skin is warm and dry.  Neurological:     General: No focal deficit present.     Mental Status: [name] alert and oriented to person, place, and time.  Psychiatric:        Mood and Affect: Mood normal.        Behavior: Behavior normal.     No results found for any visits on 08/08/23.      Assessment & Plan:  Chronic right shoulder pain Assessment & Plan: Patient presents today with chronic right shoulder pain. Recent imaging from ER visit revealed significant degenerative changes, no acute fractures or dislocations. Generalized right shoulder tenderess today, decreased ROM with overhead abduction, he is neurovascularly intact. Advised patient to finish steroid burst and continue meloxicam  daily. Will trial gabapentin  100 mg tid for nerve related pain. Advised patient to follow up with orthopedics as previously referred and discussed today. Warning signs and ER precautions discussed.   Orders: -     Gabapentin ; Take 1 capsule (100 mg total) by mouth 3 (three) times daily.  Dispense: 90 capsule; Refill: 1    Return if symptoms worsen or fail to improve.  Charmaine Dilraj Killgore, PA-C

## 2023-08-15 ENCOUNTER — Telehealth: Payer: Self-pay

## 2023-08-15 NOTE — Telephone Encounter (Signed)
 Patient was recently seen by Charmaine The symptoms he relates to her does sound like intermittent nerve impingement Nerve conduction with EMG could be helpful He has options Option #1 we can set this up through neurology and they can pursue doing this test Option #2 we could set him in with a orthopedic group that also has the ability to do nerve conduction studies then they could assess this further with nerve conduction and possibly having to do intervention-more than likely this is related to the spine and not so much the shoulder but doing either 1 of these options would help define where the problem is so it could be addressed Please have patient decide which path he would like to go thank you

## 2023-08-15 NOTE — Telephone Encounter (Signed)
 Spoke with patient , he does relate that he can go with the neurologist referral for his severe burning pain that comes at times

## 2023-08-15 NOTE — Telephone Encounter (Signed)
 Nurses-Go ahead with neurology referral [facility]-please have referral coordinator make sure that they have the ability to do nerve conduction studies because if they do not a referral there is pointless-please keep me in the loop thank you

## 2023-08-15 NOTE — Telephone Encounter (Signed)
 Copied from CRM [id number]. Topic: Clinical - Medical Advice >> Aug 15, 2023  8:32 AM Myrick T wrote: Reason for CRM: patient called to see if he could get a nerve conduction test for the pain in his right shoulder. Please f/u with patient

## 2023-08-16 ENCOUNTER — Telehealth: Payer: Self-pay | Admitting: Family Medicine

## 2023-08-16 NOTE — Telephone Encounter (Signed)
 Please inform patient that neurology group that does nerve conduction studies is very far booked out therefore we recommend [facility] [place] please go ahead with referral Neck and shoulder pain possible nerve impingement

## 2023-08-16 NOTE — Telephone Encounter (Unsigned)
 Copied from CRM [id number]. Topic: Clinical - Medication Refill >> Aug 16, 2023 11:13 AM [name] wrote: Medication: oxyCODONE -acetaminophen  (PERCOCET/ROXICET) 5-325 MG tablet  Has the patient contacted their pharmacy? No   This is the patient's preferred pharmacy:  [id number] - [place], [place] - [address] SEC [address]  [place] [address] Phone: [phone] Fax: [phone]  Is this the correct pharmacy for this prescription? Yes If no, delete pharmacy and type the correct one.   Has the prescription been filled recently? Yes  Is the patient out of the medication? Yes he said he has been out as he has a hard time sleeping at night  Has the patient been seen for an appointment in the last year OR does the patient have an upcoming appointment? Yes  Can we respond through MyChart? Yes  Please assist patient further

## 2023-08-17 ENCOUNTER — Other Ambulatory Visit: Payer: Self-pay

## 2023-08-17 DIAGNOSIS — M7541 Impingement syndrome of right shoulder: Secondary | ICD-10-CM

## 2023-08-17 NOTE — Telephone Encounter (Signed)
 Spoke with patient and advised according to doctor's notes, pt is agreeable to proceed with orthopedic referral for nerve conduction testing .

## 2023-08-22 ENCOUNTER — Emergency Department (HOSPITAL_COMMUNITY)
Admission: EM | Admit: 2023-08-22 | Discharge: 2023-08-22 | Disposition: A | Discharge status: Home / Self Care | Attending: Emergency Medicine | Admitting: Emergency Medicine

## 2023-08-22 ENCOUNTER — Other Ambulatory Visit: Payer: Self-pay

## 2023-08-22 ENCOUNTER — Encounter (HOSPITAL_COMMUNITY): Payer: Self-pay

## 2023-08-22 DIAGNOSIS — M25511 Pain in right shoulder: Secondary | ICD-10-CM | POA: Insufficient documentation

## 2023-08-22 DIAGNOSIS — I10 Essential (primary) hypertension: Secondary | ICD-10-CM | POA: Diagnosis not present

## 2023-08-22 DIAGNOSIS — M542 Cervicalgia: Secondary | ICD-10-CM | POA: Diagnosis not present

## 2023-08-22 DIAGNOSIS — R221 Localized swelling, mass and lump, neck: Secondary | ICD-10-CM | POA: Insufficient documentation

## 2023-08-22 DIAGNOSIS — G8929 Other chronic pain: Secondary | ICD-10-CM | POA: Diagnosis not present

## 2023-08-22 MED ORDER — GABAPENTIN 300 MG PO CAPS: 300.0000 mg | ORAL_CAPSULE | Freq: Three times a day (TID) | ORAL | 0 refills | Status: DC

## 2023-08-22 MED ORDER — LIDOCAINE 5 % EX PTCH: 1.0000 | MEDICATED_PATCH | CUTANEOUS | 0 refills | Status: DC

## 2023-08-22 MED ORDER — GABAPENTIN 300 MG PO CAPS
300.0000 mg | ORAL_CAPSULE | Freq: Once | ORAL | Status: AC
  Administered 2023-08-22: 300 mg via ORAL
  Filled 2023-08-22: qty 1

## 2023-08-22 NOTE — ED Triage Notes (Signed)
 Patient from home for R side neck aching that travels down his arm and is causing a headache. Symptoms started a couple of weeks ago; has been seen before for same and told it was nerve related. Reports the pain is getting worse. Upon arrival to ER, patient is alert and oriented, ambu

## 2023-08-22 NOTE — Discharge Instructions (Signed)
 You were evaluated in the Emergency Department and after careful evaluation, we did not find any emergent condition requiring admission or further testing in the hospital.  Your exam/testing today is overall reassuring.  Recommend increasing your gabapentin  to 300 mg 3 times daily.  Recommend also using the Lidoderm  patches daily.  Continue Tylenol  or Motrin  for discomfort.  Follow-up with the ENT specialists.  Also recommend follow-up with the orthopedic specialists.  Please return to the Emergency Department if you experience any worsening of your condition.   Thank you for allowing us  to be a part of your care.

## 2023-08-22 NOTE — ED Provider Notes (Signed)
 [facility] Emergency Department Provider Note MRN:  [id number]  Arrival date & time: 08/22/23     Chief Complaint   Shoulder pain History of Present Illness   Christopher [name] is a 63 y.o. year-old male with a history of hypertension, A-fib presenting to the ED with chief complaint of shoulder pain.  Pain to the right shoulder and right trapezius that he attributes to nerve pain.  He has a nodule on his right posterior neck and he has been told that it may be interfering with the nerves and causing pain to the right side of his neck, trapezius, right shoulder.  Much worse than normal tonight.  Denies numbness or weakness to the arms or legs, no bowel or bladder dysfunction, no trauma.  Has had the nodule for years, has had the pain for months and months, not getting much help.  Review of Systems  A thorough review of systems was obtained and all systems are negative except as noted in the HPI and PMH.   Patient's Health History    Past Medical History:  Diagnosis Date   Arthritis    Atrial fibrillation (HCC)    a. occuring in the post-operative setting in 02/2017   GERD (gastroesophageal reflux disease)    HTN (hypertension)    Hyperlipemia     Past Surgical History:  Procedure Laterality Date   ATRIAL FIBRILLATION ABLATION N/A 03/22/2023   Procedure: ATRIAL FIBRILLATION ABLATION;  Surgeon: Nancey Eulas BRAVO, MD;  [facility];  Service: Cardiovascular;  Laterality: N/A;   COLONOSCOPY N/A 07/13/2018   Procedure: COLONOSCOPY;  Surgeon: Golda Claudis PENNER, MD;  Location: [facility];  Service: Endoscopy;  Laterality: N/A;  830   EXAM UNDER ANESTHESIA WITH MANIPULATION OF KNEE Right 04/05/2017   Procedure: EXAM UNDER ANESTHESIA WITH MANIPULATION OF KNEE;  Surgeon: Margrette Taft BRAVO, MD;  Location: [facility] ORS;  Service: Orthopedics;  Laterality: Right;   HERNIA REPAIR Right    KNEE ARTHROSCOPY  right knee   POLYPECTOMY  07/13/2018   Procedure:  POLYPECTOMY;  Surgeon: Golda Claudis PENNER, MD;  Location[facility];  Service: Endoscopy;;   TOTAL KNEE ARTHROPLASTY Right 02/15/2017   Procedure: TOTAL KNEE ARTHROPLASTY;  Surgeon: Margrette Taft BRAVO, MD;  Location: [facility] ORS;  Service: Orthopedics;  Laterality: Right;    Family History  Problem Relation Age of Onset   Arthritis Other    Cancer Other    Diabetes Other    Other Mother 35       sepsis   CAD Father 58   CAD Brother 15   Diabetes Brother    Colon cancer Neg Hx     Social History   Socioeconomic History   Marital status: Married    Spouse name: Tammy   Number of children: 2   Years of education: 12   Highest education level: Not on file  Occupational History   Occupation: truck Air traffic controller: [employer]  Tobacco Use   Smoking status: Never   Smokeless tobacco: Never   Tobacco comments:    Never smoked 04/19/23  Vaping Use   Vaping status: Never Used  Substance and Sexual Activity   Alcohol use: Not Currently    Comment: occasional beer   Drug use: No   Sexual activity: Yes    Birth control/protection: None  Other Topics Concern   Not on file  Social History Narrative   1 son and 1 daughter   3  grandchildren   Social Drivers of Corporate investment banker Strain: Low Risk  (12/17/2022)   Overall Financial Resource Strain (CARDIA)    Difficulty of Paying Living Expenses: Not hard at all  Food Insecurity: No Food Insecurity (12/17/2022)   Hunger Vital Sign    Worried About Running Out of Food in the Last Year: Never true    Ran Out of Food in the Last Year: Never true  Transportation Needs: No Transportation Needs (12/17/2022)   PRAPARE - Administrator, Civil Service (Medical): No    Lack of Transportation (Non-Medical): No  Physical Activity: Insufficiently Active (12/17/2022)   Exercise Vital Sign    Days of Exercise per Week: 3 days    Minutes of Exercise per Session: 30 min  Stress: No Stress Concern Present (12/17/2022)    [employer] of Occupational Health - Occupational Stress Questionnaire    Feeling of Stress : Not at all  Social Connections: Moderately Integrated (12/17/2022)   Social Connection and Isolation Panel    Frequency of Communication with Friends and Family: More than three times a week    Frequency of Social Gatherings with Friends and Family: Three times a week    Attends Religious Services: More than 4 times per year    Active Member of Clubs or Organizations: No    Attends Banker Meetings: Never    Marital Status: Married  Catering manager Violence: Not At Risk (12/17/2022)   Humiliation, Afraid, Rape, and Kick questionnaire    Fear of Current or Ex-Partner: No    Emotionally Abused: No    Physically Abused: No    Sexually Abused: No     Physical Exam   Vitals:   08/22/23 0319  BP: (!) 159/94  Pulse: 75  Resp: 15  Temp: 97.8 F (36.6 C)  SpO2: 95%    CONSTITUTIONAL: Well-appearing, NAD NEURO/PSYCH:  Alert and oriented x 3, no focal deficits EYES:  eyes equal and reactive ENT/NECK:  no LAD, no JVD CARDIO: Regular rate, well-perfused, normal S1 and S2 PULM:  CTAB no wheezing or rhonchi GI/GU:  non-distended, non-tender MSK/SPINE:  No gross deformities, no edema SKIN:  no rash, atraumatic   *Additional and/or pertinent findings included in MDM below  Diagnostic and Interventional Summary    EKG Interpretation Date/Time:    Ventricular Rate:    PR Interval:    QRS Duration:    QT Interval:    QTC Calculation:   R Axis:      Text Interpretation:         Labs Reviewed - No data to display  No orders to display    Medications  gabapentin  (NEURONTIN ) capsule 300 mg (300 mg Oral Given 08/22/23 0351)     Procedures  /  Critical Care Procedures  ED Course and Medical Decision Making  Initial Impression and Ddx Nodule to the right occiput, tender to the right trapezius, right deltoid.  Neurovascularly intact.  Seems like an acute on  chronic problem, has had multiple modes of imaging.  I see no real indication for repeat imaging at this time.  Has a lot of room to go up on his gabapentin , needs to follow-up with a specialist.  Nothing to suggest emergent process.  Past medical/surgical history that increases complexity of ED encounter: None  Interpretation of Diagnostics Laboratory and/or imaging options to aid in the diagnosis/care of the patient were considered.  After careful history and physical examination, it was determined that  there was no indication for diagnostics at this time.  Patient Reassessment and Ultimate Disposition/Management     Discharge  Patient management required discussion with the following services or consulting groups:  None  Complexity of Problems Addressed Acute complicated illness or Injury  Additional Data Reviewed and Analyzed Further history obtained from: Further history from spouse/family member  Additional Factors Impacting ED Encounter Risk Prescriptions  Ozell HERO. Theadore, MD [facility] Emergency Medicine [facility] [email or web address]  Final Clinical Impressions(s) / ED Diagnoses     ICD-10-CM   1. Nodule of neck  R22.1     2. Chronic right shoulder pain  M25.511    G89.29       ED Discharge Orders          Ordered    gabapentin  (NEURONTIN ) 300 MG capsule  3 times daily        08/22/23 0400    lidocaine  (LIDODERM ) 5 %  Every 24 hours        08/22/23 0400             Discharge Instructions Discussed with and Provided to Patient:    Discharge Instructions      You were evaluated in the Emergency Department and after careful evaluation, we did not find any emergent condition requiring admission or further testing in the hospital.  Your exam/testing today is overall reassuring.  Recommend increasing your gabapentin  to 300 mg 3 times daily.  Recommend also using the Lidoderm  patches daily.  Continue Tylenol  or Motrin  for discomfort.   Follow-up with the ENT specialists.  Also recommend follow-up with the orthopedic specialists.  Please return to the Emergency Department if you experience any worsening of your condition.   Thank you for allowing us  to be a part of your care.      Theadore Ozell HERO, MD 08/22/23 [phone]

## 2023-08-25 DIAGNOSIS — M7912 Myalgia of auxiliary muscles, head and neck: Secondary | ICD-10-CM | POA: Diagnosis not present

## 2023-08-25 DIAGNOSIS — M542 Cervicalgia: Secondary | ICD-10-CM | POA: Diagnosis not present

## 2023-08-25 DIAGNOSIS — M25511 Pain in right shoulder: Secondary | ICD-10-CM | POA: Diagnosis not present

## 2023-08-30 ENCOUNTER — Other Ambulatory Visit (HOSPITAL_COMMUNITY): Payer: Self-pay | Admitting: Otolaryngology

## 2023-08-30 DIAGNOSIS — M542 Cervicalgia: Secondary | ICD-10-CM

## 2023-09-06 ENCOUNTER — Other Ambulatory Visit: Payer: Self-pay | Admitting: Family Medicine

## 2023-09-12 ENCOUNTER — Ambulatory Visit (HOSPITAL_COMMUNITY)
Admission: RE | Admit: 2023-09-12 | Discharge: 2023-09-12 | Disposition: A | Discharge status: Home / Self Care | Source: Ambulatory Visit | Attending: Otolaryngology | Admitting: Otolaryngology

## 2023-09-12 ENCOUNTER — Other Ambulatory Visit (HOSPITAL_COMMUNITY): Payer: Self-pay | Admitting: Otolaryngology

## 2023-09-12 ENCOUNTER — Encounter (HOSPITAL_COMMUNITY): Payer: Self-pay

## 2023-09-12 ENCOUNTER — Encounter: Payer: Self-pay | Admitting: Cardiovascular Disease

## 2023-09-12 ENCOUNTER — Inpatient Hospital Stay (HOSPITAL_COMMUNITY): Admission: RE | Admit: 2023-09-12 | Source: Ambulatory Visit

## 2023-09-12 DIAGNOSIS — M542 Cervicalgia: Secondary | ICD-10-CM

## 2023-09-12 DIAGNOSIS — Z5309 Procedure and treatment not carried out because of other contraindication: Secondary | ICD-10-CM

## 2023-09-15 ENCOUNTER — Ambulatory Visit (HOSPITAL_COMMUNITY)
Admission: RE | Admit: 2023-09-15 | Discharge: 2023-09-15 | Disposition: A | Discharge status: Home / Self Care | Source: Ambulatory Visit | Attending: Otolaryngology | Admitting: Otolaryngology

## 2023-09-15 DIAGNOSIS — Z5309 Procedure and treatment not carried out because of other contraindication: Secondary | ICD-10-CM

## 2023-09-15 DIAGNOSIS — M542 Cervicalgia: Secondary | ICD-10-CM | POA: Insufficient documentation

## 2023-09-15 DIAGNOSIS — Z1389 Encounter for screening for other disorder: Secondary | ICD-10-CM | POA: Diagnosis not present

## 2023-09-15 DIAGNOSIS — M5020 Other cervical disc displacement, unspecified cervical region: Secondary | ICD-10-CM | POA: Diagnosis not present

## 2023-09-15 DIAGNOSIS — M47812 Spondylosis without myelopathy or radiculopathy, cervical region: Secondary | ICD-10-CM | POA: Diagnosis not present

## 2023-09-15 DIAGNOSIS — M4802 Spinal stenosis, cervical region: Secondary | ICD-10-CM | POA: Diagnosis not present

## 2023-09-19 ENCOUNTER — Encounter (INDEPENDENT_AMBULATORY_CARE_PROVIDER_SITE_OTHER): Payer: Self-pay | Admitting: Otolaryngology

## 2023-09-19 ENCOUNTER — Ambulatory Visit (INDEPENDENT_AMBULATORY_CARE_PROVIDER_SITE_OTHER): Admitting: Otolaryngology

## 2023-09-19 VITALS — BP 164/99 | HR 84

## 2023-09-19 DIAGNOSIS — R221 Localized swelling, mass and lump, neck: Secondary | ICD-10-CM

## 2023-09-19 NOTE — Progress Notes (Signed)
 ENT CONSULT:  Reason for Consult: posterior neck mass  HPI: Discussed the use of AI scribe software for clinical note transcription with the patient, who gave verbal consent to proceed.  History of Present Illness Christopher [name] is a 63 year old male who presents with a mass in the back of his neck R head.  He first noticed the mass over a year ago, and it was also note during an ER visit for shoulder pain. Over the past six to eight months, the mass has increased in size and occasionally causes pain, prompting him to pull his neck to the side.  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  He denies current use of blood thinners, although he was previously on them around the time of a cardiac procedure (ablation). He underwent an ablation for atrial fibrillation approximately eight months ago, during which he was temporarily placed on blood thinners.   Records Reviewed:  ED  visit note 08/22/23 Christopher [name] is a 63 y.o. year-old male with a history of hypertension, A-fib presenting to the ED with chief complaint of shoulder pain.   Pain to the right shoulder and right trapezius that he attributes to nerve pain.  He has a nodule on his right posterior neck and he has been told that it may be interfering with the nerves and causing pain to the right side of his neck, trapezius, right shoulder.  Much worse than normal tonight.  Denies numbness or weakness to the arms or legs, no bowel or bladder dysfunction, no trauma.  Has had the nodule for years, has had the pain for months and months, not getting much help.   Nodule to the right occiput, tender to the right trapezius, right deltoid.  Neurovascularly intact.  Seems like an acute on chronic problem, has had multiple modes of imaging.  I see no real indication for repeat imaging at this time.   Has a lot of room to go up on his gabapentin , needs to follow-up with a specialist.  Nothing to suggest emergent process.   Past medical/surgical history that increases complexity of ED encounter: None   Past Medical History:  Diagnosis Date   Arthritis    Atrial fibrillation (HCC)    a. occuring in the post-operative setting in 02/2017   GERD (gastroesophageal reflux disease)    HTN (hypertension)    Hyperlipemia     Past Surgical History:  Procedure Laterality Date   ATRIAL FIBRILLATION ABLATION N/A 03/22/2023   Procedure: ATRIAL FIBRILLATION ABLATION;  Surgeon: Nancey Eulas BRAVO, MD;  [facility];  Service: Cardiovascular;  Laterality: N/A;   COLONOSCOPY N/A 07/13/2018   Procedure: COLONOSCOPY;  Surgeon: Golda Claudis PENNER, MD;  [facility];  Service: Endoscopy;  Laterality: N/A;  830   EXAM UNDER ANESTHESIA WITH MANIPULATION OF KNEE Right 04/05/2017   Procedure: EXAM UNDER ANESTHESIA WITH MANIPULATION OF KNEE;  Surgeon: Margrette Taft BRAVO, MD;  Location: [facility];  Service: Orthopedics;  Laterality: Right;   HERNIA REPAIR Right    KNEE ARTHROSCOPY  right knee   POLYPECTOMY  07/13/2018   Procedure: POLYPECTOMY;  Surgeon: Golda Claudis PENNER, MD;  [facility];  Service: Endoscopy;;   TOTAL KNEE ARTHROPLASTY Right 02/15/2017   Procedure: TOTAL KNEE ARTHROPLASTY;  Surgeon: Margrette Taft BRAVO, MD;  Location: [facility];  Service: Orthopedics;  Laterality: Right;    Family History  Problem Relation Age of Onset   Arthritis Other    Cancer Other    Diabetes Other    Other Mother 72       sepsis   CAD Father 64   CAD Brother 35   Diabetes Brother    Colon cancer Neg Hx     Social History:  reports that he has never smoked. He has never used smokeless tobacco. He reports that he does not currently use alcohol. He reports that he does not use drugs.  Allergies: No Known Allergies  Medications: I have reviewed the patient's current  medications.  The PMH, PSH, Medications, Allergies, and SH were reviewed and updated.  ROS: Constitutional: Negative for fever, weight loss and weight gain. Cardiovascular: Negative for chest pain and dyspnea on exertion. Respiratory: Is not experiencing shortness of breath at rest. Gastrointestinal: Negative for nausea and vomiting. Neurological: Negative for headaches. Psychiatric: The patient is not nervous/anxious  Blood pressure (!) 164/99, pulse 84, SpO2 95%. There is no height or weight on file to calculate BMI.  PHYSICAL EXAM:  Exam: General: Well-developed, well-nourished Communication and Voice: Clear pitch and clarity Respiratory Respiratory effort: Equal inspiration and expiration without stridor Cardiovascular Peripheral Vascular: Warm extremities with equal color/perfusion Eyes: No nystagmus with equal extraocular motion bilaterally Neuro/Psych/Balance: Patient oriented to person, place, and time; Appropriate mood and affect; Gait is intact with no imbalance; Cranial nerves I-XII are intact Head and Face Inspection: Normocephalic and atraumatic without mass or lesion Palpation: Facial skeleton intact without bony stepoffs Salivary Glands: No mass or tenderness Facial Strength: Facial motility symmetric and full bilaterally ENT Pinna: External ear intact and fully developed External canal: Canal is patent with intact skin Tympanic Membrane: Clear and mobile External Nose: No scar or anatomic deformity Lips, Teeth, and gums: Mucosa and teeth intact and viable TMJ: No pain to palpation with full mobility Oral cavity/oropharynx: No erythema or exudate, no lesions present Neck Neck and Trachea: Midline trachea without mass or lesion Thyroid : No mass or nodularity Lymphatics: No lymphadenopathy  Right posterior occiput neck with 6-7 cm soft mass in subcutaneous plane, no tenderness, no overlying skin changes    Studies Reviewed: CT neck w/con  08/12/2023 FINDINGS: Pharynx and larynx: The nasopharynx is symmetric. The tonsils are symmetric. The palate, oral cavity, floor of mouth, base of tongue, epiglottis, and retropharynx are unremarkable. Symmetric appearance of the aryepiglottic folds and piriform sinuses. Normal appearance of the paraglottic fat. Vocal folds are symmetric. There is no mass, swelling, or normal enhancement along the visualized aero digestive structures in the neck.   Salivary glands: No inflammation, mass, or stone.   Thyroid : Normal.   Lymph nodes: There are no pathologically enlarged lymph nodes in the neck.   Vascular: Mild atherosclerosis along the proximal aspect of the cervical ICAs without evidence of high-grade stenosis.   Limited intracranial: Limited visualization of intracranial structures without focal acute abnormality.   Visualized orbits: The orbits are unremarkable.   Mastoids and visualized paranasal sinuses: Mucosal thickening in the alveolar recess of the left maxillary sinus. No air-fluid levels. Mastoid air cells are clear.   Skeleton: No acute or aggressive finding. Degenerative changes in the visualized spine.   Upper chest: Visualized lung apices without acute abnormality.   Other: There is stranding within the subcutaneous fat along the posterior aspect of the right shoulder and right scapula. Visualized underlying musculature without focal abnormality.   IMPRESSION: No acute abnormality along the  aero digestive structures in the neck.   No cervical lymphadenopathy.   Stranding within the subcutaneous tissues along the posterior aspect of the right shoulder and right scapula.    Assessment/Plan: Encounter Diagnosis  Name Primary?   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 He reports discomfort and it is gradually  increasing in size. Risks and benefits of removal were discussed with the patient and he would like to proceed.  - Schedule for surgery  - if drain is placed, post-op in 2 days and if not, post-op in 2 weeks    Thank you for allowing me to participate in the care of this patient. Please do not hesitate to contact me with any questions or concerns.   Elena Larry, MD Otolaryngology [facility] Phone: [phone] Fax: [phone]    09/19/2023, 9:17 AM

## 2023-09-19 NOTE — H&P (View-Only) (Signed)
 ENT CONSULT:  Reason for Consult: posterior neck mass  HPI: Discussed the use of AI scribe software for clinical note transcription with the patient, who gave verbal consent to proceed.  History of Present Illness Christopher [name] is a 63 year old male who presents with a mass in the back of his neck R head.  He first noticed the mass over a year ago, and it was also note during an ER visit for shoulder pain. Over the past six to eight months, the mass has increased in size and occasionally causes pain, prompting him to pull his neck to the side.  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  He denies current use of blood thinners, although he was previously on them around the time of a cardiac procedure (ablation). He underwent an ablation for atrial fibrillation approximately eight months ago, during which he was temporarily placed on blood thinners.   Records Reviewed:  ED  visit note 08/22/23 Christopher [name] is a 63 y.o. year-old male with a history of hypertension, A-fib presenting to the ED with chief complaint of shoulder pain.   Pain to the right shoulder and right trapezius that he attributes to nerve pain.  He has a nodule on his right posterior neck and he has been told that it may be interfering with the nerves and causing pain to the right side of his neck, trapezius, right shoulder.  Much worse than normal tonight.  Denies numbness or weakness to the arms or legs, no bowel or bladder dysfunction, no trauma.  Has had the nodule for years, has had the pain for months and months, not getting much help.   Nodule to the right occiput, tender to the right trapezius, right deltoid.  Neurovascularly intact.  Seems like an acute on chronic problem, has had multiple modes of imaging.  I see no real indication for repeat imaging at this time.   Has a lot of room to go up on his gabapentin , needs to follow-up with a specialist.  Nothing to suggest emergent process.   Past medical/surgical history that increases complexity of ED encounter: None   Past Medical History:  Diagnosis Date   Arthritis    Atrial fibrillation (HCC)    a. occuring in the post-operative setting in 02/2017   GERD (gastroesophageal reflux disease)    HTN (hypertension)    Hyperlipemia     Past Surgical History:  Procedure Laterality Date   ATRIAL FIBRILLATION ABLATION N/A 03/22/2023   Procedure: ATRIAL FIBRILLATION ABLATION;  Surgeon: Nancey Eulas BRAVO, MD;  Location: [facility];  Service: Cardiovascular;  Laterality: N/A;   COLONOSCOPY N/A 07/13/2018   Procedure: COLONOSCOPY;  Surgeon: Golda Claudis PENNER, MD;  Location: [facility];  Service: Endoscopy;  Laterality: N/A;  830   EXAM UNDER ANESTHESIA WITH MANIPULATION OF KNEE Right 04/05/2017   Procedure: EXAM UNDER ANESTHESIA WITH MANIPULATION OF KNEE;  Surgeon: Margrette Taft BRAVO, MD;  Location: [facility];  Service: Orthopedics;  Laterality: Right;   HERNIA REPAIR Right    KNEE ARTHROSCOPY  right knee   POLYPECTOMY  07/13/2018   Procedure: POLYPECTOMY;  Surgeon: Golda Claudis PENNER, MD;  Location: [facility];  Service: Endoscopy;;   TOTAL KNEE ARTHROPLASTY Right 02/15/2017   Procedure: TOTAL KNEE ARTHROPLASTY;  Surgeon: Margrette Taft BRAVO, MD;  Location: [facility];  Service: Orthopedics;  Laterality: Right;    Family History  Problem Relation Age of Onset   Arthritis Other    Cancer Other    Diabetes Other    Other Mother 72       sepsis   CAD Father 64   CAD Brother 35   Diabetes Brother    Colon cancer Neg Hx     Social History:  reports that he has never smoked. He has never used smokeless tobacco. He reports that he does not currently use alcohol. He reports that he does not use drugs.  Allergies: No Known Allergies  Medications: I have reviewed the patient's current  medications.  The PMH, PSH, Medications, Allergies, and SH were reviewed and updated.  ROS: Constitutional: Negative for fever, weight loss and weight gain. Cardiovascular: Negative for chest pain and dyspnea on exertion. Respiratory: Is not experiencing shortness of breath at rest. Gastrointestinal: Negative for nausea and vomiting. Neurological: Negative for headaches. Psychiatric: The patient is not nervous/anxious  Blood pressure (!) 164/99, pulse 84, SpO2 95%. There is no height or weight on file to calculate BMI.  PHYSICAL EXAM:  Exam: General: Well-developed, well-nourished Communication and Voice: Clear pitch and clarity Respiratory Respiratory effort: Equal inspiration and expiration without stridor Cardiovascular Peripheral Vascular: Warm extremities with equal color/perfusion Eyes: No nystagmus with equal extraocular motion bilaterally Neuro/Psych/Balance: Patient oriented to person, place, and time; Appropriate mood and affect; Gait is intact with no imbalance; Cranial nerves I-XII are intact Head and Face Inspection: Normocephalic and atraumatic without mass or lesion Palpation: Facial skeleton intact without bony stepoffs Salivary Glands: No mass or tenderness Facial Strength: Facial motility symmetric and full bilaterally ENT Pinna: External ear intact and fully developed External canal: Canal is patent with intact skin Tympanic Membrane: Clear and mobile External Nose: No scar or anatomic deformity Lips, Teeth, and gums: Mucosa and teeth intact and viable TMJ: No pain to palpation with full mobility Oral cavity/oropharynx: No erythema or exudate, no lesions present Neck Neck and Trachea: Midline trachea without mass or lesion Thyroid : No mass or nodularity Lymphatics: No lymphadenopathy  Right posterior occiput neck with 6-7 cm soft mass in subcutaneous plane, no tenderness, no overlying skin changes    Studies Reviewed: CT neck w/con  08/12/2023 FINDINGS: Pharynx and larynx: The nasopharynx is symmetric. The tonsils are symmetric. The palate, oral cavity, floor of mouth, base of tongue, epiglottis, and retropharynx are unremarkable. Symmetric appearance of the aryepiglottic folds and piriform sinuses. Normal appearance of the paraglottic fat. Vocal folds are symmetric. There is no mass, swelling, or normal enhancement along the visualized aero digestive structures in the neck.   Salivary glands: No inflammation, mass, or stone.   Thyroid : Normal.   Lymph nodes: There are no pathologically enlarged lymph nodes in the neck.   Vascular: Mild atherosclerosis along the proximal aspect of the cervical ICAs without evidence of high-grade stenosis.   Limited intracranial: Limited visualization of intracranial structures without focal acute abnormality.   Visualized orbits: The orbits are unremarkable.   Mastoids and visualized paranasal sinuses: Mucosal thickening in the alveolar recess of the left maxillary sinus. No air-fluid levels. Mastoid air cells are clear.   Skeleton: No acute or aggressive finding. Degenerative changes in the visualized spine.   Upper chest: Visualized lung apices without acute abnormality.   Other: There is stranding within the subcutaneous fat along the posterior aspect of the right shoulder and right scapula. Visualized underlying musculature without focal abnormality.   IMPRESSION: No acute abnormality along the  aero digestive structures in the neck.   No cervical lymphadenopathy.   Stranding within the subcutaneous tissues along the posterior aspect of the right shoulder and right scapula.    Assessment/Plan: Encounter Diagnosis  Name Primary?   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 He reports discomfort and it is gradually  increasing in size. Risks and benefits of removal were discussed with the patient and he would like to proceed.  - Schedule for surgery  - if drain is placed, post-op in 2 days and if not, post-op in 2 weeks    Thank you for allowing me to participate in the care of this patient. Please do not hesitate to contact me with any questions or concerns.   Elena Larry, MD Otolaryngology [facility] Phone: [phone] Fax: [phone]    09/19/2023, 9:17 AM

## 2023-09-19 NOTE — Patient Instructions (Signed)
 SABRA

## 2023-09-20 ENCOUNTER — Encounter (HOSPITAL_BASED_OUTPATIENT_CLINIC_OR_DEPARTMENT_OTHER): Payer: Self-pay | Admitting: *Deleted

## 2023-09-20 ENCOUNTER — Other Ambulatory Visit: Payer: Self-pay

## 2023-09-23 ENCOUNTER — Encounter: Payer: Self-pay | Admitting: Radiology

## 2023-09-25 NOTE — Anesthesia Preprocedure Evaluation (Signed)
 Anesthesia Evaluation  Patient identified by MRN, date of birth, ID band Patient awake    Reviewed: Allergy & Precautions, NPO status , Patient's Chart, lab work & pertinent test results  History of Anesthesia Complications Negative for: history of anesthetic complications  Airway Mallampati: II  TM Distance: >3 FB Neck ROM: Full    Dental  (+) Missing,    Pulmonary neg pulmonary ROS   Pulmonary exam normal        Cardiovascular hypertension, Pt. on medications Normal cardiovascular exam+ dysrhythmias Atrial Fibrillation      Neuro/Psych negative neurological ROS     GI/Hepatic Neg liver ROS,GERD  Medicated and Controlled,,  Endo/Other  negative endocrine ROS    Renal/GU negative Renal ROS  negative genitourinary   Musculoskeletal  (+) Arthritis ,    Abdominal   Peds  Hematology negative hematology ROS (+)   Anesthesia Other Findings Posterior neck mass  Reproductive/Obstetrics                              Anesthesia Physical Anesthesia Plan  ASA: 2  Anesthesia Plan: General   Post-op Pain Management: Tylenol  PO (pre-op)*   Induction: Intravenous  PONV Risk Score and Plan: 2 and Treatment may vary due to age or medical condition, Ondansetron , Dexamethasone  and Midazolam   Airway Management Planned: Oral ETT  Additional Equipment: None  Intra-op Plan:   Post-operative Plan: Extubation in OR  Informed Consent: I have reviewed the patients History and Physical, chart, labs and discussed the procedure including the risks, benefits and alternatives for the proposed anesthesia with the patient or authorized representative who has indicated his/her understanding and acceptance.     Dental advisory given  Plan Discussed with: CRNA  Anesthesia Plan Comments:          Anesthesia Quick Evaluation

## 2023-09-26 ENCOUNTER — Other Ambulatory Visit: Payer: Self-pay

## 2023-09-26 ENCOUNTER — Ambulatory Visit (HOSPITAL_BASED_OUTPATIENT_CLINIC_OR_DEPARTMENT_OTHER): Payer: Self-pay | Admitting: Anesthesiology

## 2023-09-26 ENCOUNTER — Encounter (HOSPITAL_BASED_OUTPATIENT_CLINIC_OR_DEPARTMENT_OTHER): Payer: Self-pay

## 2023-09-26 ENCOUNTER — Encounter (HOSPITAL_BASED_OUTPATIENT_CLINIC_OR_DEPARTMENT_OTHER)
Admission: RE | Disposition: A | Discharge status: Home / Self Care | Payer: Self-pay | Source: Home / Self Care | Attending: Otolaryngology

## 2023-09-26 ENCOUNTER — Ambulatory Visit (HOSPITAL_BASED_OUTPATIENT_CLINIC_OR_DEPARTMENT_OTHER)
Admission: RE | Admit: 2023-09-26 | Discharge: 2023-09-26 | Disposition: A | Discharge status: Home / Self Care | Attending: Otolaryngology | Admitting: Otolaryngology

## 2023-09-26 ENCOUNTER — Encounter (HOSPITAL_BASED_OUTPATIENT_CLINIC_OR_DEPARTMENT_OTHER): Payer: Self-pay | Admitting: Anesthesiology

## 2023-09-26 DIAGNOSIS — E785 Hyperlipidemia, unspecified: Secondary | ICD-10-CM | POA: Insufficient documentation

## 2023-09-26 DIAGNOSIS — D170 Benign lipomatous neoplasm of skin and subcutaneous tissue of head, face and neck: Secondary | ICD-10-CM | POA: Diagnosis not present

## 2023-09-26 DIAGNOSIS — R221 Localized swelling, mass and lump, neck: Secondary | ICD-10-CM | POA: Diagnosis not present

## 2023-09-26 DIAGNOSIS — M1990 Unspecified osteoarthritis, unspecified site: Secondary | ICD-10-CM | POA: Diagnosis not present

## 2023-09-26 DIAGNOSIS — K219 Gastro-esophageal reflux disease without esophagitis: Secondary | ICD-10-CM | POA: Insufficient documentation

## 2023-09-26 DIAGNOSIS — Z01818 Encounter for other preprocedural examination: Secondary | ICD-10-CM

## 2023-09-26 DIAGNOSIS — I4891 Unspecified atrial fibrillation: Secondary | ICD-10-CM | POA: Insufficient documentation

## 2023-09-26 DIAGNOSIS — I10 Essential (primary) hypertension: Secondary | ICD-10-CM | POA: Insufficient documentation

## 2023-09-26 HISTORY — PX: EXCISION MASS NECK: SHX6703

## 2023-09-26 SURGERY — EXCISION, MASS, NECK
Anesthesia: General | Site: Neck | Laterality: Right

## 2023-09-26 MED ORDER — ROCURONIUM 10MG/ML (10ML) SYRINGE FOR MEDFUSION PUMP - OPTIME
INTRAVENOUS | Status: DC | PRN
  Administered 2023-09-26: 50 mg via INTRAVENOUS

## 2023-09-26 MED ORDER — OXYCODONE HCL 5 MG PO TABS: 5.0000 mg | ORAL_TABLET | Freq: Three times a day (TID) | ORAL | 0 refills | Status: DC | PRN

## 2023-09-26 MED ORDER — DEXAMETHASONE SODIUM PHOSPHATE 10 MG/ML IJ SOLN
INTRAMUSCULAR | Status: AC
  Filled 2023-09-26: qty 1

## 2023-09-26 MED ORDER — CEFAZOLIN SODIUM-DEXTROSE 2-3 GM-%(50ML) IV SOLR
INTRAVENOUS | Status: DC | PRN
Start: 2023-09-26 — End: 2023-09-26
  Administered 2023-09-26: 2 g via INTRAVENOUS

## 2023-09-26 MED ORDER — PROPOFOL 10 MG/ML IV BOLUS
INTRAVENOUS | Status: DC | PRN
  Administered 2023-09-26: 150 mg via INTRAVENOUS

## 2023-09-26 MED ORDER — OXYCODONE HCL 5 MG/5ML PO SOLN: 5.0000 mg | Freq: Once | ORAL | Status: AC | PRN

## 2023-09-26 MED ORDER — FENTANYL CITRATE (PF) 100 MCG/2ML IJ SOLN: 25.0000 ug | INTRAMUSCULAR | Status: DC | PRN

## 2023-09-26 MED ORDER — LIDOCAINE 2% (20 MG/ML) 5 ML SYRINGE
INTRAMUSCULAR | Status: DC | PRN
  Administered 2023-09-26: 80 mg via INTRAVENOUS

## 2023-09-26 MED ORDER — AMOXICILLIN-POT CLAVULANATE 875-125 MG PO TABS: 1.0000 | ORAL_TABLET | Freq: Two times a day (BID) | ORAL | 0 refills | Status: AC

## 2023-09-26 MED ORDER — ACETAMINOPHEN 500 MG PO TABS
1000.0000 mg | ORAL_TABLET | Freq: Once | ORAL | Status: AC
  Administered 2023-09-26: 1000 mg via ORAL

## 2023-09-26 MED ORDER — SUGAMMADEX SODIUM 200 MG/2ML IV SOLN
INTRAVENOUS | Status: DC | PRN
  Administered 2023-09-26: 350 mg via INTRAVENOUS

## 2023-09-26 MED ORDER — ACETAMINOPHEN 500 MG PO TABS: 500.0000 mg | ORAL_TABLET | Freq: Four times a day (QID) | ORAL | 0 refills | Status: AC

## 2023-09-26 MED ORDER — ROCURONIUM BROMIDE 10 MG/ML (PF) SYRINGE
PREFILLED_SYRINGE | INTRAVENOUS | Status: AC
  Filled 2023-09-26: qty 10

## 2023-09-26 MED ORDER — IBUPROFEN 600 MG PO TABS: 600.0000 mg | ORAL_TABLET | Freq: Four times a day (QID) | ORAL | 0 refills | Status: AC

## 2023-09-26 MED ORDER — ONDANSETRON HCL 4 MG/2ML IJ SOLN
INTRAMUSCULAR | Status: AC
  Filled 2023-09-26: qty 2

## 2023-09-26 MED ORDER — FENTANYL CITRATE (PF) 100 MCG/2ML IJ SOLN
INTRAMUSCULAR | Status: AC
  Filled 2023-09-26: qty 2

## 2023-09-26 MED ORDER — OXYCODONE HCL 5 MG PO TABS
ORAL_TABLET | ORAL | Status: AC
  Filled 2023-09-26: qty 1

## 2023-09-26 MED ORDER — BACITRACIN 500 UNIT/GM EX OINT
TOPICAL_OINTMENT | CUTANEOUS | Status: DC | PRN
  Administered 2023-09-26: 1 via TOPICAL

## 2023-09-26 MED ORDER — DEXAMETHASONE SODIUM PHOSPHATE 4 MG/ML IJ SOLN
INTRAMUSCULAR | Status: DC | PRN
  Administered 2023-09-26: 5 mg via INTRAVENOUS

## 2023-09-26 MED ORDER — PROPOFOL 10 MG/ML IV BOLUS
INTRAVENOUS | Status: AC
  Filled 2023-09-26: qty 20

## 2023-09-26 MED ORDER — ONDANSETRON HCL 4 MG/2ML IJ SOLN
INTRAMUSCULAR | Status: DC | PRN
  Administered 2023-09-26: 4 mg via INTRAVENOUS

## 2023-09-26 MED ORDER — PHENYLEPHRINE HCL-NACL 20-0.9 MG/250ML-% IV SOLN
INTRAVENOUS | Status: DC | PRN
Start: 2023-09-26 — End: 2023-09-26
  Administered 2023-09-26: 160 ug via INTRAVENOUS
  Administered 2023-09-26: 80 ug via INTRAVENOUS
  Administered 2023-09-26: 120 ug via INTRAVENOUS
  Administered 2023-09-26: 80 ug via INTRAVENOUS
  Administered 2023-09-26: 120 ug via INTRAVENOUS
  Administered 2023-09-26: 160 ug via INTRAVENOUS
  Administered 2023-09-26: 120 ug via INTRAVENOUS

## 2023-09-26 MED ORDER — LIDOCAINE-EPINEPHRINE 1 %-1:100000 IJ SOLN
INTRAMUSCULAR | Status: DC | PRN
  Administered 2023-09-26: 4 mL

## 2023-09-26 MED ORDER — LACTATED RINGERS IV SOLN: INTRAVENOUS | Status: DC

## 2023-09-26 MED ORDER — FENTANYL CITRATE (PF) 100 MCG/2ML IJ SOLN
INTRAMUSCULAR | Status: DC | PRN
  Administered 2023-09-26: 100 ug via INTRAVENOUS

## 2023-09-26 MED ORDER — ACETAMINOPHEN 500 MG PO TABS
ORAL_TABLET | ORAL | Status: AC
  Filled 2023-09-26: qty 2

## 2023-09-26 MED ORDER — DROPERIDOL 2.5 MG/ML IJ SOLN: 0.6250 mg | Freq: Once | INTRAMUSCULAR | Status: DC | PRN

## 2023-09-26 MED ORDER — OXYCODONE HCL 5 MG PO TABS
5.0000 mg | ORAL_TABLET | Freq: Once | ORAL | Status: AC | PRN
  Administered 2023-09-26: 5 mg via ORAL

## 2023-09-26 MED ORDER — LIDOCAINE 2% (20 MG/ML) 5 ML SYRINGE
INTRAMUSCULAR | Status: AC
  Filled 2023-09-26: qty 10

## 2023-09-26 SURGICAL SUPPLY — 65 items
BAG DECANTER FOR FLEXI CONT (MISCELLANEOUS) IMPLANT
BAND RUBBER #18 3X1/16 STRL (MISCELLANEOUS) IMPLANT
BENZOIN TINCTURE PRP APPL 2/3 (GAUZE/BANDAGES/DRESSINGS) IMPLANT
BLADE CLIPPER SURG (BLADE) IMPLANT
BLADE SURG 15 STRL LF DISP TIS (BLADE) ×2 IMPLANT
CANISTER SUCT 1200ML W/VALVE (MISCELLANEOUS) ×2 IMPLANT
CLEANER CAUTERY TIP PAD (MISCELLANEOUS) IMPLANT
CLIP APPLIE 9.375 MED OPEN (MISCELLANEOUS) IMPLANT
CORD BIPOLAR FORCEPS 12FT (ELECTRODE) ×2 IMPLANT
COVER BACK TABLE 60X90IN (DRAPES) ×2 IMPLANT
COVER MAYO STAND STRL (DRAPES) ×2 IMPLANT
DERMABOND ADVANCED .7 DNX12 (GAUZE/BANDAGES/DRESSINGS) IMPLANT
DRAIN PENROSE 12X.25 LTX STRL (MISCELLANEOUS) IMPLANT
DRAPE INCISE IOBAN 66X45 STRL (DRAPES) IMPLANT
DRAPE U-SHAPE 76X120 STRL (DRAPES) ×2 IMPLANT
DRSG TEGADERM 2-3/8X2-3/4 SM (GAUZE/BANDAGES/DRESSINGS) IMPLANT
ELECT COATED BLADE 2.86 ST (ELECTRODE) IMPLANT
ELECT NDL TIP 2.8 STRL (NEEDLE) IMPLANT
ELECT NEEDLE TIP 2.8 STRL (NEEDLE) IMPLANT
ELECTRODE PAIRED SUBDERMAL (MISCELLANEOUS) IMPLANT
ELECTRODE REM PT RTRN 9FT ADLT (ELECTROSURGICAL) ×2 IMPLANT
FORCEPS BIPOLAR SPETZLER 8 1.0 (NEUROSURGERY SUPPLIES) ×2 IMPLANT
GAUZE 4X4 16PLY ~~LOC~~+RFID DBL (SPONGE) IMPLANT
GAUZE SPONGE 4X4 12PLY STRL (GAUZE/BANDAGES/DRESSINGS) IMPLANT
GAUZE SPONGE 4X4 12PLY STRL LF (GAUZE/BANDAGES/DRESSINGS) IMPLANT
GLOVE BIO SURGEON STRL SZ 6 (GLOVE) ×2 IMPLANT
GLOVE BIO SURGEON STRL SZ7.5 (GLOVE) IMPLANT
GLOVE BIOGEL PI IND STRL 7.0 (GLOVE) IMPLANT
GLOVE SURG SS PI 6.0 STRL IVOR (GLOVE) IMPLANT
GOWN STRL REUS W/ TWL LRG LVL3 (GOWN DISPOSABLE) ×4 IMPLANT
HEMOSTAT ARISTA ABSORB 3G PWDR (HEMOSTASIS) IMPLANT
NDL PRECISIONGLIDE 27X1.5 (NEEDLE) ×2 IMPLANT
NEEDLE PRECISIONGLIDE 27X1.5 (NEEDLE) ×1 IMPLANT
NS IRRIG 1000ML POUR BTL (IV SOLUTION) ×2 IMPLANT
PACK BASIN DAY SURGERY FS (CUSTOM PROCEDURE TRAY) ×2 IMPLANT
PENCIL SMOKE EVACUATOR (MISCELLANEOUS) ×2 IMPLANT
PROBE NERVBE PRASS .33 (MISCELLANEOUS) IMPLANT
SHEARS HARMONIC 9CM CVD (BLADE) IMPLANT
SHEET MEDIUM DRAPE 40X70 STRL (DRAPES) IMPLANT
SLEEVE SCD COMPRESS KNEE MED (STOCKING) ×2 IMPLANT
SPIKE FLUID TRANSFER (MISCELLANEOUS) IMPLANT
SPONGE INTESTINAL PEANUT (DISPOSABLE) ×2 IMPLANT
STAPLER SKIN PROX WIDE 3.9 (STAPLE) IMPLANT
STRIP CLOSURE SKIN 1/2X4 (GAUZE/BANDAGES/DRESSINGS) IMPLANT
STRIP CLOSURE SKIN 1/4X4 (GAUZE/BANDAGES/DRESSINGS) IMPLANT
SUCTION TUBE FRAZIER 10FR DISP (SUCTIONS) IMPLANT
SUT CHROMIC 4 0 RB 1X27 (SUTURE) IMPLANT
SUT MNCRL AB 4-0 PS2 18 (SUTURE) IMPLANT
SUT MON AB 4-0 PS1 27 (SUTURE) IMPLANT
SUT SILK 2 0 SH (SUTURE) IMPLANT
SUT SILK 3 0 REEL (SUTURE) ×2 IMPLANT
SUT SILK 4 0 TIES 17X18 (SUTURE) ×2 IMPLANT
SUT VIC AB 3-0 FS2 27 (SUTURE) IMPLANT
SUT VIC AB 3-0 SH 27X BRD (SUTURE) IMPLANT
SUT VIC AB 4-0 PS2 18 (SUTURE) IMPLANT
SWAB COLLECTION DEVICE MRSA (MISCELLANEOUS) IMPLANT
SWAB CULTURE ESWAB REG 1ML (MISCELLANEOUS) IMPLANT
SYR BULB EAR ULCER 3OZ GRN STR (SYRINGE) ×2 IMPLANT
SYR CONTROL 10ML LL (SYRINGE) ×2 IMPLANT
TAPE CLOTH 3X10 WHT NS LF (GAUZE/BANDAGES/DRESSINGS) ×2 IMPLANT
TAPE PAPER 3X10 WHT MICROPORE (GAUZE/BANDAGES/DRESSINGS) IMPLANT
TOWEL GREEN STERILE FF (TOWEL DISPOSABLE) ×2 IMPLANT
TRAY DSU PREP LF (CUSTOM PROCEDURE TRAY) ×2 IMPLANT
TUBE CONNECTING 20X1/4 (TUBING) ×2 IMPLANT
YANKAUER SUCT BULB TIP NO VENT (SUCTIONS) IMPLANT

## 2023-09-26 NOTE — Interval H&P Note (Signed)
 History and Physical Interval Note:  09/26/2023 9:15 AM  Christopher [name]  has presented today for surgery, with the diagnosis of NECK MASS.  The various methods of treatment have been discussed with the patient and family. After consideration of risks, benefits and other options for treatment, the patient has consented to  Procedure(s) with comments: EXCISION, MASS, NECK (Right) - excision of posterior neck lipoma as a surgical intervention.  The patient's history has been reviewed, patient examined, no change in status, stable for surgery.  I have reviewed the patient's chart and labs.  Questions were answered to the patient's satisfaction.     Jerlyn Pain

## 2023-09-26 NOTE — Anesthesia Procedure Notes (Signed)
 Procedure Name: Intubation Date/Time: 09/26/2023 10:00 AM  Performed by: Burnard Rosaline HERO, CRNAPre-anesthesia Checklist: Patient identified, Emergency Drugs available, Suction available and Patient being monitored Patient Re-evaluated:Patient Re-evaluated prior to induction Oxygen  Delivery Method: Circle system utilized Preoxygenation: Pre-oxygenation with 100% oxygen  Induction Type: IV induction Ventilation: Mask ventilation without difficulty Laryngoscope Size: Mac and 4 Grade View: Grade II Tube type: Oral Tube size: 7.5 mm Number of attempts: 1 Airway Equipment and Method: Stylet and Oral airway Placement Confirmation: ETT inserted through vocal cords under direct vision, positive ETCO2, breath sounds checked- equal and bilateral and CO2 detector Secured at: 23 cm Tube secured with: Tape Dental Injury: Teeth and Oropharynx as per pre-operative assessment

## 2023-09-26 NOTE — Transfer of Care (Signed)
 Immediate Anesthesia Transfer of Care Note  Patient: Christopher [name]  Procedure(s) Performed: EXCISION RIGHT POSTERIOR NECK MASS (Right: Neck)  Patient Location: PACU  Anesthesia Type:General  Level of Consciousness: awake and alert   Airway & Oxygen  Therapy: Patient Spontanous Breathing and Patient connected to face mask oxygen   Post-op Assessment: Report given to RN and Post -op Vital signs reviewed and stable  Post vital signs: Reviewed and stable  Last Vitals:  Vitals Value Taken Time  BP 181/100 09/26/23 11:04  Temp 36.2 C 09/26/23 11:04  Pulse 84 09/26/23 11:08  Resp 15 09/26/23 11:08  SpO2 96 % 09/26/23 11:08  Vitals shown include unfiled device data.  Last Pain:  Vitals:   09/26/23 0827  TempSrc: Temporal  PainSc: 0-No pain         Complications: No notable events documented.

## 2023-09-26 NOTE — Anesthesia Postprocedure Evaluation (Signed)
 Anesthesia Post Note  Patient: Christopher [name]  Procedure(s) Performed: EXCISION RIGHT POSTERIOR NECK MASS (Right: Neck)     Patient location during evaluation: PACU Anesthesia Type: General Level of consciousness: awake and alert Pain management: pain level controlled Vital Signs Assessment: post-procedure vital signs reviewed and stable Respiratory status: spontaneous breathing, nonlabored ventilation and respiratory function stable Cardiovascular status: blood pressure returned to baseline Postop Assessment: no apparent nausea or vomiting Anesthetic complications: no   No notable events documented.  Last Vitals:  Vitals:   09/26/23 1115 09/26/23 1125  BP: (!) 165/93   Pulse: 83 80  Resp: 17   Temp:  (!) 36.2 C  SpO2: 97% 98%    Last Pain:  Vitals:   09/26/23 1125  TempSrc: Tympanic  PainSc:                  Christopher [name]

## 2023-09-26 NOTE — Discharge Instructions (Addendum)
 No tylenol  until 2:30 p.m.  Post Anesthesia Home Care Instructions  Activity: Get plenty of rest for the remainder of the day. A responsible individual must stay with you for 24 hours following the procedure.  For the next 24 hours, DO NOT: -Drive a car -Advertising copywriter -Drink alcoholic beverages -Take any medication unless instructed by your physician -Make any legal decisions or sign important papers.  Meals: Start with liquid foods such as gelatin or soup. Progress to regular foods as tolerated. Avoid greasy, spicy, heavy foods. If nausea and/or vomiting occur, drink only clear liquids until the nausea and/or vomiting subsides. Call your physician if vomiting continues.  Special Instructions/Symptoms: Your throat may feel dry or sore from the anesthesia or the breathing tube placed in your throat during surgery. If this causes discomfort, gargle with warm salt water . The discomfort should disappear within 24 hours.  If you had a scopolamine patch placed behind your ear for the management of post- operative nausea and/or vomiting:  1. The medication in the patch is effective for 72 hours, after which it should be removed.  Wrap patch in a tissue and discard in the trash. Wash hands thoroughly with soap and water . 2. You may remove the patch earlier than 72 hours if you experience unpleasant side effects which may include dry mouth, dizziness or visual disturbances. 3. Avoid touching the patch. Wash your hands with soap and water  after contact with the patch.     Apply Vaseline along the incision daily to help with crusting Change gauze dressing after it gets soiled You have a drain we will remove 2 days after surgery Take all medications prescribed to you. We gave you a course of antibiotic to take until done. Take Tylenol  and Motrin  for pain every 6 hrs. If you still have pain, take Oxycodone .

## 2023-09-26 NOTE — Op Note (Signed)
 ENT PROCEDURE NOTE   DIAGNOSIS: 1. Right posterior neck mass   POSTOPERATIVE DIAGNOSIS: 1. Right posterior neck mass   PROCEDURE: 1. Excision of the right posterior neck mass   SURGEON: Chenay Nesmith     ASSISTANT: None   ANESTHESIA: General endotracheal   ESTIMATED BLOOD LOSS: less than 1 mL   COMPLICATIONS: None   CONDITION: Stable to PACU.   INTRAOPERATIVE FINDINGS: 1.Mass consistent with lipoma total size of 8 cm   SPECIMEN: 1. Right posterior neck mass   INDICATIONS AND CONSENT: 65 yoM with hx of  who presented to the Otolaryngology clinic with a history of gradually growing right posterior neck mass and pain. They were therefore offered the aforementioned procedures. The procedure, risks, benefits, alternatives, potential complications, possible outcomes as well as the option of no treatment were reviewed with the patient who indicated they understood and wished to proceed forward with the procedure. Informed consent was obtained.   DESCRIPTION OF PROCEDURE: On 09/26/2023, the patient was taken to the operating room, and general anesthesia was induced. After the patient was prepped and draped in standard fashion, a surgeon's time out was performed. We then outlined our planned incision and infiltrated with 1% lidocaine  with 1:100,000 epinephrine .    We first began by sharply excising through the overlying skin and dermis along our planned incision line. We proceeded to divide the dermis. We then dissected along the capsule of the mass in a circumferential manner using a combination of sharp dissection and cautery. We dissected down to the paraspinal muscles and continued to carry out the dissection until the entire mass was dissected and removed. All bleeding point were controlled using bovie electrocautery and a couple of small blood vessels along the paraspinal muscles were also clipped using automatic clip applier. Once the mass was dissected, it was sent for permanent  pathology. We once again checked hemostasis and there was evidence of good hemostasis. A 1/4 penrose was placed, and secured with 2-0 silk. The incision was then closed in a layer fashion using 3.0 Vycril for the deep layers, and 5-0 chromic gut suture to approximate skin.   This concluded our procedure. The patient tolerated the procedure well without any apparent immediate post operative complications. Wound care instructions and post-procedure instructions were given.    Jasean Ambrosia

## 2023-09-27 ENCOUNTER — Encounter (HOSPITAL_BASED_OUTPATIENT_CLINIC_OR_DEPARTMENT_OTHER): Payer: Self-pay | Admitting: Otolaryngology

## 2023-09-27 LAB — SURGICAL PATHOLOGY

## 2023-09-28 ENCOUNTER — Ambulatory Visit (INDEPENDENT_AMBULATORY_CARE_PROVIDER_SITE_OTHER): Admitting: Otolaryngology

## 2023-09-28 ENCOUNTER — Encounter (INDEPENDENT_AMBULATORY_CARE_PROVIDER_SITE_OTHER): Payer: Self-pay | Admitting: Otolaryngology

## 2023-09-28 VITALS — BP 150/86 | HR 68

## 2023-09-28 DIAGNOSIS — D170 Benign lipomatous neoplasm of skin and subcutaneous tissue of head, face and neck: Secondary | ICD-10-CM

## 2023-09-28 DIAGNOSIS — R221 Localized swelling, mass and lump, neck: Secondary | ICD-10-CM

## 2023-09-28 DIAGNOSIS — Z9889 Other specified postprocedural states: Secondary | ICD-10-CM

## 2023-09-28 NOTE — Progress Notes (Signed)
 Patient states that he is on blood pressure medication but did not take it this morning. Took BP twice, still elevated.

## 2023-09-28 NOTE — Progress Notes (Signed)
 ENT Progress Note  S/p right posterior neck lipoma excision 2 days ago. Here for wound check and drain removal. Final pathology was consistent with lipoma.   Physical exam: Posterior right neck incision healing well, Penrose removed. Small amount of serosanguinous drainage when area palpated.   A/P Recovering as expected   - RTC 9/8 as scheduled for wound check

## 2023-10-10 ENCOUNTER — Ambulatory Visit (INDEPENDENT_AMBULATORY_CARE_PROVIDER_SITE_OTHER): Admitting: Otolaryngology

## 2023-10-10 ENCOUNTER — Encounter (INDEPENDENT_AMBULATORY_CARE_PROVIDER_SITE_OTHER): Payer: Self-pay | Admitting: Otolaryngology

## 2023-10-10 VITALS — BP 145/88 | HR 90

## 2023-10-10 DIAGNOSIS — D170 Benign lipomatous neoplasm of skin and subcutaneous tissue of head, face and neck: Secondary | ICD-10-CM

## 2023-10-10 DIAGNOSIS — Z9889 Other specified postprocedural states: Secondary | ICD-10-CM

## 2023-10-10 NOTE — Progress Notes (Signed)
 ENT Progress Note  S/p right posterior neck lipoma excision 2 days ago. Here for wound check and drain removal. Final pathology was consistent with lipoma.   Physical exam: Posterior right neck incision healing well No drainage or fluctuance  A/P Recovering as expected   - return for any issues with healing

## 2023-10-10 NOTE — Progress Notes (Signed)
 BP was high on initial take. It went down on second take but still elevated. Patient explains that it has been running higher since surgery. [name] already on BP medication.

## 2023-11-26 ENCOUNTER — Other Ambulatory Visit: Payer: Self-pay | Admitting: Family Medicine

## 2023-12-05 ENCOUNTER — Encounter: Payer: Self-pay | Admitting: Radiology

## 2023-12-21 ENCOUNTER — Other Ambulatory Visit: Payer: Self-pay | Admitting: Physician Assistant

## 2023-12-21 DIAGNOSIS — G8929 Other chronic pain: Secondary | ICD-10-CM

## 2023-12-23 ENCOUNTER — Ambulatory Visit: Payer: Medicare HMO

## 2023-12-23 VITALS — Ht 71.0 in | Wt 197.0 lb

## 2023-12-23 DIAGNOSIS — Z0000 Encounter for general adult medical examination without abnormal findings: Secondary | ICD-10-CM

## 2023-12-23 DIAGNOSIS — Z1211 Encounter for screening for malignant neoplasm of colon: Secondary | ICD-10-CM

## 2023-12-23 NOTE — Patient Instructions (Signed)
 [name],  Thank you for taking the time for your Medicare Wellness Visit. I appreciate your continued commitment to your health goals. Please review the care plan we discussed, and feel free to reach out if I can assist you further.  Please note that Annual Wellness Visits do not include a physical exam. Some assessments may be limited, especially if the visit was conducted virtually. If needed, we may recommend an in-person follow-up with your provider.  Ongoing Care Seeing your primary care provider every 3 to 6 months helps us  monitor your health and provide consistent, personalized care.   Referrals If a referral was made during today's visit and you haven't received any updates within two weeks, please contact the referred provider directly to check on the status.  [facility] at  [address] Phone: [phone]  Recommended Screenings:  Health Maintenance  Topic Date Due   Zoster (Shingles) Vaccine (1 of 2) Never done   Pneumococcal Vaccine for age over 58 (1 of 1 - PCV) Never done   Colon Cancer Screening  07/13/2023   Flu Shot  Never done   COVID-19 Vaccine (4 - 2025-26 season) 10/03/2023   Medicare Annual Wellness Visit  12/17/2023   DTaP/Tdap/Td vaccine (2 - Tdap) 02/02/2024   Hepatitis C Screening  Completed   HIV Screening  Completed   Hepatitis B Vaccine  Aged Out   HPV Vaccine  Aged Out   Meningitis B Vaccine  Aged Out       12/23/2023   12:51 PM  Advanced Directives  Does Patient Have a Medical Advance Directive? Unable to assess, patient is non-responsive or altered mental status    Vision: Annual vision screenings are recommended for early detection of glaucoma, cataracts, and diabetic retinopathy. These exams can also reveal signs of chronic conditions such as diabetes and high blood pressure.  Dental: Annual dental screenings help detect early signs of oral cancer, gum disease, and other conditions  linked to overall health, including heart disease and diabetes.  Please see the attached documents for additional preventive care recommendations.

## 2023-12-23 NOTE — Progress Notes (Signed)
 Chief Complaint  Patient presents with   Medicare Wellness     Subjective:   Christopher [name] is a 63 y.o. male who presents for a Medicare Annual Wellness Visit.  Allergies (verified) Patient has no known allergies.   History: Past Medical History:  Diagnosis Date   Arthritis    Atrial fibrillation (HCC)    a. occuring in the post-operative setting in 02/2017, 03-22-23 had ablation   GERD (gastroesophageal reflux disease)    HTN (hypertension)    Hyperlipemia    Past Surgical History:  Procedure Laterality Date   ATRIAL FIBRILLATION ABLATION N/A 03/22/2023   Procedure: ATRIAL FIBRILLATION ABLATION;  Surgeon: Nancey Eulas BRAVO, MD;  Location: [facility];  Service: Cardiovascular;  Laterality: N/A;   COLONOSCOPY N/A 07/13/2018   Procedure: COLONOSCOPY;  Surgeon: Golda Claudis PENNER, MD;  Location: [facility];  Service: Endoscopy;  Laterality: N/A;  830   EXAM UNDER ANESTHESIA WITH MANIPULATION OF KNEE Right 04/05/2017   Procedure: EXAM UNDER ANESTHESIA WITH MANIPULATION OF KNEE;  Surgeon: Margrette Taft BRAVO, MD;  Location: [facility];  Service: Orthopedics;  Laterality: Right;   EXCISION MASS NECK Right 09/26/2023   Procedure: EXCISION RIGHT POSTERIOR NECK MASS;  Surgeon: Okey Burns, MD;  Location:  SURGERY CENTER;  Service: ENT;  Laterality: Right;  excision of posterior neck lipoma   HERNIA REPAIR Right    KNEE ARTHROSCOPY  right knee   POLYPECTOMY  07/13/2018   Procedure: POLYPECTOMY;  Surgeon: Golda Claudis PENNER, MD;  Location: [facility];  Service: Endoscopy;;   TOTAL KNEE ARTHROPLASTY Right 02/15/2017   Procedure: TOTAL KNEE ARTHROPLASTY;  Surgeon: Margrette Taft BRAVO, MD;  Location: [facility];  Service: Orthopedics;  Laterality: Right;   Family History  Problem Relation Age of Onset   Arthritis Other    Cancer Other    Diabetes Other    Other Mother 18       sepsis   CAD Father 52   CAD Brother 43   Diabetes Brother    Colon cancer Neg Hx     Social History   Occupational History   Occupation: truck Air Traffic Controller: [employer]  Tobacco Use   Smoking status: Never   Smokeless tobacco: Never   Tobacco comments:    Never smoked 04/19/23  Vaping Use   Vaping status: Never Used  Substance and Sexual Activity   Alcohol use: Not Currently    Comment: occasional beer   Drug use: No   Sexual activity: Yes    Birth control/protection: None   Tobacco Counseling Counseling given: Yes Tobacco comments: Never smoked 04/19/23  SDOH Screenings   Food Insecurity: No Food Insecurity (12/23/2023)  Housing: Low Risk  (12/23/2023)  Transportation Needs: No Transportation Needs (12/23/2023)  Utilities: Not At Risk (12/23/2023)  Alcohol Screen: Low Risk  (12/17/2022)  Depression (PHQ2-9): Low Risk  (12/23/2023)  Financial Resource Strain: Low Risk  (12/17/2022)  Physical Activity: Sufficiently Active (12/23/2023)  Social Connections: Moderately Integrated (12/23/2023)  Stress: No Stress Concern Present (12/23/2023)  Tobacco Use: Low Risk  (12/23/2023)  Health Literacy: Adequate Health Literacy (12/23/2023)   See flowsheets for full screening details  Depression Screen PHQ 2 & 9 Depression Scale- Over the past 2 weeks, how often have you been bothered by any of the following problems? Little interest or pleasure in doing things: 0 Feeling down, depressed, or hopeless (PHQ Adolescent also includes...irritable): 0 PHQ-2 Total Score: 0 Trouble falling or staying asleep,  or sleeping too much: 0 Feeling tired or having little energy: 0 Poor appetite or overeating (PHQ Adolescent also includes...weight loss): 0 Feeling bad about yourself - or that you are a failure or have let yourself or your family down: 0 Trouble concentrating on things, such as reading the newspaper or watching television (PHQ Adolescent also includes...like school work): 0 Moving or speaking so slowly that other people could have noticed. Or the  opposite - being so fidgety or restless that you have been moving around a lot more than usual: 0 Thoughts that you would be better off dead, or of hurting yourself in some way: 0 PHQ-9 Total Score: 0 If you checked off any problems, how difficult have these problems made it for you to do your work, take care of things at home, or get along with other people?: Not difficult at all     Goals Addressed               This Visit's Progress     I want to stay healthy (pt-stated)         Visit info / Clinical Intake: Medicare Wellness Visit Type:: Subsequent Annual Wellness Visit Persons participating in visit:: patient Medicare Wellness Visit Mode:: Telephone If telephone:: video declined Because this visit was a virtual/telehealth visit:: pt reported vitals If Telephone or Video please confirm:: I connected with the patient using audio enabled telemedicine application and verified that I am speaking with the correct person using two identifiers; I discussed the limitations of evaluation and management by telemedicine; The patient expressed understanding and agreed to proceed Patient Location:: home Provider Location:: home office Information given by:: patient Interpreter Needed?: No Pre-visit prep was completed: yes AWV questionnaire completed by patient prior to visit?: no Living arrangements:: lives with spouse/significant other Patient's Overall Health Status Rating: good Typical amount of pain: some Does pain affect daily life?: no Are you currently prescribed opioids?: (!) yes  Dietary Habits and Nutritional Risks How many meals a day?: 2 Eats fruit and vegetables daily?: yes Most meals are obtained by: preparing own meals In the last 2 weeks, have you had any of the following?: none Diabetic:: no  Functional Status Activities of Daily Living (to include ambulation/medication): Independent Ambulation: Independent Medication Administration: Independent Home  Management: Independent Manage your own finances?: yes Primary transportation is: driving Concerns about vision?: no *vision screening is required for WTM* Concerns about hearing?: no  Fall Screening Falls in the past year?: 0 Number of falls in past year: 0 Was there an injury with Fall?: 0 Fall Risk Category Calculator: 0 Patient Fall Risk Level: Low Fall Risk  Fall Risk Patient at Risk for Falls Due to: No Fall Risks Fall risk Follow up: Falls evaluation completed; Education provided; Falls prevention discussed  Home and Transportation Safety: All rugs have non-skid backing?: N/A, no rugs All stairs or steps have railings?: yes Grab bars in the bathtub or shower?: yes Have non-skid surface in bathtub or shower?: yes Good home lighting?: yes Regular seat belt use?: yes Hospital stays in the last year:: no  Cognitive Assessment Difficulty concentrating, remembering, or making decisions? : no Will 6CIT or Mini Cog be Completed: no 6CIT or Mini Cog Declined: patient alert, oriented, able to answer questions appropriately and recall recent events  Advance Directives (For Healthcare) Does Patient Have a Medical Advance Directive?: Unable to assess, patient is non-responsive or altered mental status Would patient like information on creating a medical advance directive?: No - Patient declined  Reviewed/Updated  Reviewed/Updated: Reviewed All (Medical, Surgical, Family, Medications, Allergies, Care Teams, Patient Goals)        Objective:    Today's Vitals   12/23/23 1248  Weight: 197 lb (89.4 kg)  Height: 5' 11 (1.803 m)   Body mass index is 27.48 kg/m.  Current Medications (verified) Outpatient Encounter Medications as of 12/23/2023  Medication Sig   acetaminophen  (TYLENOL ) 500 MG tablet Take 1 tablet (500 mg total) by mouth every 6 (six) hours. Take every 6 hrs x 2-3 days then take every 6 hrs as needed   amLODipine  (NORVASC ) 10 MG tablet TAKE 1 TABLET BY MOUTH  EVERY DAY   famotidine  (PEPCID ) 40 MG tablet TAKE 1 TABLET(40 MG) BY MOUTH DAILY   gabapentin  (NEURONTIN ) 100 MG capsule Take 100 mg by mouth 3 (three) times daily.   ibuprofen  (ADVIL ) 600 MG tablet Take 1 tablet (600 mg total) by mouth every 6 (six) hours. Please take every 6 hrs, and stagger this medication 3 hrs apart from Tylenol    lidocaine  (LIDODERM ) 5 % Place 1 patch onto the skin daily. Remove & Discard patch within 12 hours or as directed by MD   meloxicam  (MOBIC ) 15 MG tablet TAKE 1 TABLET(15 MG) BY MOUTH DAILY AS NEEDED FOR RIGHT SHOULDER PAIN OR ARM PAIN   methocarbamol  (ROBAXIN ) 750 MG tablet Take 1 tablet (750 mg total) by mouth 3 (three) times daily.   oxyCODONE  (ROXICODONE ) 5 MG immediate release tablet Take 1 tablet (5 mg total) by mouth every 8 (eight) hours as needed.   rosuvastatin  (CRESTOR ) 20 MG tablet TAKE 1 TABLET(20 MG) BY MOUTH DAILY   [DISCONTINUED] gabapentin  (NEURONTIN ) 300 MG capsule Take 1 capsule (300 mg total) by mouth 3 (three) times daily.   No facility-administered encounter medications on file as of 12/23/2023.   Hearing/Vision screen Hearing Screening - Comments:: Patient denies any hearing difficulties.   Vision Screening - Comments:: Wears rx glasses - up to date with routine eye exams with  Oneil Kawasaki  Immunizations and Health Maintenance Health Maintenance  Topic Date Due   Zoster Vaccines- Shingrix (1 of 2) Never done   Pneumococcal Vaccine: 50+ Years (1 of 1 - PCV) Never done   Colonoscopy  07/13/2023   Influenza Vaccine  Never done   COVID-19 Vaccine (4 - 2025-26 season) 10/03/2023   Medicare Annual Wellness (AWV)  12/17/2023   DTaP/Tdap/Td (2 - Tdap) 02/02/2024   Hepatitis C Screening  Completed   HIV Screening  Completed   Hepatitis B Vaccines 19-59 Average Risk  Aged Out   HPV VACCINES  Aged Out   Meningococcal B Vaccine  Aged Out        Assessment/Plan:  This is a routine wellness examination for Pine.  Patient Care  Team: Alphonsa Glendia LABOR, MD as PCP - General (Family Medicine) Alphonsa Glendia LABOR, MD as Consulting Physician (Family Medicine) Alvan, Dorn FALCON, MD as Consulting Physician (Cardiology) Mealor, Eulas BRAVO, MD as Consulting Physician (Cardiology) Kawasaki Oneil, DO [facility])  I have personally reviewed and noted the following in the patient's chart:   Medical and social history Use of alcohol, tobacco or illicit drugs  Current medications and supplements including opioid prescriptions. Functional ability and status Nutritional status Physical activity Advanced directives List of other physicians Hospitalizations, surgeries, and ER visits in previous 12 months Vitals Screenings to include cognitive, depression, and falls Referrals and appointments  No orders of the defined types were placed in this encounter.  In addition, I have reviewed  and discussed with patient certain preventive protocols, quality metrics, and best practice recommendations. A written personalized care plan for preventive services as well as general preventive health recommendations were provided to patient.   Aniket Paye, CMA   12/23/2023   No follow-ups on file.  After Visit Summary: (MyChart) Due to this being a telephonic visit, the after visit summary with patients personalized plan was offered to patient via MyChart   Nurse Notes: GI referral placed

## 2023-12-27 ENCOUNTER — Encounter (INDEPENDENT_AMBULATORY_CARE_PROVIDER_SITE_OTHER): Payer: Self-pay | Admitting: *Deleted

## 2024-02-01 ENCOUNTER — Other Ambulatory Visit: Payer: Self-pay | Admitting: Family Medicine

## 2024-02-05 ENCOUNTER — Other Ambulatory Visit: Payer: Self-pay | Admitting: Family Medicine

## 2024-02-08 NOTE — Progress Notes (Addendum)
 "  Cardiology Office Note    Date:  02/09/2024  ID:  Christopher [name], DOB January 06, 1961, MRN [id number] Cardiologist: Alvan Carrier, MD { :  History of Present Illness:    Christopher [name] is a 64 y.o. male with past medical history of chest pain (Coronary CT in 06/2016 showed a calcium  score of 0), paroxysmal atrial fibrillation, HTN and HLD who presents to the office today for annual follow-up.  He was examined by myself in 09/2022 and denied any recent palpitations but had been noted to have episodes of atrial fibrillation during his recent ED evaluation. A monitor was obtained and this showed frequent atrial fibrillation with an overall burden of 28% and this was complicated by him having pauses up to 3.2 seconds. He was referred to EP and ultimately underwent ablation by Dr. Nancey in 03/2023. Most recent visit was in 07/2023 and he reported overall feeling well with no recurrent palpitations. Given his CHA2DS2-VASc score of 1 and no evidence of recurrent atrial fibrillation, Eliquis  was discontinued.  In talking with the patient today, he reports overall doing well since his last office visit. He goes to the [facility] several days a week and walks on the treadmill or performs other aerobic activities and also lifts weights.  He denies any recent chest pain or dyspnea on exertion with this. No recent palpitations resembling his prior atrial fibrillation. No specific orthopnea, PND or pitting edema.  Studies Reviewed:   EKG: EKG is not ordered today.  Event Monitor: 09/2022   13 day monitor   Occasional supraventricular ectopy in the form of isolated PACs. Rare couplets, triplets. 7 runs of SVT, longest 1 min 5 seconds.   Occasional ventricular ectopy in the form of isolated PVCs (2.5% burden). Rare couplets, triplets. Isolated 4 beat run of NSVT   28% afib burden, rates ranged from 40 to 217 , avg 98. Minimum heart rates primarily AM hours 7-8AM, possibly while sleeping.   Two pauses longest 3.2  seconds. Both in early AM hours (4-6AM) while in afib, presumably nocturnal pauses.   Triggered events but no symptoms were included. Triggered events correlated with afib with RVR and normal sinus rhythm     Patch Wear Time:  13 days and 8 hours (2024-08-07T15:28:18-0400 to 2024-08-21T00:10:24-0400)   Patient had a min HR of 34 bpm, max HR of 217 bpm, and avg HR of 79 bpm. Predominant underlying rhythm was Sinus Rhythm. 1 run of Ventricular Tachycardia occurred lasting 4 beats with a max rate of 162 bpm (avg 149 bpm). 7 Supraventricular Tachycardia  runs occurred, the run with the fastest interval lasting 4 beats with a max rate of 211 bpm, the longest lasting 1 min 5 secs with an avg rate of 143 bpm. Atrial Fibrillation/Flutter occurred (28% burden), ranging from 40-217 bpm (avg of 94 bpm), the  longest lasting 18 hours 37 mins with an avg rate of 98 bpm. 2 Pauses occurred, the longest lasting 3.2 secs (19 bpm). Atrial Fibrillation/Flutter was detected within +/- 45 seconds of symptomatic patient event(s). Isolated SVEs were occasional (1.4%,  20216), SVE Couplets were rare (<1.0%, 560), and SVE Triplets were rare (<1.0%, 260). Isolated VEs were occasional (2.5%, [id number]), VE Couplets were rare (<1.0%, 1110), and VE Triplets were rare (<1.0%, 2). Ventricular Bigeminy and Trigeminy were present.  MD notification criteria for Rapid Atrial Fibrillation/Atrial Flutter met - report posted prior to notification per account request (TY).   Physical Exam:   VS:  BP 124/76   Pulse  82   Ht 5' 11 (1.803 m)   Wt 196 lb 12.8 oz (89.3 kg)   SpO2 97%   BMI 27.45 kg/m    Wt Readings from Last 3 Encounters:  02/09/24 196 lb 12.8 oz (89.3 kg)  12/23/23 197 lb (89.4 kg)  09/26/23 195 lb 1.7 oz (88.5 kg)     GEN: Pleasant male appearing in no acute distress NECK: No JVD; No carotid bruits CARDIAC: RRR, no murmurs, rubs, gallops RESPIRATORY:  Clear to auscultation without rales, wheezing or rhonchi   ABDOMEN: Appears non-distended. No obvious abdominal masses. EXTREMITIES: No clubbing or cyanosis. No pitting edema.  Distal pedal pulses are 2+ bilaterally.   Assessment and Plan:   1. Paroxysmal atrial fibrillation (HCC) - He previously underwent ablation in 03/2023 and denies any recurrent palpitations resembling his prior atrial fibrillation. [name] no longer on anticoagulation given his CHA2DS2-VASc score of 1 and no known recurrence.  2. Essential hypertension - BP was initially elevated at 136/88, improved to 124/76 on recheck. He reports this has been well-controlled when checked at home. Continue current medical therapy with Amlodipine  10 mg daily.  3. Hyperlipidemia LDL goal <70 - Most recent FLP on file is from 10/2021. He reports he is having follow-up labs with his PCP later this month and we will provide a lab slip for a follow-up FLP. Continue current medical therapy with Crestor  20 mg daily.  Signed, Laymon CHRISTELLA Qua, PA-C   "

## 2024-02-09 ENCOUNTER — Encounter: Payer: Self-pay | Admitting: Student

## 2024-02-09 ENCOUNTER — Ambulatory Visit: Attending: Student | Admitting: Student

## 2024-02-09 VITALS — BP 124/76 | HR 82 | Ht 71.0 in | Wt 196.8 lb

## 2024-02-09 DIAGNOSIS — I480 Paroxysmal atrial fibrillation: Secondary | ICD-10-CM | POA: Diagnosis not present

## 2024-02-09 DIAGNOSIS — I10 Essential (primary) hypertension: Secondary | ICD-10-CM

## 2024-02-09 DIAGNOSIS — E785 Hyperlipidemia, unspecified: Secondary | ICD-10-CM | POA: Diagnosis not present

## 2024-02-09 NOTE — Patient Instructions (Signed)
 Medication Instructions:   Continue current medications.   *If you need a refill on your cardiac medications before your next appointment, please call your pharmacy*  Lab Work:  Lipid Panel when you see Dr. Alphonsa.  If you have labs (blood work) drawn today and your tests are completely normal, you will receive your results only by: MyChart Message (if you have MyChart) OR A paper copy in the mail If you have any lab test that is abnormal or we need to change your treatment, we will call you to review the results.  Follow-Up: At [facility], you and your health needs are our priority.  As part of our continuing mission to provide you with exceptional heart care, our providers are all part of one team.  This team includes your primary Cardiologist (physician) and Advanced Practice Providers or APPs (Physician Assistants and Nurse Practitioners) who all work together to provide you with the care you need, when you need it.  Your next appointment:   1 year(s)  Provider:   You may see Alvan Carrier, MD or one of the following Advanced Practice Providers on your designated Care Team:   Laymon Qua, PA-C  Scotesia Hutchison, [place] Olivia Pavy, [place]     We recommend signing up for the patient portal called MyChart.  Sign up information is provided on this After Visit Summary.  MyChart is used to connect with patients for Virtual Visits (Telemedicine).  Patients are able to view lab/test results, encounter notes, upcoming appointments, etc.  Non-urgent messages can be sent to your provider as well.   To learn more about what you can do with MyChart, go to [email or web address].

## 2024-02-24 ENCOUNTER — Ambulatory Visit: Admitting: Family Medicine

## 2024-02-24 ENCOUNTER — Ambulatory Visit: Payer: Self-pay | Admitting: Student

## 2024-02-24 LAB — LIPID PANEL
Chol/HDL Ratio: 3.4 ratio (ref 0.0–5.0)
Cholesterol, Total: 151 mg/dL (ref 100–199)
HDL: 44 mg/dL
LDL Chol Calc (NIH): 92 mg/dL (ref 0–99)
Triglycerides: 79 mg/dL (ref 0–149)
VLDL Cholesterol Cal: 15 mg/dL (ref 5–40)

## 2024-05-01 ENCOUNTER — Ambulatory Visit: Admitting: Family Medicine

## 2025-01-04 ENCOUNTER — Ambulatory Visit
